# Patient Record
Sex: Male | Born: 1975 | Race: Black or African American | Hispanic: No | Smoking: Current every day smoker
Health system: Southern US, Community
[De-identification: ages and names within clinical notes are randomized; demographics above are authoritative.]

## PROBLEM LIST (undated history)

## (undated) ENCOUNTER — Telehealth

## (undated) ENCOUNTER — Telehealth: Attending: Family | Primary: Family

## (undated) ENCOUNTER — Encounter

## (undated) ENCOUNTER — Ambulatory Visit: Payer: MEDICARE

## (undated) ENCOUNTER — Ambulatory Visit

## (undated) ENCOUNTER — Encounter: Payer: MEDICAID | Attending: Family | Primary: Family

## (undated) ENCOUNTER — Encounter: Attending: Family | Primary: Family

## (undated) ENCOUNTER — Encounter: Attending: Internal Medicine | Primary: Internal Medicine

## (undated) ENCOUNTER — Inpatient Hospital Stay

## (undated) ENCOUNTER — Telehealth
Attending: Student in an Organized Health Care Education/Training Program | Primary: Student in an Organized Health Care Education/Training Program

## (undated) ENCOUNTER — Ambulatory Visit
Payer: MEDICAID | Attending: Student in an Organized Health Care Education/Training Program | Primary: Student in an Organized Health Care Education/Training Program

## (undated) ENCOUNTER — Ambulatory Visit: Payer: MEDICAID

## (undated) ENCOUNTER — Ambulatory Visit: Payer: MEDICARE | Attending: Family | Primary: Family

## (undated) ENCOUNTER — Telehealth: Attending: Registered" | Primary: Registered"

## (undated) ENCOUNTER — Ambulatory Visit: Payer: MEDICARE | Attending: Cardiovascular Disease | Primary: Cardiovascular Disease

## (undated) ENCOUNTER — Other Ambulatory Visit

## (undated) ENCOUNTER — Telehealth
Attending: Pharmacist Clinician (PhC)/ Clinical Pharmacy Specialist | Primary: Pharmacist Clinician (PhC)/ Clinical Pharmacy Specialist

## (undated) ENCOUNTER — Ambulatory Visit: Payer: MEDICAID | Attending: Pharmacist | Primary: Pharmacist

## (undated) ENCOUNTER — Telehealth: Attending: Internal Medicine | Primary: Internal Medicine

## (undated) ENCOUNTER — Ambulatory Visit: Payer: MEDICARE | Attending: Nurse Practitioner | Primary: Nurse Practitioner

## (undated) ENCOUNTER — Telehealth: Attending: Clinical | Primary: Clinical

## (undated) ENCOUNTER — Encounter: Payer: PRIVATE HEALTH INSURANCE | Attending: Family | Primary: Family

## (undated) ENCOUNTER — Ambulatory Visit: Payer: MEDICAID | Attending: Pharmacotherapy | Primary: Pharmacotherapy

## (undated) ENCOUNTER — Ambulatory Visit: Payer: MEDICAID | Attending: Family | Primary: Family

## (undated) ENCOUNTER — Encounter
Attending: Pharmacist Clinician (PhC)/ Clinical Pharmacy Specialist | Primary: Pharmacist Clinician (PhC)/ Clinical Pharmacy Specialist

## (undated) ENCOUNTER — Encounter
Payer: MEDICAID | Attending: Student in an Organized Health Care Education/Training Program | Primary: Student in an Organized Health Care Education/Training Program

## (undated) ENCOUNTER — Ambulatory Visit: Payer: MEDICARE | Attending: Internal Medicine | Primary: Internal Medicine

## (undated) ENCOUNTER — Ambulatory Visit
Payer: MEDICARE | Attending: Rehabilitative and Restorative Service Providers" | Primary: Rehabilitative and Restorative Service Providers"

## (undated) ENCOUNTER — Encounter: Attending: Cardiovascular Disease | Primary: Cardiovascular Disease

## (undated) DIAGNOSIS — B20 Human immunodeficiency virus [HIV] disease: Secondary | ICD-10-CM

## (undated) DIAGNOSIS — Z21 Asymptomatic human immunodeficiency virus [HIV] infection status: Secondary | ICD-10-CM

## (undated) DIAGNOSIS — I639 Cerebral infarction, unspecified: Secondary | ICD-10-CM

## (undated) DIAGNOSIS — I358 Other nonrheumatic aortic valve disorders: Secondary | ICD-10-CM

## (undated) DIAGNOSIS — Z952 Presence of prosthetic heart valve: Secondary | ICD-10-CM

## (undated) HISTORY — PX: PACEMAKER IMPLANT: EP1218

## (undated) HISTORY — PX: CARDIAC VALVE SURGERY: SHX40

## (undated) MED ORDER — ASPIRIN 81 MG TABLET,DELAYED RELEASE: Freq: Every day | ORAL | 0.00000 days

## (undated) MED ORDER — TIVICAY 50 MG TABLET: 50 mg | tablet | Freq: Two times a day (BID) | 2 refills | 30 days | Status: CN

## (undated) MED ORDER — TIVICAY 50 MG TABLET: 50 mg | tablet | Freq: Every day | 2 refills | 60 days | Status: CN

---

## 2018-01-22 DIAGNOSIS — I442 Atrioventricular block, complete: Secondary | ICD-10-CM

## 2018-01-22 HISTORY — DX: Atrioventricular block, complete: I44.2

## 2018-09-01 ENCOUNTER — Ambulatory Visit: Admit: 2018-09-01 | Discharge: 2018-09-01 | Disposition: A | Discharge status: Home / Self Care | Payer: MEDICAID

## 2018-09-01 MED ORDER — METHOCARBAMOL 500 MG TABLET
ORAL_TABLET | Freq: Two times a day (BID) | ORAL | 0 refills | 10 days | Status: CP | PRN
Start: 2018-09-01 — End: ?

## 2018-09-01 MED ORDER — NAPROXEN 500 MG TABLET
ORAL_TABLET | Freq: Two times a day (BID) | ORAL | 0 refills | 10 days | Status: CP
Start: 2018-09-01 — End: 2018-09-11

## 2019-05-09 ENCOUNTER — Inpatient Hospital Stay
Admission: EM | Admit: 2019-05-09 | Discharge: 2019-05-25 | DRG: 314 | Disposition: A | Discharge status: Other Acute Inpatient Hospital | Payer: Medicaid Other | Attending: Family Medicine | Admitting: Family Medicine

## 2019-05-09 ENCOUNTER — Emergency Department: Payer: Medicaid Other

## 2019-05-09 ENCOUNTER — Other Ambulatory Visit: Payer: Self-pay

## 2019-05-09 ENCOUNTER — Ambulatory Visit
Admit: 2019-05-09 | Discharge: 2019-05-09 | Disposition: A | Discharge status: Home / Self Care | Payer: MEDICAID | Attending: Family Medicine

## 2019-05-09 DIAGNOSIS — Z8619 Personal history of other infectious and parasitic diseases: Secondary | ICD-10-CM | POA: Diagnosis not present

## 2019-05-09 DIAGNOSIS — N179 Acute kidney failure, unspecified: Secondary | ICD-10-CM | POA: Diagnosis present

## 2019-05-09 DIAGNOSIS — D72825 Bandemia: Secondary | ICD-10-CM

## 2019-05-09 DIAGNOSIS — R7401 Elevation of levels of liver transaminase levels: Secondary | ICD-10-CM | POA: Diagnosis present

## 2019-05-09 DIAGNOSIS — Z21 Asymptomatic human immunodeficiency virus [HIV] infection status: Secondary | ICD-10-CM | POA: Diagnosis present

## 2019-05-09 DIAGNOSIS — Z818 Family history of other mental and behavioral disorders: Secondary | ICD-10-CM

## 2019-05-09 DIAGNOSIS — F17210 Nicotine dependence, cigarettes, uncomplicated: Secondary | ICD-10-CM | POA: Diagnosis present

## 2019-05-09 DIAGNOSIS — B9561 Methicillin susceptible Staphylococcus aureus infection as the cause of diseases classified elsewhere: Secondary | ICD-10-CM | POA: Diagnosis not present

## 2019-05-09 DIAGNOSIS — A630 Anogenital (venereal) warts: Secondary | ICD-10-CM | POA: Diagnosis present

## 2019-05-09 DIAGNOSIS — Z9114 Patient's other noncompliance with medication regimen: Secondary | ICD-10-CM | POA: Diagnosis not present

## 2019-05-09 DIAGNOSIS — F419 Anxiety disorder, unspecified: Secondary | ICD-10-CM | POA: Diagnosis present

## 2019-05-09 DIAGNOSIS — Z8661 Personal history of infections of the central nervous system: Secondary | ICD-10-CM

## 2019-05-09 DIAGNOSIS — R778 Other specified abnormalities of plasma proteins: Secondary | ICD-10-CM | POA: Diagnosis present

## 2019-05-09 DIAGNOSIS — A419 Sepsis, unspecified organism: Secondary | ICD-10-CM | POA: Diagnosis present

## 2019-05-09 DIAGNOSIS — Y831 Surgical operation with implant of artificial internal device as the cause of abnormal reaction of the patient, or of later complication, without mention of misadventure at the time of the procedure: Secondary | ICD-10-CM | POA: Diagnosis present

## 2019-05-09 DIAGNOSIS — I959 Hypotension, unspecified: Secondary | ICD-10-CM | POA: Diagnosis not present

## 2019-05-09 DIAGNOSIS — R011 Cardiac murmur, unspecified: Secondary | ICD-10-CM | POA: Diagnosis not present

## 2019-05-09 DIAGNOSIS — R7881 Bacteremia: Secondary | ICD-10-CM | POA: Diagnosis present

## 2019-05-09 DIAGNOSIS — Z950 Presence of cardiac pacemaker: Secondary | ICD-10-CM | POA: Diagnosis not present

## 2019-05-09 DIAGNOSIS — Z20822 Contact with and (suspected) exposure to covid-19: Secondary | ICD-10-CM | POA: Diagnosis present

## 2019-05-09 DIAGNOSIS — E871 Hypo-osmolality and hyponatremia: Secondary | ICD-10-CM | POA: Diagnosis present

## 2019-05-09 DIAGNOSIS — A4101 Sepsis due to Methicillin susceptible Staphylococcus aureus: Secondary | ICD-10-CM | POA: Diagnosis present

## 2019-05-09 DIAGNOSIS — F4321 Adjustment disorder with depressed mood: Secondary | ICD-10-CM | POA: Diagnosis present

## 2019-05-09 DIAGNOSIS — T826XXA Infection and inflammatory reaction due to cardiac valve prosthesis, initial encounter: Principal | ICD-10-CM | POA: Diagnosis present

## 2019-05-09 DIAGNOSIS — F1490 Cocaine use, unspecified, uncomplicated: Secondary | ICD-10-CM | POA: Diagnosis present

## 2019-05-09 DIAGNOSIS — Z9889 Other specified postprocedural states: Secondary | ICD-10-CM | POA: Diagnosis not present

## 2019-05-09 DIAGNOSIS — I77810 Thoracic aortic ectasia: Secondary | ICD-10-CM | POA: Diagnosis present

## 2019-05-09 DIAGNOSIS — I5022 Chronic systolic (congestive) heart failure: Secondary | ICD-10-CM | POA: Diagnosis present

## 2019-05-09 DIAGNOSIS — F329 Major depressive disorder, single episode, unspecified: Secondary | ICD-10-CM | POA: Diagnosis not present

## 2019-05-09 DIAGNOSIS — B20 Human immunodeficiency virus [HIV] disease: Secondary | ICD-10-CM | POA: Diagnosis not present

## 2019-05-09 DIAGNOSIS — I38 Endocarditis, valve unspecified: Secondary | ICD-10-CM | POA: Diagnosis present

## 2019-05-09 DIAGNOSIS — R748 Abnormal levels of other serum enzymes: Secondary | ICD-10-CM | POA: Diagnosis present

## 2019-05-09 DIAGNOSIS — R6520 Severe sepsis without septic shock: Secondary | ICD-10-CM

## 2019-05-09 DIAGNOSIS — D72829 Elevated white blood cell count, unspecified: Secondary | ICD-10-CM | POA: Diagnosis present

## 2019-05-09 DIAGNOSIS — Z8679 Personal history of other diseases of the circulatory system: Secondary | ICD-10-CM | POA: Diagnosis not present

## 2019-05-09 DIAGNOSIS — I330 Acute and subacute infective endocarditis: Secondary | ICD-10-CM | POA: Diagnosis present

## 2019-05-09 DIAGNOSIS — R6521 Severe sepsis with septic shock: Secondary | ICD-10-CM | POA: Diagnosis present

## 2019-05-09 DIAGNOSIS — Z8249 Family history of ischemic heart disease and other diseases of the circulatory system: Secondary | ICD-10-CM

## 2019-05-09 DIAGNOSIS — Z8673 Personal history of transient ischemic attack (TIA), and cerebral infarction without residual deficits: Secondary | ICD-10-CM

## 2019-05-09 DIAGNOSIS — Z952 Presence of prosthetic heart valve: Secondary | ICD-10-CM | POA: Diagnosis not present

## 2019-05-09 DIAGNOSIS — F17200 Nicotine dependence, unspecified, uncomplicated: Secondary | ICD-10-CM | POA: Diagnosis not present

## 2019-05-09 HISTORY — DX: Cerebral infarction, unspecified: I63.9

## 2019-05-09 LAB — URINALYSIS, COMPLETE (UACMP) WITH MICROSCOPIC
Bilirubin Urine: NEGATIVE
Glucose, UA: NEGATIVE mg/dL
Hgb urine dipstick: NEGATIVE
Ketones, ur: NEGATIVE mg/dL
Leukocytes,Ua: NEGATIVE
Nitrite: NEGATIVE
Protein, ur: NEGATIVE mg/dL
Specific Gravity, Urine: 1.009 (ref 1.005–1.030)
pH: 7 (ref 5.0–8.0)

## 2019-05-09 LAB — BASIC METABOLIC PANEL
Anion gap: 10 (ref 5–15)
BUN: 18 mg/dL (ref 6–20)
CO2: 18 mmol/L — ABNORMAL LOW (ref 22–32)
Calcium: 9 mg/dL (ref 8.9–10.3)
Chloride: 103 mmol/L (ref 98–111)
Creatinine, Ser: 1.86 mg/dL — ABNORMAL HIGH (ref 0.61–1.24)
GFR calc Af Amer: 51 mL/min — ABNORMAL LOW (ref >60)
GFR calc non Af Amer: 44 mL/min — ABNORMAL LOW (ref >60)
Glucose, Bld: 121 mg/dL — ABNORMAL HIGH (ref 70–99)
Potassium: 4.3 mmol/L (ref 3.5–5.1)
Sodium: 131 mmol/L — ABNORMAL LOW (ref 135–145)

## 2019-05-09 LAB — CBC
HCT: 42.1 % (ref 39.0–52.0)
Hemoglobin: 13.9 g/dL (ref 13.0–17.0)
MCH: 29.2 pg (ref 26.0–34.0)
MCHC: 33 g/dL (ref 30.0–36.0)
MCV: 88.4 fL (ref 80.0–100.0)
Platelets: 200 10*3/uL (ref 150–400)
RBC: 4.76 MIL/uL (ref 4.22–5.81)
RDW: 11.9 % (ref 11.5–15.5)
WBC: 15.8 10*3/uL — ABNORMAL HIGH (ref 4.0–10.5)
nRBC: 0 % (ref 0.0–0.2)

## 2019-05-09 LAB — COMPREHENSIVE METABOLIC PANEL
ALT: 47 U/L — ABNORMAL HIGH (ref 0–44)
AST: 43 U/L — ABNORMAL HIGH (ref 15–41)
Albumin: 3 g/dL — ABNORMAL LOW (ref 3.5–5.0)
Alkaline Phosphatase: 60 U/L (ref 38–126)
Anion gap: 5 (ref 5–15)
BUN: 16 mg/dL (ref 6–20)
CO2: 20 mmol/L — ABNORMAL LOW (ref 22–32)
Calcium: 7.4 mg/dL — ABNORMAL LOW (ref 8.9–10.3)
Chloride: 110 mmol/L (ref 98–111)
Creatinine, Ser: 1.69 mg/dL — ABNORMAL HIGH (ref 0.61–1.24)
GFR calc Af Amer: 57 mL/min — ABNORMAL LOW (ref >60)
GFR calc non Af Amer: 49 mL/min — ABNORMAL LOW (ref >60)
Glucose, Bld: 114 mg/dL — ABNORMAL HIGH (ref 70–99)
Potassium: 4.1 mmol/L (ref 3.5–5.1)
Sodium: 135 mmol/L (ref 135–145)
Total Bilirubin: 1 mg/dL (ref 0.3–1.2)
Total Protein: 6.4 g/dL — ABNORMAL LOW (ref 6.5–8.1)

## 2019-05-09 LAB — TROPONIN I (HIGH SENSITIVITY)
Troponin I (High Sensitivity): 19 ng/L — ABNORMAL HIGH (ref <18)
Troponin I (High Sensitivity): 14 ng/L (ref <18)

## 2019-05-09 LAB — RESPIRATORY PANEL BY RT PCR (FLU A&B, COVID)
Influenza A by PCR: NEGATIVE
Influenza B by PCR: NEGATIVE
SARS Coronavirus 2 by RT PCR: NEGATIVE

## 2019-05-09 LAB — LACTIC ACID, PLASMA: Lactic Acid, Venous: 1.3 mmol/L (ref 0.5–1.9)

## 2019-05-09 LAB — PROCALCITONIN: Procalcitonin: 17.78 ng/mL

## 2019-05-09 MED ORDER — VANCOMYCIN HCL 2000 MG/400ML IV SOLN
2000.0000 mg | Freq: Once | INTRAVENOUS | Status: AC
  Administered 2019-05-09: 2000 mg via INTRAVENOUS
  Filled 2019-05-09: qty 400

## 2019-05-09 MED ORDER — HEPARIN SODIUM (PORCINE) 5000 UNIT/ML IJ SOLN: 5000.0000 [IU] | Freq: Three times a day (TID) | INTRAMUSCULAR | Status: DC

## 2019-05-09 MED ORDER — ACETAMINOPHEN 325 MG PO TABS
650.0000 mg | ORAL_TABLET | Freq: Four times a day (QID) | ORAL | Status: DC | PRN
  Administered 2019-05-09 – 2019-05-23 (×16): 650 mg via ORAL
  Filled 2019-05-09 (×17): qty 2

## 2019-05-09 MED ORDER — SODIUM CHLORIDE 0.9 % IV SOLN
2.0000 g | Freq: Once | INTRAVENOUS | Status: AC
  Administered 2019-05-09: 2 g via INTRAVENOUS
  Filled 2019-05-09: qty 2

## 2019-05-09 MED ORDER — ONDANSETRON HCL 4 MG/2ML IJ SOLN: 4.0000 mg | Freq: Four times a day (QID) | INTRAMUSCULAR | Status: DC | PRN

## 2019-05-09 MED ORDER — LACTATED RINGERS IV SOLN: INTRAVENOUS | Status: DC

## 2019-05-09 MED ORDER — ONDANSETRON HCL 4 MG PO TABS: 4.0000 mg | ORAL_TABLET | Freq: Four times a day (QID) | ORAL | Status: DC | PRN

## 2019-05-09 MED ORDER — SODIUM CHLORIDE 0.9 % IV SOLN
1000.0000 mL | INTRAVENOUS | Status: DC
  Administered 2019-05-09: 15:00:00 1000 mL via INTRAVENOUS

## 2019-05-09 MED ORDER — LOPERAMIDE HCL 1 MG/5ML PO LIQD: 2.0000 mg | ORAL | 5 refills | Status: DC | PRN

## 2019-05-09 MED ORDER — SODIUM CHLORIDE 0.9 % IV SOLN: 2.0000 g | Freq: Three times a day (TID) | INTRAVENOUS | Status: DC

## 2019-05-09 MED ORDER — VANCOMYCIN HCL 1750 MG/350ML IV SOLN: 1750.0000 mg | INTRAVENOUS | Status: DC

## 2019-05-09 MED ORDER — HEPARIN SODIUM (PORCINE) 5000 UNIT/ML IJ SOLN
5000.0000 [IU] | Freq: Three times a day (TID) | INTRAMUSCULAR | Status: DC
  Administered 2019-05-09 – 2019-05-12 (×7): 5000 [IU] via SUBCUTANEOUS
  Filled 2019-05-09 (×10): qty 1

## 2019-05-09 MED ORDER — ACETAMINOPHEN 650 MG RE SUPP: 650.0000 mg | Freq: Four times a day (QID) | RECTAL | Status: DC | PRN

## 2019-05-09 MED ORDER — SODIUM CHLORIDE 0.9 % IV BOLUS (SEPSIS)
1000.0000 mL | Freq: Once | INTRAVENOUS | Status: AC
  Administered 2019-05-09: 1000 mL via INTRAVENOUS

## 2019-05-09 NOTE — Plan of Care (Signed)
Patient admitted to floor, alert and oriented x4. Oriented to room, POC, and call bell and placed within reach.  Patient verbalized understanding, family at bedside.

## 2019-05-09 NOTE — Progress Notes (Signed)
Pharmacy Antibiotic Note  James Ferrell is a 44 y.o. male admitted on 05/09/2019 with sepsis.  Pharmacy has been consulted for Vancomycin, Cefepime dosing.  Plan: Cefepime 2 gm IV X 1 ordered for 4/17 @ 1422. Cefepime 2 gm IV Q8H ordered to start on 4/17 @ 2200.   Vancomycin 2 gm IV X 1 ordered to be given on 4/17 @ ~ 2000. Vancomycin 1750 mg IV Q24H @ 4/18 @ 2000.   Vd = 60.4 L Ke = 0.055 hr-1 T1/2 = 12.5 hrs AUC = 524.3 Vanc trough = 11.7   Height: 5\' 11"  (180.3 cm) Weight: 83.9 kg (185 lb) IBW/kg (Calculated) : 75.3  Temp (24hrs), Avg:98.7 F (37.1 C), Min:98.7 F (37.1 C), Max:98.7 F (37.1 C)  Recent Labs  Lab 05/09/19 1309 05/09/19 1332 05/09/19 1600  WBC 15.8*  --   --   CREATININE 1.86*  --  1.69*  LATICACIDVEN  --  1.3  --     Estimated Creatinine Clearance: 61.3 mL/min (A) (by C-G formula based on SCr of 1.69 mg/dL (H)).    No Known Allergies  Antimicrobials this admission:   >>    >>   Dose adjustments this admission:   Microbiology results:  BCx:   UCx:    Sputum:    MRSA PCR:   Thank you for allowing pharmacy to be a part of this patient's care.  Bahar Shelden D 05/09/2019 6:48 PM

## 2019-05-09 NOTE — ED Provider Notes (Signed)
Procedures     ----------------------------------------- 4:49 PM on 05/09/2019 -----------------------------------------  Sepsis reassessment has been completed after 52ml/kg fluid bolus infused.    BP 115/55, MAP 70. HR 110. Hemodynamics improved, no signs of shock currently. Vasopressors not indicated at present time.     Sharman Cheek, MD 05/09/19 1650

## 2019-05-09 NOTE — ED Notes (Signed)
Report given to Marion, RN.

## 2019-05-09 NOTE — H&P (Addendum)
History and Physical:    James Ferrell   LKG:[id number] DOB: Jan 05, 1976 DOA: 05/09/2019  Referring MD/provider:  PCP: Patient, No Pcp Per   Patient coming from: Home  Chief Complaint: Fever and chills  History of Present Illness:   James Ferrell is an 44 y.o. male  with medical history significant for stroke (occured during during heart valve surgery) and heart valve surgery (does not know which valve) who presented to the hospital with fever and chills.  His symptoms started few days ago but has progressively worsened.  He developed generalized weakness and dizziness and he felt as though he was going to fall down.  He said he was also exposed to some cleaning chemicals yesterday when he tried to do some cleaning in the bathroom.  He went to [facility] today where his temperature was 103 in the emergency department.  However, because of long waiting times in the emergency department at [facility], he came to [facility] for further evaluation.  No vomiting, diarrhea, abdominal pain, urinary symptoms, cough, shortness of breath, chest pain, wheezing, sore throat.  No animal, insect or tick bites.  He said he was living in [place], [place] recently located to [facility]  ED Course:  The patient was tachypneic, tachycardic and hypotensive in the emergency room.  BP was 87/46 and heart rate was in the 120s.  Temperature was normal in the ED.  He was given liters of normal saline and IV cefepime in the emergency room.  BP has improved a little bit after IV fluids.  Chest x-ray did not show any acute abnormality and urinalysis was unremarkable.  WBC is 15.8 but lactic acid was normal     ROS:   ROS all other systems reviewed were negative  Past Medical History:   Past Medical History:  Diagnosis Date  . Stroke [facility])     Past Surgical History:   Past Surgical History:  Procedure Laterality Date  . CARDIAC VALVE SURGERY      Social History:    Social History   Socioeconomic History  . Marital status: Significant Other    Spouse name: Not on file  . Number of children: Not on file  . Years of education: Not on file  . Highest education level: Not on file  Occupational History  . Not on file  Tobacco Use  . Smoking status: Current Every Day Smoker  . Smokeless tobacco: Never Used  Substance and Sexual Activity  . Alcohol use: Yes  . Drug use: Not on file  . Sexual activity: Not on file  Other Topics Concern  . Not on file  Social History Narrative  . Not on file   Social Determinants of Health   Financial Resource Strain:   . Difficulty of Paying Living Expenses:   Food Insecurity:   . Worried About Charity fundraiser in the Last Year:   . Arboriculturist in the Last Year:   Transportation Needs:   . Film/video editor (Medical):   Marland Kitchen Lack of Transportation (Non-Medical):   Physical Activity:   . Days of Exercise per Week:   . Minutes of Exercise per Session:   Stress:   . Feeling of Stress :   Social Connections:   . Frequency of Communication with Friends and Family:   . Frequency of Social Gatherings with Friends and Family:   . Attends Religious Services:   . Active Member of Clubs or Organizations:   .  Attends Banker Meetings:   Marland Kitchen Marital Status:   Intimate Partner Violence:   . Fear of Current or Ex-Partner:   . Emotionally Abused:   Marland Kitchen Physically Abused:   . Sexually Abused:     Allergies   Patient has no known allergies.  Family history:   Family History  Problem Relation Age of Onset  . Heart disease Neg Hx     Current Medications:   Prior to Admission medications   Not on File    Physical Exam:   Vitals:   05/09/19 1600 05/09/19 1630 05/09/19 1710 05/09/19 1730  BP:  (!) 115/53  (!) 117/45  Pulse:      Resp: (!) 23 (!) 27 (!) 32   Temp:      TempSrc:      SpO2:      Weight:      Height:         Physical Exam: Blood pressure (!) 117/45, pulse  (!) 120, temperature 98.7 F (37.1 C), temperature source Oral, resp. rate (!) 32, height 5\' 11"  (1.803 m), weight 83.9 kg, SpO2 95 %. Gen: No acute distress but he is shivering. Head: Normocephalic, atraumatic. Eyes: Pupils equal, round and reactive to light. Extraocular movements intact.  Sclerae nonicteric.  Mouth: Moist mucous membranes. Dentition is good.  Torus on roof of the mouth Neck: Supple, no thyromegaly, no lymphadenopathy, no jugular venous distention. Chest: Lungs are clear to auscultation with good air movement. No rales, rhonchi or wheezes.  CV: Heart sounds are regular with an S1, S2. No murmurs, rubs or gallops.  Abdomen: Soft, nontender, nondistended with normal active bowel sounds. No palpable masses. Extremities: Extremities are without clubbing, or cyanosis. No edema. Pedal pulses 2+.  Skin: Warm and dry. No rashes, lesions or wounds.  He has a surgical scar on the midline chest. Neuro: Alert and oriented times 3; grossly nonfocal.  Psych: Insight is good and judgment is appropriate. Mood and affect normal.   Data Review:    Labs: Basic Metabolic Panel: Recent Labs  Lab 05/09/19 1309 05/09/19 1600  NA 131* 135  K 4.3 4.1  CL 103 110  CO2 18* 20*  GLUCOSE 121* 114*  BUN 18 16  CREATININE 1.86* 1.69*  CALCIUM 9.0 7.4*   Liver Function Tests: Recent Labs  Lab 05/09/19 1600  AST 43*  ALT 47*  ALKPHOS 60  BILITOT 1.0  PROT 6.4*  ALBUMIN 3.0*   No results for input(s): LIPASE, AMYLASE in the last 168 hours. No results for input(s): AMMONIA in the last 168 hours. CBC: Recent Labs  Lab 05/09/19 1309  WBC 15.8*  HGB 13.9  HCT 42.1  MCV 88.4  PLT 200   Cardiac Enzymes: No results for input(s): CKTOTAL, CKMB, CKMBINDEX, TROPONINI in the last 168 hours.  BNP (last 3 results) No results for input(s): PROBNP in the last 8760 hours. CBG: No results for input(s): GLUCAP in the last 168 hours.  Urinalysis    Component Value Date/Time    COLORURINE YELLOW (A) 05/09/2019 1516   APPEARANCEUR CLEAR (A) 05/09/2019 1516   LABSPEC 1.009 05/09/2019 1516   PHURINE 7.0 05/09/2019 1516   GLUCOSEU NEGATIVE 05/09/2019 1516   HGBUR NEGATIVE 05/09/2019 1516   BILIRUBINUR NEGATIVE 05/09/2019 1516   KETONESUR NEGATIVE 05/09/2019 1516   PROTEINUR NEGATIVE 05/09/2019 1516   NITRITE NEGATIVE 05/09/2019 1516   LEUKOCYTESUR NEGATIVE 05/09/2019 1516      Radiographic Studies: DG Chest 1 View  Result Date: 05/09/2019  CLINICAL DATA:  Fever, weakness. EXAM: CHEST  1 VIEW COMPARISON:  None. FINDINGS: The heart size and mediastinal contours are within normal limits. No pneumothorax or pleural effusion is noted. Left-sided pacemaker is noted. Sternotomy wires are noted. Both lungs are clear. The visualized skeletal structures are unremarkable. IMPRESSION: No active disease. Electronically Signed   By: Lupita Raider M.D.   On: 05/09/2019 14:44      Assessment/Plan:   Principal Problem:   Sepsis (HCC) Active Problems:   Leukocytosis   Hypotension   AKI (acute kidney injury) (HCC)  Sepsis/leukocytosis: Admit to MedSurg with cardiac monitoring.  Etiology of sepsis is unclear at this time.  Chest x-ray and urinalysis were unremarkable.  Obtain CT scan of the chest, abdomen and pelvis to look for occult infection when creatinine improves.  Check procalcitonin level.  Tylenol as needed for fever.  Continue IV fluids for hydration.  Treat with empiric IV antibiotics (cefepime and vancomycin).  Consulted pharmacist to assist with dosing of antibiotics.  Follow-up blood cultures.  Acute kidney injury: Continue IV fluids.  Baseline creatinine unknown.  Monitor kidney function.  HypOtension: Continue IV fluids.  Hyponatremia: Improving.  Continue IV fluids.  Mildly elevated liver enzymes: Monitor levels closely.  History of stroke: Patient is not on any medications for this  History of heart valve surgery  Body mass index is 25.8  kg/m.  Other information:   DVT prophylaxis: Heparin Code Status: Full code. Family Communication: Plan discussed with significant daughter, Tammy Disposition Plan: Home when medically stable Consults called: None Admission status: Inpatient  The medical decision making on this patient was of high complexity and the patient is at high risk for clinical deterioration, therefore this is a level 3 visit.   Time spent 72 minutes  Ammi Hutt [facility]   How to contact the [facility] Attending or Consulting provider 7A - 7P or covering provider during after hours 7P -7A, for this patient?   1. Check the care team in [facility] and look for a) attending/consulting TRH provider listed and b) the [facility] team listed 2. Log into [email or web address] and use [place]'s universal password to access. If you do not have the password, please contact the hospital operator. 3. Locate the [facility] provider you are looking for under [facility] and page to a number that you can be directly reached. 4. If you still have difficulty reaching the provider, please page the [facility] (Director on Call) for the Hospitalists listed on amion for assistance.  05/09/2019, 5:55 PM

## 2019-05-09 NOTE — ED Triage Notes (Signed)
Pt c/o fatigue and weakness since last night. hypotensive and tachycardic in triage. First RN stated pt was having trouble walking in lobby, placed in wheelchair.A&O, speaking in complete sentences. Stroke February 2020. States was waiting [facility] since [place] today.

## 2019-05-09 NOTE — ED Provider Notes (Signed)
[facility] Emergency Department Provider Note  ____________________________________________  Time seen: Approximately 1:21 PM  I have reviewed the triage vital signs and the nursing notes.   HISTORY  Chief Complaint Weakness    HPI James Ferrell is a 44 y.o. male who presents the emergency department complaining of weakness, dizziness, possible fever.  Patient states that over the past several days he has noticed that he possibly was running a low-grade subjective tactile fever.  Patient denied any other symptoms to accompanying this sensation however.  He denies any headache, visual changes, nasal congestion, sore throat, cough, shortness of breath, abdominal pain, nausea vomiting, diarrhea or constipation.  No dysuria, polyuria or hematuria.  Patient states that his main concern is he was cleaning his tub yesterday he started to feel lightheaded and dizzy.  Patient initially thought he had inhaled some of the chemicals and that thinking of it.  This morning he awoke with increased dizziness and weakness.  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  Patient denies any residual weakness from same.  No new onset of focal weakness unilaterally.  Again no headaches, visual changes.  No head trauma.  No medications prior to arrival.   Patient states that he went to a [facility] affiliated ED earlier today.  Patient had 103.55F temperature.  Patient states that given the wait time he left that emergency department proceeded to our emergency department without evaluation from formal ED.        Past Medical History:  Diagnosis Date  . Stroke [facility])     Patient Active Problem List   Diagnosis Date Noted  . Sepsis (HCC) 05/09/2019    History reviewed. No pertinent surgical history.  Prior to Admission medications   Not on File    Allergies Patient has no known allergies.  History reviewed. No pertinent family  history.  Social History Social History   Tobacco Use  . Smoking status: Current Every Day Smoker  Substance Use Topics  . Alcohol use: Yes  . Drug use: Not on file     Review of Systems  Constitutional: Subjective, tactile fever/chills.  Positive for weakness and dizziness today. Eyes: No visual changes. No discharge ENT: No upper respiratory complaints. Cardiovascular: no chest pain. Respiratory: no cough. No SOB. Gastrointestinal: No abdominal pain.  No nausea, no vomiting.  No diarrhea.  No constipation. Genitourinary: Negative for dysuria. No hematuria Musculoskeletal: Negative for musculoskeletal pain. Skin: Negative for rash, abrasions, lacerations, ecchymosis. Neurological: Negative for headaches, focal weakness or numbness. 10-point ROS otherwise negative.  ____________________________________________   PHYSICAL EXAM:  VITAL SIGNS: ED Triage Vitals  Enc Vitals Group     BP 05/09/19 1304 (!) 87/46     Pulse Rate 05/09/19 1304 (!) 120     Resp 05/09/19 1304 18     Temp 05/09/19 1304 98.7 F (37.1 C)     Temp Source 05/09/19 1304 Oral     SpO2 05/09/19 1304 95 %     Weight 05/09/19 1305 185 lb (83.9 kg)     Height 05/09/19 1305 5\' 11"  (1.803 m)     Head Circumference --      Peak Flow --      Pain Score 05/09/19 1304 0     Pain Loc --      Pain Edu? --      Excl. in GC? --      Constitutional: Alert and oriented. Well appearing  and in no acute distress. Eyes: Conjunctivae are normal. PERRL. EOMI. Head: Atraumatic. ENT:      Ears: EACs and TMs unremarkable bilaterally.      Nose: Minimal clear congestion/rhinnorhea.      Mouth/Throat: Mucous membranes are moist.  No gross oropharyngeal erythema or edema Neck: No stridor.  Neck is supple full range of motion. Hematological/Lymphatic/Immunilogical: No cervical lymphadenopathy. Cardiovascular: Normal rate, regular rhythm. Normal S1 and S2.  Good peripheral circulation. Respiratory: Normal respiratory  effort without tachypnea or retractions. Lungs CTAB. Good air entry to the bases with no decreased or absent breath sounds. Gastrointestinal: Bowel sounds 4 quadrants. Soft and nontender to palpation. No guarding or rigidity. No palpable masses. No distention. No CVA tenderness. Musculoskeletal: Full range of motion to all extremities. No gross deformities appreciated. Neurologic:  Normal speech and language. No gross focal neurologic deficits are appreciated.  Skin:  Skin is warm, dry and intact. No rash noted. Psychiatric: Mood and affect are normal. Speech and behavior are normal. Patient exhibits appropriate insight and judgement.   ____________________________________________   LABS (all labs ordered are listed, but only abnormal results are displayed)  Labs Reviewed  BASIC METABOLIC PANEL - Abnormal; Notable for the following components:      Result Value   Sodium 131 (*)    CO2 18 (*)    Glucose, Bld 121 (*)    Creatinine, Ser 1.86 (*)    GFR calc non Af Amer 44 (*)    GFR calc Af Amer 51 (*)    All other components within normal limits  CBC - Abnormal; Notable for the following components:   WBC 15.8 (*)    All other components within normal limits  URINALYSIS, COMPLETE (UACMP) WITH MICROSCOPIC - Abnormal; Notable for the following components:   Color, Urine YELLOW (*)    APPearance CLEAR (*)    Bacteria, UA RARE (*)    All other components within normal limits  COMPREHENSIVE METABOLIC PANEL - Abnormal; Notable for the following components:   CO2 20 (*)    Glucose, Bld 114 (*)    Creatinine, Ser 1.69 (*)    Calcium 7.4 (*)    Total Protein 6.4 (*)    Albumin 3.0 (*)    AST 43 (*)    ALT 47 (*)    GFR calc non Af Amer 49 (*)    GFR calc Af Amer 57 (*)    All other components within normal limits  TROPONIN I (HIGH SENSITIVITY) - Abnormal; Notable for the following components:   Troponin I (High Sensitivity) 19 (*)    All other components within normal limits   RESPIRATORY PANEL BY RT PCR (FLU A&B, COVID)  CULTURE, BLOOD (ROUTINE X 2)  CULTURE, BLOOD (ROUTINE X 2)  LACTIC ACID, PLASMA  PROCALCITONIN  PROCALCITONIN  CBG MONITORING, ED  TROPONIN I (HIGH SENSITIVITY)   ____________________________________________  EKG   ____________________________________________  RADIOLOGY I personally viewed and evaluated these images as part of my medical decision making, as well as reviewing the written report by the radiologist.  DG Chest 1 View  Result Date: 05/09/2019 CLINICAL DATA:  Fever, weakness. EXAM: CHEST  1 VIEW COMPARISON:  None. FINDINGS: The heart size and mediastinal contours are within normal limits. No pneumothorax or pleural effusion is noted. Left-sided pacemaker is noted. Sternotomy wires are noted. Both lungs are clear. The visualized skeletal structures are unremarkable. IMPRESSION: No active disease. Electronically Signed   By: Lupita Raider M.D.   On:  05/09/2019 14:44    ____________________________________________    PROCEDURES  Procedure(s) performed:    Procedures    Medications  sodium chloride 0.9 % bolus 1,000 mL (0 mLs Intravenous Stopped 05/09/19 1601)    Followed by  sodium chloride 0.9 % bolus 1,000 mL (0 mLs Intravenous Stopped 05/09/19 1510)    Followed by  sodium chloride 0.9 % bolus 1,000 mL (0 mLs Intravenous Stopped 05/09/19 1515)    Followed by  0.9 %  sodium chloride infusion (1,000 mLs Intravenous New Bag/Given 05/09/19 1516)  ceFEPIme (MAXIPIME) 2 g in sodium chloride 0.9 % 100 mL IVPB (0 g Intravenous Stopped 05/09/19 1510)     ____________________________________________   INITIAL IMPRESSION / ASSESSMENT AND PLAN / ED COURSE  Pertinent labs & imaging results that were available during my care of the patient were reviewed by me and considered in my medical decision making (see chart for details).  Review of the [place] CSRS was performed in accordance of the [place] prior to dispensing any  controlled drugs.    ED Sepsis - Repeat Assessment   Performed at: 05/09/2019 1455  Last Vitals:    Blood pressure 102/65, pulse (!) 120, temperature 98.7 F (37.1 C), temperature source Oral, resp. rate (!) 22, height 5\' 11"  (1.803 m), weight 83.9 kg, SpO2 95 %.  Heart:      No murmurs, rubs, gallops  Lungs:     CTAB  Capillary Refill:   <2sec  Peripheral Pulse (include location): 110 radial   Skin (include color):   WNL, no erythema        Patient's diagnosis is consistent with sepsis of unknown origin, elevated troponin.  Patient presented to the emergency department complaining of fever, weakness, dizziness.  Patient states that he had had a subjective tactile fever at home times several days.  Patient had not taken his temperature and did not take any medications for same either.  Yesterday he was cleaning his bathtub, thought that he had been exposed to cleaning chemicals for too long as he started to feel increasing he weak and dizzy.  Symptoms were worse this morning and patient initially presented to [facility] for evaluation.  Temperature there was 103 F.  Given the wait time patient left and presented to our emergency department.  Patient's temperature was within normal range on arrival but patient was hypotensive, tachycardic, tachypneic.  Initial white blood cell count was 15.8.  Lactic was reassuring..  Patient had no other specific symptoms to include URI symptoms, cough, shortness of breath, abdominal pain or dysuria.  Work-up is otherwise relatively reassuring.  Patient does have a history of heart valve surgery and during the surgery he did have a stroke.  Patient surgery was in [place] and I am unable to obtain the medical records from [place].  At this time patient did have an elevated troponin which did improve on redraw.  I will admit the patient for sepsis of unknown origin.  Hospital service accepts the patient at this time.   .     ____________________________________________  FINAL CLINICAL IMPRESSION(S) / ED DIAGNOSES  Final diagnoses:  Sepsis with acute organ dysfunction and septic shock, due to unspecified organism, unspecified type ([place])  Elevated troponin           This chart was dictated using voice recognition software/Dragon. Despite best efforts to proofread, errors can occur which can change the meaning. Any change was purely unintentional.    Darletta Moll, PA-C 05/09/19 1713  Jene Every, MD 05/10/19 1630

## 2019-05-10 DIAGNOSIS — R7881 Bacteremia: Secondary | ICD-10-CM | POA: Diagnosis present

## 2019-05-10 DIAGNOSIS — B9561 Methicillin susceptible Staphylococcus aureus infection as the cause of diseases classified elsewhere: Secondary | ICD-10-CM

## 2019-05-10 DIAGNOSIS — A4101 Sepsis due to Methicillin susceptible Staphylococcus aureus: Secondary | ICD-10-CM

## 2019-05-10 LAB — BLOOD CULTURE ID PANEL (REFLEXED)

## 2019-05-10 LAB — COMPREHENSIVE METABOLIC PANEL
ALT: 60 U/L — ABNORMAL HIGH (ref 0–44)
AST: 58 U/L — ABNORMAL HIGH (ref 15–41)
Albumin: 3.2 g/dL — ABNORMAL LOW (ref 3.5–5.0)
Alkaline Phosphatase: 65 U/L (ref 38–126)
Anion gap: 5 (ref 5–15)
BUN: 13 mg/dL (ref 6–20)
CO2: 21 mmol/L — ABNORMAL LOW (ref 22–32)
Calcium: 8.3 mg/dL — ABNORMAL LOW (ref 8.9–10.3)
Chloride: 109 mmol/L (ref 98–111)
Creatinine, Ser: 1.38 mg/dL — ABNORMAL HIGH (ref 0.61–1.24)
GFR calc Af Amer: >60 mL/min (ref >60)
GFR calc non Af Amer: >60 mL/min (ref >60)
Glucose, Bld: 116 mg/dL — ABNORMAL HIGH (ref 70–99)
Potassium: 3.6 mmol/L (ref 3.5–5.1)
Sodium: 135 mmol/L (ref 135–145)
Total Bilirubin: 1.6 mg/dL — ABNORMAL HIGH (ref 0.3–1.2)
Total Protein: 7.1 g/dL (ref 6.5–8.1)

## 2019-05-10 LAB — CBC
HCT: 37.9 % — ABNORMAL LOW (ref 39.0–52.0)
Hemoglobin: 12.4 g/dL — ABNORMAL LOW (ref 13.0–17.0)
MCH: 29.5 pg (ref 26.0–34.0)
MCHC: 32.7 g/dL (ref 30.0–36.0)
MCV: 90.2 fL (ref 80.0–100.0)
Platelets: 156 10*3/uL (ref 150–400)
RBC: 4.2 MIL/uL — ABNORMAL LOW (ref 4.22–5.81)
RDW: 12.3 % (ref 11.5–15.5)
WBC: 9.9 10*3/uL (ref 4.0–10.5)
nRBC: 0 % (ref 0.0–0.2)

## 2019-05-10 LAB — PROCALCITONIN: Procalcitonin: 21.77 ng/mL

## 2019-05-10 LAB — HIV ANTIBODY (ROUTINE TESTING W REFLEX): HIV Screen 4th Generation wRfx: REACTIVE — AB

## 2019-05-10 MED ORDER — CEFAZOLIN SODIUM-DEXTROSE 2-4 GM/100ML-% IV SOLN
2.0000 g | Freq: Three times a day (TID) | INTRAVENOUS | Status: DC
  Administered 2019-05-10 – 2019-05-17 (×22): 2 g via INTRAVENOUS
  Filled 2019-05-10 (×27): qty 100

## 2019-05-10 NOTE — Progress Notes (Signed)
Pharmacy Antibiotic Note  James Ferrell is a 44 y.o. male admitted on 05/09/2019 with sepsis and bacteremia.  Pharmacy has been consulted for Ancef  Plan: Ancef 2gm IV q8hrs  Height: 5\' 11"  (180.3 cm) Weight: 83.9 kg (185 lb) IBW/kg (Calculated) : 75.3  Temp (24hrs), Avg:100.4 F (38 C), Min:98.7 F (37.1 C), Max:103 F (39.4 C)  Recent Labs  Lab 05/09/19 1309 05/09/19 1332 05/09/19 1600  WBC 15.8*  --   --   CREATININE 1.86*  --  1.69*  LATICACIDVEN  --  1.3  --     Estimated Creatinine Clearance: 61.3 mL/min (A) (by C-G formula based on SCr of 1.69 mg/dL (H)).    No Known Allergies  Antimicrobials this admission:   >>    >>   Dose adjustments this admission:   Microbiology results:  BCx:   UCx:    Sputum:    MRSA PCR:   Thank you for allowing pharmacy to be a part of this patient's care.  05/11/19 A 05/10/2019 1:23 AM

## 2019-05-10 NOTE — Progress Notes (Addendum)
Progress Note    [name] Sunday  HYQ:[id number] DOB: 04/06/1975  DOA: 05/09/2019 PCP: Patient, No Pcp Per      Brief Narrative:    Medical records reviewed and are as summarized below:  James Ferrell is an 44 y.o. male with medical history significant for stroke (occured during during heart valve surgery) and heart valve surgery (does not know which valve) who presented to the hospital with fever and chills.  His symptoms started few days ago prior to admission but had progressively worsened.  He developed generalized weakness and dizziness and he felt as though he was going to fall down.  He said he was also exposed to some cleaning chemicals on the day prior to admission when he tried to do some cleaning in the bathroom.  He went to [facility] on the day of admission where his temperature was 103 F in the emergency department.  However, because of long waiting times in the emergency department at [facility], he came to [facility] for further evaluation. He was tachycardic and hypotensive in the emergency room and was given boluses of IV fluids.  He was initially diagnosed with sepsis of unknown etiology.  Blood culture grew MSSA.      Assessment/Plan:   Principal Problem:   Sepsis ([place]) Active Problems:   Bacteremia due to Staphylococcus aureus   Hypotension   AKI (acute kidney injury) ([place])   Leukocytosis   MSSA sepsis and bacteremia/leukocytosis: Antibiotics have been deescalated to IV Ancef.  2D echocardiogram has been ordered for further evaluation.  Tylenol as needed for fever.  Official ID consult tomorrow.  Acute kidney injury: Improving.  Continue IV fluids.  Hypotension: Resolved  Hyponatremia: Improved.  Mildly elevated liver enzymes: Liver enzymes are slightly worse today.  Continue to monitor.  History of stroke: Patient is not on any long-term medications for this  History of heart valve surgery: Specifics unknown.   2D echo has been ordered.    Body mass index is 25.8 kg/m.   Family Communication/Anticipated D/C date and plan/Code Status   DVT prophylaxis: Lovenox Code Status: Full code Family Communication: Plan discussed with patient Disposition Plan:    Status is: Inpatient  Remains inpatient appropriate because:IV treatments appropriate due to intensity of illness or inability to take PO and Inpatient level of care appropriate due to severity of illness   Dispo: The patient is from: Home              Anticipated d/c is to: Home              Anticipated d/c date is: > 3 days              Patient currently is not medically stable to d/c.            Subjective:   He still has a fever but chills have improved.  Overall, he feels a little better today.  Objective:    Vitals:   05/10/19 0406 05/10/19 0423 05/10/19 0528 05/10/19 0934  BP: 95/74  115/85 103/70  Pulse: (!) 118 (!) 109 (!) 111 (!) 104  Resp: 20 (!) 22 20 20   Temp: (!) 101 F (38.3 C)  (!) 101.4 F (38.6 C) 98.9 F (37.2 C)  TempSrc: Oral  Oral Oral  SpO2: 99%  97% 97%  Weight:      Height:        Intake/Output Summary (Last 24 hours) at 05/10/2019 1441 Last data  filed at 05/10/2019 3710 Gross per 24 hour  Intake 2257.05 ml  Output --  Net 2257.05 ml   Filed Weights   05/09/19 1305  Weight: 83.9 kg    Exam:  GEN: NAD SKIN: No rash EYES: EOMI ENT: MMM CV: RRR PULM: CTA B ABD: soft, ND, NT, +BS CNS: AAO x 3, non focal EXT: No edema or tenderness   Data Reviewed:   I have personally reviewed following labs and imaging studies:  Labs: Labs show the following:   Basic Metabolic Panel: Recent Labs  Lab 05/09/19 1309 05/09/19 1309 05/09/19 1600 05/10/19 0533  NA 131*  --  135 135  K 4.3   < > 4.1 3.6  CL 103  --  110 109  CO2 18*  --  20* 21*  GLUCOSE 121*  --  114* 116*  BUN 18  --  16 13  CREATININE 1.86*  --  1.69* 1.38*  CALCIUM 9.0  --  7.4* 8.3*   < > = values in  this interval not displayed.   GFR Estimated Creatinine Clearance: 75 mL/min (A) (by C-G formula based on SCr of 1.38 mg/dL (H)). Liver Function Tests: Recent Labs  Lab 05/09/19 1600 05/10/19 0533  AST 43* 58*  ALT 47* 60*  ALKPHOS 60 65  BILITOT 1.0 1.6*  PROT 6.4* 7.1  ALBUMIN 3.0* 3.2*   No results for input(s): LIPASE, AMYLASE in the last 168 hours. No results for input(s): AMMONIA in the last 168 hours. Coagulation profile No results for input(s): INR, PROTIME in the last 168 hours.  CBC: Recent Labs  Lab 05/09/19 1309 05/10/19 0533  WBC 15.8* 9.9  HGB 13.9 12.4*  HCT 42.1 37.9*  MCV 88.4 90.2  PLT 200 156   Cardiac Enzymes: No results for input(s): CKTOTAL, CKMB, CKMBINDEX, TROPONINI in the last 168 hours. BNP (last 3 results) No results for input(s): PROBNP in the last 8760 hours. CBG: No results for input(s): GLUCAP in the last 168 hours. D-Dimer: No results for input(s): DDIMER in the last 72 hours. Hgb A1c: No results for input(s): HGBA1C in the last 72 hours. Lipid Profile: No results for input(s): CHOL, HDL, LDLCALC, TRIG, CHOLHDL, LDLDIRECT in the last 72 hours. Thyroid function studies: No results for input(s): TSH, T4TOTAL, T3FREE, THYROIDAB in the last 72 hours.  Invalid input(s): FREET3 Anemia work up: No results for input(s): VITAMINB12, FOLATE, FERRITIN, TIBC, IRON, RETICCTPCT in the last 72 hours. Sepsis Labs: Recent Labs  Lab 05/09/19 1309 05/09/19 1332 05/09/19 1600 05/10/19 0533  PROCALCITON  --   --  17.78 21.77  WBC 15.8*  --   --  9.9  LATICACIDVEN  --  1.3  --   --     Microbiology Recent Results (from the past 240 hour(s))  Culture, blood (routine x 2)     Status: None (Preliminary result)   Collection Time: 05/09/19  1:10 PM   Specimen: BLOOD  Result Value Ref Range Status   Specimen Description BLOOD RFA  Final   Special Requests   Final    BOTTLES DRAWN AEROBIC AND ANAEROBIC Blood Culture results may not be optimal  due to an excessive volume of blood received in culture bottles   Culture  Setup Time   Final    Organism ID to follow IN BOTH AEROBIC AND ANAEROBIC BOTTLES GRAM POSITIVE COCCI CRITICAL RESULT CALLED TO, READ BACK BY AND VERIFIED WITH: SCOTT HALL ON 05/10/19 AT 0052 [facility] Performed at [facility], [address] [place], [place] [address]    Culture GRAM POSITIVE COCCI  Final   Report Status PENDING  Incomplete  Blood Culture ID Panel (Reflexed)     Status: Abnormal   Collection Time: 05/09/19  1:10 PM  Result Value Ref Range Status   Enterococcus species NOT DETECTED NOT DETECTED Final   Listeria monocytogenes NOT DETECTED NOT DETECTED Final   Staphylococcus species DETECTED (A) NOT DETECTED Final    Comment: CRITICAL RESULT CALLED TO, READ BACK BY AND VERIFIED WITH: SCOTT HALL ON 05/10/19 AT 0052 [facility]    Staphylococcus aureus (BCID) DETECTED (A) NOT DETECTED Final    Comment: Methicillin (oxacillin) susceptible Staphylococcus aureus (MSSA). Preferred therapy is anti staphylococcal beta lactam antibiotic (Cefazolin or [name]), unless clinically contraindicated. CRITICAL RESULT CALLED TO, READ BACK BY AND VERIFIED WITH: SCOTT HALL ON 05/10/19 AT 0052 [facility]    Methicillin resistance NOT DETECTED NOT DETECTED Final   Streptococcus species NOT DETECTED NOT DETECTED Final   Streptococcus agalactiae NOT DETECTED NOT DETECTED Final   Streptococcus pneumoniae NOT DETECTED NOT DETECTED Final   Streptococcus pyogenes NOT DETECTED NOT DETECTED Final   Acinetobacter baumannii NOT DETECTED NOT DETECTED Final   Enterobacteriaceae species NOT DETECTED NOT DETECTED Final   Enterobacter cloacae complex NOT DETECTED NOT DETECTED Final   Escherichia coli NOT DETECTED NOT DETECTED Final   Klebsiella oxytoca NOT DETECTED NOT DETECTED Final   Klebsiella pneumoniae NOT DETECTED NOT DETECTED Final   Proteus species NOT DETECTED NOT DETECTED Final   Serratia marcescens NOT DETECTED NOT DETECTED  Final   Haemophilus influenzae NOT DETECTED NOT DETECTED Final   Neisseria meningitidis NOT DETECTED NOT DETECTED Final   Pseudomonas aeruginosa NOT DETECTED NOT DETECTED Final   Candida albicans NOT DETECTED NOT DETECTED Final   Candida glabrata NOT DETECTED NOT DETECTED Final   Candida krusei NOT DETECTED NOT DETECTED Final   Candida parapsilosis NOT DETECTED NOT DETECTED Final   Candida tropicalis NOT DETECTED NOT DETECTED Final    Comment: Performed at [facility], [address] [place], [place] [address]  Culture, blood (routine x 2)     Status: None (Preliminary result)   Collection Time: 05/09/19  1:52 PM   Specimen: BLOOD  Result Value Ref Range Status   Specimen Description BLOOD BLOOD LEFT FOREARM  Final   Special Requests   Final    BOTTLES DRAWN AEROBIC AND ANAEROBIC Blood Culture adequate volume   Culture  Setup Time   Final    IN BOTH AEROBIC AND ANAEROBIC BOTTLES GRAM POSITIVE COCCI CRITICAL VALUE NOTED.  VALUE IS CONSISTENT WITH PREVIOUSLY REPORTED AND CALLED VALUE. Performed at [facility], [address] [place], [place] [address]    Culture [facility] POSITIVE COCCI  Final   Report Status PENDING  Incomplete  Respiratory Panel by RT PCR (Flu A&B, Covid) - Nasopharyngeal Swab     Status: None   Collection Time: 05/09/19  3:11 PM   Specimen: Nasopharyngeal Swab  Result Value Ref Range Status   SARS Coronavirus 2 by RT PCR NEGATIVE NEGATIVE Final    Comment: (NOTE) SARS-CoV-2 target nucleic acids are NOT DETECTED. The SARS-CoV-2 RNA is generally detectable in upper respiratoy specimens during the acute phase of infection. The lowest concentration of SARS-CoV-2 viral copies this assay can detect is 131 copies/mL. A negative result does not preclude SARS-Cov-2 infection and should not be used as the sole basis for treatment or other patient management decisions. A negative result may occur with  improper specimen collection/handling, submission  of specimen other than nasopharyngeal swab, presence of viral mutation(s) within the areas targeted by this assay, and inadequate number of viral copies (<131 copies/mL). A negative result must be combined with clinical observations, patient history, and epidemiological information. The expected result is Negative. Fact Sheet for Patients:  https://www.moore.com/ Fact Sheet for Healthcare Providers:  https://www.young.biz/ This test is not yet ap proved or cleared by the [place] FDA and  has been authorized for detection and/or diagnosis of SARS-CoV-2 by FDA under an Emergency Use Authorization (EUA). This EUA will remain  in effect (meaning this test can be used) for the duration of the COVID-19 declaration under Section 564(b)(1) of the Act, 21 U.S.C. section 360bbb-3(b)(1), unless the authorization is terminated or revoked sooner.    Influenza A by PCR NEGATIVE NEGATIVE Final   Influenza B by PCR NEGATIVE NEGATIVE Final    Comment: (NOTE) The Xpert Xpress SARS-CoV-2/FLU/RSV assay is intended as an aid in  the diagnosis of influenza from Nasopharyngeal swab specimens and  should not be used as a sole basis for treatment. Nasal washings and  aspirates are unacceptable for Xpert Xpress SARS-CoV-2/FLU/RSV  testing. Fact Sheet for Patients: https://www.moore.com/ Fact Sheet for Healthcare Providers: https://www.young.biz/ This test is not yet approved or cleared by the [place] FDA and  has been authorized for detection and/or diagnosis of SARS-CoV-2 by  FDA under an Emergency Use Authorization (EUA). This EUA will remain  in effect (meaning this test can be used) for the duration of the  Covid-19 declaration under Section 564(b)(1) of the Act, 21  U.S.C. section 360bbb-3(b)(1), unless the authorization is  terminated or revoked. Performed at [facility], [address]  [place], [place] [address]     Procedures and diagnostic studies:  DG Chest 1 View  Result Date: 05/09/2019 CLINICAL DATA:  Fever, weakness. EXAM: CHEST  1 VIEW COMPARISON:  None. FINDINGS: The heart size and mediastinal contours are within normal limits. No pneumothorax or pleural effusion is noted. Left-sided pacemaker is noted. Sternotomy wires are noted. Both lungs are clear. The visualized skeletal structures are unremarkable. IMPRESSION: No active disease. Electronically Signed   By: Lupita Raider M.D.   On: 05/09/2019 14:44    Medications:   . heparin  5,000 Units Subcutaneous Q8H   Continuous Infusions: .  ceFAZolin (ANCEF) IV 2 g (05/10/19 1136)  . lactated ringers 75 mL/hr at 05/10/19 0400     LOS: 1 day   Willy Vorce  [facility]     05/10/2019, 2:41 PM

## 2019-05-10 NOTE — Consult Note (Signed)
Patient with positive MSSA bacteremia with an unknown cardiac surgery history.   Now on Ancef appropriately and TTE ordered. Formal consult tomorrow.  Gardiner Barefoot, MD

## 2019-05-10 NOTE — Progress Notes (Signed)
PHARMACY - PHYSICIAN COMMUNICATION CRITICAL VALUE ALERT - BLOOD CULTURE IDENTIFICATION (BCID)  James Ferrell is an 44 y.o. male who presented to [facility] on 05/09/2019 with a chief complaint of sepsis  Assessment:  Lab reports 4 of 4 bottles w/ GPC, staph aureus, MecA not detected  Name of physician (or Provider) Contacted: Reyes Ivan, NP  Current antibiotics: Cefepime and Vancomycin  Changes to prescribed antibiotics recommended: change to Ancef 2gm IV q8hrs Recommendations accepted by provider  Results for orders placed or performed during the hospital encounter of 05/09/19  Blood Culture ID Panel (Reflexed) (Collected: 05/09/2019  1:10 PM)  Result Value Ref Range   Enterococcus species NOT DETECTED NOT DETECTED   Listeria monocytogenes NOT DETECTED NOT DETECTED   Staphylococcus species DETECTED (A) NOT DETECTED   Staphylococcus aureus (BCID) DETECTED (A) NOT DETECTED   Methicillin resistance NOT DETECTED NOT DETECTED   Streptococcus species NOT DETECTED NOT DETECTED   Streptococcus agalactiae NOT DETECTED NOT DETECTED   Streptococcus pneumoniae NOT DETECTED NOT DETECTED   Streptococcus pyogenes NOT DETECTED NOT DETECTED   Acinetobacter baumannii NOT DETECTED NOT DETECTED   Enterobacteriaceae species NOT DETECTED NOT DETECTED   Enterobacter cloacae complex NOT DETECTED NOT DETECTED   Escherichia coli NOT DETECTED NOT DETECTED   Klebsiella oxytoca NOT DETECTED NOT DETECTED   Klebsiella pneumoniae NOT DETECTED NOT DETECTED   Proteus species NOT DETECTED NOT DETECTED   Serratia marcescens NOT DETECTED NOT DETECTED   Haemophilus influenzae NOT DETECTED NOT DETECTED   Neisseria meningitidis NOT DETECTED NOT DETECTED   Pseudomonas aeruginosa NOT DETECTED NOT DETECTED   Candida albicans NOT DETECTED NOT DETECTED   Candida glabrata NOT DETECTED NOT DETECTED   Candida krusei NOT DETECTED NOT DETECTED   Candida parapsilosis NOT DETECTED NOT DETECTED   Candida tropicalis NOT DETECTED  NOT DETECTED    Valrie Hart A 05/10/2019  1:08 AM

## 2019-05-11 ENCOUNTER — Inpatient Hospital Stay (HOSPITAL_COMMUNITY)
Admit: 2019-05-11 | Discharge: 2019-05-11 | Disposition: A | Discharge status: Home / Self Care | Payer: Medicaid Other | Attending: Internal Medicine | Admitting: Internal Medicine

## 2019-05-11 DIAGNOSIS — R7401 Elevation of levels of liver transaminase levels: Secondary | ICD-10-CM

## 2019-05-11 DIAGNOSIS — Z8673 Personal history of transient ischemic attack (TIA), and cerebral infarction without residual deficits: Secondary | ICD-10-CM

## 2019-05-11 DIAGNOSIS — F17200 Nicotine dependence, unspecified, uncomplicated: Secondary | ICD-10-CM

## 2019-05-11 DIAGNOSIS — Z952 Presence of prosthetic heart valve: Secondary | ICD-10-CM

## 2019-05-11 DIAGNOSIS — R7881 Bacteremia: Secondary | ICD-10-CM

## 2019-05-11 DIAGNOSIS — Z950 Presence of cardiac pacemaker: Secondary | ICD-10-CM

## 2019-05-11 DIAGNOSIS — N179 Acute kidney failure, unspecified: Secondary | ICD-10-CM

## 2019-05-11 DIAGNOSIS — R011 Cardiac murmur, unspecified: Secondary | ICD-10-CM

## 2019-05-11 DIAGNOSIS — Z21 Asymptomatic human immunodeficiency virus [HIV] infection status: Secondary | ICD-10-CM

## 2019-05-11 DIAGNOSIS — Z8619 Personal history of other infectious and parasitic diseases: Secondary | ICD-10-CM

## 2019-05-11 LAB — HIV-1/2 AB - DIFFERENTIATION
HIV 1 Ab: POSITIVE — AB
HIV 2 Ab: NEGATIVE

## 2019-05-11 LAB — COMPREHENSIVE METABOLIC PANEL
ALT: 61 U/L — ABNORMAL HIGH (ref 0–44)
AST: 61 U/L — ABNORMAL HIGH (ref 15–41)
Albumin: 3 g/dL — ABNORMAL LOW (ref 3.5–5.0)
Alkaline Phosphatase: 67 U/L (ref 38–126)
Anion gap: 9 (ref 5–15)
BUN: 11 mg/dL (ref 6–20)
CO2: 21 mmol/L — ABNORMAL LOW (ref 22–32)
Calcium: 8.7 mg/dL — ABNORMAL LOW (ref 8.9–10.3)
Chloride: 105 mmol/L (ref 98–111)
Creatinine, Ser: 1.22 mg/dL (ref 0.61–1.24)
GFR calc Af Amer: >60 mL/min (ref >60)
GFR calc non Af Amer: >60 mL/min (ref >60)
Glucose, Bld: 103 mg/dL — ABNORMAL HIGH (ref 70–99)
Potassium: 3.8 mmol/L (ref 3.5–5.1)
Sodium: 135 mmol/L (ref 135–145)
Total Bilirubin: 1.6 mg/dL — ABNORMAL HIGH (ref 0.3–1.2)
Total Protein: 7.2 g/dL (ref 6.5–8.1)

## 2019-05-11 LAB — PROCALCITONIN: Procalcitonin: 13.97 ng/mL

## 2019-05-11 LAB — ECHOCARDIOGRAM COMPLETE
Height: 71 in
Weight: 2960 oz

## 2019-05-11 NOTE — Progress Notes (Signed)
*  PRELIMINARY RESULTS* Echocardiogram 2D Echocardiogram has been performed.  Cristela Blue 05/11/2019, 2:28 PM

## 2019-05-11 NOTE — Consult Note (Signed)
NAME: Sacha Radloff  DOB: [birth year]  MRN: [id number]  Date/Time: 05/11/2019 9:38 AM  REQUESTING PROVIDER: Dr. Myriam Forehand Subjective:  REASON FOR CONSULT: Staph aureus bacteremia.  ?pt is a poor historian Atzel Mccambridge is a 44 y.o. male with a history of CVA, HIV, Pacemaker,  presented to the ED with fatigue and weakness for a few days. Pt moved to [place] from [facility] a year ago and has not established care here in [place] Patient presented on 4/17/2021with weakness, fatigue and in the ED his heart rate was 120s, blood pressure was 87/46. WBC was 15.8 and lactate was normal.  Chest x-ray did not show any acute abnormality.  Blood cultures were sent.  Started on IV fluids and given IV cefepime and vancomycin. Blood culture came back as staph aureus.  His antibiotics was changed to cefazolin and I am seeing the patient for the same.. Patient has a history of cardiac valve surgery and had developed a stroke while having the surgery. He used to live in [place] and just moved to [place] recently. On further questioning patient says he has HIV and is followed by Dr. Lorin Mercy in [place].  He says he has been on a Atripla.  He does not know his last viral load or CD4 count. He last saw Dr. Starr Lake PA in May 2020. He is not aware why he had a heart surgery.  He remembers living with his sister and had a syncopal episode and was taken to the hospital and had to undergo heart surgery and assess heart rate was low he also had a pacemaker placed. Patient does snort cocaine but denies IV drug use. Past Medical History:  Diagnosis Date  . Stroke [facility] [address])     Past Surgical History:  Procedure Laterality Date  . CARDIAC VALVE SURGERY    Pacemaker  Social history Current daily smoker Drinks beer 3 cans/day No IV drug use Occasionally snorts cocaine  Family history Diabetes mellitus mom's sister Schizophrenia mother Coronary artery disease-dad's brother    ? Current Facility-Administered  Medications  Medication Dose Route Frequency Provider Last Rate Last Admin  . acetaminophen (TYLENOL) tablet 650 mg  650 mg Oral Q6H PRN Lurene Shadow, MD   650 mg at 05/10/19 2230   Or  . acetaminophen (TYLENOL) suppository 650 mg  650 mg Rectal Q6H PRN Lurene Shadow, MD      . ceFAZolin (ANCEF) IVPB 2g/100 mL premix  2 g Intravenous Q8H Hall, Scott A, RPH 200 mL/hr at 05/11/19 0417 2 g at 05/11/19 0417  . heparin injection 5,000 Units  5,000 Units Subcutaneous Q8H Lurene Shadow, MD   5,000 Units at 05/11/19 0506  . ondansetron (ZOFRAN) tablet 4 mg  4 mg Oral Q6H PRN Lurene Shadow, MD       Or  . ondansetron [facility]) injection 4 mg  4 mg Intravenous Q6H PRN Lurene Shadow, MD         Abtx:  Anti-infectives (From admission, onward)   Start     Dose/Rate Route Frequency Ordered Stop   05/10/19 2200  ceFEPIme (MAXIPIME) 2 g in sodium chloride 0.9 % 100 mL IVPB  Status:  Discontinued     2 g 200 mL/hr over 30 Minutes Intravenous Every 8 hours 05/09/19 1840 05/10/19 0121   05/10/19 2000  vancomycin (VANCOREADY) IVPB 1750 mg/350 mL  Status:  Discontinued     1,750 mg 175 mL/hr over 120 Minutes Intravenous Every 24 hours 05/09/19 1847 05/10/19 0121   05/10/19 0400  ceFAZolin (ANCEF) IVPB 2g/100 mL premix     2 g 200 mL/hr over 30 Minutes Intravenous Every 8 hours 05/10/19 0123     05/09/19 1845  vancomycin (VANCOREADY) IVPB 2000 mg/400 mL     2,000 mg 200 mL/hr over 120 Minutes Intravenous  Once 05/09/19 1840 05/10/19 0700   05/09/19 1345  ceFEPIme (MAXIPIME) 2 g in sodium chloride 0.9 % 100 mL IVPB     2 g 200 mL/hr over 30 Minutes Intravenous  Once 05/09/19 1340 05/09/19 1510      REVIEW OF SYSTEMS:  Const: Patient did not check his temperature.  He complains of fatigue and generalized weakness  eyes: negative diplopia or visual changes, negative eye pain ENT: negative coryza, negative sore throat Resp: negative cough, hemoptysis, dyspnea Cards: negative for chest pain,  palpitations, lower extremity edema GU: negative for frequency, dysuria and hematuria GI: Negative for abdominal pain, diarrhea, bleeding, constipation Skin: negative for rash and pruritus Heme: negative for easy bruising and gum/nose bleeding MS: Generalized weakness  Neurolo:negative for headaches, dizziness, vertigo, memory problems  Psych: negative for feelings of anxiety, depression  Endocrine: negative for thyroid, diabetes Allergy/Immunology- negative for any medication or food allergies  Objective:  VITALS:  BP 109/66 (BP Location: Left Arm)   Pulse 98   Temp 99 F (37.2 C)   Resp 16   Ht 5\' 11"  (1.803 m)   Wt 83.9 kg   SpO2 97%   BMI 25.80 kg/m  PHYSICAL EXAM:  General: Alert, cooperative, no distress, appears stated age.  Head: Normocephalic, without obvious abnormality, atraumatic. Eyes: Conjunctivae clear, anicteric sclerae. Pupils are equal ENT Nares normal. No drainage or sinus tenderness. Lips, mucosa, and tongue normal. No Thrush Neck: Supple, symmetrical, no adenopathy, thyroid: non tender no carotid bruit and no JVD. Back: No CVA tenderness. Lungs: Clear to auscultation bilaterally. No Wheezing or Rhonchi. No rales. Heart: S1-S2. Systolic murmur Abdomen: Soft, non-tender, distended. Bowel sounds normal. No masses Extremities: atraumatic, no cyanosis. No edema. No clubbing Skin: No rashes or lesions. Or bruising Lymph: Cervical, supraclavicular normal. Neurologic: Grossly non-focal Pertinent Labs Lab Results CBC    Component Value Date/Time   WBC 9.9 05/10/2019 0533   RBC 4.20 (L) 05/10/2019 0533   HGB 12.4 (L) 05/10/2019 0533   HCT 37.9 (L) 05/10/2019 0533   PLT 156 05/10/2019 0533   MCV 90.2 05/10/2019 0533   MCH 29.5 05/10/2019 0533   MCHC 32.7 05/10/2019 0533   RDW 12.3 05/10/2019 0533    CMP Latest Ref Rng & Units 05/11/2019 05/10/2019 05/09/2019  Glucose 70 - 99 mg/dL 161(W103(H) 960(A116(H) 540(J114(H)  BUN 6 - 20 mg/dL 11 13 16   Creatinine 0.61 - 1.24  mg/dL 8.111.22 9.14(N1.38(H) 8.29(F1.69(H)  Sodium 135 - 145 mmol/L 135 135 135  Potassium 3.5 - 5.1 mmol/L 3.8 3.6 4.1  Chloride 98 - 111 mmol/L 105 109 110  CO2 22 - 32 mmol/L 21(L) 21(L) 20(L)  Calcium 8.9 - 10.3 mg/dL 6.2(Z8.7(L) 8.3(L) 7.4(L)  Total Protein 6.5 - 8.1 g/dL 7.2 7.1 3.0(Q6.4(L)  Total Bilirubin 0.3 - 1.2 mg/dL 6.5(H1.6(H) 8.4(O1.6(H) 1.0  Alkaline Phos 38 - 126 U/L 67 65 60  AST 15 - 41 U/L 61(H) 58(H) 43(H)  ALT 0 - 44 U/L 61(H) 60(H) 47(H)      Microbiology: Recent Results (from the past 240 hour(s))  Culture, blood (routine x 2)     Status: Abnormal (Preliminary result)   Collection Time: 05/09/19  1:10 PM   Specimen: BLOOD  Result Value Ref Range Status   Specimen Description   Final    BLOOD RFA Performed at [facility], [address] [place], [place] [address]    Special Requests   Final    BOTTLES DRAWN AEROBIC AND ANAEROBIC Blood Culture results may not be optimal due to an excessive volume of blood received in culture bottles Performed at [facility], [address] [place], [place] [address]    Culture  Setup Time   Final    IN BOTH AEROBIC AND ANAEROBIC BOTTLES GRAM POSITIVE COCCI CRITICAL RESULT CALLED TO, READ BACK BY AND VERIFIED WITH: SCOTT HALL ON 05/10/19 AT 0052 [facility] Performed at [facility], [address], [place], [place] [address]    Culture STAPHYLOCOCCUS AUREUS (A)  Final   Report Status PENDING  Incomplete  Blood Culture ID Panel (Reflexed)     Status: Abnormal   Collection Time: 05/09/19  1:10 PM  Result Value Ref Range Status   Enterococcus species NOT DETECTED NOT DETECTED Final   Listeria monocytogenes NOT DETECTED NOT DETECTED Final   Staphylococcus species DETECTED (A) NOT DETECTED Final    Comment: CRITICAL RESULT CALLED TO, READ BACK BY AND VERIFIED WITH: SCOTT HALL ON 05/10/19 AT 0052 [facility]    Staphylococcus aureus (BCID) DETECTED (A) NOT DETECTED Final    Comment: Methicillin (oxacillin) susceptible Staphylococcus aureus  (MSSA). Preferred therapy is anti staphylococcal beta lactam antibiotic (Cefazolin or [name]), unless clinically contraindicated. CRITICAL RESULT CALLED TO, READ BACK BY AND VERIFIED WITH: SCOTT HALL ON 05/10/19 AT 0052 [facility]    Methicillin resistance NOT DETECTED NOT DETECTED Final   Streptococcus species NOT DETECTED NOT DETECTED Final   Streptococcus agalactiae NOT DETECTED NOT DETECTED Final   Streptococcus pneumoniae NOT DETECTED NOT DETECTED Final   Streptococcus pyogenes NOT DETECTED NOT DETECTED Final   Acinetobacter baumannii NOT DETECTED NOT DETECTED Final   Enterobacteriaceae species NOT DETECTED NOT DETECTED Final   Enterobacter cloacae complex NOT DETECTED NOT DETECTED Final   Escherichia coli NOT DETECTED NOT DETECTED Final   Klebsiella oxytoca NOT DETECTED NOT DETECTED Final   Klebsiella pneumoniae NOT DETECTED NOT DETECTED Final   Proteus species NOT DETECTED NOT DETECTED Final   Serratia marcescens NOT DETECTED NOT DETECTED Final   Haemophilus influenzae NOT DETECTED NOT DETECTED Final   Neisseria meningitidis NOT DETECTED NOT DETECTED Final   Pseudomonas aeruginosa NOT DETECTED NOT DETECTED Final   Candida albicans NOT DETECTED NOT DETECTED Final   Candida glabrata NOT DETECTED NOT DETECTED Final   Candida krusei NOT DETECTED NOT DETECTED Final   Candida parapsilosis NOT DETECTED NOT DETECTED Final   Candida tropicalis NOT DETECTED NOT DETECTED Final    Comment: Performed at [facility], [address] [place], [place] [address]  Culture, blood (routine x 2)     Status: Abnormal (Preliminary result)   Collection Time: 05/09/19  1:52 PM   Specimen: BLOOD  Result Value Ref Range Status   Specimen Description   Final    BLOOD BLOOD LEFT FOREARM Performed at [facility], [address], [place], [place] [address]    Special Requests   Final    BOTTLES DRAWN AEROBIC AND ANAEROBIC Blood Culture adequate volume Performed at [facility],  [address] [place], [place] [address]    Culture  Setup Time   Final    IN BOTH AEROBIC AND ANAEROBIC BOTTLES GRAM POSITIVE COCCI CRITICAL VALUE NOTED.  VALUE IS CONSISTENT WITH PREVIOUSLY REPORTED  AND CALLED VALUE. Performed at [facility], [address] [place], [place] [address]    Culture STAPHYLOCOCCUS AUREUS (A)  Final   Report Status PENDING  Incomplete  Respiratory Panel by RT PCR (Flu A&B, Covid) - Nasopharyngeal Swab     Status: None   Collection Time: 05/09/19  3:11 PM   Specimen: Nasopharyngeal Swab  Result Value Ref Range Status   SARS Coronavirus 2 by RT PCR NEGATIVE NEGATIVE Final    Comment: (NOTE) SARS-CoV-2 target nucleic acids are NOT DETECTED. The SARS-CoV-2 RNA is generally detectable in upper respiratoy specimens during the acute phase of infection. The lowest concentration of SARS-CoV-2 viral copies this assay can detect is 131 copies/mL. A negative result does not preclude SARS-Cov-2 infection and should not be used as the sole basis for treatment or other patient management decisions. A negative result may occur with  improper specimen collection/handling, submission of specimen other than nasopharyngeal swab, presence of viral mutation(s) within the areas targeted by this assay, and inadequate number of viral copies (<131 copies/mL). A negative result must be combined with clinical observations, patient history, and epidemiological information. The expected result is Negative. Fact Sheet for Patients:  PinkCheek.be Fact Sheet for Healthcare Providers:  GravelBags.it This test is not yet ap proved or cleared by the [place] FDA and  has been authorized for detection and/or diagnosis of SARS-CoV-2 by FDA under an Emergency Use Authorization (EUA). This EUA will remain  in effect (meaning this test can be used) for the duration of the COVID-19 declaration under Section  564(b)(1) of the Act, 21 U.S.C. section 360bbb-3(b)(1), unless the authorization is terminated or revoked sooner.    Influenza A by PCR NEGATIVE NEGATIVE Final   Influenza B by PCR NEGATIVE NEGATIVE Final    Comment: (NOTE) The Xpert Xpress SARS-CoV-2/FLU/RSV assay is intended as an aid in  the diagnosis of influenza from Nasopharyngeal swab specimens and  should not be used as a sole basis for treatment. Nasal washings and  aspirates are unacceptable for Xpert Xpress SARS-CoV-2/FLU/RSV  testing. Fact Sheet for Patients: PinkCheek.be Fact Sheet for Healthcare Providers: GravelBags.it This test is not yet approved or cleared by the [place] FDA and  has been authorized for detection and/or diagnosis of SARS-CoV-2 by  FDA under an Emergency Use Authorization (EUA). This EUA will remain  in effect (meaning this test can be used) for the duration of the  Covid-19 declaration under Section 564(b)(1) of the Act, 21  U.S.C. section 360bbb-3(b)(1), unless the authorization is  terminated or revoked. Performed at [facility], [address] [place], [place] [address]   EKG   IMAGING RESULTS:  I have personally reviewed the films ?The heart size and mediastinal contours are within normal limits. No pneumothorax or pleural effusion is noted. Left-sided pacemaker is noted. Sternotomy wires are noted. Both lungs are clear. The visualized skeletal structures are unremarkable Impression/Recommendation ? ?Staph aureus bacteremia Need to rule out endocarditis as he has had mitral valve replacement and also has a pacemaker. 2D echo TEE Repeat blood cultures Continue IV cefazolin.  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  We need to get the entire records from [facility] ?  AKI on presentation: Unclear previous  kidney status.  Could be related to infection.  Improving.  Transaminitis  Known HIV infection Patient says he is on a Atripla but his PA said that he was on Symtuza.  Last CD4 304 and viral load less than 20 which is from May 2020. 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 ___________________________________________________ Discussed with patient Need to get records from [facility] Note:  This document was prepared using Conservation officer, historic buildings and may include unintentional dictation errors.

## 2019-05-11 NOTE — Progress Notes (Signed)
Progress Note    James Ferrell  CVE:[id number] DOB: Mar 17, 1975  DOA: 05/09/2019 PCP: Patient, No Pcp Per      Brief Narrative:    Medical records reviewed and are as summarized below:  James Ferrell is an 44 y.o. male with medical history significant for stroke (occured during during heart valve surgery) and heart valve surgery (does not know which valve) who presented to the hospital with fever and chills.  His symptoms started few days ago prior to admission but had progressively worsened.  He developed generalized weakness and dizziness and he felt as though he was going to fall down.  He said he was also exposed to some cleaning chemicals on the day prior to admission when he tried to do some cleaning in the bathroom.  He went to [facility] on the day of admission where his temperature was 103 F in the emergency department.  However, because of long waiting times in the emergency department at [facility], he came to [facility] for further evaluation. He was tachycardic and hypotensive in the emergency room and was given boluses of IV fluids.  He was initially diagnosed with sepsis of unknown etiology.  Blood culture grew MSSA.      Assessment/Plan:   Principal Problem:   Sepsis (HCC) Active Problems:   Bacteremia due to Staphylococcus aureus   Hypotension   AKI (acute kidney injury) (HCC)   Leukocytosis   MSSA sepsis and bacteremia/leukocytosis: Continue IV Ancef.  2D did not show any evidence of endocarditis.  Consult cardiologist for TEE. Tylenol as needed for fever.  Consulted ID  Acute kidney injury: Resolved.  Discontinue IV fluids.  Hypotension: Resolved  Hyponatremia: Resolved.  Mildly elevated liver enzymes: Hepatitis panel is pending.  History of stroke: Patient was previously on aspirin and Lipitor but stopped taking them months ago.  HIV infection: Quantitative HIV RNA is pending.  Permanent pacemaker status,  history of heart valve surgery. Will try to obtain records from [facility] in [place].   Previous home medications: His significant other brought some medications that were filled in June 2020.  She said patient used to take them but he stopped taking them months ago.  These were low-dose aspirin, metoprolol, Lipitor, naproxen and cyclobenzaprine.    Body mass index is 25.8 kg/m.   Family Communication/Anticipated D/C date and plan/Code Status   DVT prophylaxis: Lovenox Code Status: Full code Family Communication: Plan discussed with patient Disposition Plan:    Status is: Inpatient  Remains inpatient appropriate because:IV treatments appropriate due to intensity of illness or inability to take PO and Inpatient level of care appropriate due to severity of illness   Dispo: The patient is from: Home              Anticipated d/c is to: Home              Anticipated d/c date is: > 3 days              Patient currently is not medically stable to d/c.            Subjective:   No complaints. No chest pain, fever, chills  Objective:    Vitals:   05/11/19 0430 05/11/19 1226 05/11/19 1629 05/11/19 2004  BP: 109/66 108/70 (!) 98/59 118/77  Pulse: 98 (!) 105 (!) 102 97  Resp: 16 20 18 18   Temp: 99 F (37.2 C) 97.9 F (36.6 C) 99.1 F (37.3 C)  98.9 F (37.2 C)  TempSrc:  Oral Oral Oral  SpO2: 97% 99% 97% 98%  Weight:      Height:        Intake/Output Summary (Last 24 hours) at 05/11/2019 2139 Last data filed at 05/11/2019 1638 Gross per 24 hour  Intake 1045.24 ml  Output 3150 ml  Net -2104.76 ml   Filed Weights   05/09/19 1305  Weight: 83.9 kg    Exam:  GEN: NAD SKIN: No rash EYES: EOMI ENT: MMM CV: RRR PULM: No wheezing or rales heard ABD: soft, ND, NT, +BS CNS: AAO x 3, non focal EXT: No edema or tenderness   Data Reviewed:   I have personally reviewed following labs and imaging studies:  Labs: Labs show the following:   Basic  Metabolic Panel: Recent Labs  Lab 05/09/19 1309 05/09/19 1309 05/09/19 1600 05/09/19 1600 05/10/19 0533 05/11/19 0409  NA 131*  --  135  --  135 135  K 4.3   < > 4.1   < > 3.6 3.8  CL 103  --  110  --  109 105  CO2 18*  --  20*  --  21* 21*  GLUCOSE 121*  --  114*  --  116* 103*  BUN 18  --  16  --  13 11  CREATININE 1.86*  --  1.69*  --  1.38* 1.22  CALCIUM 9.0  --  7.4*  --  8.3* 8.7*   < > = values in this interval not displayed.   GFR Estimated Creatinine Clearance: 84.9 mL/min (by C-G formula based on SCr of 1.22 mg/dL). Liver Function Tests: Recent Labs  Lab 05/09/19 1600 05/10/19 0533 05/11/19 0409  AST 43* 58* 61*  ALT 47* 60* 61*  ALKPHOS 60 65 67  BILITOT 1.0 1.6* 1.6*  PROT 6.4* 7.1 7.2  ALBUMIN 3.0* 3.2* 3.0*   No results for input(s): LIPASE, AMYLASE in the last 168 hours. No results for input(s): AMMONIA in the last 168 hours. Coagulation profile No results for input(s): INR, PROTIME in the last 168 hours.  CBC: Recent Labs  Lab 05/09/19 1309 05/10/19 0533  WBC 15.8* 9.9  HGB 13.9 12.4*  HCT 42.1 37.9*  MCV 88.4 90.2  PLT 200 156   Cardiac Enzymes: No results for input(s): CKTOTAL, CKMB, CKMBINDEX, TROPONINI in the last 168 hours. BNP (last 3 results) No results for input(s): PROBNP in the last 8760 hours. CBG: No results for input(s): GLUCAP in the last 168 hours. D-Dimer: No results for input(s): DDIMER in the last 72 hours. Hgb A1c: No results for input(s): HGBA1C in the last 72 hours. Lipid Profile: No results for input(s): CHOL, HDL, LDLCALC, TRIG, CHOLHDL, LDLDIRECT in the last 72 hours. Thyroid function studies: No results for input(s): TSH, T4TOTAL, T3FREE, THYROIDAB in the last 72 hours.  Invalid input(s): FREET3 Anemia work up: No results for input(s): VITAMINB12, FOLATE, FERRITIN, TIBC, IRON, RETICCTPCT in the last 72 hours. Sepsis Labs: Recent Labs  Lab 05/09/19 1309 05/09/19 1332 05/09/19 1600 05/10/19 0533  05/11/19 0409  PROCALCITON  --   --  17.78 21.77 13.97  WBC 15.8*  --   --  9.9  --   LATICACIDVEN  --  1.3  --   --   --     Microbiology Recent Results (from the past 240 hour(s))  Culture, blood (routine x 2)     Status: Abnormal (Preliminary result)   Collection Time: 05/09/19  1:10 PM  Specimen: BLOOD  Result Value Ref Range Status   Specimen Description   Final    BLOOD RFA Performed at [facility], [address] [place], [place] [address]    Special Requests   Final    BOTTLES DRAWN AEROBIC AND ANAEROBIC Blood Culture results may not be optimal due to an excessive volume of blood received in culture bottles Performed at [facility], [address] [place], [place] [address]    Culture  Setup Time   Final    IN BOTH AEROBIC AND ANAEROBIC BOTTLES GRAM POSITIVE COCCI CRITICAL RESULT CALLED TO, READ BACK BY AND VERIFIED WITH: SCOTT HALL ON 05/10/19 AT 0052 [facility] Performed at [facility], [address] [place], [place] [address]    Culture STAPHYLOCOCCUS AUREUS (A)  Final   Report Status PENDING  Incomplete  Blood Culture ID Panel (Reflexed)     Status: Abnormal   Collection Time: 05/09/19  1:10 PM  Result Value Ref Range Status   Enterococcus species NOT DETECTED NOT DETECTED Final   Listeria monocytogenes NOT DETECTED NOT DETECTED Final   Staphylococcus species DETECTED (A) NOT DETECTED Final    Comment: CRITICAL RESULT CALLED TO, READ BACK BY AND VERIFIED WITH: SCOTT HALL ON 05/10/19 AT 0052 [facility]    Staphylococcus aureus (BCID) DETECTED (A) NOT DETECTED Final    Comment: Methicillin (oxacillin) susceptible Staphylococcus aureus (MSSA). Preferred therapy is anti staphylococcal beta lactam antibiotic (Cefazolin or [name]), unless clinically contraindicated. CRITICAL RESULT CALLED TO, READ BACK BY AND VERIFIED WITH: SCOTT HALL ON 05/10/19 AT 0052 [facility]    Methicillin resistance NOT DETECTED NOT DETECTED Final   Streptococcus species NOT  DETECTED NOT DETECTED Final   Streptococcus agalactiae NOT DETECTED NOT DETECTED Final   Streptococcus pneumoniae NOT DETECTED NOT DETECTED Final   Streptococcus pyogenes NOT DETECTED NOT DETECTED Final   Acinetobacter baumannii NOT DETECTED NOT DETECTED Final   Enterobacteriaceae species NOT DETECTED NOT DETECTED Final   Enterobacter cloacae complex NOT DETECTED NOT DETECTED Final   Escherichia coli NOT DETECTED NOT DETECTED Final   Klebsiella oxytoca NOT DETECTED NOT DETECTED Final   Klebsiella pneumoniae NOT DETECTED NOT DETECTED Final   Proteus species NOT DETECTED NOT DETECTED Final   Serratia marcescens NOT DETECTED NOT DETECTED Final   Haemophilus influenzae NOT DETECTED NOT DETECTED Final   Neisseria meningitidis NOT DETECTED NOT DETECTED Final   Pseudomonas aeruginosa NOT DETECTED NOT DETECTED Final   Candida albicans NOT DETECTED NOT DETECTED Final   Candida glabrata NOT DETECTED NOT DETECTED Final   Candida krusei NOT DETECTED NOT DETECTED Final   Candida parapsilosis NOT DETECTED NOT DETECTED Final   Candida tropicalis NOT DETECTED NOT DETECTED Final    Comment: Performed at [facility], [address] [place], [place] [address]  Culture, blood (routine x 2)     Status: Abnormal (Preliminary result)   Collection Time: 05/09/19  1:52 PM   Specimen: BLOOD  Result Value Ref Range Status   Specimen Description   Final    BLOOD BLOOD LEFT FOREARM Performed at [facility], [address] [place], [place] [address]    Special Requests   Final    BOTTLES DRAWN AEROBIC AND ANAEROBIC Blood Culture adequate volume Performed at [facility], [address] [place], [place] [address]    Culture  Setup Time   Final    IN BOTH AEROBIC AND ANAEROBIC BOTTLES GRAM POSITIVE COCCI CRITICAL VALUE NOTED.  VALUE IS CONSISTENT WITH  PREVIOUSLY REPORTED AND CALLED VALUE. Performed at [facility], [address], [place], [place] [address]     Culture STAPHYLOCOCCUS AUREUS (A)  Final   Report Status PENDING  Incomplete  Respiratory Panel by RT PCR (Flu A&B, Covid) - Nasopharyngeal Swab     Status: None   Collection Time: 05/09/19  3:11 PM   Specimen: Nasopharyngeal Swab  Result Value Ref Range Status   SARS Coronavirus 2 by RT PCR NEGATIVE NEGATIVE Final    Comment: (NOTE) SARS-CoV-2 target nucleic acids are NOT DETECTED. The SARS-CoV-2 RNA is generally detectable in upper respiratoy specimens during the acute phase of infection. The lowest concentration of SARS-CoV-2 viral copies this assay can detect is 131 copies/mL. A negative result does not preclude SARS-Cov-2 infection and should not be used as the sole basis for treatment or other patient management decisions. A negative result may occur with  improper specimen collection/handling, submission of specimen other than nasopharyngeal swab, presence of viral mutation(s) within the areas targeted by this assay, and inadequate number of viral copies (<131 copies/mL). A negative result must be combined with clinical observations, patient history, and epidemiological information. The expected result is Negative. Fact Sheet for Patients:  https://www.moore.com/ Fact Sheet for Healthcare Providers:  https://www.young.biz/ This test is not yet ap proved or cleared by the [place] FDA and  has been authorized for detection and/or diagnosis of SARS-CoV-2 by FDA under an Emergency Use Authorization (EUA). This EUA will remain  in effect (meaning this test can be used) for the duration of the COVID-19 declaration under Section 564(b)(1) of the Act, 21 U.S.C. section 360bbb-3(b)(1), unless the authorization is terminated or revoked sooner.    Influenza A by PCR NEGATIVE NEGATIVE Final   Influenza B by PCR NEGATIVE NEGATIVE Final    Comment: (NOTE) The Xpert Xpress SARS-CoV-2/FLU/RSV assay is intended as an aid in  the diagnosis of  influenza from Nasopharyngeal swab specimens and  should not be used as a sole basis for treatment. Nasal washings and  aspirates are unacceptable for Xpert Xpress SARS-CoV-2/FLU/RSV  testing. Fact Sheet for Patients: https://www.moore.com/ Fact Sheet for Healthcare Providers: https://www.young.biz/ This test is not yet approved or cleared by the [place] FDA and  has been authorized for detection and/or diagnosis of SARS-CoV-2 by  FDA under an Emergency Use Authorization (EUA). This EUA will remain  in effect (meaning this test can be used) for the duration of the  Covid-19 declaration under Section 564(b)(1) of the Act, 21  U.S.C. section 360bbb-3(b)(1), unless the authorization is  terminated or revoked. Performed at [facility], [address] [place], [place] [address]     Procedures and diagnostic studies:  ECHOCARDIOGRAM COMPLETE  Result Date: 05/11/2019    ECHOCARDIOGRAM REPORT   Patient Name:   TREASURE INGRUM Date of Exam: 05/11/2019 Medical Rec #:  [id number]        Height:       71.0 in Accession #:    [id number]       Weight:       185.0 lb Date of Birth:  March 02, 1975        BSA:          2.040 m Patient Age:    43 years         BP:           108/70 mmHg Patient Gender: M                HR:  105 bpm. Exam Location:  [facility] Procedure: 2D Echo, Color Doppler and Cardiac Doppler Indications:     Bacteremia 790.7  History:         Patient has no prior history of Echocardiogram examinations.                  Stroke.  Sonographer:     Sherrie Sport RDCS (AE) Referring Phys:  [place] Diagnosing Phys: Kathlyn Sacramento MD IMPRESSIONS  1. Left ventricular ejection fraction, by estimation, is 50 to 55%. The left ventricle has low normal function. The left ventricle has no regional wall motion abnormalities. The left ventricular internal cavity size was mildly dilated. Left ventricular diastolic parameters are indeterminate.   2. Right ventricular systolic function is normal. The right ventricular size is normal. There is normal pulmonary artery systolic pressure.  3. Right atrial size was mildly dilated.  4. The mitral valve is normal in structure. Trivial mitral valve regurgitation. No evidence of mitral stenosis.  5. The aortic valve is abnormal. Aortic valve regurgitation is trivial. No aortic stenosis is present. Aortic valve is not well visualized but suspect there is a bioprosthesis.  6. 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  7. Rcommend a TEE if there is a concern for endocarditis. FINDINGS  Left Ventricle: Left ventricular ejection fraction, by estimation, is 50 to 55%. The left ventricle has low normal function. The left ventricle has no regional wall motion abnormalities. The left ventricular internal cavity size was mildly dilated. There is no left ventricular hypertrophy. Left ventricular diastolic parameters are indeterminate. Right Ventricle: The right ventricular size is normal. No increase in right ventricular wall thickness. Right ventricular systolic function is normal. There is normal pulmonary artery systolic pressure. The tricuspid regurgitant velocity is 1.81 m/s, and  with an assumed right atrial pressure of 10 mmHg, the estimated right ventricular systolic pressure is 31.4 mmHg. Left Atrium: Left atrial size was normal in size. Right Atrium: Right atrial size was mildly dilated. Pericardium: There is no evidence of pericardial effusion. Mitral Valve: The mitral valve is normal in structure. Normal mobility of the mitral valve leaflets. Trivial mitral valve regurgitation. No evidence of mitral valve stenosis. Tricuspid Valve: The tricuspid valve is normal in structure. Tricuspid valve regurgitation is trivial. No evidence of tricuspid stenosis. Aortic Valve: The aortic valve is abnormal. . There is moderate thickening of the aortic valve. Aortic valve regurgitation  is trivial. No aortic stenosis is present. There is moderate thickening of the aortic valve. Aortic valve mean gradient measures 5.7 mmHg. Aortic valve peak gradient measures 9.5 mmHg. Aortic valve area, by VTI measures 1.93 cm. Pulmonic Valve: The pulmonic valve was normal in structure. Pulmonic valve regurgitation is not visualized. No evidence of pulmonic stenosis. Aorta: The aortic root is normal in size and structure. 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 Venous: The inferior vena cava was not well visualized. IAS/Shunts: No atrial level shunt detected by color flow Doppler.  LEFT VENTRICLE PLAX 2D LVIDd:         5.73 cm  Diastology LVIDs:         3.71 cm  LV e' lateral:   16.30 cm/s LV PW:         1.40 cm  LV E/e' lateral: 6.1 LV IVS:        0.85 cm  LV e' medial:    9.68 cm/s LVOT  diam:     2.00 cm  LV E/e' medial:  10.3 LV SV:         52 LV SV Index:   25 LVOT Area:     3.14 cm  RIGHT VENTRICLE RV Basal diam:  4.24 cm RV S prime:     7.18 cm/s TAPSE (M-mode): 3.6 cm LEFT ATRIUM             Index       RIGHT ATRIUM           Index LA diam:        3.90 cm 1.91 cm/m  RA Area:     22.40 cm LA Vol (A2C):   78.7 ml 38.58 ml/m RA Volume:   74.60 ml  36.57 ml/m LA Vol (A4C):   37.8 ml 18.53 ml/m LA Biplane Vol: 57.0 ml 27.94 ml/m  AORTIC VALVE                    PULMONIC VALVE AV Area (Vmax):    1.66 cm     PV Vmax:        0.79 m/s AV Area (Vmean):   1.65 cm     PV Peak grad:   2.5 mmHg AV Area (VTI):     1.93 cm     RVOT Peak grad: 3 mmHg AV Vmax:           154.00 cm/s AV Vmean:          106.667 cm/s AV VTI:            0.267 m AV Peak Grad:      9.5 mmHg AV Mean Grad:      5.7 mmHg LVOT Vmax:         81.30 cm/s LVOT Vmean:        56.000 cm/s LVOT VTI:          0.164 m LVOT/AV VTI ratio: 0.61  AORTA Ao Root diam: 4.75 cm MITRAL VALVE               TRICUSPID VALVE MV Area (PHT): 4.10 cm    TR Peak grad:   13.1 mmHg MV Decel Time: 185 msec    TR Vmax:         181.00 cm/s MV E velocity: 99.90 cm/s MV A velocity: 65.00 cm/s  SHUNTS MV E/A ratio:  1.54        Systemic VTI:  0.16 m                            Systemic Diam: 2.00 cm Lorine Bears MD Electronically signed by Lorine Bears MD Signature Date/Time: 05/11/2019/2:49:53 PM    Final     Medications:   . heparin  5,000 Units Subcutaneous Q8H   Continuous Infusions: .  ceFAZolin (ANCEF) IV 2 g (05/11/19 1256)     LOS: 2 days   Americo Vallery  [facility]     05/11/2019, 9:39 PM

## 2019-05-12 ENCOUNTER — Ambulatory Visit: Payer: Self-pay | Admitting: Cardiovascular Disease

## 2019-05-12 LAB — CULTURE, BLOOD (ROUTINE X 2): Special Requests: ADEQUATE

## 2019-05-12 LAB — HEPATITIS PANEL, ACUTE
HCV Ab: NONREACTIVE
Hep A IgM: NONREACTIVE
Hep B C IgM: NONREACTIVE
Hepatitis B Surface Ag: NONREACTIVE

## 2019-05-12 LAB — LIPID PANEL
Cholesterol: 166 mg/dL (ref 0–200)
HDL: 13 mg/dL — ABNORMAL LOW (ref >40)
LDL Cholesterol: UNDETERMINED mg/dL (ref 0–99)
Total CHOL/HDL Ratio: 12.8 RATIO
Triglycerides: 767 mg/dL — ABNORMAL HIGH (ref <150)
VLDL: UNDETERMINED mg/dL (ref 0–40)

## 2019-05-12 LAB — HIV-1 RNA QUANT-NO REFLEX-BLD
HIV 1 RNA Quant: 10500 copies/mL
LOG10 HIV-1 RNA: 4.021 log10copy/mL

## 2019-05-12 LAB — LDL CHOLESTEROL, DIRECT: Direct LDL: 35.7 mg/dL (ref 0–99)

## 2019-05-12 MED ORDER — ASPIRIN EC 81 MG PO TBEC
81.0000 mg | DELAYED_RELEASE_TABLET | Freq: Every day | ORAL | Status: DC
  Administered 2019-05-13 – 2019-05-24 (×12): 81 mg via ORAL
  Filled 2019-05-12 (×12): qty 1

## 2019-05-12 MED ORDER — SODIUM CHLORIDE 0.9 % IV SOLN: INTRAVENOUS | Status: DC

## 2019-05-12 MED ORDER — ATORVASTATIN CALCIUM 20 MG PO TABS
40.0000 mg | ORAL_TABLET | Freq: Every day | ORAL | Status: DC
  Administered 2019-05-12 – 2019-05-24 (×13): 40 mg via ORAL
  Filled 2019-05-12 (×14): qty 2

## 2019-05-12 NOTE — Progress Notes (Addendum)
Progress Note    James Ferrell  ZOX:[id number] DOB: 09/05/1975  DOA: 05/09/2019 PCP: Patient, No Pcp Per      Brief Narrative:    Medical records reviewed and are as summarized below:  James Ferrell is an 44 y.o. male with medical history significant for HIV infection, chronic low back pain, stroke (occured during heart valve surgery) and heart valve surgery (does not know which valve), status post pacemaker, who presented to the hospital with fever and chills.  His symptoms started few days ago prior to admission but had progressively worsened.  He developed generalized weakness and dizziness and he felt as though he was going to fall down.  He said he was also exposed to some cleaning chemicals on the day prior to admission when he tried to do some cleaning in the bathroom.  He went to [facility] on the day of admission where his temperature was 103 F in the emergency department.  However, because of long waiting times in the emergency department at [facility], he came to [facility] for further evaluation.  He was tachycardic and hypotensive in the emergency room and was given boluses of IV fluids.  He was initially diagnosed with sepsis of unknown etiology.  He was treated with IV cefepime and vancomycin.  Blood culture grew MSSA and antibiotics were deescalated to IV cefazolin.  ID was consulted to assist with management.  2D echo did not show any evidence of endocarditis so cardiology was consulted for TEE.      Assessment/Plan:   Principal Problem:   Sepsis (HCC) Active Problems:   Bacteremia due to Staphylococcus aureus   Hypotension   AKI (acute kidney injury) (HCC)   Leukocytosis   MSSA sepsis and bacteremia/leukocytosis: Continue IV Ancef.  2D did not show any evidence of endocarditis.  Consulted cardiologist for TEE. Follow-up with ID and cardiologist  Acute kidney injury, hypotension and hyponatremia: Resolved.   Mildly elevated  liver enzymes: Hepatitis panel is negative  History of stroke: Patient was previously on aspirin and Lipitor but stopped taking them months ago (October or November 2020).  Resume aspirin and Lipitor.  Check lipid panel.  Patient is okay with this plan.  HIV infection: HIV RNA is 10,500.  Patient said he takes Atripla at home.  Permanent pacemaker status, history of heart valve surgery.  Order has been placed to obtain records from [facility] in [place].   Previous home medications: His significant other brought some medications that were filled in June 2020.  She said patient used to take them but he stopped taking them months ago.  These were low-dose aspirin, metoprolol, Lipitor, naproxen and cyclobenzaprine (chronic low back pain).    Body mass index is 25.8 kg/m.   Family Communication/Anticipated D/C date and plan/Code Status   DVT prophylaxis: Lovenox Code Status: Full code Family Communication: Plan discussed with patient Disposition Plan:    Status is: Inpatient  Remains inpatient appropriate because:IV treatments appropriate due to intensity of illness or inability to take PO and Inpatient level of care appropriate due to severity of illness   Dispo: The patient is from: Home              Anticipated d/c is to: Home              Anticipated d/c date is: > 3 days              Patient currently is not medically stable  to d/c.            Subjective:   He feels okay today.  No fever or chills.  He is asking when he can be discharged home.  Objective:    Vitals:   05/11/19 2004 05/12/19 0400 05/12/19 0544 05/12/19 1343  BP: 118/77  112/74 109/77  Pulse: 97  87 91  Resp: Temp: 98.9 F (37.2 C)  99 F (37.2 C) 98.3 F (36.8 C)  TempSrc: Oral  Oral Oral  SpO2: 98% 98% 97% 100%  Weight:      Height:        Intake/Output Summary (Last 24 hours) at 05/12/2019 1716 Last data filed at 05/12/2019 1005 Gross per 24 hour  Intake  286.39 ml  Output 1300 ml  Net -1013.61 ml   Filed Weights   05/09/19 1305  Weight: 83.9 kg    Exam:  GEN: No acute distress SKIN: No rash EYES: EOMI ENT: MMM CV: RRR PULM: Clear to auscultation ABD: soft, ND, NT, +BS CNS: AAO x 3, non focal EXT: No edema or tenderness   Data Reviewed:   I have personally reviewed following labs and imaging studies:  Labs: Labs show the following:   Basic Metabolic Panel: Recent Labs  Lab 05/09/19 1309 05/09/19 1309 05/09/19 1600 05/09/19 1600 05/10/19 0533 05/11/19 0409  NA 131*  --  135  --  135 135  K 4.3   < > 4.1   < > 3.6 3.8  CL 103  --  110  --  109 105  CO2 18*  --  20*  --  21* 21*  GLUCOSE 121*  --  114*  --  116* 103*  BUN 18  --  16  --  13 11  CREATININE 1.86*  --  1.69*  --  1.38* 1.22  CALCIUM 9.0  --  7.4*  --  8.3* 8.7*   < > = values in this interval not displayed.   GFR Estimated Creatinine Clearance: 84.9 mL/min (by C-G formula based on SCr of 1.22 mg/dL). Liver Function Tests: Recent Labs  Lab 05/09/19 1600 05/10/19 0533 05/11/19 0409  AST 43* 58* 61*  ALT 47* 60* 61*  ALKPHOS 60 65 67  BILITOT 1.0 1.6* 1.6*  PROT 6.4* 7.1 7.2  ALBUMIN 3.0* 3.2* 3.0*   No results for input(s): LIPASE, AMYLASE in the last 168 hours. No results for input(s): AMMONIA in the last 168 hours. Coagulation profile No results for input(s): INR, PROTIME in the last 168 hours.  CBC: Recent Labs  Lab 05/09/19 1309 05/10/19 0533  WBC 15.8* 9.9  HGB 13.9 12.4*  HCT 42.1 37.9*  MCV 88.4 90.2  PLT 200 156   Cardiac Enzymes: No results for input(s): CKTOTAL, CKMB, CKMBINDEX, TROPONINI in the last 168 hours. BNP (last 3 results) No results for input(s): PROBNP in the last 8760 hours. CBG: No results for input(s): GLUCAP in the last 168 hours. D-Dimer: No results for input(s): DDIMER in the last 72 hours. Hgb A1c: No results for input(s): HGBA1C in the last 72 hours. Lipid Profile: No results for input(s):  CHOL, HDL, LDLCALC, TRIG, CHOLHDL, LDLDIRECT in the last 72 hours. Thyroid function studies: No results for input(s): TSH, T4TOTAL, T3FREE, THYROIDAB in the last 72 hours.  Invalid input(s): FREET3 Anemia work up: No results for input(s): VITAMINB12, FOLATE, FERRITIN, TIBC, IRON, RETICCTPCT in the last 72 hours. Sepsis Labs: Recent Labs  Lab 05/09/19 1309 05/09/19 1332  05/09/19 1600 05/10/19 0533 05/11/19 0409  PROCALCITON  --   --  17.78 21.77 13.97  WBC 15.8*  --   --  9.9  --   LATICACIDVEN  --  1.3  --   --   --     Microbiology Recent Results (from the past 240 hour(s))  Culture, blood (routine x 2)     Status: Abnormal   Collection Time: 05/09/19  1:10 PM   Specimen: BLOOD  Result Value Ref Range Status   Specimen Description   Final    BLOOD RFA Performed at [facility], [address] [place], [place] [address]    Special Requests   Final    BOTTLES DRAWN AEROBIC AND ANAEROBIC Blood Culture results may not be optimal due to an excessive volume of blood received in culture bottles Performed at [facility], [address] [place], [place] [address]    Culture  Setup Time   Final    IN BOTH AEROBIC AND ANAEROBIC BOTTLES GRAM POSITIVE COCCI CRITICAL RESULT CALLED TO, READ BACK BY AND VERIFIED WITH: SCOTT HALL ON 05/10/19 AT 0052 [facility] Performed at [facility], [address] [place], [place] [address]    Culture STAPHYLOCOCCUS AUREUS (A)  Final   Report Status 05/12/2019 FINAL  Final   Organism ID, Bacteria STAPHYLOCOCCUS AUREUS  Final      Susceptibility   Staphylococcus aureus - MIC*    CIPROFLOXACIN >=8 RESISTANT Resistant     ERYTHROMYCIN >=8 RESISTANT Resistant     GENTAMICIN <=0.5 SENSITIVE Sensitive     OXACILLIN <=0.25 SENSITIVE Sensitive     TETRACYCLINE <=1 SENSITIVE Sensitive     VANCOMYCIN 1 SENSITIVE Sensitive     TRIMETH/SULFA <=10 SENSITIVE Sensitive     CLINDAMYCIN <=0.25 SENSITIVE Sensitive     RIFAMPIN <=0.5  SENSITIVE Sensitive     Inducible Clindamycin NEGATIVE Sensitive     * STAPHYLOCOCCUS AUREUS  Blood Culture ID Panel (Reflexed)     Status: Abnormal   Collection Time: 05/09/19  1:10 PM  Result Value Ref Range Status   Enterococcus species NOT DETECTED NOT DETECTED Final   Listeria monocytogenes NOT DETECTED NOT DETECTED Final   Staphylococcus species DETECTED (A) NOT DETECTED Final    Comment: CRITICAL RESULT CALLED TO, READ BACK BY AND VERIFIED WITH: SCOTT HALL ON 05/10/19 AT 0052 [facility]    Staphylococcus aureus (BCID) DETECTED (A) NOT DETECTED Final    Comment: Methicillin (oxacillin) susceptible Staphylococcus aureus (MSSA). Preferred therapy is anti staphylococcal beta lactam antibiotic (Cefazolin or [name]), unless clinically contraindicated. CRITICAL RESULT CALLED TO, READ BACK BY AND VERIFIED WITH: SCOTT HALL ON 05/10/19 AT 0052 [facility]    Methicillin resistance NOT DETECTED NOT DETECTED Final   Streptococcus species NOT DETECTED NOT DETECTED Final   Streptococcus agalactiae NOT DETECTED NOT DETECTED Final   Streptococcus pneumoniae NOT DETECTED NOT DETECTED Final   Streptococcus pyogenes NOT DETECTED NOT DETECTED Final   Acinetobacter baumannii NOT DETECTED NOT DETECTED Final   Enterobacteriaceae species NOT DETECTED NOT DETECTED Final   Enterobacter cloacae complex NOT DETECTED NOT DETECTED Final   Escherichia coli NOT DETECTED NOT DETECTED Final   Klebsiella oxytoca NOT DETECTED NOT DETECTED Final   Klebsiella pneumoniae NOT DETECTED NOT DETECTED Final   Proteus species NOT DETECTED NOT DETECTED Final   Serratia marcescens NOT DETECTED NOT DETECTED Final   Haemophilus influenzae NOT DETECTED NOT DETECTED Final   Neisseria meningitidis NOT DETECTED NOT DETECTED Final   Pseudomonas aeruginosa  NOT DETECTED NOT DETECTED Final   Candida albicans NOT DETECTED NOT DETECTED Final   Candida glabrata NOT DETECTED NOT DETECTED Final   Candida krusei NOT DETECTED NOT DETECTED Final    Candida parapsilosis NOT DETECTED NOT DETECTED Final   Candida tropicalis NOT DETECTED NOT DETECTED Final    Comment: Performed at [facility], [address] [place], [place] [address]  Culture, blood (routine x 2)     Status: Abnormal   Collection Time: 05/09/19  1:52 PM   Specimen: BLOOD  Result Value Ref Range Status   Specimen Description   Final    BLOOD BLOOD LEFT FOREARM Performed at [facility], [address], [place], [place] [address]    Special Requests   Final    BOTTLES DRAWN AEROBIC AND ANAEROBIC Blood Culture adequate volume Performed at [facility], [address] [place], [place] [address]    Culture  Setup Time   Final    IN BOTH AEROBIC AND ANAEROBIC BOTTLES GRAM POSITIVE COCCI CRITICAL VALUE NOTED.  VALUE IS CONSISTENT WITH PREVIOUSLY REPORTED AND CALLED VALUE. Performed at [facility], [address] [place], [place] [address]    Culture (A)  Final    STAPHYLOCOCCUS AUREUS SUSCEPTIBILITIES PERFORMED ON PREVIOUS CULTURE WITHIN THE LAST 5 DAYS. Performed at [facility], [address] [place], [place] [address]    Report Status 05/12/2019 FINAL  Final  Respiratory Panel by RT PCR (Flu A&B, Covid) - Nasopharyngeal Swab     Status: None   Collection Time: 05/09/19  3:11 PM   Specimen: Nasopharyngeal Swab  Result Value Ref Range Status   SARS Coronavirus 2 by RT PCR NEGATIVE NEGATIVE Final    Comment: (NOTE) SARS-CoV-2 target nucleic acids are NOT DETECTED. The SARS-CoV-2 RNA is generally detectable in upper respiratoy specimens during the acute phase of infection. The lowest concentration of SARS-CoV-2 viral copies this assay can detect is 131 copies/mL. A negative result does not preclude SARS-Cov-2 infection and should not be used as the sole basis for treatment or other patient management decisions. A negative result may occur with  improper specimen collection/handling, submission of specimen  other than nasopharyngeal swab, presence of viral mutation(s) within the areas targeted by this assay, and inadequate number of viral copies (<131 copies/mL). A negative result must be combined with clinical observations, patient history, and epidemiological information. The expected result is Negative. Fact Sheet for Patients:  https://www.moore.com/ Fact Sheet for Healthcare Providers:  https://www.young.biz/ This test is not yet ap proved or cleared by the [place] FDA and  has been authorized for detection and/or diagnosis of SARS-CoV-2 by FDA under an Emergency Use Authorization (EUA). This EUA will remain  in effect (meaning this test can be used) for the duration of the COVID-19 declaration under Section 564(b)(1) of the Act, 21 U.S.C. section 360bbb-3(b)(1), unless the authorization is terminated or revoked sooner.    Influenza A by PCR NEGATIVE NEGATIVE Final   Influenza B by PCR NEGATIVE NEGATIVE Final    Comment: (NOTE) The Xpert Xpress SARS-CoV-2/FLU/RSV assay is intended as an aid in  the diagnosis of influenza from Nasopharyngeal swab specimens and  should not be used as a sole basis for treatment. Nasal washings and  aspirates are unacceptable for Xpert Xpress SARS-CoV-2/FLU/RSV  testing. Fact Sheet for Patients: https://www.moore.com/ Fact Sheet for Healthcare Providers: https://www.young.biz/ This test is not yet approved or cleared by the [place] FDA and  has been authorized for detection  and/or diagnosis of SARS-CoV-2 by  FDA under an Emergency Use Authorization (EUA). This EUA will remain  in effect (meaning this test can be used) for the duration of the  Covid-19 declaration under Section 564(b)(1) of the Act, 21  U.S.C. section 360bbb-3(b)(1), unless the authorization is  terminated or revoked. Performed at [facility], [address] [place], [place]  [address]   CULTURE, BLOOD (ROUTINE X 2) w Reflex to ID Panel     Status: None (Preliminary result)   Collection Time: 05/12/19 12:38 AM   Specimen: BLOOD  Result Value Ref Range Status   Specimen Description BLOOD RAC  Final   Special Requests BOTTLES DRAWN AEROBIC AND ANAEROBIC BCAV  Final   Culture   Final    NO GROWTH < 12 HOURS Performed at [facility], [address] [place], [place] [address]    Report Status PENDING  Incomplete  CULTURE, BLOOD (ROUTINE X 2) w Reflex to ID Panel     Status: None (Preliminary result)   Collection Time: 05/12/19 12:46 AM   Specimen: BLOOD  Result Value Ref Range Status   Specimen Description BLOOD LAC  Final   Special Requests BOTTLES DRAWN AEROBIC AND ANAEROBIC BCAV  Final   Culture   Final    NO GROWTH < 12 HOURS Performed at [facility], [address] [place], [place] [address]    Report Status PENDING  Incomplete    Procedures and diagnostic studies:  ECHOCARDIOGRAM COMPLETE  Result Date: 05/11/2019    ECHOCARDIOGRAM REPORT   Patient Name:   James MinorsCORNELIUS Channing Date of Exam: 05/11/2019 Medical Rec #:  [id number]        Height:       71.0 in Accession #:    [id number]       Weight:       185.0 lb Date of Birth:  08/12/1975        BSA:          2.040 m Patient Age:    43 years         BP:           108/70 mmHg Patient Gender: M                HR:           105 bpm. Exam Location:  [facility] Procedure: 2D Echo, Color Doppler and Cardiac Doppler Indications:     Bacteremia 790.7  History:         Patient has no prior history of Echocardiogram examinations.                  Stroke.  Sonographer:     Cristela BlueJerry Hege RDCS (AE) Referring Phys:  [id number] Lurene ShadowBERNARD Akina Maish Diagnosing Phys: Lorine BearsMuhammad Arida MD IMPRESSIONS  1. Left ventricular ejection fraction, by estimation, is 50 to 55%. The left ventricle has low normal function. The left ventricle has no regional wall motion abnormalities. The left ventricular internal cavity size was mildly dilated.  Left ventricular diastolic parameters are indeterminate.  2. Right ventricular systolic function is normal. The right ventricular size is normal. There is normal pulmonary artery systolic pressure.  3. Right atrial size was mildly dilated.  4. The mitral valve is normal in structure. Trivial mitral valve regurgitation. No evidence of mitral stenosis.  5. The aortic valve is abnormal. Aortic valve regurgitation is trivial. No aortic stenosis is present. Aortic valve is not well visualized but suspect there is a bioprosthesis.  6. 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  7. Rcommend a TEE if there is a concern for endocarditis. FINDINGS  Left Ventricle: Left ventricular ejection fraction, by estimation, is 50 to 55%. The left ventricle has low normal function. The left ventricle has no regional wall motion abnormalities. The left ventricular internal cavity size was mildly dilated. There is no left ventricular hypertrophy. Left ventricular diastolic parameters are indeterminate. Right Ventricle: The right ventricular size is normal. No increase in right ventricular wall thickness. Right ventricular systolic function is normal. There is normal pulmonary artery systolic pressure. The tricuspid regurgitant velocity is 1.81 m/s, and  with an assumed right atrial pressure of 10 mmHg, the estimated right ventricular systolic pressure is 81.1 mmHg. Left Atrium: Left atrial size was normal in size. Right Atrium: Right atrial size was mildly dilated. Pericardium: There is no evidence of pericardial effusion. Mitral Valve: The mitral valve is normal in structure. Normal mobility of the mitral valve leaflets. Trivial mitral valve regurgitation. No evidence of mitral valve stenosis. Tricuspid Valve: The tricuspid valve is normal in structure. Tricuspid valve regurgitation is trivial. No evidence of tricuspid stenosis. Aortic Valve: The aortic valve is abnormal. . There is moderate  thickening of the aortic valve. Aortic valve regurgitation is trivial. No aortic stenosis is present. There is moderate thickening of the aortic valve. Aortic valve mean gradient measures 5.7 mmHg. Aortic valve peak gradient measures 9.5 mmHg. Aortic valve area, by VTI measures 1.93 cm. Pulmonic Valve: The pulmonic valve was normal in structure. Pulmonic valve regurgitation is not visualized. No evidence of pulmonic stenosis. Aorta: The aortic root is normal in size and structure. 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 Venous: The inferior vena cava was not well visualized. IAS/Shunts: No atrial level shunt detected by color flow Doppler.  LEFT VENTRICLE PLAX 2D LVIDd:         5.73 cm  Diastology LVIDs:         3.71 cm  LV e' lateral:   16.30 cm/s LV PW:         1.40 cm  LV E/e' lateral: 6.1 LV IVS:        0.85 cm  LV e' medial:    9.68 cm/s LVOT diam:     2.00 cm  LV E/e' medial:  10.3 LV SV:         52 LV SV Index:   25 LVOT Area:     3.14 cm  RIGHT VENTRICLE RV Basal diam:  4.24 cm RV S prime:     7.18 cm/s TAPSE (M-mode): 3.6 cm LEFT ATRIUM             Index       RIGHT ATRIUM           Index LA diam:        3.90 cm 1.91 cm/m  RA Area:     22.40 cm LA Vol (A2C):   78.7 ml 38.58 ml/m RA Volume:   74.60 ml  36.57 ml/m LA Vol (A4C):   37.8 ml 18.53 ml/m LA Biplane Vol: 57.0 ml 27.94 ml/m  AORTIC VALVE                    PULMONIC VALVE AV Area (Vmax):    1.66 cm     PV Vmax:        0.79 m/s AV Area (Vmean):  1.65 cm     PV Peak grad:   2.5 mmHg AV Area (VTI):     1.93 cm     RVOT Peak grad: 3 mmHg AV Vmax:           154.00 cm/s AV Vmean:          106.667 cm/s AV VTI:            0.267 m AV Peak Grad:      9.5 mmHg AV Mean Grad:      5.7 mmHg LVOT Vmax:         81.30 cm/s LVOT Vmean:        56.000 cm/s LVOT VTI:          0.164 m LVOT/AV VTI ratio: 0.61  AORTA Ao Root diam: 4.75 cm MITRAL VALVE               TRICUSPID VALVE MV Area (PHT): 4.10 cm    TR Peak  grad:   13.1 mmHg MV Decel Time: 185 msec    TR Vmax:        181.00 cm/s MV E velocity: 99.90 cm/s MV A velocity: 65.00 cm/s  SHUNTS MV E/A ratio:  1.54        Systemic VTI:  0.16 m                            Systemic Diam: 2.00 cm Lorine Bears MD Electronically signed by Lorine Bears MD Signature Date/Time: 05/11/2019/2:49:53 PM    Final     Medications:   . [START ON 05/13/2019] aspirin EC  81 mg Oral Daily  . atorvastatin  40 mg Oral QHS  . heparin  5,000 Units Subcutaneous Q8H   Continuous Infusions: . sodium chloride    .  ceFAZolin (ANCEF) IV 2 g (05/12/19 1349)     LOS: 3 days   Dennice Tindol  [facility]     05/12/2019, 5:16 PM

## 2019-05-12 NOTE — TOC Initial Note (Signed)
Transition of Care [facility]) - Initial/Assessment Note    Patient Details  Name: Ferrell Ferrell MRN: [id number] Date of Birth: 1975/12/19  [facility] CM/SW Contact:    Ferrell Ammons, RN Phone Number: 05/12/2019, 9:35 AM  Clinical Narrative:    RNCM met with patient at bedside. Patient came to hospital due to weakness and lethargy, found to be Septic and is currently going through further testing including TEE today. RNCM explained role and what would be discussed and patient was agreeable to talk. Discussed that consult had been received for assistance with setting up a PCP. Patient reports that his address is [name] however he believes he lives in [place]. He was originally set up with [facility] but reports not seeing them and would prefer to get set up in with someone in [place]. Discussed need to contact Medicaid Case worker but patient is unsure of their name.   RNCM placed call to [facility] and was informed that patient is actually followed by Ferrell Ferrell out of [place] call and left VM for return call [phone].   RNCM will continue to follow.       Expected Discharge Plan: Home/Self Care Barriers to Discharge: Continued Medical Work up   Patient Goals and CMS Choice        Expected Discharge Plan and Services Expected Discharge Plan: Home/Self Care                                              Prior Living Arrangements/Services   Lives with:: Self Patient language and need for interpreter reviewed:: Yes Do you feel safe going back to the place where you live?: Yes      Need for Family Participation in Patient Care: Yes (Comment) Care giver support system in place?: Yes (comment)   Criminal Activity/Legal Involvement Pertinent to Current Situation/Hospitalization: No - Comment as needed  Activities of Daily Living Home Assistive Devices/Equipment: None ADL Screening (condition at time of admission) Patient's  cognitive ability adequate to safely complete daily activities?: Yes Is the patient deaf or have difficulty hearing?: No Does the patient have difficulty seeing, even when wearing glasses/contacts?: No Does the patient have difficulty concentrating, remembering, or making decisions?: No Patient able to express need for assistance with ADLs?: Yes Does the patient have difficulty dressing or bathing?: No Independently performs ADLs?: Yes (appropriate for developmental age) Does the patient have difficulty walking or climbing stairs?: Yes Weakness of Legs: None Weakness of Arms/Hands: None  Permission Sought/Granted                  Emotional Assessment Appearance:: Appears stated age Attitude/Demeanor/Rapport: Engaged, Gracious Affect (typically observed): Appropriate Orientation: : Oriented to Self, Oriented to Place, Oriented to  Time, Oriented to Situation Alcohol / Substance Use: Not Applicable Psych Involvement: No (comment)  Admission diagnosis:  Elevated troponin [R77.8] Sepsis ([place]) [A41.9] Sepsis with acute organ dysfunction and septic shock, due to unspecified organism, unspecified type ([place]) [A41.9, R65.21] Patient Active Problem List   Diagnosis Date Noted  . Bacteremia due to Staphylococcus aureus 05/10/2019  . Sepsis ([place]) 05/09/2019  . Leukocytosis 05/09/2019  . Hypotension 05/09/2019  . AKI (acute kidney injury) ([place]) 05/09/2019   PCP:  Patient, No Pcp Per Pharmacy:   [id number] [place]  [place] [address] Phone: [phone] Fax: [phone]     Social Determinants of Health (SDOH) Interventions    Readmission Risk Interventions No flowsheet data found.

## 2019-05-12 NOTE — TOC Progression Note (Signed)
Transition of Care [facility]) - Progression Note    Patient Details  Name: Izaia Say MRN: [id number] Date of Birth: 1975/02/25  Transition of Care [facility]) CM/SW Contact  Trenton Founds, RN Phone Number: 05/12/2019, 11:21 AM  Clinical Narrative:   RNCM contacted [place] DSS, was informed that patient's case worker is actually in [place]. Placed call to [place] at [phone] and left VM message.  Provided patient with copy of [employer] book as well as contact information for [employer] per his request. Educated patient that this CM had left message for his case worker but he may want to contact her himself as typically requests for change in PCP needs to come from him.  RNCM will follow for any needs.     Expected Discharge Plan: Home/Self Care Barriers to Discharge: Continued Medical Work up  Expected Discharge Plan and Services Expected Discharge Plan: Home/Self Care                                               Social Determinants of Health (SDOH) Interventions    Readmission Risk Interventions No flowsheet data found.

## 2019-05-12 NOTE — Consult Note (Signed)
James Ferrell is a 44 y.o. male  [id number]  Primary Cardiologist: Neoma Laming Reason for Consultation: TEE  HPI: Patient is a 44 year old African-American male with past medical history of stroke, mitral valve replacement,  HIV, and drug use.  Patient admitted to the hospital with generalized weakness and dizziness and was found to be septic with MSSA.  We have been consulted to perform a TEE to rule out endocarditis as he is immunocompromised with sepsis and a history of mitral valve replacement and permanent pacemaker.   Review of Systems: Patient denies chest pain, shortness of breath, orthopnea, or PND.   Past Medical History:  Diagnosis Date  . Stroke [facility])     Medications Prior to Admission  Medication Sig Dispense Refill  . ibuprofen (ADVIL) 200 MG tablet Take 600-800 mg by mouth every 6 (six) hours as needed for fever or moderate pain.       . heparin  5,000 Units Subcutaneous Q8H    Infusions: .  ceFAZolin (ANCEF) IV Stopped (05/12/19 0525)    No Known Allergies  Social History   Socioeconomic History  . Marital status: Significant Other    Spouse name: Not on file  . Number of children: Not on file  . Years of education: Not on file  . Highest education level: Not on file  Occupational History  . Not on file  Tobacco Use  . Smoking status: Current Every Day Smoker  . Smokeless tobacco: Never Used  Substance and Sexual Activity  . Alcohol use: Yes  . Drug use: Not on file  . Sexual activity: Not on file  Other Topics Concern  . Not on file  Social History Narrative  . Not on file   Social Determinants of Health   Financial Resource Strain:   . Difficulty of Paying Living Expenses:   Food Insecurity:   . Worried About Charity fundraiser in the Last Year:   . Arboriculturist in the Last Year:   Transportation Needs:   . Film/video editor (Medical):   Marland Kitchen Lack of Transportation (Non-Medical):   Physical Activity:   . Days of Exercise  per Week:   . Minutes of Exercise per Session:   Stress:   . Feeling of Stress :   Social Connections:   . Frequency of Communication with Friends and Family:   . Frequency of Social Gatherings with Friends and Family:   . Attends Religious Services:   . Active Member of Clubs or Organizations:   . Attends Archivist Meetings:   Marland Kitchen Marital Status:   Intimate Partner Violence:   . Fear of Current or Ex-Partner:   . Emotionally Abused:   Marland Kitchen Physically Abused:   . Sexually Abused:     Family History  Problem Relation Age of Onset  . Heart disease Neg Hx     PHYSICAL EXAM: Vitals:   05/12/19 0400 05/12/19 0544  BP:  112/74  Pulse:  87  Resp:  18  Temp:  99 F (37.2 C)  SpO2: 98% 97%     Intake/Output Summary (Last 24 hours) at 05/12/2019 1002 Last data filed at 05/12/2019 0544 Gross per 24 hour  Intake 406.39 ml  Output 2600 ml  Net -2193.61 ml    General:  Well appearing. No respiratory difficulty HEENT: normal Neck: supple. no JVD. Carotids 2+ bilat; no bruits. No lymphadenopathy or thryomegaly appreciated. Cor: PMI nondisplaced. Regular rate & rhythm. No rubs, gallops or murmurs. Lungs:  clear Abdomen: soft, nontender, nondistended. No hepatosplenomegaly. No bruits or masses. Good bowel sounds. Extremities: no cyanosis, clubbing, rash, edema Neuro: alert & oriented x 3, cranial nerves grossly intact. moves all 4 extremities w/o difficulty. Affect pleasant.  ECG: Most recent EKG shows a biventricular paced rhythm of 107.  Results for orders placed or performed during the hospital encounter of 05/09/19 (from the past 24 hour(s))  CULTURE, BLOOD (ROUTINE X 2) w Reflex to ID Panel     Status: None (Preliminary result)   Collection Time: 05/12/19 12:38 AM   Specimen: BLOOD  Result Value Ref Range   Specimen Description BLOOD RAC    Special Requests BOTTLES DRAWN AEROBIC AND ANAEROBIC BCAV    Culture      NO GROWTH < 12 HOURS Performed at [facility], [address] [place], [place] [address]    Report Status PENDING   CULTURE, BLOOD (ROUTINE X 2) w Reflex to ID Panel     Status: None (Preliminary result)   Collection Time: 05/12/19 12:46 AM   Specimen: BLOOD  Result Value Ref Range   Specimen Description BLOOD LAC    Special Requests BOTTLES DRAWN AEROBIC AND ANAEROBIC BCAV    Culture      NO GROWTH < 12 HOURS Performed at [facility], [address] [place], [place] [address]    Report Status PENDING    ECHOCARDIOGRAM COMPLETE  Result Date: 05/11/2019    ECHOCARDIOGRAM REPORT   Patient Name:   James Ferrell Date of Exam: 05/11/2019 Medical Rec #:  [id number]        Height:       71.0 in Accession #:    [id number]       Weight:       185.0 lb Date of Birth:  04/24/1975        BSA:          2.040 m Patient Age:    43 years         BP:           108/70 mmHg Patient Gender: M                HR:           105 bpm. Exam Location:  [facility] Procedure: 2D Echo, Color Doppler and Cardiac Doppler Indications:     Bacteremia 790.7  History:         Patient has no prior history of Echocardiogram examinations.                  Stroke.  Sonographer:     Cristela Blue RDCS (AE) Referring Phys:  [id number] Lurene Shadow Diagnosing Phys: Lorine Bears MD IMPRESSIONS  1. Left ventricular ejection fraction, by estimation, is 50 to 55%. The left ventricle has low normal function. The left ventricle has no regional wall motion abnormalities. The left ventricular internal cavity size was mildly dilated. Left ventricular diastolic parameters are indeterminate.  2. Right ventricular systolic function is normal. The right ventricular size is normal. There is normal pulmonary artery systolic pressure.  3. Right atrial size was mildly dilated.  4. The mitral valve is normal in structure. Trivial mitral valve regurgitation. No evidence of mitral stenosis.  5. The aortic valve is abnormal. Aortic valve regurgitation is trivial. No aortic stenosis is present.  Aortic valve is not well visualized but suspect there is a bioprosthesis.  6. There is moderate to severe dilatation of the aortic root and of  the ascending aorta.  7. Rcommend a TEE if there is a concern for endocarditis. FINDINGS  Left Ventricle: Left ventricular ejection fraction, by estimation, is 50 to 55%. The left ventricle has low normal function. The left ventricle has no regional wall motion abnormalities. The left ventricular internal cavity size was mildly dilated. There is no left ventricular hypertrophy. Left ventricular diastolic parameters are indeterminate. Right Ventricle: The right ventricular size is normal. No increase in right ventricular wall thickness. Right ventricular systolic function is normal. There is normal pulmonary artery systolic pressure. The tricuspid regurgitant velocity is 1.81 m/s, and  with an assumed right atrial pressure of 10 mmHg, the estimated right ventricular systolic pressure is 23.1 mmHg. Left Atrium: Left atrial size was normal in size. Right Atrium: Right atrial size was mildly dilated. Pericardium: There is no evidence of pericardial effusion. Mitral Valve: The mitral valve is normal in structure. Normal mobility of the mitral valve leaflets. Trivial mitral valve regurgitation. No evidence of mitral valve stenosis. Tricuspid Valve: The tricuspid valve is normal in structure. Tricuspid valve regurgitation is trivial. No evidence of tricuspid stenosis. Aortic Valve: The aortic valve is abnormal. . There is moderate thickening of the aortic valve. Aortic valve regurgitation is trivial. No aortic stenosis is present. There is moderate thickening of the aortic valve. Aortic valve mean gradient measures 5.7 mmHg. Aortic valve peak gradient measures 9.5 mmHg. Aortic valve area, by VTI measures 1.93 cm. Pulmonic Valve: The pulmonic valve was normal in structure. Pulmonic valve regurgitation is not visualized. No evidence of pulmonic stenosis. Aorta: The aortic root is  normal in size and structure. 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 Venous: The inferior vena cava was not well visualized. IAS/Shunts: No atrial level shunt detected by color flow Doppler.  LEFT VENTRICLE PLAX 2D LVIDd:         5.73 cm  Diastology LVIDs:         3.71 cm  LV e' lateral:   16.30 cm/s LV PW:         1.40 cm  LV E/e' lateral: 6.1 LV IVS:        0.85 cm  LV e' medial:    9.68 cm/s LVOT diam:     2.00 cm  LV E/e' medial:  10.3 LV SV:         52 LV SV Index:   25 LVOT Area:     3.14 cm  RIGHT VENTRICLE RV Basal diam:  4.24 cm RV S prime:     7.18 cm/s TAPSE (M-mode): 3.6 cm LEFT ATRIUM             Index       RIGHT ATRIUM           Index LA diam:        3.90 cm 1.91 cm/m  RA Area:     22.40 cm LA Vol (A2C):   78.7 ml 38.58 ml/m RA Volume:   74.60 ml  36.57 ml/m LA Vol (A4C):   37.8 ml 18.53 ml/m LA Biplane Vol: 57.0 ml 27.94 ml/m  AORTIC VALVE                    PULMONIC VALVE AV Area (Vmax):    1.66 cm     PV Vmax:        0.79 m/s AV Area (Vmean):   1.65 cm     PV Peak grad:  2.5 mmHg AV Area (VTI):     1.93 cm     RVOT Peak grad: 3 mmHg AV Vmax:           154.00 cm/s AV Vmean:          106.667 cm/s AV VTI:            0.267 m AV Peak Grad:      9.5 mmHg AV Mean Grad:      5.7 mmHg LVOT Vmax:         81.30 cm/s LVOT Vmean:        56.000 cm/s LVOT VTI:          0.164 m LVOT/AV VTI ratio: 0.61  AORTA Ao Root diam: 4.75 cm MITRAL VALVE               TRICUSPID VALVE MV Area (PHT): 4.10 cm    TR Peak grad:   13.1 mmHg MV Decel Time: 185 msec    TR Vmax:        181.00 cm/s MV E velocity: 99.90 cm/s MV A velocity: 65.00 cm/s  SHUNTS MV E/A ratio:  1.54        Systemic VTI:  0.16 m                            Systemic Diam: 2.00 cm Lorine Bears MD Electronically signed by Lorine Bears MD Signature Date/Time: 05/11/2019/2:49:53 PM    Final      ASSESSMENT AND PLAN: Patient admitted for MSSA sepsis with concern for possible endocarditis.  Most recent  echocardiogram showed an EF 50 to 55% with moderate to severe aortic root dilation and moderate thickening of the aortic valve.  With these results there is still a possibility of endocarditis therefore we will perform a transesophageal echocardiogram.  Procedure is scheduled for tomorrow morning. please have the patient be n.p.o. at midnight.  Maryelizabeth Kaufmann NP-C

## 2019-05-12 NOTE — Progress Notes (Signed)
ID MSSA bacteremia HIV Pacemaker previous endocarditis Valve surgery Need records from [facility] TEE is pending Pacemaker will have to be removed in toto Will start HAART once we receive records HE was on symtuza but has not taken it in a long time Says he was taking atripla left over until 1 month ago Depression- recommend psychiatrist consult/counseling Continue cefazolin Repeat blood culture sent today Discussed with patient

## 2019-05-13 ENCOUNTER — Encounter
Admission: EM | Disposition: A | Discharge status: Other Acute Inpatient Hospital | Payer: Self-pay | Source: Home / Self Care | Attending: Internal Medicine

## 2019-05-13 ENCOUNTER — Inpatient Hospital Stay
Admit: 2019-05-13 | Discharge: 2019-05-13 | Disposition: A | Discharge status: Home / Self Care | Payer: Medicaid Other | Attending: Cardiovascular Disease | Admitting: Cardiovascular Disease

## 2019-05-13 DIAGNOSIS — I330 Acute and subacute infective endocarditis: Secondary | ICD-10-CM

## 2019-05-13 DIAGNOSIS — B20 Human immunodeficiency virus [HIV] disease: Secondary | ICD-10-CM

## 2019-05-13 DIAGNOSIS — Z9889 Other specified postprocedural states: Secondary | ICD-10-CM

## 2019-05-13 DIAGNOSIS — F329 Major depressive disorder, single episode, unspecified: Secondary | ICD-10-CM

## 2019-05-13 DIAGNOSIS — F419 Anxiety disorder, unspecified: Secondary | ICD-10-CM

## 2019-05-13 DIAGNOSIS — Z8679 Personal history of other diseases of the circulatory system: Secondary | ICD-10-CM

## 2019-05-13 HISTORY — PX: TEE WITHOUT CARDIOVERSION: SHX5443

## 2019-05-13 SURGERY — ECHOCARDIOGRAM, TRANSESOPHAGEAL
Anesthesia: Moderate Sedation

## 2019-05-13 MED ORDER — MIDAZOLAM HCL 5 MG/5ML IJ SOLN
INTRAMUSCULAR | Status: AC | PRN
  Administered 2019-05-13: 1 mg via INTRAVENOUS
  Administered 2019-05-13: 2 mg via INTRAVENOUS

## 2019-05-13 MED ORDER — FENTANYL CITRATE (PF) 100 MCG/2ML IJ SOLN
INTRAMUSCULAR | Status: AC | PRN
  Administered 2019-05-13 (×2): 50 ug via INTRAVENOUS

## 2019-05-13 MED ORDER — LIDOCAINE VISCOUS HCL 2 % MT SOLN
OROMUCOSAL | Status: AC | PRN
  Administered 2019-05-13: 15 mL via OROMUCOSAL

## 2019-05-13 MED ORDER — FENTANYL CITRATE (PF) 100 MCG/2ML IJ SOLN
INTRAMUSCULAR | Status: AC
  Filled 2019-05-13: qty 2

## 2019-05-13 MED ORDER — BUTAMBEN-TETRACAINE-BENZOCAINE 2-2-14 % EX AERO
INHALATION_SPRAY | CUTANEOUS | Status: AC
  Filled 2019-05-13: qty 5

## 2019-05-13 MED ORDER — LIDOCAINE VISCOUS HCL 2 % MT SOLN
OROMUCOSAL | Status: AC
  Filled 2019-05-13: qty 15

## 2019-05-13 MED ORDER — MIDAZOLAM HCL 5 MG/5ML IJ SOLN
INTRAMUSCULAR | Status: AC
  Filled 2019-05-13: qty 5

## 2019-05-13 MED ORDER — BUTAMBEN-TETRACAINE-BENZOCAINE 2-2-14 % EX AERO
INHALATION_SPRAY | CUTANEOUS | Status: AC | PRN
  Administered 2019-05-13: 5 via TOPICAL

## 2019-05-13 MED ORDER — SODIUM CHLORIDE FLUSH 0.9 % IV SOLN
INTRAVENOUS | Status: AC
  Filled 2019-05-13: qty 10

## 2019-05-13 MED ORDER — ENOXAPARIN SODIUM 40 MG/0.4ML ~~LOC~~ SOLN
40.0000 mg | SUBCUTANEOUS | Status: DC
  Administered 2019-05-14 – 2019-05-18 (×4): 40 mg via SUBCUTANEOUS
  Filled 2019-05-13 (×6): qty 0.4

## 2019-05-13 NOTE — Progress Notes (Signed)
*  PRELIMINARY RESULTS* Echocardiogram Echocardiogram Transesophageal has been performed.  Cristela Blue 05/13/2019, 8:33 AM

## 2019-05-13 NOTE — Sedation Documentation (Signed)
Total conscious sedation: Versed 3 mg IV, Fentanyl 100 mcg IV. Pt. Tolerated procedure well.  

## 2019-05-13 NOTE — Progress Notes (Signed)
SUBJECTIVE: Patient is stable during the procedure   Vitals:   05/13/19 0827 05/13/19 0830 05/13/19 0845 05/13/19 0900  BP: 97/69 107/73 101/67 104/73  Pulse: 95 97 88 88  Resp: 17 14 (!) 24 18  Temp:      TempSrc:      SpO2: 94% 94% 96% 96%  Weight:      Height:        Intake/Output Summary (Last 24 hours) at 05/13/2019 0916 Last data filed at 05/13/2019 0322 Gross per 24 hour  Intake 556.51 ml  Output 0 ml  Net 556.51 ml    LABS: Basic Metabolic Panel: Recent Labs    05/11/19 0409  NA 135  K 3.8  CL 105  CO2 21*  GLUCOSE 103*  BUN 11  CREATININE 1.22  CALCIUM 8.7*   Liver Function Tests: Recent Labs    05/11/19 0409  AST 61*  ALT 61*  ALKPHOS 67  BILITOT 1.6*  PROT 7.2  ALBUMIN 3.0*   No results for input(s): LIPASE, AMYLASE in the last 72 hours. CBC: No results for input(s): WBC, NEUTROABS, HGB, HCT, MCV, PLT in the last 72 hours. Cardiac Enzymes: No results for input(s): CKTOTAL, CKMB, CKMBINDEX, TROPONINI in the last 72 hours. BNP: Invalid input(s): POCBNP D-Dimer: No results for input(s): DDIMER in the last 72 hours. Hemoglobin A1C: No results for input(s): HGBA1C in the last 72 hours. Fasting Lipid Panel: Recent Labs    05/12/19 0038  CHOL 166  HDL 13*  LDLCALC UNABLE TO CALCULATE IF TRIGLYCERIDE OVER 400 mg/dL  TRIG 102*  CHOLHDL 72.5  LDLDIRECT 35.7   Thyroid Function Tests: No results for input(s): TSH, T4TOTAL, T3FREE, THYROIDAB in the last 72 hours.  Invalid input(s): FREET3 Anemia Panel: No results for input(s): VITAMINB12, FOLATE, FERRITIN, TIBC, IRON, RETICCTPCT in the last 72 hours.   PHYSICAL EXAM General: Well developed, well nourished, in no acute distress HEENT:  Normocephalic and atramatic Neck:  No JVD.  Lungs: Clear bilaterally to auscultation and percussion. Heart: HRRR . Normal S1 and S2 without gallops or murmurs.  Abdomen: Bowel sounds are positive, abdomen soft and non-tender  Msk:  Back normal, normal  gait. Normal strength and tone for age. Extremities: No clubbing, cyanosis or edema.   Neuro: Alert and oriented X 3. Psych:  Good affect, responds appropriately  TELEMETRY: Sinus rhythm  ASSESSMENT AND PLAN: Transesophageal echocardiogram was done without complication and there was a vegetation noted moderate size on aortic leaflet.  Please see the full report of TEE.  Principal Problem:   Sepsis (HCC) Active Problems:   Leukocytosis   Hypotension   AKI (acute kidney injury) (HCC)   Bacteremia due to Staphylococcus aureus    Adrian Blackwater A, MD, [facility] 05/13/2019 9:16 AM

## 2019-05-13 NOTE — Progress Notes (Addendum)
Pharmacy Antibiotic Note  James Ferrell is a 44 y.o. male admitted on 05/09/2019 with MSSA bacteremia.  Pharmacy was consulted for cefazolin dosing. This is day  #4 of IV cefazolin, there is no residual leukocytosis or recent febrile episodes and his renal function has improved to what appears to be his baseline level. Newly drawn blood cultures are negative to date. TEE this morning reveals a  2x2cm on non coronary cusp of AV  Plan: continue cefazolin 2 grams IV every 8 hours  Height: 5\' 11"  (180.3 cm) Weight: 83.9 kg (184 lb 15.5 oz) IBW/kg (Calculated) : 75.3  Temp (24hrs), Avg:98.5 F (36.9 C), Min:98.3 F (36.8 C), Max:98.7 F (37.1 C)  Recent Labs  Lab 05/09/19 1309 05/09/19 1332 05/09/19 1600 05/10/19 0533 05/11/19 0409  WBC 15.8*  --   --  9.9  --   CREATININE 1.86*  --  1.69* 1.38* 1.22  LATICACIDVEN  --  1.3  --   --   --     Estimated Creatinine Clearance: 84.9 mL/min (by C-G formula based on SCr of 1.22 mg/dL).    No Known Allergies  Antimicrobials this admission: 4/17 cefepime x 1 4/17 vancomycin x1 Cefazolin 4/18 >>   Microbiology results: 4/20 BCx: NGTD 4/17 BCx: 4/4 MSSA  4/17 SARS CoV-2: negative  4/17 influenza A/B: negative  Thank you for allowing pharmacy to be a part of this patient's care.  5/17 05/13/2019 7:52 AM

## 2019-05-13 NOTE — Consult Note (Signed)
Patient ID: James Ferrell, male   DOB: 1975-11-24, 44 y.o.   MRN: [id number]  Chief Complaint  Patient presents with  . Weakness    Referred By Dr. 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  Darrold Bezek is a 44 y.o. male.  History of valve surgery and pacemaker implant at [facility] in Feb 2020.  3 month hospital stay.  Perioperative stroke.  Moved here in June 2020.  Has not seen any physician since his move to [facility].  Was not placed on any anticoagulants at discharge.  Denies IV drug use.  Denies HIV status.  Presents with fever and feeling of being unwell.  Details of hospitalization at [facility] are unknown.  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   Past Medical History:  Diagnosis Date  . Stroke [facility])     Past Surgical History:  Procedure Laterality Date  . CARDIAC VALVE SURGERY      Family History  Problem Relation Age of Onset  . Heart disease Neg Hx     Social History Social History   Tobacco Use  . Smoking status: Current Every Day Smoker  . Smokeless tobacco: Never Used  Substance Use Topics  . Alcohol use: Yes  . Drug use: Not on file    No Known Allergies  Current Facility-Administered Medications  Medication Dose Route Frequency Provider Last Rate Last Admin  . acetaminophen (TYLENOL) tablet 650 mg  650 mg Oral Q6H PRN Lurene Shadow, MD   650 mg at 05/13/19 1649   Or  . acetaminophen (TYLENOL) suppository 650 mg  650 mg Rectal Q6H PRN Lurene Shadow, MD      . aspirin EC tablet 81 mg  81 mg Oral Daily Lurene Shadow, MD   81 mg at 05/13/19 0934  . atorvastatin (LIPITOR) tablet 40 mg  40 mg Oral QHS Lurene Shadow, MD   40 mg at 05/12/19 2112  . butamben-tetracaine-benzocaine (CETACAINE) 02-24-12 % spray           . ceFAZolin (ANCEF) IVPB 2g/100 mL premix  2 g Intravenous Q8H Hall, Scott A, RPH    Stopped at 05/13/19 1128  . [START ON 05/14/2019] enoxaparin (LOVENOX) injection 40 mg  40 mg Subcutaneous Q24H Esaw Grandchild A, DO      . fentaNYL (SUBLIMAZE) 100 MCG/2ML injection           . lidocaine (XYLOCAINE) 2 % viscous mouth solution           . midazolam (VERSED) 5 MG/5ML injection           . ondansetron (ZOFRAN) tablet 4 mg  4 mg Oral Q6H PRN Lurene Shadow, MD       Or  . ondansetron [facility]) injection 4 mg  4 mg Intravenous Q6H PRN Lurene Shadow, MD      . sodium chloride flush 0.9 % injection               Review of Systems A complete review of systems was asked and was negative except for the following positive findings fever  Blood pressure 102/63, pulse 97, temperature 98.6 F (37 C), temperature source Oral, resp. rate 16, height 5\' 11"  (1.803 m), weight 83.9 kg, SpO2 98 %.  Physical Exam CONSTITUTIONAL:  Pleasant, well-developed, well-nourished, and in no acute distress. EYES: Pupils equal and reactive to light, Sclera non-icteric EARS, NOSE, MOUTH AND THROAT:  The  oropharynx was clear.  Dentition is good repair.  Oral mucosa pink and moist. LYMPH NODES:  Lymph nodes in the neck and axillae were normal RESPIRATORY:  Lungs were clear.  Normal respiratory effort without pathologic use of accessory muscles of respiration CARDIOVASCULAR: Heart was regular without murmurs.  There were no carotid bruits. GI: The abdomen was soft, nontender, and nondistended. There were no palpable masses. There was no hepatosplenomegaly. There were normal bowel sounds in all quadrants. GU:  Rectal deferred.   MUSCULOSKELETAL:  Normal muscle strength and tone.  No clubbing or cyanosis.   SKIN:  There were no pathologic skin lesions.  There were no nodules on palpation. NEUROLOGIC:  Sensation is normal.  Cranial nerves are grossly intact. PSYCH:  Oriented to person, place and time.  Mood and affect are normal. Sternotomy incision without complicating features.  Sternum stable.  No  click   Data Reviewed chart  I have personally reviewed the patient's imaging, laboratory findings and medical records.    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  I would advise that we ask the surgeons at [facility] to review his records from [facility] once they are available to determine whether antibiotic therapy alone will suffice or if he will need redo valve replacement with possible root replacement.         Nestor Lewandowsky, MD 05/13/2019, 7:09 PM

## 2019-05-13 NOTE — Progress Notes (Signed)
ID Had TEE today No distress but worried  BP 102/63 (BP Location: Left Arm)   Pulse 97   Temp 98.6 F (37 C) (Oral)   Resp 16   Ht 5\' 11"  (1.803 m)   Wt 83.9 kg   SpO2 98%   BMI 25.80 kg/m    On examination awake and alert Heart sounds S1-S2 systolic murmur present Chest bilateral air entry   Labs  CBC Latest Ref Rng & Units 05/10/2019 05/09/2019  WBC 4.0 - 10.5 K/uL 9.9 15.8(H)  Hemoglobin 13.0 - 17.0 g/dL 12.4(L) 13.9  Hematocrit 39.0 - 52.0 % 37.9(L) 42.1  Platelets 150 - 400 K/uL 156 200     CMP Latest Ref Rng & Units 05/11/2019 05/10/2019 05/09/2019  Glucose 70 - 99 mg/dL 05/11/2019) 706(C) 376(E)  BUN 6 - 20 mg/dL 11 13 16   Creatinine 0.61 - 1.24 mg/dL 831(D ) 1.76)  Sodium 135 - 145 mmol/L 135 135 135  Potassium 3.5 - 5.1 mmol/L 3.8 3.6 4.1  Chloride 98 - 111 mmol/L 105 109 110  CO2 22 - 32 mmol/L 21(L) 21(L) 20(L)  Calcium 8.9 - 10.3 mg/dL 1.60(V) 8.3(L) 7.4(L)  Total Protein 6.5 - 8.1 g/dL 7.2 7.1 3.71(G)  Total Bilirubin 0.3 - 1.2 mg/dL 6.2(I) 9.4(W) 1.0  Alkaline Phos 38 - 126 U/L 67 65 60  AST 15 - 41 U/L 61(H) 58(H) 43(H)  ALT 0 - 44 U/L 61(H) 60(H) 47(H)  Micro 05/09/2019 blood cultures staph aureus 4 out of 4 05/12/2019 blood cultures so far negative   Impression/recommendation  MSSA bacteremia 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  He has a prior history of stroke from a previous endocarditis last year. Has a pacemaker that will have to be removed sometime during the course.  Otherwise there is a high risk of the lead wires getting infected if not already infected.  History of mitral valve repair last in 2020 History of strep pneumo endocarditis Need medical records from [facility].  Told the secretary to obtain records.  History of symptomatic bradycardia needing pacemaker. Possibly due to endocarditis.  History of CVA while  having surgery nor in 2020.  Patient has depression and anxiety.  Will benefit from a psychiatry consultation which he was interested in getting.  HIV: He says he was on a Atripla but has not been taking it for a few weeks.  His last viral load is 10K and CD4 is pending.  Will need the records from [facility].  Did speak to his HIV providers PA who said that he was on Symtuza.  But patient said he only got samples for 30 days. Records will help to clarify his genotype and then plan for HAART Discussed the management with hospitalist and the cardiologist

## 2019-05-13 NOTE — Progress Notes (Signed)
PROGRESS NOTE    James Ferrell   VAP:[id number]  DOB: December 23, 1975  PCP: Patient, No Pcp Per    DOA: 05/09/2019 LOS: 4   Brief Narrative   Cody Albus is an 44 y.o. malewith medical history significant for HIV infection, chronic low back pain, stroke (occuredduring heart valve surgery) and heart valve surgery (does not know which valve), status post pacemaker, who presented to the hospital with fever and chills, progressive generalized weakness and dizziness, with a near fall. He went to [facility] on the day of admission where his temperature was 103 F in the emergency department. Due to long waiting times in the emergency department at [facility], he came to [facility] for further evaluation.  In the ED here, tachycardic and hypotensive, given boluses of IV fluids.  He was initially diagnosed with sepsis of unknown etiology, treated empirically with IV cefepime and vancomycin.  Blood culture grew MSSA and antibiotics were deescalated to IV cefazolin.  ID was consulted.  2D echo did not show any evidence of endocarditis so cardiology was consulted for TEE.   Assessment & Plan   Principal Problem:   Sepsis (HCC) Active Problems:   Leukocytosis   Hypotension   AKI (acute kidney injury) (HCC)   Bacteremia due to Staphylococcus aureus   MSSA sepsis and bacteremia/leukocytosis:  2D echo negative for vegetations.  TEE done 4/21, report pending --ID following, pacemaker likely source will need removed --Continue IV Ancef --cardiology consult for TEE --follow repeat cultures  Acute kidney injury, hypotension and hyponatremia: Resolved.   Mildly elevated liver enzymes: Hepatitis panel is negative  History of stroke: previously on aspirin and Lipitor but stopped taking them months ago (October or November 2020).   --resumed on ASA, Lipitor   HIV infection: HIV RNA is 10,500.  Patient said he takes Atripla at home. --to follow up with ID  outpatient to resume HAART  Permanent pacemaker status, history of heart valve surgery.  Order has been placed to obtain records from [facility] in [place].   Previous home medications: His significant other brought some medications that were filled in June 2020.  She said patient used to take them but he stopped taking them months ago.  These were low-dose aspirin, metoprolol, Lipitor, naproxen and cyclobenzaprine (chronic low back pain).    Patient BMI: Body mass index is 25.8 kg/m.   DVT prophylaxis: heparin Diet:  Diet Orders (From admission, onward)    Start     Ordered   05/13/19 0945  Diet regular Room service appropriate? Yes; Fluid consistency: Thin  Diet effective now    Question Answer Comment  Room service appropriate? Yes   Fluid consistency: Thin      05/13/19 0944            Code Status: Full Code    Subjective 05/13/19    Patient seen this AM after TEE.  Reports procedure went fine.  States he feels really well, much better.  No fevers, chills or other complaints today.  Disposition Plan & Communication   Dispo & Barriers: expect d/c home pending clearance by consultants and appropriate antibiotic regimen is determined.  Patient continues to require inpatient care for IV antibiotics while bacteremia evaluation ongoing Coming from: home Exp d/c date: 2-3 day est. Medically stable for d/c? no  Family Communication: none at bedside, will attempt to call    Consults, Procedures, Significant Events   Consultants:   Infectious Disease  Cardiology  Procedures:  2D Echo  TEE  Antimicrobials:   Ancef 4/18 >>    Objective   Vitals:   05/13/19 0830 05/13/19 0845 05/13/19 0900 05/13/19 0934  BP: 107/73 101/67 104/73 111/75  Pulse: 97 88 88 85  Resp: 14 (!) 24 18 16   Temp:    98.3 F (36.8 C)  TempSrc:      SpO2: 94% 96% 96% 99%  Weight:      Height:        Intake/Output Summary (Last 24 hours) at 05/13/2019 1210 Last data  filed at 05/13/2019 0322 Gross per 24 hour  Intake 556.51 ml  Output 0 ml  Net 556.51 ml   Filed Weights   05/09/19 1305 05/13/19 0740  Weight: 83.9 kg 83.9 kg    Physical Exam:  General exam: awake, alert, no acute distress HEENT: atraumatic, clear conjunctiva, anicteric sclera, moist mucus membranes, hearing grossly normal  Respiratory system: CTAB, no wheezes, rales or rhonchi, normal respiratory effort. Cardiovascular system: normal Z6/X0, RRR, 2/6 systolic murmur, no pedal edema.   Gastrointestinal system: soft, NT, ND, no HSM felt, +bowel sounds. Central nervous system: A&O x4. no gross focal neurologic deficits, normal speech Extremities: moves all, no edema, normal tone Skin: dry, intact, normal temperature, normal color Psychiatry: normal mood, congruent affect, judgement and insight appear normal  Labs   Data Reviewed: I have personally reviewed following labs and imaging studies  CBC: Recent Labs  Lab 05/09/19 1309 05/10/19 0533  WBC 15.8* 9.9  HGB 13.9 12.4*  HCT 42.1 37.9*  MCV 88.4 90.2  PLT 200 960   Basic Metabolic Panel: Recent Labs  Lab 05/09/19 1309 05/09/19 1600 05/10/19 0533 05/11/19 0409  NA 131* 135 135 135  K 4.3 4.1 3.6 3.8  CL 103 110 109 105  CO2 18* 20* 21* 21*  GLUCOSE 121* 114* 116* 103*  BUN 18 16 13 11   CREATININE 1.86* 1.69* 1.38* 1.22  CALCIUM 9.0 7.4* 8.3* 8.7*   GFR: Estimated Creatinine Clearance: 84.9 mL/min (by C-G formula based on SCr of 1.22 mg/dL). Liver Function Tests: Recent Labs  Lab 05/09/19 1600 05/10/19 0533 05/11/19 0409  AST 43* 58* 61*  ALT 47* 60* 61*  ALKPHOS 60 65 67  BILITOT 1.0 1.6* 1.6*  PROT 6.4* 7.1 7.2  ALBUMIN 3.0* 3.2* 3.0*   No results for input(s): LIPASE, AMYLASE in the last 168 hours. No results for input(s): AMMONIA in the last 168 hours. Coagulation Profile: No results for input(s): INR, PROTIME in the last 168 hours. Cardiac Enzymes: No results for input(s): CKTOTAL, CKMB,  CKMBINDEX, TROPONINI in the last 168 hours. BNP (last 3 results) No results for input(s): PROBNP in the last 8760 hours. HbA1C: No results for input(s): HGBA1C in the last 72 hours. CBG: No results for input(s): GLUCAP in the last 168 hours. Lipid Profile: Recent Labs    05/12/19 0038  CHOL 166  HDL 13*  LDLCALC UNABLE TO CALCULATE IF TRIGLYCERIDE OVER 400 mg/dL  TRIG 767*  CHOLHDL 12.8  LDLDIRECT 35.7   Thyroid Function Tests: No results for input(s): TSH, T4TOTAL, FREET4, T3FREE, THYROIDAB in the last 72 hours. Anemia Panel: No results for input(s): VITAMINB12, FOLATE, FERRITIN, TIBC, IRON, RETICCTPCT in the last 72 hours. Sepsis Labs: Recent Labs  Lab 05/09/19 1332 05/09/19 1600 05/10/19 0533 05/11/19 0409  PROCALCITON  --  17.78 21.77 13.97  LATICACIDVEN 1.3  --   --   --     Recent Results (from the past 240 hour(s))  Culture,  blood (routine x 2)     Status: Abnormal   Collection Time: 05/09/19  1:10 PM   Specimen: BLOOD  Result Value Ref Range Status   Specimen Description   Final    BLOOD RFA Performed at [facility], [address] [place], [place] [address]    Special Requests   Final    BOTTLES DRAWN AEROBIC AND ANAEROBIC Blood Culture results may not be optimal due to an excessive volume of blood received in culture bottles Performed at [facility], [address] [place], [place] [address]    Culture  Setup Time   Final    IN BOTH AEROBIC AND ANAEROBIC BOTTLES GRAM POSITIVE COCCI CRITICAL RESULT CALLED TO, READ BACK BY AND VERIFIED WITH: SCOTT HALL ON 05/10/19 AT 0052 [facility] Performed at [facility], [address] [place], [place] [address]    Culture STAPHYLOCOCCUS AUREUS (A)  Final   Report Status 05/12/2019 FINAL  Final   Organism ID, Bacteria STAPHYLOCOCCUS AUREUS  Final      Susceptibility   Staphylococcus aureus - MIC*    CIPROFLOXACIN >=8 RESISTANT Resistant     ERYTHROMYCIN >=8 RESISTANT Resistant      GENTAMICIN <=0.5 SENSITIVE Sensitive     OXACILLIN <=0.25 SENSITIVE Sensitive     TETRACYCLINE <=1 SENSITIVE Sensitive     VANCOMYCIN 1 SENSITIVE Sensitive     TRIMETH/SULFA <=10 SENSITIVE Sensitive     CLINDAMYCIN <=0.25 SENSITIVE Sensitive     RIFAMPIN <=0.5 SENSITIVE Sensitive     Inducible Clindamycin NEGATIVE Sensitive     * STAPHYLOCOCCUS AUREUS  Blood Culture ID Panel (Reflexed)     Status: Abnormal   Collection Time: 05/09/19  1:10 PM  Result Value Ref Range Status   Enterococcus species NOT DETECTED NOT DETECTED Final   Listeria monocytogenes NOT DETECTED NOT DETECTED Final   Staphylococcus species DETECTED (A) NOT DETECTED Final    Comment: CRITICAL RESULT CALLED TO, READ BACK BY AND VERIFIED WITH: SCOTT HALL ON 05/10/19 AT 0052 [facility]    Staphylococcus aureus (BCID) DETECTED (A) NOT DETECTED Final    Comment: Methicillin (oxacillin) susceptible Staphylococcus aureus (MSSA). Preferred therapy is anti staphylococcal beta lactam antibiotic (Cefazolin or [name]), unless clinically contraindicated. CRITICAL RESULT CALLED TO, READ BACK BY AND VERIFIED WITH: SCOTT HALL ON 05/10/19 AT 0052 [facility]    Methicillin resistance NOT DETECTED NOT DETECTED Final   Streptococcus species NOT DETECTED NOT DETECTED Final   Streptococcus agalactiae NOT DETECTED NOT DETECTED Final   Streptococcus pneumoniae NOT DETECTED NOT DETECTED Final   Streptococcus pyogenes NOT DETECTED NOT DETECTED Final   Acinetobacter baumannii NOT DETECTED NOT DETECTED Final   Enterobacteriaceae species NOT DETECTED NOT DETECTED Final   Enterobacter cloacae complex NOT DETECTED NOT DETECTED Final   Escherichia coli NOT DETECTED NOT DETECTED Final   Klebsiella oxytoca NOT DETECTED NOT DETECTED Final   Klebsiella pneumoniae NOT DETECTED NOT DETECTED Final   Proteus species NOT DETECTED NOT DETECTED Final   Serratia marcescens NOT DETECTED NOT DETECTED Final   Haemophilus influenzae NOT DETECTED NOT DETECTED Final    Neisseria meningitidis NOT DETECTED NOT DETECTED Final   Pseudomonas aeruginosa NOT DETECTED NOT DETECTED Final   Candida albicans NOT DETECTED NOT DETECTED Final   Candida glabrata NOT DETECTED NOT DETECTED Final   Candida krusei NOT DETECTED NOT DETECTED Final   Candida parapsilosis NOT DETECTED NOT DETECTED Final   Candida tropicalis NOT DETECTED NOT DETECTED Final    Comment: Performed at [facility], [address] [place], [place] [address]  Culture, blood (routine x 2)     Status: Abnormal   Collection Time: 05/09/19  1:52 PM   Specimen: BLOOD  Result Value Ref Range Status   Specimen Description   Final    BLOOD BLOOD LEFT FOREARM Performed at [facility], [address], [place], [place] [address]    Special Requests   Final    BOTTLES DRAWN AEROBIC AND ANAEROBIC Blood Culture adequate volume Performed at [facility], [address] [place], [place] [address]    Culture  Setup Time   Final    IN BOTH AEROBIC AND ANAEROBIC BOTTLES GRAM POSITIVE COCCI CRITICAL VALUE NOTED.  VALUE IS CONSISTENT WITH PREVIOUSLY REPORTED AND CALLED VALUE. Performed at [facility], [address] [place], [place] [address]    Culture (A)  Final    STAPHYLOCOCCUS AUREUS SUSCEPTIBILITIES PERFORMED ON PREVIOUS CULTURE WITHIN THE LAST 5 DAYS. Performed at [facility], [address], [place], [place] [address]    Report Status 05/12/2019 FINAL  Final  Respiratory Panel by RT PCR (Flu A&B, Covid) - Nasopharyngeal Swab     Status: None   Collection Time: 05/09/19  3:11 PM   Specimen: Nasopharyngeal Swab  Result Value Ref Range Status   SARS Coronavirus 2 by RT PCR NEGATIVE NEGATIVE Final    Comment: (NOTE) SARS-CoV-2 target nucleic acids are NOT DETECTED. The SARS-CoV-2 RNA is generally detectable in upper respiratoy specimens during the acute phase of infection. The lowest concentration of SARS-CoV-2 viral copies this assay can detect  is 131 copies/mL. A negative result does not preclude SARS-Cov-2 infection and should not be used as the sole basis for treatment or other patient management decisions. A negative result may occur with  improper specimen collection/handling, submission of specimen other than nasopharyngeal swab, presence of viral mutation(s) within the areas targeted by this assay, and inadequate number of viral copies (<131 copies/mL). A negative result must be combined with clinical observations, patient history, and epidemiological information. The expected result is Negative. Fact Sheet for Patients:  https://www.moore.com/ Fact Sheet for Healthcare Providers:  https://www.young.biz/ This test is not yet ap proved or cleared by the [place] FDA and  has been authorized for detection and/or diagnosis of SARS-CoV-2 by FDA under an Emergency Use Authorization (EUA). This EUA will remain  in effect (meaning this test can be used) for the duration of the COVID-19 declaration under Section 564(b)(1) of the Act, 21 U.S.C. section 360bbb-3(b)(1), unless the authorization is terminated or revoked sooner.    Influenza A by PCR NEGATIVE NEGATIVE Final   Influenza B by PCR NEGATIVE NEGATIVE Final    Comment: (NOTE) The Xpert Xpress SARS-CoV-2/FLU/RSV assay is intended as an aid in  the diagnosis of influenza from Nasopharyngeal swab specimens and  should not be used as a sole basis for treatment. Nasal washings and  aspirates are unacceptable for Xpert Xpress SARS-CoV-2/FLU/RSV  testing. Fact Sheet for Patients: https://www.moore.com/ Fact Sheet for Healthcare Providers: https://www.young.biz/ This test is not yet approved or cleared by the [place] FDA and  has been authorized for detection and/or diagnosis of SARS-CoV-2 by  FDA under an Emergency Use Authorization (EUA). This EUA will remain  in effect (meaning this  test can be used) for the duration of the  Covid-19 declaration under Section 564(b)(1) of the Act, 21  U.S.C. section 360bbb-3(b)(1), unless the authorization is  terminated or revoked. Performed at [facility], [address] [place], [place] [address]   CULTURE, BLOOD (ROUTINE X 2) w Reflex to ID Panel     Status: None (Preliminary result)   Collection Time: 05/12/19 12:38 AM   Specimen: BLOOD  Result Value Ref Range Status   Specimen Description BLOOD RAC  Final   Special Requests BOTTLES DRAWN AEROBIC AND ANAEROBIC BCAV  Final   Culture   Final    NO GROWTH 1 DAY Performed at [facility], [address], [place], [place] [address]    Report Status PENDING  Incomplete  CULTURE, BLOOD (ROUTINE X 2) w Reflex to ID Panel     Status: None (Preliminary result)   Collection Time: 05/12/19 12:46 AM   Specimen: BLOOD  Result Value Ref Range Status   Specimen Description BLOOD LAC  Final   Special Requests BOTTLES DRAWN AEROBIC AND ANAEROBIC BCAV  Final   Culture   Final    NO GROWTH 1 DAY Performed at [facility], [address] [place], [place] [address]    Report Status PENDING  Incomplete      Imaging Studies   ECHOCARDIOGRAM COMPLETE  Result Date: 05/11/2019    ECHOCARDIOGRAM REPORT   Patient Name:   LOYS SHUGARS Date of Exam: 05/11/2019 Medical Rec #:  [id number]        Height:       71.0 in Accession #:    [id number]       Weight:       185.0 lb Date of Birth:  09-Feb-1975        BSA:          2.040 m Patient Age:    43 years         BP:           108/70 mmHg Patient Gender: M                HR:           105 bpm. Exam Location:  [facility] Procedure: 2D Echo, Color Doppler and Cardiac Doppler Indications:     Bacteremia 790.7  History:         Patient has no prior history of Echocardiogram examinations.                  Stroke.  Sonographer:     Cristela Blue RDCS (AE) Referring Phys:  [id number] Lurene Shadow Diagnosing Phys: Lorine Bears MD  IMPRESSIONS  1. Left ventricular ejection fraction, by estimation, is 50 to 55%. The left ventricle has low normal function. The left ventricle has no regional wall motion abnormalities. The left ventricular internal cavity size was mildly dilated. Left ventricular diastolic parameters are indeterminate.  2. Right ventricular systolic function is normal. The right ventricular size is normal. There is normal pulmonary artery systolic pressure.  3. Right atrial size was mildly dilated.  4. The mitral valve is normal in structure. Trivial mitral valve regurgitation. No evidence of mitral stenosis.  5. The aortic valve is abnormal. Aortic valve regurgitation is trivial. No aortic stenosis is present. Aortic valve is not well visualized but suspect there is a bioprosthesis.  6. 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  7. Rcommend a TEE if there is a concern for endocarditis. FINDINGS  Left Ventricle: Left ventricular ejection fraction, by estimation, is 50 to 55%. The left ventricle has low normal function. The left ventricle has no regional wall motion abnormalities.  The left ventricular internal cavity size was mildly dilated. There is no left ventricular hypertrophy. Left ventricular diastolic parameters are indeterminate. Right Ventricle: The right ventricular size is normal. No increase in right ventricular wall thickness. Right ventricular systolic function is normal. There is normal pulmonary artery systolic pressure. The tricuspid regurgitant velocity is 1.81 m/s, and  with an assumed right atrial pressure of 10 mmHg, the estimated right ventricular systolic pressure is 23.1 mmHg. Left Atrium: Left atrial size was normal in size. Right Atrium: Right atrial size was mildly dilated. Pericardium: There is no evidence of pericardial effusion. Mitral Valve: The mitral valve is normal in structure. Normal mobility of the mitral valve leaflets. Trivial mitral valve  regurgitation. No evidence of mitral valve stenosis. Tricuspid Valve: The tricuspid valve is normal in structure. Tricuspid valve regurgitation is trivial. No evidence of tricuspid stenosis. Aortic Valve: The aortic valve is abnormal. . There is moderate thickening of the aortic valve. Aortic valve regurgitation is trivial. No aortic stenosis is present. There is moderate thickening of the aortic valve. Aortic valve mean gradient measures 5.7 mmHg. Aortic valve peak gradient measures 9.5 mmHg. Aortic valve area, by VTI measures 1.93 cm. Pulmonic Valve: The pulmonic valve was normal in structure. Pulmonic valve regurgitation is not visualized. No evidence of pulmonic stenosis. Aorta: The aortic root is normal in size and structure. 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 Venous: The inferior vena cava was not well visualized. IAS/Shunts: No atrial level shunt detected by color flow Doppler.  LEFT VENTRICLE PLAX 2D LVIDd:         5.73 cm  Diastology LVIDs:         3.71 cm  LV e' lateral:   16.30 cm/s LV PW:         1.40 cm  LV E/e' lateral: 6.1 LV IVS:        0.85 cm  LV e' medial:    9.68 cm/s LVOT diam:     2.00 cm  LV E/e' medial:  10.3 LV SV:         52 LV SV Index:   25 LVOT Area:     3.14 cm  RIGHT VENTRICLE RV Basal diam:  4.24 cm RV S prime:     7.18 cm/s TAPSE (M-mode): 3.6 cm LEFT ATRIUM             Index       RIGHT ATRIUM           Index LA diam:        3.90 cm 1.91 cm/m  RA Area:     22.40 cm LA Vol (A2C):   78.7 ml 38.58 ml/m RA Volume:   74.60 ml  36.57 ml/m LA Vol (A4C):   37.8 ml 18.53 ml/m LA Biplane Vol: 57.0 ml 27.94 ml/m  AORTIC VALVE                    PULMONIC VALVE AV Area (Vmax):    1.66 cm     PV Vmax:        0.79 m/s AV Area (Vmean):   1.65 cm     PV Peak grad:   2.5 mmHg AV Area (VTI):     1.93 cm     RVOT Peak grad: 3 mmHg AV Vmax:           154.00 cm/s AV Vmean:  106.667 cm/s AV VTI:            0.267 m AV Peak Grad:      9.5  mmHg AV Mean Grad:      5.7 mmHg LVOT Vmax:         81.30 cm/s LVOT Vmean:        56.000 cm/s LVOT VTI:          0.164 m LVOT/AV VTI ratio: 0.61  AORTA Ao Root diam: 4.75 cm MITRAL VALVE               TRICUSPID VALVE MV Area (PHT): 4.10 cm    TR Peak grad:   13.1 mmHg MV Decel Time: 185 msec    TR Vmax:        181.00 cm/s MV E velocity: 99.90 cm/s MV A velocity: 65.00 cm/s  SHUNTS MV E/A ratio:  1.54        Systemic VTI:  0.16 m                            Systemic Diam: 2.00 cm Lorine Bears MD Electronically signed by Lorine Bears MD Signature Date/Time: 05/11/2019/2:49:53 PM    Final    ECHO TEE  Result Date: 05/13/2019    TRANSESOPHOGEAL ECHO REPORT   Patient Name:   ZUHAYR DEENEY Date of Exam: 05/13/2019 Medical Rec #:  [id number]        Height:       71.0 in Accession #:    [id number]       Weight:       185.0 lb Date of Birth:  Mar 11, 1975        BSA:          2.040 m Patient Age:    43 years         BP:           97/69 mmHg Patient Gender: M                HR:           95 bpm. Exam Location:  [facility] Procedure: Transesophageal Echo, Cardiac Doppler and Color Doppler Indications:     Not listed  History:         Patient has prior history of Echocardiogram examinations, most                  recent 05/11/2019. Stroke.  Sonographer:     Cristela Blue RDCS (AE) Referring Phys:  Lajuana Carry A KHAN Diagnosing Phys: Adrian Blackwater MD PROCEDURE: The transesophogeal probe was passed without difficulty through the esophogus of the patient. Sedation performed by performing physician. The patient's vital signs; including heart rate, blood pressure, and oxygen saturation; remained stable throughout the procedure. The patient developed no complications during the procedure. IMPRESSIONS  1. Left ventricular ejection fraction, by estimation, is 60 to 65%. The left ventricle has normal function. The left ventricle has no regional wall motion abnormalities.  2. Right ventricular systolic function is normal. The right  ventricular size is normal.  3. No left atrial/left atrial appendage thrombus was detected.  4. The mitral valve is normal in structure. No evidence of mitral valve regurgitation. No evidence of mitral stenosis.  5. 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 The aortic valve is tricuspid. Aortic valve regurgitation is mild to moderate. No aortic stenosis is present.  6. The inferior vena cava is normal in size  with greater than 50% respiratory variability, suggesting right atrial pressure of 3 mmHg. Conclusion(s)/Recommendation(s): Findings are concerning for vegetation/infective endocarditis as detailed above. 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 FINDINGS  Left Ventricle: Left ventricular ejection fraction, by estimation, is 60 to 65%. The left ventricle has normal function. The left ventricle has no regional wall motion abnormalities. The left ventricular internal cavity size was normal in size. There is  no left ventricular hypertrophy. Right Ventricle: The right ventricular size is normal. No increase in right ventricular wall thickness. Right ventricular systolic function is normal. Left Atrium: Left atrial size was normal in size. No left atrial/left atrial appendage thrombus was detected. Right Atrium: Right atrial size was normal in size. Pericardium: There is no evidence of pericardial effusion. Mitral Valve: The mitral valve is normal in structure. Normal mobility of the mitral valve leaflets. No evidence of mitral valve regurgitation. No evidence of mitral valve stenosis. Tricuspid Valve: The tricuspid valve is normal in structure. Tricuspid valve regurgitation is not demonstrated. No evidence of tricuspid stenosis. 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 The aortic valve is tricuspid. Aortic valve regurgitation is mild to moderate. No aortic stenosis is present. Pulmonic Valve: The pulmonic valve was normal in structure. Pulmonic valve regurgitation is not  visualized. No evidence of pulmonic stenosis. Aorta: The aortic root is normal in size and structure. Venous: The inferior vena cava is normal in size with greater than 50% respiratory variability, suggesting right atrial pressure of 3 mmHg. IAS/Shunts: No atrial level shunt detected by color flow Doppler. Adrian Blackwater MD Electronically signed by Adrian Blackwater MD Signature Date/Time: 05/13/2019/9:02:59 AM    Final      Medications   Scheduled Meds: . aspirin EC  81 mg Oral Daily  . atorvastatin  40 mg Oral QHS  . butamben-tetracaine-benzocaine      . fentaNYL      . heparin  5,000 Units Subcutaneous Q8H  . lidocaine      . midazolam      . sodium chloride flush       Continuous Infusions: .  ceFAZolin (ANCEF) IV 2 g (05/13/19 1058)       LOS: 4 days    Time spent: 35 minutes    Pennie Banter, DO [facility]   If 7PM-7AM, please contact night-coverage [email or web address] 05/13/2019, 12:10 PM

## 2019-05-14 LAB — T-HELPER CELLS CD4/CD8 %
% CD 4 Pos. Lymph.: 25.1 % — ABNORMAL LOW (ref 30.8–58.5)
Absolute CD 4 Helper: 326 /uL — ABNORMAL LOW (ref 359–1519)
Basophils Absolute: 0 10*3/uL (ref 0.0–0.2)
Basos: 1 %
CD3+CD4+ Cells/CD3+CD8+ Cells Bld: 0.54 — ABNORMAL LOW (ref 0.92–3.72)
CD3+CD8+ Cells # Bld: 608 /uL (ref 109–897)
CD3+CD8+ Cells NFr Bld: 46.8 % — ABNORMAL HIGH (ref 12.0–35.5)
EOS (ABSOLUTE): 0.1 10*3/uL (ref 0.0–0.4)
Eos: 1 %
Hematocrit: 39.3 % (ref 37.5–51.0)
Hemoglobin: 12.6 g/dL — ABNORMAL LOW (ref 13.0–17.7)
Immature Grans (Abs): 0.1 10*3/uL (ref 0.0–0.1)
Immature Granulocytes: 2 %
Lymphocytes Absolute: 1.3 10*3/uL (ref 0.7–3.1)
Lymphs: 21 %
MCH: 28.6 pg (ref 26.6–33.0)
MCHC: 32.1 g/dL (ref 31.5–35.7)
MCV: 89 fL (ref 79–97)
Monocytes Absolute: 0.7 10*3/uL (ref 0.1–0.9)
Monocytes: 12 %
Neutrophils Absolute: 3.7 10*3/uL (ref 1.4–7.0)
Neutrophils: 63 %
Platelets: 235 10*3/uL (ref 150–450)
RBC: 4.41 x10E6/uL (ref 4.14–5.80)
RDW: 11.8 % (ref 11.6–15.4)
WBC: 5.9 10*3/uL (ref 3.4–10.8)

## 2019-05-14 LAB — COMPREHENSIVE METABOLIC PANEL
ALT: 94 U/L — ABNORMAL HIGH (ref 0–44)
AST: 73 U/L — ABNORMAL HIGH (ref 15–41)
Albumin: 3.4 g/dL — ABNORMAL LOW (ref 3.5–5.0)
Alkaline Phosphatase: 128 U/L — ABNORMAL HIGH (ref 38–126)
Anion gap: 8 (ref 5–15)
BUN: 12 mg/dL (ref 6–20)
CO2: 22 mmol/L (ref 22–32)
Calcium: 8.9 mg/dL (ref 8.9–10.3)
Chloride: 105 mmol/L (ref 98–111)
Creatinine, Ser: 1.13 mg/dL (ref 0.61–1.24)
GFR calc Af Amer: >60 mL/min (ref >60)
GFR calc non Af Amer: >60 mL/min (ref >60)
Glucose, Bld: 107 mg/dL — ABNORMAL HIGH (ref 70–99)
Potassium: 4 mmol/L (ref 3.5–5.1)
Sodium: 135 mmol/L (ref 135–145)
Total Bilirubin: 0.6 mg/dL (ref 0.3–1.2)
Total Protein: 8.2 g/dL — ABNORMAL HIGH (ref 6.5–8.1)

## 2019-05-14 NOTE — Progress Notes (Signed)
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

## 2019-05-14 NOTE — Progress Notes (Signed)
PROGRESS NOTE    James Ferrell   IRW:[id number]  DOB: November 29, 1975  PCP: Patient, No Pcp Per    DOA: 05/09/2019 LOS: 5   Brief Narrative   James Ferrell is an 44 y.o. malewith medical history significant for HIV infection, chronic low back pain, stroke (occuredduring heart valve surgery) and heart valve surgery (does not know which valve), status post pacemaker, who presented to the hospital with fever and chills, progressive generalized weakness and dizziness, with a near fall. He went to [facility] on the day of admission where his temperature was 103 F in the emergency department. Due to long waiting times in the emergency department at [facility], he came to [facility] for further evaluation.  In the ED here, tachycardic and hypotensive, given boluses of IV fluids.  He was initially diagnosed with sepsis of unknown etiology, treated empirically with IV cefepime and vancomycin.  Blood culture grew MSSA and antibiotics were deescalated to IV cefazolin.  ID was consulted.  2D echo did not show any evidence of endocarditis so cardiology was consulted for TEE.   Assessment & Plan   Principal Problem:   Sepsis (HCC) Active Problems:   Leukocytosis   Hypotension   AKI (acute kidney injury) (HCC)   Bacteremia due to Staphylococcus aureus   MSSA sepsis and bacteremia/leukocytosis:  2D echo negative for vegetations.  TEE done 4/21 shows 2x2cm aortic valve vegetation. --ID following, pacemaker likely source will need removed --CT surgery consulted, patient's prior records from [facility] in [place] have been requested for surgical planning, not yet received --Continue IV Ancef --follow repeat cultures  Acute kidney injury, hypotension and hyponatremia: Resolved.   Mildly elevated liver enzymes: Hepatitis panel is negative  History of stroke: previously on aspirin and Lipitor but stopped taking them months ago (October or November 2020).     --resumed on ASA, Lipitor   HIV infection: HIV RNA is 10,500.  Patient said he takes Atripla at home. 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  Permanent pacemaker status, history of heart valve surgery.  Order has been placed to obtain records from [facility] in [place].   Previous home medications: His significant other brought some medications that were filled in June 2020.  She said patient used to take them but he stopped taking them months ago.  These were low-dose aspirin, metoprolol, Lipitor, naproxen and cyclobenzaprine (chronic low back pain).     Patient BMI: Body mass index is 25.8 kg/m.   DVT prophylaxis: heparin Diet:  Diet Orders (From admission, onward)    Start     Ordered   05/13/19 0945  Diet regular Room service appropriate? Yes; Fluid consistency: Thin  Diet effective now    Question Answer Comment  Room service appropriate? Yes   Fluid consistency: Thin      05/13/19 0944            Code Status: Full Code    Subjective 05/14/19    Patient seen this AM at bedside.  No acute events reported.  He reports feeling okay.  He mentions feel possible anal warts that are sometimes uncomfortable.  He describes the events and stressors in his life over past year, tearful while discussing.  No fevers, chills or other complaints.  Disposition Plan & Communication   Dispo & Barriers: awaiting records from [facility] for CT surgery to review.  Patient will need CT surgery, prior records  needed for planning.  Patient continues to require inpatient care for IV antibiotics for bacteremia with TEE-confirmed endocarditis. Coming from: home Exp d/c date: TBD Medically stable for d/c? no  Family Communication: none at bedside, will attempt to call    Consults, Procedures, Significant Events   Consultants:   Infectious Disease  Cardiology  Procedures:   2D Echo  TEE  Antimicrobials:    Ancef 4/18 >>    Objective   Vitals:   05/13/19 0934 05/13/19 1515 05/13/19 2020 05/14/19 0501  BP: 111/75 102/63 121/75 (!) 98/53  Pulse: 85 97 85 99  Resp: Temp: 98.3 F (36.8 C) 98.6 F (37 C) 97.8 F (36.6 C) 98.8 F (37.1 C)  TempSrc:  Oral Oral Oral  SpO2: 99% 98% 99% 98%  Weight:      Height:        Intake/Output Summary (Last 24 hours) at 05/14/2019 0719 Last data filed at 05/14/2019 1610 Gross per 24 hour  Intake 200 ml  Output 900 ml  Net -700 ml   Filed Weights   05/09/19 1305 05/13/19 0740  Weight: 83.9 kg 83.9 kg    Physical Exam:  General exam: awake, alert, no acute distress Respiratory system: CTAB, no wheezes, rales or rhonchi, normal respiratory effort. Cardiovascular system: normal S1/S2, RRR, 2/6 systolic murmur, no pedal edema.   Gastrointestinal system: soft, NT, ND, few warts seen perianal and single at the anal verge. Central nervous system: A&O x4. no gross focal neurologic deficits, normal speech Extremities: moves all, no edema, normal tone Psychiatry: depressed mood, congruent affect, judgement and insight appear normal  Labs   Data Reviewed: I have personally reviewed following labs and imaging studies  CBC: Recent Labs  Lab 05/09/19 1309 05/10/19 0533  WBC 15.8* 9.9  HGB 13.9 12.4*  HCT 42.1 37.9*  MCV 88.4 90.2  PLT 200 156   Basic Metabolic Panel: Recent Labs  Lab 05/09/19 1309 05/09/19 1600 05/10/19 0533 05/11/19 0409 05/14/19 0525  NA 131* 135 135 135 135  K 4.3 4.1 3.6 3.8 4.0  CL 103 110 109 105 105  CO2 18* 20* 21* 21* 22  GLUCOSE 121* 114* 116* 103* 107*  BUN CREATININE 1.86* 1.69* 1.38* 1.22 1.13  CALCIUM 9.0 7.4* 8.3* 8.7* 8.9   GFR: Estimated Creatinine Clearance: 91.6 mL/min (by C-G formula based on SCr of 1.13 mg/dL). Liver Function Tests: Recent Labs  Lab 05/09/19 1600 05/10/19 0533 05/11/19 0409 05/14/19 0525  AST 43* 58* 61* 73*  ALT 47* 60* 61* 94*   ALKPHOS 60 65 67 128*  BILITOT 1.0 1.6* 1.6* 0.6  PROT 6.4* 7.1 7.2 8.2*  ALBUMIN 3.0* 3.2* 3.0* 3.4*   No results for input(s): LIPASE, AMYLASE in the last 168 hours. No results for input(s): AMMONIA in the last 168 hours. Coagulation Profile: No results for input(s): INR, PROTIME in the last 168 hours. Cardiac Enzymes: No results for input(s): CKTOTAL, CKMB, CKMBINDEX, TROPONINI in the last 168 hours. BNP (last 3 results) No results for input(s): PROBNP in the last 8760 hours. HbA1C: No results for input(s): HGBA1C in the last 72 hours. CBG: No results for input(s): GLUCAP in the last 168 hours. Lipid Profile: Recent Labs    05/12/19 0038  CHOL 166  HDL 13*  LDLCALC UNABLE TO CALCULATE IF TRIGLYCERIDE OVER 400 mg/dL  TRIG 960*  CHOLHDL 45.4  LDLDIRECT 35.7   Thyroid Function Tests: No results for input(s): TSH,  T4TOTAL, FREET4, T3FREE, THYROIDAB in the last 72 hours. Anemia Panel: No results for input(s): VITAMINB12, FOLATE, FERRITIN, TIBC, IRON, RETICCTPCT in the last 72 hours. Sepsis Labs: Recent Labs  Lab 05/09/19 1332 05/09/19 1600 05/10/19 0533 05/11/19 0409  PROCALCITON  --  17.78 21.77 13.97  LATICACIDVEN 1.3  --   --   --     Recent Results (from the past 240 hour(s))  Culture, blood (routine x 2)     Status: Abnormal   Collection Time: 05/09/19  1:10 PM   Specimen: BLOOD  Result Value Ref Range Status   Specimen Description   Final    BLOOD RFA Performed at [facility], [address], [place], [place] [address]    Special Requests   Final    BOTTLES DRAWN AEROBIC AND ANAEROBIC Blood Culture results may not be optimal due to an excessive volume of blood received in culture bottles Performed at [facility], [address] [place], [place] [address]    Culture  Setup Time   Final    IN BOTH AEROBIC AND ANAEROBIC BOTTLES GRAM POSITIVE COCCI CRITICAL RESULT CALLED TO, READ BACK BY AND VERIFIED WITH: SCOTT HALL ON 05/10/19 AT  0052 [facility] Performed at [facility], [place] [address] [place], [place] [address]    Culture STAPHYLOCOCCUS AUREUS (A)  Final   Report Status 05/12/2019 FINAL  Final   Organism ID, Bacteria STAPHYLOCOCCUS AUREUS  Final      Susceptibility   Staphylococcus aureus - MIC*    CIPROFLOXACIN >=8 RESISTANT Resistant     ERYTHROMYCIN >=8 RESISTANT Resistant     GENTAMICIN <=0.5 SENSITIVE Sensitive     OXACILLIN <=0.25 SENSITIVE Sensitive     TETRACYCLINE <=1 SENSITIVE Sensitive     VANCOMYCIN 1 SENSITIVE Sensitive     TRIMETH/SULFA <=10 SENSITIVE Sensitive     CLINDAMYCIN <=0.25 SENSITIVE Sensitive     RIFAMPIN <=0.5 SENSITIVE Sensitive     Inducible Clindamycin NEGATIVE Sensitive     * STAPHYLOCOCCUS AUREUS  Blood Culture ID Panel (Reflexed)     Status: Abnormal   Collection Time: 05/09/19  1:10 PM  Result Value Ref Range Status   Enterococcus species NOT DETECTED NOT DETECTED Final   Listeria monocytogenes NOT DETECTED NOT DETECTED Final   Staphylococcus species DETECTED (A) NOT DETECTED Final    Comment: CRITICAL RESULT CALLED TO, READ BACK BY AND VERIFIED WITH: SCOTT HALL ON 05/10/19 AT 0052 [facility]    Staphylococcus aureus (BCID) DETECTED (A) NOT DETECTED Final    Comment: Methicillin (oxacillin) susceptible Staphylococcus aureus (MSSA). Preferred therapy is anti staphylococcal beta lactam antibiotic (Cefazolin or [name]), unless clinically contraindicated. CRITICAL RESULT CALLED TO, READ BACK BY AND VERIFIED WITH: SCOTT HALL ON 05/10/19 AT 0052 [facility]    Methicillin resistance NOT DETECTED NOT DETECTED Final   Streptococcus species NOT DETECTED NOT DETECTED Final   Streptococcus agalactiae NOT DETECTED NOT DETECTED Final   Streptococcus pneumoniae NOT DETECTED NOT DETECTED Final   Streptococcus pyogenes NOT DETECTED NOT DETECTED Final   Acinetobacter baumannii NOT DETECTED NOT DETECTED Final   Enterobacteriaceae species NOT DETECTED NOT DETECTED Final   Enterobacter cloacae complex  NOT DETECTED NOT DETECTED Final   Escherichia coli NOT DETECTED NOT DETECTED Final   Klebsiella oxytoca NOT DETECTED NOT DETECTED Final   Klebsiella pneumoniae NOT DETECTED NOT DETECTED Final   Proteus species NOT DETECTED NOT DETECTED Final   Serratia marcescens NOT DETECTED NOT DETECTED Final   Haemophilus influenzae NOT DETECTED NOT  DETECTED Final   Neisseria meningitidis NOT DETECTED NOT DETECTED Final   Pseudomonas aeruginosa NOT DETECTED NOT DETECTED Final   Candida albicans NOT DETECTED NOT DETECTED Final   Candida glabrata NOT DETECTED NOT DETECTED Final   Candida krusei NOT DETECTED NOT DETECTED Final   Candida parapsilosis NOT DETECTED NOT DETECTED Final   Candida tropicalis NOT DETECTED NOT DETECTED Final    Comment: Performed at [facility], [address] [place], [place] [address]  Culture, blood (routine x 2)     Status: Abnormal   Collection Time: 05/09/19  1:52 PM   Specimen: BLOOD  Result Value Ref Range Status   Specimen Description   Final    BLOOD BLOOD LEFT FOREARM Performed at [facility], [address] [place], [place] [address]    [facility]   Final    BOTTLES DRAWN AEROBIC AND ANAEROBIC Blood Culture adequate volume Performed at [facility], [address] [place], [place] [address]    Culture  Setup Time   Final    IN BOTH AEROBIC AND ANAEROBIC BOTTLES GRAM POSITIVE COCCI CRITICAL VALUE NOTED.  VALUE IS CONSISTENT WITH PREVIOUSLY REPORTED AND CALLED VALUE. Performed at [facility], [address] [place], [place] [address]    Culture (A)  Final    STAPHYLOCOCCUS AUREUS SUSCEPTIBILITIES PERFORMED ON PREVIOUS CULTURE WITHIN THE LAST 5 DAYS. Performed at [facility], [address] [place], [place] [address]    Report Status 05/12/2019 FINAL  Final  Respiratory Panel by RT PCR (Flu A&B, Covid) - Nasopharyngeal Swab     Status: None   Collection Time: 05/09/19  3:11 PM   Specimen:  Nasopharyngeal Swab  Result Value Ref Range Status   SARS Coronavirus 2 by RT PCR NEGATIVE NEGATIVE Final    Comment: (NOTE) SARS-CoV-2 target nucleic acids are NOT DETECTED. The SARS-CoV-2 RNA is generally detectable in upper respiratoy specimens during the acute phase of infection. The lowest concentration of SARS-CoV-2 viral copies this assay can detect is 131 copies/mL. A negative result does not preclude SARS-Cov-2 infection and should not be used as the sole basis for treatment or other patient management decisions. A negative result may occur with  improper specimen collection/handling, submission of specimen other than nasopharyngeal swab, presence of viral mutation(s) within the areas targeted by this assay, and inadequate number of viral copies (<131 copies/mL). A negative result must be combined with clinical observations, patient history, and epidemiological information. The expected result is Negative. Fact Sheet for Patients:  https://www.moore.com/https://www.fda.gov/media/142436/download Fact Sheet for Healthcare Providers:  https://www.young.biz/https://www.fda.gov/media/142435/download This test is not yet ap proved or cleared by the [place] FDA and  has been authorized for detection and/or diagnosis of SARS-CoV-2 by FDA under an Emergency Use Authorization (EUA). This EUA will remain  in effect (meaning this test can be used) for the duration of the COVID-19 declaration under Section 564(b)(1) of the Act, 21 U.S.C. section 360bbb-3(b)(1), unless the authorization is terminated or revoked sooner.    Influenza A by PCR NEGATIVE NEGATIVE Final   Influenza B by PCR NEGATIVE NEGATIVE Final    Comment: (NOTE) The Xpert Xpress SARS-CoV-2/FLU/RSV assay is intended as an aid in  the diagnosis of influenza from Nasopharyngeal swab specimens and  should not be used as a sole basis for treatment. Nasal washings and  aspirates are unacceptable for Xpert Xpress SARS-CoV-2/FLU/RSV  testing. Fact Sheet for  Patients: https://www.moore.com/https://www.fda.gov/media/142436/download Fact Sheet for Healthcare Providers: https://www.young.biz/https://www.fda.gov/media/142435/download This test is not yet  approved or cleared by the [place] and  has been authorized for detection and/or diagnosis of SARS-CoV-2 by  FDA under an Emergency Use Authorization (EUA). This EUA will remain  in effect (meaning this test can be used) for the duration of the  Covid-19 declaration under Section 564(b)(1) of the Act, 21  U.S.C. section 360bbb-3(b)(1), unless the authorization is  terminated or revoked. Performed at [facility], [address] [place], [place] [address]   CULTURE, BLOOD (ROUTINE X 2) w Reflex to ID Panel     Status: None (Preliminary result)   Collection Time: 05/12/19 12:38 AM   Specimen: BLOOD  Result Value Ref Range Status   Specimen Description BLOOD RAC  Final   Special Requests BOTTLES DRAWN AEROBIC AND ANAEROBIC BCAV  Final   Culture   Final    NO GROWTH 1 DAY Performed at [facility], [address] [place], [place] [address]    Report Status PENDING  Incomplete  CULTURE, BLOOD (ROUTINE X 2) w Reflex to ID Panel     Status: None (Preliminary result)   Collection Time: 05/12/19 12:46 AM   Specimen: BLOOD  Result Value Ref Range Status   Specimen Description BLOOD LAC  Final   Special Requests BOTTLES DRAWN AEROBIC AND ANAEROBIC BCAV  Final   Culture   Final    NO GROWTH 1 DAY Performed at [facility], [address], [place], [place] [address]    Report Status PENDING  Incomplete      Imaging Studies   ECHO TEE  Result Date: 03-Jun-2019    TRANSESOPHOGEAL ECHO REPORT   Patient Name:   FINTAN GRATER Date of Exam: Jun 03, 2019 Medical Rec #:  [id number]        Height:       71.0 in Accession #:    [id number]       Weight:       185.0 lb Date of Birth:  [birth year]        BSA:          2.040 m Patient Age:    43 years         BP:           97/69 mmHg Patient Gender: M                 HR:           95 bpm. Exam Location:  [facility] Procedure: Transesophageal Echo, Cardiac Doppler and Color Doppler Indications:     Not listed  History:         Patient has prior history of Echocardiogram examinations, most                  recent 05/11/2019. Stroke.  Sonographer:     Cristela Blue RDCS (AE) Referring Phys:  Lajuana Carry A KHAN Diagnosing Phys: Adrian Blackwater MD PROCEDURE: The transesophogeal probe was passed without difficulty through the esophogus of the patient. Sedation performed by performing physician. The patient's vital signs; including heart rate, blood pressure, and oxygen saturation; remained stable throughout the procedure. The patient developed no complications during the procedure. IMPRESSIONS  1. Left ventricular ejection fraction, by estimation, is 60 to 65%. The left ventricle has normal function. The left ventricle has no regional wall motion abnormalities.  2. Right ventricular systolic function is normal. The right ventricular size is normal.  3. No left atrial/left atrial appendage thrombus was detected.  4. The mitral valve is normal  in structure. No evidence of mitral valve regurgitation. No evidence of mitral stenosis.  5. 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 The aortic valve is tricuspid. Aortic valve regurgitation is mild to moderate. No aortic stenosis is present.  6. The inferior vena cava is normal in size with greater than 50% respiratory variability, suggesting right atrial pressure of 3 mmHg. Conclusion(s)/Recommendation(s): Findings are concerning for vegetation/infective endocarditis as detailed above. 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 FINDINGS  Left Ventricle: Left ventricular ejection fraction, by estimation, is 60 to 65%. The left ventricle has normal function. The left ventricle has no regional wall motion abnormalities. The left ventricular internal cavity size was normal in size. There is  no left ventricular hypertrophy. Right Ventricle:  The right ventricular size is normal. No increase in right ventricular wall thickness. Right ventricular systolic function is normal. Left Atrium: Left atrial size was normal in size. No left atrial/left atrial appendage thrombus was detected. Right Atrium: Right atrial size was normal in size. Pericardium: There is no evidence of pericardial effusion. Mitral Valve: The mitral valve is normal in structure. Normal mobility of the mitral valve leaflets. No evidence of mitral valve regurgitation. No evidence of mitral valve stenosis. Tricuspid Valve: The tricuspid valve is normal in structure. Tricuspid valve regurgitation is not demonstrated. No evidence of tricuspid stenosis. 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 The aortic valve is tricuspid. Aortic valve regurgitation is mild to moderate. No aortic stenosis is present. Pulmonic Valve: The pulmonic valve was normal in structure. Pulmonic valve regurgitation is not visualized. No evidence of pulmonic stenosis. Aorta: The aortic root is normal in size and structure. Venous: The inferior vena cava is normal in size with greater than 50% respiratory variability, suggesting right atrial pressure of 3 mmHg. IAS/Shunts: No atrial level shunt detected by color flow Doppler. Adrian Blackwater MD Electronically signed by Adrian Blackwater MD Signature Date/Time: 05/13/2019/9:02:59 AM    Final      Medications   Scheduled Meds: . aspirin EC  81 mg Oral Daily  . atorvastatin  40 mg Oral QHS  . enoxaparin (LOVENOX) injection  40 mg Subcutaneous Q24H   Continuous Infusions: .  ceFAZolin (ANCEF) IV 2 g (05/14/19 0344)       LOS: 5 days   45 minutes spent in care of patient including coordination of care, face to face time counseling patient.    Pennie Banter, DO [facility]   If 7PM-7AM, please contact night-coverage [email or web address] 05/14/2019, 7:19 AM

## 2019-05-14 NOTE — Progress Notes (Signed)
SUBJECTIVE: Patient doing well.  Denies shortness of breath or chest pain.   Vitals:   05/13/19 0934 05/13/19 1515 05/13/19 2020 05/14/19 0501  BP: 111/75 102/63 121/75 (!) 98/53  Pulse: 85 97 85 99  Resp: 16 16 16 16   Temp: 98.3 F (36.8 C) 98.6 F (37 C) 97.8 F (36.6 C) 98.8 F (37.1 C)  TempSrc:  Oral Oral Oral  SpO2: 99% 98% 99% 98%  Weight:      Height:        Intake/Output Summary (Last 24 hours) at 05/14/2019 1006 Last data filed at 05/14/2019 0820 Gross per 24 hour  Intake 200 ml  Output 1200 ml  Net -1000 ml    LABS: Basic Metabolic Panel: Recent Labs    05/14/19 0525  NA 135  K 4.0  CL 105  CO2 22  GLUCOSE 107*  BUN 12  CREATININE 1.13  CALCIUM 8.9   Liver Function Tests: Recent Labs    05/14/19 0525  AST 73*  ALT 94*  ALKPHOS 128*  BILITOT 0.6  PROT 8.2*  ALBUMIN 3.4*   No results for input(s): LIPASE, AMYLASE in the last 72 hours. CBC: No results for input(s): WBC, NEUTROABS, HGB, HCT, MCV, PLT in the last 72 hours. Cardiac Enzymes: No results for input(s): CKTOTAL, CKMB, CKMBINDEX, TROPONINI in the last 72 hours. BNP: Invalid input(s): POCBNP D-Dimer: No results for input(s): DDIMER in the last 72 hours. Hemoglobin A1C: No results for input(s): HGBA1C in the last 72 hours. Fasting Lipid Panel: Recent Labs    05/12/19 0038  CHOL 166  HDL 13*  LDLCALC UNABLE TO CALCULATE IF TRIGLYCERIDE OVER 400 mg/dL  TRIG 05/14/19*  CHOLHDL 562  LDLDIRECT 35.7   Thyroid Function Tests: No results for input(s): TSH, T4TOTAL, T3FREE, THYROIDAB in the last 72 hours.  Invalid input(s): FREET3 Anemia Panel: No results for input(s): VITAMINB12, FOLATE, FERRITIN, TIBC, IRON, RETICCTPCT in the last 72 hours.   PHYSICAL EXAM General: Well developed, well nourished, in no acute distress HEENT:  Normocephalic and atramatic Neck:  No JVD.  Lungs: Clear bilaterally to auscultation and percussion. Heart: HRRR . Normal S1 and S2. II/VI systolic  murmur Abdomen: Bowel sounds are positive, abdomen soft and non-tender  Msk:  Back normal, normal gait. Normal strength and tone for age. Extremities: No clubbing, cyanosis or edema.   Neuro: Alert and oriented X 3. Psych:  Good affect, responds appropriately  TELEMETRY: NSR. 80s/bpm.  ASSESSMENT AND PLAN: Patient admitted to the hospital and found to have MSSA bacteremia.  A TEE was completed yesterday and a 2 cm vegetation was found on his aortic valve.  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  With patient's medical history and the size of the vegetation patient may require valve replacement.  Will defer to CT surgery for the decision of this.  The situation was discussed with the patient who was tearful but all questions were answered.  We will continue to follow the patient.  Principal Problem:   Sepsis (HCC) Active Problems:   Leukocytosis   Hypotension   AKI (acute kidney injury) (HCC)   Bacteremia due to Staphylococcus aureus    13.0, NP-C 05/14/2019 10:06 AM

## 2019-05-15 LAB — CBC WITH DIFFERENTIAL/PLATELET
Abs Immature Granulocytes: 0.46 10*3/uL — ABNORMAL HIGH (ref 0.00–0.07)
Basophils Absolute: 0.1 10*3/uL (ref 0.0–0.1)
Basophils Relative: 1 %
Eosinophils Absolute: 0.1 10*3/uL (ref 0.0–0.5)
Eosinophils Relative: 1 %
HCT: 39 % (ref 39.0–52.0)
Hemoglobin: 12.3 g/dL — ABNORMAL LOW (ref 13.0–17.0)
Immature Granulocytes: 5 %
Lymphocytes Relative: 16 %
Lymphs Abs: 1.5 10*3/uL (ref 0.7–4.0)
MCH: 28.7 pg (ref 26.0–34.0)
MCHC: 31.5 g/dL (ref 30.0–36.0)
MCV: 90.9 fL (ref 80.0–100.0)
Monocytes Absolute: 1 10*3/uL (ref 0.1–1.0)
Monocytes Relative: 10 %
Neutro Abs: 6.6 10*3/uL (ref 1.7–7.7)
Neutrophils Relative %: 67 %
Platelets: 311 10*3/uL (ref 150–400)
RBC: 4.29 MIL/uL (ref 4.22–5.81)
RDW: 12.2 % (ref 11.5–15.5)
WBC: 9.7 10*3/uL (ref 4.0–10.5)
nRBC: 0 % (ref 0.0–0.2)

## 2019-05-15 LAB — COMPREHENSIVE METABOLIC PANEL
ALT: 76 U/L — ABNORMAL HIGH (ref 0–44)
AST: 54 U/L — ABNORMAL HIGH (ref 15–41)
Albumin: 3.3 g/dL — ABNORMAL LOW (ref 3.5–5.0)
Alkaline Phosphatase: 120 U/L (ref 38–126)
Anion gap: 10 (ref 5–15)
BUN: 10 mg/dL (ref 6–20)
CO2: 21 mmol/L — ABNORMAL LOW (ref 22–32)
Calcium: 8.8 mg/dL — ABNORMAL LOW (ref 8.9–10.3)
Chloride: 105 mmol/L (ref 98–111)
Creatinine, Ser: 1.08 mg/dL (ref 0.61–1.24)
GFR calc Af Amer: >60 mL/min (ref >60)
GFR calc non Af Amer: >60 mL/min (ref >60)
Glucose, Bld: 107 mg/dL — ABNORMAL HIGH (ref 70–99)
Potassium: 3.9 mmol/L (ref 3.5–5.1)
Sodium: 136 mmol/L (ref 135–145)
Total Bilirubin: 0.9 mg/dL (ref 0.3–1.2)
Total Protein: 7.9 g/dL (ref 6.5–8.1)

## 2019-05-15 LAB — RPR: RPR Ser Ql: NONREACTIVE

## 2019-05-15 MED ORDER — PODOPHYLLUM RESIN 25 % EX SOLN: Freq: Two times a day (BID) | CUTANEOUS | Status: DC

## 2019-05-15 MED ORDER — SODIUM CHLORIDE 0.9 % IV SOLN
INTRAVENOUS | Status: DC | PRN
  Administered 2019-05-15 – 2019-05-25 (×9): 250 mL via INTRAVENOUS

## 2019-05-15 NOTE — Progress Notes (Signed)
PROGRESS NOTE    Jorene MinorsCornelius Rood   ZOX:[id number]  DOB: 12/13/1975  PCP: Patient, No Pcp Per    DOA: 05/09/2019 LOS: 6   Brief Narrative   Jorene MinorsCornelius Christley is an 44 y.o. malewith medical history significant for HIV infection, chronic low back pain, stroke (occuredduring heart valve surgery) and heart valve surgery (does not know which valve), status post pacemaker, who presented to the hospital with fever and chills, progressive generalized weakness and dizziness, with a near fall. He went to [facility] on the day of admission where his temperature was 103 F in the emergency department. Due to long waiting times in the emergency department at [facility], he came to [facility] for further evaluation.  In the ED here, tachycardic and hypotensive, given boluses of IV fluids.  He was initially diagnosed with sepsis of unknown etiology, treated empirically with IV cefepime and vancomycin.  Blood culture grew MSSA and antibiotics were deescalated to IV cefazolin.  ID was consulted.  2D echo did not show any evidence of endocarditis so cardiology was consulted for TEE.   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   Mildly elevated liver enzymes: Hepatitis panel is negative  History of stroke: previously on aspirin and Lipitor but stopped taking them months ago (October or November 2020).   --resumed  on ASA, Lipitor   HIV infection: HIV RNA is 10,500.  Patient said he takes Atripla at home. 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  Permanent pacemaker status, history of heart valve surgery.  Order has been placed to obtain records from [facility] in [place].   Previous home medications: His significant other brought some medications that were filled in June 2020.  She said patient used to take them but he stopped taking them months ago.  These were low-dose aspirin, metoprolol, Lipitor, naproxen and cyclobenzaprine (chronic low back pain).     Patient BMI: Body mass index is 25.8 kg/m.   DVT prophylaxis: heparin Diet:  Diet Orders (From admission, onward)    Start     Ordered   05/13/19 0945  Diet regular Room service appropriate? Yes; Fluid consistency: Thin  Diet effective now    Question Answer Comment  Room service appropriate? Yes   Fluid consistency: Thin      05/13/19 0944            Code Status: Full Code    Subjective 05/15/19    Patient seen this AM at bedside, girlfriend present.  No acute events reported.  Patient reports feeling well today.  No fever or chills or other new complaints.  He understands we are waiting for [facility] for surgical planning purposes.  Disposition Plan & Communication   Dispo & Barriers: awaiting [facility] for [facility] to review.  Patient will need CT surgery, prior records needed for planning.  Patient continues to require inpatient  care for IV antibiotics for bacteremia with TEE-confirmed endocarditis. Coming from: home Exp d/c date: TBD Medically stable for d/c? no  Family Communication: none at bedside, will attempt to call    Consults, Procedures, Significant Events   Consultants:   Infectious Disease  Cardiology  Procedures:   2D Echo  TEE  Antimicrobials:   Ancef 4/18 >>    Objective   Vitals:   05/14/19  0501 05/14/19 1437 05/14/19 2041 05/15/19 0458  BP: (!) 98/53 113/66 123/79 107/64  Pulse: 99 98 86 90  Resp: 16 20 20 16   Temp: 98.8 F (37.1 C) 99 F (37.2 C) 98.3 F (36.8 C) 98.1 F (36.7 C)  TempSrc: Oral Oral Oral Oral  SpO2: 98% 99% 100% 98%  Weight:      Height:        Intake/Output Summary (Last 24 hours) at 05/15/2019 0717 Last data filed at 05/15/2019 0458 Gross per 24 hour  Intake 120 ml  Output 1950 ml  Net -1830 ml   Filed Weights   05/09/19 1305 05/13/19 0740  Weight: 83.9 kg 83.9 kg    Physical Exam:  General exam: awake, alert, no acute distress Respiratory system: CTAB, no wheezes, rales or rhonchi, normal respiratory effort. Cardiovascular system: normal S1/S2, RRR, 2/6 systolic murmur, no pedal edema.   Gastrointestinal system: soft, NT, ND, few warts seen perianal and single at the anal verge. Central nervous system: A&O x4. no gross focal neurologic deficits, normal speech Extremities: moves all, no edema, normal tone Psychiatry: depressed mood, congruent affect, judgement and insight appear normal  Labs   Data Reviewed: I have personally reviewed following labs and imaging studies  CBC: Recent Labs  Lab 05/09/19 1309 05/10/19 0533 05/13/19 0554 05/15/19 0412  WBC 15.8* 9.9 5.9 9.7  NEUTROABS  --   --  3.7 6.6  HGB 13.9 12.4* 12.6* 12.3*  HCT 42.1 37.9* 39.3 39.0  MCV 88.4 90.2 89 90.9  PLT 200 156 235 311   Basic Metabolic Panel: Recent Labs  Lab 05/09/19 1600 05/10/19 0533 05/11/19 0409 05/14/19 0525 05/15/19 0412  NA 135 135 135 135 136  K 4.1 3.6 3.8 4.0 3.9  CL 110 109 105 105 105  CO2 20* 21* 21* 22 21*  GLUCOSE 114* 116* 103* 107* 107*  BUN 16 13 11 12 10   CREATININE 1.69* 1.38* 1.22 1.13 1.08  CALCIUM 7.4* 8.3* 8.7* 8.9 8.8*   GFR: Estimated Creatinine Clearance: 95.9 mL/min (by C-G formula based on SCr of 1.08 mg/dL). Liver Function Tests: Recent Labs  Lab 05/09/19 1600 05/10/19 0533 05/11/19 0409  05/14/19 0525 05/15/19 0412  AST 43* 58* 61* 73* 54*  ALT 47* 60* 61* 94* 76*  ALKPHOS 60 65 67 128* 120  BILITOT 1.0 1.6* 1.6* 0.6 0.9  PROT 6.4* 7.1 7.2 8.2* 7.9  ALBUMIN 3.0* 3.2* 3.0* 3.4* 3.3*   No results for input(s): LIPASE, AMYLASE in the last 168 hours. No results for input(s): AMMONIA in the last 168 hours. Coagulation Profile: No results for input(s): INR, PROTIME in the last 168 hours. Cardiac Enzymes: No results for input(s): CKTOTAL, CKMB, CKMBINDEX, TROPONINI in the last 168 hours. BNP (last 3 results) No results for input(s): PROBNP in the last 8760 hours. HbA1C: No results for input(s): HGBA1C in the last 72 hours. CBG: No results for input(s): GLUCAP in the last 168 hours. Lipid Profile: No results for input(s): CHOL, HDL, LDLCALC, TRIG, CHOLHDL, LDLDIRECT in the last 72 hours. Thyroid Function Tests: No  results for input(s): TSH, T4TOTAL, FREET4, T3FREE, THYROIDAB in the last 72 hours. Anemia Panel: No results for input(s): VITAMINB12, FOLATE, FERRITIN, TIBC, IRON, RETICCTPCT in the last 72 hours. Sepsis Labs: Recent Labs  Lab 05/09/19 1332 05/09/19 1600 05/10/19 0533 05/11/19 0409  PROCALCITON  --  17.78 21.77 13.97  LATICACIDVEN 1.3  --   --   --     Recent Results (from the past 240 hour(s))  Culture, blood (routine x 2)     Status: Abnormal   Collection Time: 05/09/19  1:10 PM   Specimen: BLOOD  Result Value Ref Range Status   Specimen Description   Final    BLOOD RFA Performed at [facility], [address] [place], [place] [address]    Special Requests   Final    BOTTLES DRAWN AEROBIC AND ANAEROBIC Blood Culture results may not be optimal due to an excessive volume of blood received in culture bottles Performed at [facility], [address] [place], [place] [address]    Culture  Setup Time   Final    IN BOTH AEROBIC AND ANAEROBIC BOTTLES GRAM POSITIVE COCCI CRITICAL RESULT CALLED TO, READ BACK BY AND VERIFIED  WITH: SCOTT HALL ON 05/10/19 AT 0052 [facility] Performed at [facility], [address] [place], [place] [address]    Culture STAPHYLOCOCCUS AUREUS (A)  Final   Report Status 05/12/2019 FINAL  Final   Organism ID, Bacteria STAPHYLOCOCCUS AUREUS  Final      Susceptibility   Staphylococcus aureus - MIC*    CIPROFLOXACIN >=8 RESISTANT Resistant     ERYTHROMYCIN >=8 RESISTANT Resistant     GENTAMICIN <=0.5 SENSITIVE Sensitive     OXACILLIN <=0.25 SENSITIVE Sensitive     TETRACYCLINE <=1 SENSITIVE Sensitive     VANCOMYCIN 1 SENSITIVE Sensitive     TRIMETH/SULFA <=10 SENSITIVE Sensitive     CLINDAMYCIN <=0.25 SENSITIVE Sensitive     RIFAMPIN <=0.5 SENSITIVE Sensitive     Inducible Clindamycin NEGATIVE Sensitive     * STAPHYLOCOCCUS AUREUS  Blood Culture ID Panel (Reflexed)     Status: Abnormal   Collection Time: 05/09/19  1:10 PM  Result Value Ref Range Status   Enterococcus species NOT DETECTED NOT DETECTED Final   Listeria monocytogenes NOT DETECTED NOT DETECTED Final   Staphylococcus species DETECTED (A) NOT DETECTED Final    Comment: CRITICAL RESULT CALLED TO, READ BACK BY AND VERIFIED WITH: SCOTT HALL ON 05/10/19 AT 0052 [facility]    Staphylococcus aureus (BCID) DETECTED (A) NOT DETECTED Final    Comment: Methicillin (oxacillin) susceptible Staphylococcus aureus (MSSA). Preferred therapy is anti staphylococcal beta lactam antibiotic (Cefazolin or [name]), unless clinically contraindicated. CRITICAL RESULT CALLED TO, READ BACK BY AND VERIFIED WITH: SCOTT HALL ON 05/10/19 AT 0052 [facility]    Methicillin resistance NOT DETECTED NOT DETECTED Final   Streptococcus species NOT DETECTED NOT DETECTED Final   Streptococcus agalactiae NOT DETECTED NOT DETECTED Final   Streptococcus pneumoniae NOT DETECTED NOT DETECTED Final   Streptococcus pyogenes NOT DETECTED NOT DETECTED Final   Acinetobacter baumannii NOT DETECTED NOT DETECTED Final   Enterobacteriaceae species NOT DETECTED NOT DETECTED Final    Enterobacter cloacae complex NOT DETECTED NOT DETECTED Final   Escherichia coli NOT DETECTED NOT DETECTED Final   Klebsiella oxytoca NOT DETECTED NOT DETECTED Final   Klebsiella pneumoniae NOT DETECTED NOT DETECTED Final   Proteus species NOT DETECTED NOT DETECTED Final   Serratia marcescens NOT DETECTED NOT DETECTED Final   Haemophilus  influenzae NOT DETECTED NOT DETECTED Final   Neisseria meningitidis NOT DETECTED NOT DETECTED Final   Pseudomonas aeruginosa NOT DETECTED NOT DETECTED Final   Candida albicans NOT DETECTED NOT DETECTED Final   Candida glabrata NOT DETECTED NOT DETECTED Final   Candida krusei NOT DETECTED NOT DETECTED Final   Candida parapsilosis NOT DETECTED NOT DETECTED Final   Candida tropicalis NOT DETECTED NOT DETECTED Final    Comment: Performed at [facility], [address] [place], [place] [address]  Culture, blood (routine x 2)     Status: Abnormal   Collection Time: 05/09/19  1:52 PM   Specimen: BLOOD  Result Value Ref Range Status   Specimen Description   Final    BLOOD BLOOD LEFT FOREARM Performed at [facility], [address] [place], [place] [address]    Special Requests   Final    BOTTLES DRAWN AEROBIC AND ANAEROBIC Blood Culture adequate volume Performed at [facility], [address] [place], [place] [address]    Culture  Setup Time   Final    IN BOTH AEROBIC AND ANAEROBIC BOTTLES GRAM POSITIVE COCCI CRITICAL VALUE NOTED.  VALUE IS CONSISTENT WITH PREVIOUSLY REPORTED AND CALLED VALUE. Performed at [facility], [address] [place], [place] [address]    Culture (A)  Final    STAPHYLOCOCCUS AUREUS SUSCEPTIBILITIES PERFORMED ON PREVIOUS CULTURE WITHIN THE LAST 5 DAYS. Performed at [facility], [address] [place], [place] [address]    Report Status 05/12/2019 FINAL  Final  Respiratory Panel by RT PCR (Flu A&B, Covid) - Nasopharyngeal Swab     Status: None   Collection Time: 05/09/19   3:11 PM   Specimen: Nasopharyngeal Swab  Result Value Ref Range Status   SARS Coronavirus 2 by RT PCR NEGATIVE NEGATIVE Final    Comment: (NOTE) SARS-CoV-2 target nucleic acids are NOT DETECTED. The SARS-CoV-2 RNA is generally detectable in upper respiratoy specimens during the acute phase of infection. The lowest concentration of SARS-CoV-2 viral copies this assay can detect is 131 copies/mL. A negative result does not preclude SARS-Cov-2 infection and should not be used as the sole basis for treatment or other patient management decisions. A negative result may occur with  improper specimen collection/handling, submission of specimen other than nasopharyngeal swab, presence of viral mutation(s) within the areas targeted by this assay, and inadequate number of viral copies (<131 copies/mL). A negative result must be combined with clinical observations, patient history, and epidemiological information. The expected result is Negative. Fact Sheet for Patients:  PinkCheek.be Fact Sheet for Healthcare Providers:  GravelBags.it This test is not yet ap proved or cleared by the [place] FDA and  has been authorized for detection and/or diagnosis of SARS-CoV-2 by FDA under an Emergency Use Authorization (EUA). This EUA will remain  in effect (meaning this test can be used) for the duration of the COVID-19 declaration under Section 564(b)(1) of the Act, 21 U.S.C. section 360bbb-3(b)(1), unless the authorization is terminated or revoked sooner.    Influenza A by PCR NEGATIVE NEGATIVE Final   Influenza B by PCR NEGATIVE NEGATIVE Final    Comment: (NOTE) The Xpert Xpress SARS-CoV-2/FLU/RSV assay is intended as an aid in  the diagnosis of influenza from Nasopharyngeal swab specimens and  should not be used as a sole basis for treatment. Nasal washings and  aspirates are unacceptable for Xpert Xpress SARS-CoV-2/FLU/RSV   testing. Fact Sheet for Patients: PinkCheek.be Fact Sheet for Healthcare Providers: GravelBags.it This  test is not yet approved or cleared by the [place] and  has been authorized for detection and/or diagnosis of SARS-CoV-2 by  FDA under an Emergency Use Authorization (EUA). This EUA will remain  in effect (meaning this test can be used) for the duration of the  Covid-19 declaration under Section 564(b)(1) of the Act, 21  U.S.C. section 360bbb-3(b)(1), unless the authorization is  terminated or revoked. Performed at [facility], [address] [place], [place] [address]   CULTURE, BLOOD (ROUTINE X 2) w Reflex to ID Panel     Status: None (Preliminary result)   Collection Time: 05/12/19 12:38 AM   Specimen: BLOOD  Result Value Ref Range Status   Specimen Description BLOOD RAC  Final   Special Requests BOTTLES DRAWN AEROBIC AND ANAEROBIC BCAV  Final   Culture   Final    NO GROWTH 2 DAYS Performed at [facility], [address], [place], [place] [address]    Report Status PENDING  Incomplete  CULTURE, BLOOD (ROUTINE X 2) w Reflex to ID Panel     Status: None (Preliminary result)   Collection Time: 05/12/19 12:46 AM   Specimen: BLOOD  Result Value Ref Range Status   Specimen Description BLOOD LAC  Final   Special Requests BOTTLES DRAWN AEROBIC AND ANAEROBIC BCAV  Final   Culture   Final    NO GROWTH 2 DAYS Performed at [facility], [address], [place], [place] [address]    Report Status PENDING  Incomplete      Imaging Studies   ECHO TEE  Result Date: 05/14/19    TRANSESOPHOGEAL ECHO REPORT   Patient Name:   PRUITT TABOADA Date of Exam: 05/14/2019 Medical Rec #:  [id number]        Height:       71.0 in Accession #:    [id number]       Weight:       185.0 lb Date of Birth:  [birth year]        BSA:          2.040 m Patient Age:    43 years         BP:           97/69 mmHg  Patient Gender: M                HR:           95 bpm. Exam Location:  [facility] Procedure: Transesophageal Echo, Cardiac Doppler and Color Doppler Indications:     Not listed  History:         Patient has prior history of Echocardiogram examinations, most                  recent 05/11/2019. Stroke.  Sonographer:     Cristela Blue RDCS (AE) Referring Phys:  Lajuana Carry A KHAN Diagnosing Phys: Adrian Blackwater MD PROCEDURE: The transesophogeal probe was passed without difficulty through the esophogus of the patient. Sedation performed by performing physician. The patient's vital signs; including heart rate, blood pressure, and oxygen saturation; remained stable throughout the procedure. The patient developed no complications during the procedure. IMPRESSIONS  1. Left ventricular ejection fraction, by estimation, is 60 to 65%. The left ventricle has normal function. The left ventricle has no regional wall motion abnormalities.  2. Right ventricular systolic function is normal. The right ventricular size is normal.  3. No left atrial/left atrial appendage thrombus was detected.  4. The  mitral valve is normal in structure. No evidence of mitral valve regurgitation. No evidence of mitral stenosis.  5. 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 The aortic valve is tricuspid. Aortic valve regurgitation is mild to moderate. No aortic stenosis is present.  6. The inferior vena cava is normal in size with greater than 50% respiratory variability, suggesting right atrial pressure of 3 mmHg. Conclusion(s)/Recommendation(s): Findings are concerning for vegetation/infective endocarditis as detailed above. 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 FINDINGS  Left Ventricle: Left ventricular ejection fraction, by estimation, is 60 to 65%. The left ventricle has normal function. The left ventricle has no regional wall motion abnormalities. The left ventricular internal cavity size was normal in size. There is  no left ventricular  hypertrophy. Right Ventricle: The right ventricular size is normal. No increase in right ventricular wall thickness. Right ventricular systolic function is normal. Left Atrium: Left atrial size was normal in size. No left atrial/left atrial appendage thrombus was detected. Right Atrium: Right atrial size was normal in size. Pericardium: There is no evidence of pericardial effusion. Mitral Valve: The mitral valve is normal in structure. Normal mobility of the mitral valve leaflets. No evidence of mitral valve regurgitation. No evidence of mitral valve stenosis. Tricuspid Valve: The tricuspid valve is normal in structure. Tricuspid valve regurgitation is not demonstrated. No evidence of tricuspid stenosis. 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 The aortic valve is tricuspid. Aortic valve regurgitation is mild to moderate. No aortic stenosis is present. Pulmonic Valve: The pulmonic valve was normal in structure. Pulmonic valve regurgitation is not visualized. No evidence of pulmonic stenosis. Aorta: The aortic root is normal in size and structure. Venous: The inferior vena cava is normal in size with greater than 50% respiratory variability, suggesting right atrial pressure of 3 mmHg. IAS/Shunts: No atrial level shunt detected by color flow Doppler. Adrian Blackwater MD Electronically signed by Adrian Blackwater MD Signature Date/Time: 05/13/2019/9:02:59 AM    Final      Medications   Scheduled Meds: . aspirin EC  81 mg Oral Daily  . atorvastatin  40 mg Oral QHS  . enoxaparin (LOVENOX) injection  40 mg Subcutaneous Q24H  . podophyllum resin   Topical BID   Continuous Infusions: .  ceFAZolin (ANCEF) IV 2 g (05/15/19 0359)       LOS: 6 days   Time spent: 25 minutes    Pennie Banter, DO [facility]   If 7PM-7AM, please contact night-coverage [email or web address] 05/15/2019, 7:17 AM

## 2019-05-15 NOTE — Progress Notes (Signed)
Pharmacy Antibiotic Note  James Ferrell is a 44 y.o. male admitted on 05/09/2019 with MSSA bacteremia.  Pharmacy was consulted for cefazolin dosing. This is day  #6 of IV cefazolin, there is no residual leukocytosis or recent febrile episodes and his renal function has improved to what appears to be his baseline level. Newly drawn blood cultures are negative to date (4/20). TEE this morning reveals a  2x2cm on non coronary cusp of AV. ID following.   Plan: continue cefazolin 2 grams IV every 8 hours  Height: 5\' 11"  (180.3 cm) Weight: 83.9 kg (184 lb 15.5 oz) IBW/kg (Calculated) : 75.3  Temp (24hrs), Avg:98.5 F (36.9 C), Min:98.1 F (36.7 C), Max:99 F (37.2 C)  Recent Labs  Lab 05/09/19 1309 05/09/19 1309 05/09/19 1332 05/09/19 1600 05/10/19 0533 05/11/19 0409 05/13/19 0554 05/14/19 0525 05/15/19 0412  WBC 15.8*  --   --   --  9.9  --  5.9  --  9.7  CREATININE 1.86*   < >  --  1.69* 1.38* 1.22  --  1.13 1.08  LATICACIDVEN  --   --  1.3  --   --   --   --   --   --    < > = values in this interval not displayed.    Estimated Creatinine Clearance: 95.9 mL/min (by C-G formula based on SCr of 1.08 mg/dL).    No Known Allergies  Antimicrobials this admission: 4/17 cefepime x 1 4/17 vancomycin x1 Cefazolin 4/18 >>   Microbiology results: 4/20 BCx: NGTD 4/17 BCx: 4/4 MSSA  4/17 SARS CoV-2: negative  4/17 influenza A/B: negative  Thank you for allowing pharmacy to be a part of this patient's care.  5/17, PharmD, BCPS 05/15/2019 8:19 AM

## 2019-05-15 NOTE — Progress Notes (Signed)
SUBJECTIVE: Patient doing well.  Patient denies chest pain or shortness of breath.  Patient stated that CT surgery did come and speak with him yesterday and he is in more understanding of the situation.   Vitals:   05/14/19 0501 05/14/19 1437 05/14/19 2041 05/15/19 0458  BP: (!) 98/53 113/66 123/79 107/64  Pulse: 99 98 86 90  Resp: 16 20 20 16   Temp: 98.8 F (37.1 C) 99 F (37.2 C) 98.3 F (36.8 C) 98.1 F (36.7 C)  TempSrc: Oral Oral Oral Oral  SpO2: 98% 99% 100% 98%  Weight:      Height:        Intake/Output Summary (Last 24 hours) at 05/15/2019 0925 Last data filed at 05/15/2019 0458 Gross per 24 hour  Intake 120 ml  Output 1650 ml  Net -1530 ml    LABS: Basic Metabolic Panel: Recent Labs    05/14/19 0525 05/15/19 0412  NA 135 136  K 4.0 3.9  CL 105 105  CO2 22 21*  GLUCOSE 107* 107*  BUN 12 10  CREATININE 1.13 1.08  CALCIUM 8.9 8.8*   Liver Function Tests: Recent Labs    05/14/19 0525 05/15/19 0412  AST 73* 54*  ALT 94* 76*  ALKPHOS 128* 120  BILITOT 0.6 0.9  PROT 8.2* 7.9  ALBUMIN 3.4* 3.3*   No results for input(s): LIPASE, AMYLASE in the last 72 hours. CBC: Recent Labs    05/13/19 0554 05/15/19 0412  WBC 5.9 9.7  NEUTROABS 3.7 6.6  HGB 12.6* 12.3*  HCT 39.3 39.0  MCV 89 90.9  PLT 235 311   Cardiac Enzymes: No results for input(s): CKTOTAL, CKMB, CKMBINDEX, TROPONINI in the last 72 hours. BNP: Invalid input(s): POCBNP D-Dimer: No results for input(s): DDIMER in the last 72 hours. Hemoglobin A1C: No results for input(s): HGBA1C in the last 72 hours. Fasting Lipid Panel: No results for input(s): CHOL, HDL, LDLCALC, TRIG, CHOLHDL, LDLDIRECT in the last 72 hours. Thyroid Function Tests: No results for input(s): TSH, T4TOTAL, T3FREE, THYROIDAB in the last 72 hours.  Invalid input(s): FREET3 Anemia Panel: No results for input(s): VITAMINB12, FOLATE, FERRITIN, TIBC, IRON, RETICCTPCT in the last 72 hours.   PHYSICAL EXAM General: Well  developed, well nourished, in no acute distress HEENT:  Normocephalic and atramatic Neck:  No JVD.  Lungs: Clear bilaterally to auscultation and percussion. Heart: HRRR . Normal S1 and S2 without gallops or murmurs.  Abdomen: Bowel sounds are positive, abdomen soft and non-tender  Msk:  Back normal, normal gait. Normal strength and tone for age. Extremities: No clubbing, cyanosis or edema.   Neuro: Alert and oriented X 3. Psych:  Good affect, responds appropriately  TELEMETRY: Biventricular Paced. 84/bpm  ASSESSMENT AND PLAN: Patient admitted to the hospital and found to have MSSA bacteremia.  A TEE was completed yesterday and a 2 cm vegetation was found on his aortic valve.  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  With patient's medical history and the size of the vegetation patient may require valve replacement.  CT surgery awaiting records from [facility] regarding previous replacement valve to determine how to proceed. Patient more understanding of the situation this morning.Will sign off at this time and defer further management to CT surgery and Infectious disease. Please do not hesitate to re-consult if need be. Thank you for involving [facility] in the patient's care.  Principal Problem:   Sepsis (HCC) Active Problems:   Leukocytosis  Hypotension   AKI (acute kidney injury) ([place])   Bacteremia due to Staphylococcus aureus    Adaline Sill, NP-C 05/15/2019 9:25 AM

## 2019-05-16 NOTE — Progress Notes (Signed)
Order received from Dr Denton Lank to keep telemetry but discontinue continuous pulse ox

## 2019-05-16 NOTE — Progress Notes (Signed)
CCMD called to say that the patient is having wide QRS. VSS. Dr Denton Lank notified. EKG ordered

## 2019-05-16 NOTE — Progress Notes (Signed)
PROGRESS NOTE    James James   BOF:[id number]  DOB: May 10, 1975  PCP: Patient, No Pcp Per    DOA: 05/09/2019 LOS: 7   Brief Narrative   James Ferrell is an 44 y.o. malewith medical history significant for HIV infection, chronic low back pain, stroke (occuredduring heart valve surgery) and heart valve surgery (does not know which valve), status post pacemaker, who presented to the hospital with fever and chills, progressive generalized weakness and dizziness, with a near fall. He went to [facility] on the day of admission where his temperature was 103 F in the emergency department. Due to long waiting times in the emergency department at [facility], he came to [facility] for further evaluation.  In the ED here, tachycardic and hypotensive, given boluses of IV fluids.  He was initially diagnosed with sepsis of unknown etiology, treated empirically with IV cefepime and vancomycin.  Blood culture grew MSSA and antibiotics were deescalated to IV cefazolin.  ID was consulted.  2D echo did not show any evidence of endocarditis so cardiology was consulted for TEE which showed a 2x2 cm aortic valve vegetation.     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 Thelma Barge.   --Outside records from prior surgery at [facility] in [place] finally arrived, today will be faxed to on call CT surgeon at [facility] (Dr. Laneta Simmers) for review and consideration for surgery, if patient is deemed a candidate. --Continue IV Ancef --follow repeat cultures, no growth to date  Acute kidney injury, hypotension and hyponatremia: Resolved.    Mildly elevated liver enzymes: Hepatitis panel is negative  History of stroke: previously on aspirin and Lipitor but stopped taking them months ago (October or November 2020).   --resumed on ASA, Lipitor   HIV infection: HIV RNA is 10,500.  Patient said he takes Atripla at home. 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  Permanent pacemaker status, history of heart valve surgery.  Order has been placed to obtain records from [facility] in [place].   Previous home medications: His significant other brought some medications that were filled in June 2020.  She said patient used to take them but he stopped taking them months ago.  These were low-dose aspirin, metoprolol, Lipitor, naproxen and cyclobenzaprine (chronic low back pain).     Patient BMI: Body mass index is 25.8 kg/m.   DVT prophylaxis: heparin  Diet:  Diet Orders (From admission, onward)    Start     Ordered   05/13/19 0945  Diet regular Room service appropriate? Yes; Fluid consistency: Thin  Diet effective now    Question Answer Comment  Room service appropriate? Yes   Fluid consistency: Thin      05/13/19 0944            Code Status: Full Code    Subjective 05/16/19    Patient seen this AM at bedside.  No acute events reported.  He is asking to go home to clean up and to go to church today, wants to leave hospital for just couple hours.  Advised him it  would be d/c against medical advice and would jeopardize his life/health and delay consideration for surgery which is needed.  He is agreeable, but says would like to go smoke.  Otherwise reports he feels fine, no fever chills or other complaints.  Disposition Plan & Communication   Dispo & Barriers: records received from [facility] for CT surgery to review, faxed to Dr. Laneta Simmers, on call CTS at [facility] today.  Patient will need CT surgery if he's a candidate.  Patient continues to require  inpatient care for IV antibiotics for bacteremia with TEE-confirmed endocarditis. Coming from: home Exp d/c date: TBD Medically stable for d/c? no  Family Communication: spoke with patient's girlfriend by phone today.  She is not understanding the need for patient to remain in the hospital, wants to take him home.  Once I explained the infection is complex and involves the heart, requires  IV antibiotics, she was more agreeable with current plan.  This had to be reiterated several times before she seemed to understand.   Consults, Procedures, Significant Events   Consultants:   Infectious Disease  Cardiology  Cariothoracic surgery  Procedures:   2D Echo  TEE  Antimicrobials:   Ancef 4/18 >>    Objective   Vitals:   05/15/19 0458 05/15/19 1601 05/15/19 2025 05/16/19 0421  BP: 107/64 115/71 106/66 104/65  Pulse: 90 90 89 85  Resp: 16 16 16 16   Temp: 98.1 F (36.7 C) 97.9 F (36.6 C) 98.5 F (36.9 C) 98.5 F (36.9 C)  TempSrc: Oral Oral Oral Oral  SpO2: 98% 99% 99% 99%  Weight:      Height:        Intake/Output Summary (Last 24 hours) at 05/16/2019 0744 Last data filed at 05/16/2019 0443 Gross per 24 hour  Intake 703.13 ml  Output 700 ml  Net 3.13 ml   Filed Weights   05/09/19 1305 05/13/19 0740  Weight: 83.9 kg 83.9 kg    Physical Exam:  General exam: awake, alert, no acute distress Respiratory system: CTAB, normal respiratory effort. Cardiovascular system: normal S1/S2, RRR, 2/6 systolic murmur, no pedal edema.   Central nervous system: A&O x4. no gross focal neurologic deficits, normal speech Psychiatry: normal mood, congruent affect, judgement and insight appear normal  Labs   Data Reviewed: I have personally reviewed following labs and imaging studies  CBC: Recent Labs  Lab 05/09/19 1309 05/10/19 0533 05/13/19 0554 05/15/19 0412  WBC 15.8* 9.9 5.9 9.7  NEUTROABS  --   --  3.7 6.6  HGB 13.9 12.4* 12.6* 12.3*  HCT 42.1 37.9* 39.3 39.0    MCV 88.4 90.2 89 90.9  PLT 200 156 235 311   Basic Metabolic Panel: Recent Labs  Lab 05/09/19 1600 05/10/19 0533 05/11/19 0409 05/14/19 0525 05/15/19 0412  NA 135 135 135 135 136  K 4.1 3.6 3.8 4.0 3.9  CL 110 109 105 105 105  CO2 20* 21* 21* 22 21*  GLUCOSE 114* 116* 103* 107* 107*  BUN 16 13 11 12 10   CREATININE 1.69* 1.38* 1.22 1.13 1.08  CALCIUM 7.4* 8.3* 8.7* 8.9 8.8*   GFR: Estimated Creatinine Clearance: 95.9 mL/min (by C-G formula based on SCr of 1.08 mg/dL). Liver Function Tests: Recent Labs  Lab 05/09/19 1600 05/10/19 0533 05/11/19 0409 05/14/19 0525 05/15/19 0412  AST 43* 58* 61* 73* 54*  ALT 47* 60* 61* 94* 76*  ALKPHOS 60 65 67 128* 120  BILITOT 1.0 1.6* 1.6* 0.6 0.9  PROT 6.4*  7.1 7.2 8.2* 7.9  ALBUMIN 3.0* 3.2* 3.0* 3.4* 3.3*   No results for input(s): LIPASE, AMYLASE in the last 168 hours. No results for input(s): AMMONIA in the last 168 hours. Coagulation Profile: No results for input(s): INR, PROTIME in the last 168 hours. Cardiac Enzymes: No results for input(s): CKTOTAL, CKMB, CKMBINDEX, TROPONINI in the last 168 hours. BNP (last 3 results) No results for input(s): PROBNP in the last 8760 hours. HbA1C: No results for input(s): HGBA1C in the last 72 hours. CBG: No results for input(s): GLUCAP in the last 168 hours. Lipid Profile: No results for input(s): CHOL, HDL, LDLCALC, TRIG, CHOLHDL, LDLDIRECT in the last 72 hours. Thyroid Function Tests: No results for input(s): TSH, T4TOTAL, FREET4, T3FREE, THYROIDAB in the last 72 hours. Anemia Panel: No results for input(s): VITAMINB12, FOLATE, FERRITIN, TIBC, IRON, RETICCTPCT in the last 72 hours. Sepsis Labs: Recent Labs  Lab 05/09/19 1332 05/09/19 1600 05/10/19 0533 05/11/19 0409  PROCALCITON  --  17.78 21.77 13.97  LATICACIDVEN 1.3  --   --   --     Recent Results (from the past 240 hour(s))  Culture, blood (routine x 2)     Status: Abnormal   Collection Time: 05/09/19  1:10 PM    Specimen: BLOOD  Result Value Ref Range Status   Specimen Description   Final    BLOOD RFA Performed at [facility], [address], [place], [place] [address]    Special Requests   Final    BOTTLES DRAWN AEROBIC AND ANAEROBIC Blood Culture results may not be optimal due to an excessive volume of blood received in culture bottles Performed at [facility], [address] [place], [place] [address]    Culture  Setup Time   Final    IN BOTH AEROBIC AND ANAEROBIC BOTTLES GRAM POSITIVE COCCI CRITICAL RESULT CALLED TO, READ BACK BY AND VERIFIED WITH: SCOTT HALL ON 05/10/19 AT 0052 [facility] Performed at [facility], [address], [place], [place] [address]    Culture STAPHYLOCOCCUS AUREUS (A)  Final   Report Status 05/12/2019 FINAL  Final   Organism ID, Bacteria STAPHYLOCOCCUS AUREUS  Final      Susceptibility   Staphylococcus aureus - MIC*    CIPROFLOXACIN >=8 RESISTANT Resistant     ERYTHROMYCIN >=8 RESISTANT Resistant     GENTAMICIN <=0.5 SENSITIVE Sensitive     OXACILLIN <=0.25 SENSITIVE Sensitive     TETRACYCLINE <=1 SENSITIVE Sensitive     VANCOMYCIN 1 SENSITIVE Sensitive     TRIMETH/SULFA <=10 SENSITIVE Sensitive     CLINDAMYCIN <=0.25 SENSITIVE Sensitive     RIFAMPIN <=0.5 SENSITIVE Sensitive     Inducible Clindamycin NEGATIVE Sensitive     * STAPHYLOCOCCUS AUREUS  Blood Culture ID Panel (Reflexed)     Status: Abnormal   Collection Time: 05/09/19  1:10 PM  Result Value Ref Range Status   Enterococcus species NOT DETECTED NOT DETECTED Final   Listeria monocytogenes NOT DETECTED NOT DETECTED Final   Staphylococcus species DETECTED (A) NOT DETECTED Final    Comment: CRITICAL RESULT CALLED TO, READ BACK BY AND VERIFIED WITH: SCOTT HALL ON 05/10/19 AT 0052 [facility]    Staphylococcus aureus (BCID) DETECTED (A) NOT DETECTED Final    Comment: Methicillin (oxacillin) susceptible Staphylococcus aureus (MSSA). Preferred therapy is anti staphylococcal beta lactam  antibiotic (Cefazolin or [name]), unless clinically contraindicated. CRITICAL RESULT CALLED TO, READ BACK BY AND VERIFIED WITH: SCOTT HALL ON 05/10/19 AT 0052 [facility]  Methicillin resistance NOT DETECTED NOT DETECTED Final   Streptococcus species NOT DETECTED NOT DETECTED Final   Streptococcus agalactiae NOT DETECTED NOT DETECTED Final   Streptococcus pneumoniae NOT DETECTED NOT DETECTED Final   Streptococcus pyogenes NOT DETECTED NOT DETECTED Final   Acinetobacter baumannii NOT DETECTED NOT DETECTED Final   Enterobacteriaceae species NOT DETECTED NOT DETECTED Final   Enterobacter cloacae complex NOT DETECTED NOT DETECTED Final   Escherichia coli NOT DETECTED NOT DETECTED Final   Klebsiella oxytoca NOT DETECTED NOT DETECTED Final   Klebsiella pneumoniae NOT DETECTED NOT DETECTED Final   Proteus species NOT DETECTED NOT DETECTED Final   Serratia marcescens NOT DETECTED NOT DETECTED Final   Haemophilus influenzae NOT DETECTED NOT DETECTED Final   Neisseria meningitidis NOT DETECTED NOT DETECTED Final   Pseudomonas aeruginosa NOT DETECTED NOT DETECTED Final   Candida albicans NOT DETECTED NOT DETECTED Final   Candida glabrata NOT DETECTED NOT DETECTED Final   Candida krusei NOT DETECTED NOT DETECTED Final   Candida parapsilosis NOT DETECTED NOT DETECTED Final   Candida tropicalis NOT DETECTED NOT DETECTED Final    Comment: Performed at [facility], [address] [place], [place] [address]  Culture, blood (routine x 2)     Status: Abnormal   Collection Time: 05/09/19  1:52 PM   Specimen: BLOOD  Result Value Ref Range Status   Specimen Description   Final    BLOOD BLOOD LEFT FOREARM Performed at [facility], [address] [place], [place] [address]    Special Requests   Final    BOTTLES DRAWN AEROBIC AND ANAEROBIC Blood Culture adequate volume Performed at [facility], [address] [place], [place] [address]    Culture  Setup Time   Final     IN BOTH AEROBIC AND ANAEROBIC BOTTLES GRAM POSITIVE COCCI CRITICAL VALUE NOTED.  VALUE IS CONSISTENT WITH PREVIOUSLY REPORTED AND CALLED VALUE. Performed at [facility], [address] [place], [place] [address]    Culture (A)  Final    STAPHYLOCOCCUS AUREUS SUSCEPTIBILITIES PERFORMED ON PREVIOUS CULTURE WITHIN THE LAST 5 DAYS. Performed at [facility], [address] [place], [place] [address]    Report Status 05/12/2019 FINAL  Final  Respiratory Panel by RT PCR (Flu A&B, Covid) - Nasopharyngeal Swab     Status: None   Collection Time: 05/09/19  3:11 PM   Specimen: Nasopharyngeal Swab  Result Value Ref Range Status   SARS Coronavirus 2 by RT PCR NEGATIVE NEGATIVE Final    Comment: (NOTE) SARS-CoV-2 target nucleic acids are NOT DETECTED. The SARS-CoV-2 RNA is generally detectable in upper respiratoy specimens during the acute phase of infection. The lowest concentration of SARS-CoV-2 viral copies this assay can detect is 131 copies/mL. A negative result does not preclude SARS-Cov-2 infection and should not be used as the sole basis for treatment or other patient management decisions. A negative result may occur with  improper specimen collection/handling, submission of specimen other than nasopharyngeal swab, presence of viral mutation(s) within the areas targeted by this assay, and inadequate number of viral copies (<131 copies/mL). A negative result must be combined with clinical observations, patient history, and epidemiological information. The expected result is Negative. Fact Sheet for Patients:  https://www.moore.com/https://www.fda.gov/media/142436/download Fact Sheet for Healthcare Providers:  https://www.young.biz/https://www.fda.gov/media/142435/download This test is not yet ap proved or cleared by the [place] FDA and  has been authorized for detection and/or diagnosis of SARS-CoV-2 by FDA under an Emergency Use Authorization (EUA). This  EUA will remain  in effect (meaning this test can  be used) for the duration of the COVID-19 declaration under Section 564(b)(1) of the Act, 21 U.S.C. section 360bbb-3(b)(1), unless the authorization is terminated or revoked sooner.    Influenza A by PCR NEGATIVE NEGATIVE Final   Influenza B by PCR NEGATIVE NEGATIVE Final    Comment: (NOTE) The Xpert Xpress SARS-CoV-2/FLU/RSV assay is intended as an aid in  the diagnosis of influenza from Nasopharyngeal swab specimens and  should not be used as a sole basis for treatment. Nasal washings and  aspirates are unacceptable for Xpert Xpress SARS-CoV-2/FLU/RSV  testing. Fact Sheet for Patients: PinkCheek.be Fact Sheet for Healthcare Providers: GravelBags.it This test is not yet approved or cleared by the [place] FDA and  has been authorized for detection and/or diagnosis of SARS-CoV-2 by  FDA under an Emergency Use Authorization (EUA). This EUA will remain  in effect (meaning this test can be used) for the duration of the  Covid-19 declaration under Section 564(b)(1) of the Act, 21  U.S.C. section 360bbb-3(b)(1), unless the authorization is  terminated or revoked. Performed at [facility], [address] [place], [place] [address]   CULTURE, BLOOD (ROUTINE X 2) w Reflex to ID Panel     Status: None (Preliminary result)   Collection Time: 05/12/19 12:38 AM   Specimen: BLOOD  Result Value Ref Range Status   Specimen Description BLOOD RAC  Final   Special Requests BOTTLES DRAWN AEROBIC AND ANAEROBIC BCAV  Final   Culture   Final    NO GROWTH 4 DAYS Performed at [facility], [address] [place], [place] [address]    Report Status PENDING  Incomplete  CULTURE, BLOOD (ROUTINE X 2) w Reflex to ID Panel     Status: None (Preliminary result)   Collection Time: 05/12/19 12:46 AM   Specimen: BLOOD  Result Value Ref Range Status   Specimen Description BLOOD LAC  Final   Special Requests BOTTLES DRAWN  AEROBIC AND ANAEROBIC BCAV  Final   Culture   Final    NO GROWTH 4 DAYS Performed at [facility], [address], [place], [place] [address]    Report Status PENDING  Incomplete      Imaging Studies   No results found.   Medications   Scheduled Meds: . aspirin EC  81 mg Oral Daily  . atorvastatin  40 mg Oral QHS  . enoxaparin (LOVENOX) injection  40 mg Subcutaneous Q24H  . podophyllum resin   Topical BID   Continuous Infusions: . sodium chloride Stopped (05/16/19 0442)  .  ceFAZolin (ANCEF) IV 200 mL/hr at 05/16/19 0443       LOS: 7 days   Time spent: 45 minutes were spent in coordinating care, consulting with specialists and counseling patient and his significant other.    Ezekiel Slocumb, DO [facility]   If 7PM-7AM, please contact night-coverage [email or web address] 05/16/2019, 7:44 AM

## 2019-05-17 LAB — CULTURE, BLOOD (ROUTINE X 2)
Culture: NO GROWTH
Culture: NO GROWTH

## 2019-05-17 MED ORDER — CEFAZOLIN SODIUM-DEXTROSE 2-4 GM/100ML-% IV SOLN
2.0000 g | Freq: Three times a day (TID) | INTRAVENOUS | Status: DC
  Administered 2019-05-17 – 2019-05-25 (×23): 2 g via INTRAVENOUS
  Filled 2019-05-17 (×25): qty 100

## 2019-05-17 NOTE — Progress Notes (Signed)
SUBJECTIVE: Patient doing well, resting comfortably in bed, denies any chest pain or shortness of breath.   Vitals:   05/16/19 1518 05/16/19 1746 05/16/19 1927 05/17/19 0448  BP: (!) 106/58 116/74 104/71 115/73  Pulse: 89 97 96 92  Resp: 16 18 16 20   Temp: 98.3 F (36.8 C) (!) 97.5 F (36.4 C) 98.6 F (37 C) 97.6 F (36.4 C)  TempSrc:  Oral Oral Oral  SpO2:  98% 98% 99%  Weight:      Height:        Intake/Output Summary (Last 24 hours) at 05/17/2019 1035 Last data filed at 05/17/2019 0945 Gross per 24 hour  Intake 583.24 ml  Output 900 ml  Net -316.76 ml    LABS: Basic Metabolic Panel: Recent Labs    05/15/19 0412  NA 136  K 3.9  CL 105  CO2 21*  GLUCOSE 107*  BUN 10  CREATININE 1.08  CALCIUM 8.8*   Liver Function Tests: Recent Labs    05/15/19 0412  AST 54*  ALT 76*  ALKPHOS 120  BILITOT 0.9  PROT 7.9  ALBUMIN 3.3*   No results for input(s): LIPASE, AMYLASE in the last 72 hours. CBC: Recent Labs    05/15/19 0412  WBC 9.7  NEUTROABS 6.6  HGB 12.3*  HCT 39.0  MCV 90.9  PLT 311   Cardiac Enzymes: No results for input(s): CKTOTAL, CKMB, CKMBINDEX, TROPONINI in the last 72 hours. BNP: Invalid input(s): POCBNP D-Dimer: No results for input(s): DDIMER in the last 72 hours. Hemoglobin A1C: No results for input(s): HGBA1C in the last 72 hours. Fasting Lipid Panel: No results for input(s): CHOL, HDL, LDLCALC, TRIG, CHOLHDL, LDLDIRECT in the last 72 hours. Thyroid Function Tests: No results for input(s): TSH, T4TOTAL, T3FREE, THYROIDAB in the last 72 hours.  Invalid input(s): FREET3 Anemia Panel: No results for input(s): VITAMINB12, FOLATE, FERRITIN, TIBC, IRON, RETICCTPCT in the last 72 hours.   PHYSICAL EXAM General: Well developed, well nourished, in no acute distress HEENT:  Normocephalic and atramatic Neck:  No JVD.  Lungs: Clear bilaterally to auscultation and percussion. Heart: HRRR . Normal S1 and S2 without gallops or murmurs.   Abdomen: Bowel sounds are positive, abdomen soft and non-tender  Msk:  Back normal, normal gait. Normal strength and tone for age. Extremities: No clubbing, cyanosis or edema.   Neuro: Alert and oriented X 3. Psych:  Good affect, responds appropriately  TELEMETRY: Biventricular paced.  89/bpm  ASSESSMENT AND PLAN: Patient admitted to the hospital and found to have MSSA bacteremia. A TEE was completed and a 2 cm vegetation was found on his aortic valve.  Repeat blood cultures have been negative. 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 With patient's medical history and the size of the vegetation patient may require valve replacement.  CT surgery recommending CTA chest and CTA of coronaries to determine if patient will require surgery of his aortic root/valve.  Discussed the situation with the patient and we will continue to follow if it is decided he is not to have surgery.  Patient is in agreement for further outpatient management if this is the case.     Principal Problem:   Sepsis (HCC) Active Problems:   Leukocytosis   Hypotension   AKI (acute kidney injury) (HCC)   Bacteremia due to Staphylococcus aureus    05/17/19, NP-C 05/17/2019 10:35 AM

## 2019-05-17 NOTE — Progress Notes (Signed)
PROGRESS NOTE    James Ferrell   NAT:[id number]  DOB: 09-Sep-1975  PCP: Patient, No Pcp Per    DOA: 05/09/2019 LOS: 8   Brief Narrative   James Ferrell is an 44 y.o. malewith medical history significant for HIV infection, chronic low back pain, stroke (occuredduring heart valve surgery) and heart valve surgery (does not know which valve), status post pacemaker, who presented to the hospital with fever and chills, progressive generalized weakness and dizziness, with a near fall. He went to [facility] on the day of admission where his temperature was 103 F in the emergency department. Due to long waiting times in the emergency department at [facility], he came to [facility] for further evaluation.  In the ED here, tachycardic and hypotensive, given boluses of IV fluids.  He was initially diagnosed with sepsis of unknown etiology, treated empirically with IV cefepime and vancomycin.  Blood culture grew MSSA and antibiotics were deescalated to IV cefazolin.  ID was consulted.  2D echo did not show any evidence of endocarditis so cardiology was consulted for TEE which showed a 2x2 cm aortic valve vegetation.     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 Thelma Barge.   --Outside records from prior surgery at [facility] in [place] finally arrived, today will be faxed to on call CT surgeon at [facility] (Dr. Laneta Simmers) for review and consideration for surgery, if patient is deemed a candidate. --Continue IV Ancef --follow repeat cultures, no growth to date  Acute kidney injury, hypotension  and hyponatremia: Resolved.   Mildly elevated liver enzymes: Hepatitis panel is negative  History of embolic stroke, likely septic emboli in setting of prior bacteremia/endocarditis. Previously on aspirin and Lipitor but stopped taking them months ago (October or November 2020).   --resumed on ASA, Lipitor   HIV infection: HIV RNA is 10,500.  Patient said he takes Atripla at home. 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  Patient said he would have visitor bring his topical medication from home.  No acute issues.  Permanent pacemaker status, history of heart valve surgery.  Order has been placed to obtain records from [facility] in [place].   Previous home medications: His significant other brought some medications that were filled in June 2020.  She said patient used to take them but he stopped taking them months ago.  These were low-dose aspirin, metoprolol, Lipitor, naproxen and cyclobenzaprine (chronic low back pain).  Patient BMI: Body mass index is 25.8 kg/m.   DVT prophylaxis: heparin  Diet:  Diet Orders (From admission, onward)    Start     Ordered   05/13/19 0945  Diet regular Room service appropriate? Yes; Fluid consistency: Thin  Diet effective now    Question Answer Comment  Room service appropriate? Yes   Fluid consistency: Thin      05/13/19 0944            Code Status: Full Code    Subjective 05/17/19    Patient seen this AM at bedside.  No acute events reported.  Patient reports feeling well.  Is tired of being stuck in the hospital.  We discussed with cardiology had told him earlier this morning regarding long course of IV antibiotics versus high risk surgery.    Disposition Plan & Communication   Dispo & Barriers: Awaiting CT surgery to decide if patient is a candidate for surgery (for aortic valve endocarditis and pacemaker removal).  If no imminent surgery, expect discharge to prior home  environment with long course of IV antibiotics.  Patient continues to require inpatient care for IV antibiotics for bacteremia with TEE-confirmed endocarditis. Coming from: home Exp d/c date: TBD Medically stable for d/c? no  Family Communication: None at bedside during encounter, patient to update   Consults, Procedures, Significant Events   Consultants:   Infectious Disease  Cardiology  Cariothoracic surgery  Procedures:   2D Echo  TEE  Antimicrobials:   Ancef 4/18 >>    Objective   Vitals:   05/16/19 1518 05/16/19 1746 05/16/19 1927 05/17/19 0448  BP: (!) 106/58 116/74 104/71 115/73  Pulse: 89 97 96 92  Resp: 16 18 16 20   Temp: 98.3 F (36.8 C) (!) 97.5 F (36.4 C) 98.6 F (37 C) 97.6 F (36.4 C)  TempSrc:  Oral Oral Oral  SpO2:  98% 98% 99%  Weight:      Height:        Intake/Output Summary (Last 24 hours) at 05/17/2019 0746 Last data filed at 05/17/2019 0538 Gross per 24 hour  Intake 343.24 ml  Output 1100 ml  Net -756.76 ml   Filed Weights   05/09/19 1305 05/13/19 0740  Weight: 83.9 kg 83.9 kg    Physical Exam:  General exam: awake, alert, no acute distress Respiratory system: CTAB, normal respiratory effort. Cardiovascular system: normal S1/S2, RRR, 2/6 systolic murmur sounds stable, no pedal edema.   Central nervous system: A&O x4. no gross focal neurologic deficits, normal speech Psychiatry: normal mood, congruent affect, judgement and insight appear normal  Labs   Data Reviewed: I have personally reviewed following labs and imaging studies  CBC: Recent Labs  Lab 05/13/19 0554 05/15/19 0412  WBC 5.9 9.7  NEUTROABS 3.7 6.6  HGB 12.6* 12.3*  HCT 39.3 39.0  MCV 89 90.9  PLT 235 311   Basic Metabolic Panel: Recent Labs  Lab 05/11/19 0409 05/14/19 0525 05/15/19 0412  NA 135 135 136  K 3.8 4.0 3.9  CL 105 105 105  CO2 21* 22 21*  GLUCOSE 103* 107* 107*  BUN 11 12 10   CREATININE 1.22 1.13 1.08  CALCIUM 8.7* 8.9 8.8*    GFR: Estimated Creatinine Clearance: 95.9 mL/min (by C-G formula based on SCr of 1.08 mg/dL). Liver Function Tests: Recent Labs  Lab 05/11/19 0409 05/14/19 0525 05/15/19 0412  AST 61* 73* 54*  ALT 61* 94* 76*  ALKPHOS 67 128* 120  BILITOT 1.6* 0.6 0.9  PROT 7.2 8.2* 7.9  ALBUMIN 3.0* 3.4* 3.3*   No results for input(s): LIPASE, AMYLASE in the last 168 hours. No results for input(s): AMMONIA in the last 168 hours. Coagulation Profile: No results for input(s): INR, PROTIME in the last 168 hours. Cardiac Enzymes: No results for input(s): CKTOTAL, CKMB, CKMBINDEX, TROPONINI in the last 168 hours. BNP (last 3 results) No results for input(s): PROBNP in the last 8760 hours. HbA1C: No results for input(s): HGBA1C in the last 72 hours. CBG: No results for input(s): GLUCAP in the last 168 hours. Lipid Profile: No results for input(s): CHOL, HDL, LDLCALC, TRIG,  CHOLHDL, LDLDIRECT in the last 72 hours. Thyroid Function Tests: No results for input(s): TSH, T4TOTAL, FREET4, T3FREE, THYROIDAB in the last 72 hours. Anemia Panel: No results for input(s): VITAMINB12, FOLATE, FERRITIN, TIBC, IRON, RETICCTPCT in the last 72 hours. Sepsis Labs: Recent Labs  Lab 05/11/19 0409  PROCALCITON 13.97    Recent Results (from the past 240 hour(s))  Culture, blood (routine x 2)     Status: Abnormal   Collection Time: 05/09/19  1:10 PM   Specimen: BLOOD  Result Value Ref Range Status   Specimen Description   Final    BLOOD RFA Performed at [facility], [address], [place], [place] [address]    Special Requests   Final    BOTTLES DRAWN AEROBIC AND ANAEROBIC Blood Culture results may not be optimal due to an excessive volume of blood received in culture bottles Performed at [facility], [address] [place], [place] [address]    Culture  Setup Time   Final    IN BOTH AEROBIC AND ANAEROBIC BOTTLES GRAM POSITIVE COCCI CRITICAL RESULT CALLED TO, READ BACK BY AND  VERIFIED WITH: SCOTT HALL ON 05/10/19 AT 0052 [facility] Performed at [facility], [address] [place], [place] [address]    Culture STAPHYLOCOCCUS AUREUS (A)  Final   Report Status 05/12/2019 FINAL  Final   Organism ID, Bacteria STAPHYLOCOCCUS AUREUS  Final      Susceptibility   Staphylococcus aureus - MIC*    CIPROFLOXACIN >=8 RESISTANT Resistant     ERYTHROMYCIN >=8 RESISTANT Resistant     GENTAMICIN <=0.5 SENSITIVE Sensitive     OXACILLIN <=0.25 SENSITIVE Sensitive     TETRACYCLINE <=1 SENSITIVE Sensitive     VANCOMYCIN 1 SENSITIVE Sensitive     TRIMETH/SULFA <=10 SENSITIVE Sensitive     CLINDAMYCIN <=0.25 SENSITIVE Sensitive     RIFAMPIN <=0.5 SENSITIVE Sensitive     Inducible Clindamycin NEGATIVE Sensitive     * STAPHYLOCOCCUS AUREUS  Blood Culture ID Panel (Reflexed)     Status: Abnormal   Collection Time: 05/09/19  1:10 PM  Result Value Ref Range Status   Enterococcus species NOT DETECTED NOT DETECTED Final   Listeria monocytogenes NOT DETECTED NOT DETECTED Final   Staphylococcus species DETECTED (A) NOT DETECTED Final    Comment: CRITICAL RESULT CALLED TO, READ BACK BY AND VERIFIED WITH: SCOTT HALL ON 05/10/19 AT 0052 [facility]    Staphylococcus aureus (BCID) DETECTED (A) NOT DETECTED Final    Comment: Methicillin (oxacillin) susceptible Staphylococcus aureus (MSSA). Preferred therapy is anti staphylococcal beta lactam antibiotic (Cefazolin or [name]), unless clinically contraindicated. CRITICAL RESULT CALLED TO, READ BACK BY AND VERIFIED WITH: SCOTT HALL ON 05/10/19 AT 0052 [facility]    Methicillin resistance NOT DETECTED NOT DETECTED Final   Streptococcus species NOT DETECTED NOT DETECTED Final   Streptococcus agalactiae NOT DETECTED NOT DETECTED Final   Streptococcus pneumoniae NOT DETECTED NOT DETECTED Final   Streptococcus pyogenes NOT DETECTED NOT DETECTED Final   Acinetobacter baumannii NOT DETECTED NOT DETECTED Final   Enterobacteriaceae species NOT DETECTED NOT  DETECTED Final   Enterobacter cloacae complex NOT DETECTED NOT DETECTED Final   Escherichia coli NOT DETECTED NOT DETECTED Final   Klebsiella oxytoca NOT DETECTED NOT DETECTED Final   Klebsiella pneumoniae NOT DETECTED NOT DETECTED Final   Proteus species NOT DETECTED NOT DETECTED Final   Serratia marcescens NOT DETECTED NOT DETECTED Final   Haemophilus influenzae NOT DETECTED NOT DETECTED Final   Neisseria meningitidis NOT DETECTED  NOT DETECTED Final   Pseudomonas aeruginosa NOT DETECTED NOT DETECTED Final   Candida albicans NOT DETECTED NOT DETECTED Final   Candida glabrata NOT DETECTED NOT DETECTED Final   Candida krusei NOT DETECTED NOT DETECTED Final   Candida parapsilosis NOT DETECTED NOT DETECTED Final   Candida tropicalis NOT DETECTED NOT DETECTED Final    Comment: Performed at [facility], [address] [place], [place] [address]  Culture, blood (routine x 2)     Status: Abnormal   Collection Time: 05/09/19  1:52 PM   Specimen: BLOOD  Result Value Ref Range Status   Specimen Description   Final    BLOOD BLOOD LEFT FOREARM Performed at [facility], [address], [place], [place] [address]    Special Requests   Final    BOTTLES DRAWN AEROBIC AND ANAEROBIC Blood Culture adequate volume Performed at [facility], [address] [place], [place] [address]    Culture  Setup Time   Final    IN BOTH AEROBIC AND ANAEROBIC BOTTLES GRAM POSITIVE COCCI CRITICAL VALUE NOTED.  VALUE IS CONSISTENT WITH PREVIOUSLY REPORTED AND CALLED VALUE. Performed at [facility], [address] [place], [place] [address]    Culture (A)  Final    STAPHYLOCOCCUS AUREUS SUSCEPTIBILITIES PERFORMED ON PREVIOUS CULTURE WITHIN THE LAST 5 DAYS. Performed at [facility], [address], [place], [place] [address]    Report Status 05/12/2019 FINAL  Final  Respiratory Panel by RT PCR (Flu A&B, Covid) - Nasopharyngeal Swab     Status: None   Collection  Time: 05/09/19  3:11 PM   Specimen: Nasopharyngeal Swab  Result Value Ref Range Status   SARS Coronavirus 2 by RT PCR NEGATIVE NEGATIVE Final    Comment: (NOTE) SARS-CoV-2 target nucleic acids are NOT DETECTED. The SARS-CoV-2 RNA is generally detectable in upper respiratoy specimens during the acute phase of infection. The lowest concentration of SARS-CoV-2 viral copies this assay can detect is 131 copies/mL. A negative result does not preclude SARS-Cov-2 infection and should not be used as the sole basis for treatment or other patient management decisions. A negative result may occur with  improper specimen collection/handling, submission of specimen other than nasopharyngeal swab, presence of viral mutation(s) within the areas targeted by this assay, and inadequate number of viral copies (<131 copies/mL). A negative result must be combined with clinical observations, patient history, and epidemiological information. The expected result is Negative. Fact Sheet for Patients:  https://www.moore.com/ Fact Sheet for Healthcare Providers:  https://www.young.biz/ This test is not yet ap proved or cleared by the [place] FDA and  has been authorized for detection and/or diagnosis of SARS-CoV-2 by FDA under an Emergency Use Authorization (EUA). This EUA will remain  in effect (meaning this test can be used) for the duration of the COVID-19 declaration under Section 564(b)(1) of the Act, 21 U.S.C. section 360bbb-3(b)(1), unless the authorization is terminated or revoked sooner.    Influenza A by PCR NEGATIVE NEGATIVE Final   Influenza B by PCR NEGATIVE NEGATIVE Final    Comment: (NOTE) The Xpert Xpress SARS-CoV-2/FLU/RSV assay is intended as an aid in  the diagnosis of influenza from Nasopharyngeal swab specimens and  should not be used as a sole basis for treatment. Nasal washings and  aspirates are unacceptable for Xpert Xpress  SARS-CoV-2/FLU/RSV  testing. Fact Sheet for Patients: https://www.moore.com/ Fact Sheet for Healthcare Providers: https://www.young.biz/ This test is not yet approved or cleared by the [place] FDA  and  has been authorized for detection and/or diagnosis of SARS-CoV-2 by  FDA under an Emergency Use Authorization (EUA). This EUA will remain  in effect (meaning this test can be used) for the duration of the  Covid-19 declaration under Section 564(b)(1) of the Act, 21  U.S.C. section 360bbb-3(b)(1), unless the authorization is  terminated or revoked. Performed at [facility], [address] [place], [place] [address]   CULTURE, BLOOD (ROUTINE X 2) w Reflex to ID Panel     Status: None   Collection Time: 05/12/19 12:38 AM   Specimen: BLOOD  Result Value Ref Range Status   Specimen Description BLOOD RAC  Final   Special Requests BOTTLES DRAWN AEROBIC AND ANAEROBIC BCAV  Final   Culture   Final    NO GROWTH 5 DAYS Performed at [facility], [address] [place], [place] [address]    Report Status 05/17/2019 FINAL  Final  CULTURE, BLOOD (ROUTINE X 2) w Reflex to ID Panel     Status: None   Collection Time: 05/12/19 12:46 AM   Specimen: BLOOD  Result Value Ref Range Status   Specimen Description BLOOD LAC  Final   Special Requests BOTTLES DRAWN AEROBIC AND ANAEROBIC BCAV  Final   Culture   Final    NO GROWTH 5 DAYS Performed at [facility], [address] [place], [place] [address]    Report Status 05/17/2019 FINAL  Final      Imaging Studies   No results found.   Medications   Scheduled Meds: . aspirin EC  81 mg Oral Daily  . atorvastatin  40 mg Oral QHS  . enoxaparin (LOVENOX) injection  40 mg Subcutaneous Q24H  . podophyllum resin   Topical BID   Continuous Infusions: . sodium chloride Stopped (05/16/19 1434)  .  ceFAZolin (ANCEF) IV 2 g (05/17/19 0353)       LOS: 8 days   Time  spent:  20 minutes   Ezekiel Slocumb, DO [facility]   If 7PM-7AM, please contact night-coverage [email or web address] 05/17/2019, 7:46 AM

## 2019-05-17 NOTE — Progress Notes (Signed)
Patient ID: James Ferrell, male   DOB: [birth year], 44 y.o.   MRN: [id number] TCTS:  Asked by Dr. Denton Lank to review his chart and old op note/discharge summary from his hospitalization in 03/2018 for endocarditis.  He is a 44 year old gentleman with HIV who was treated at [facility] in [place] from 03/17/2018 to 04/04/2018 when he presented with strep pneumoniae aortic valve endocarditis and sepsis with severe AI, aortic root abscess, and moderate to severe mitral regurgitation.  On 03/20/2018 he underwent aortic root replacement with a homograft with reconstruction of the aorto mitral continuity and repair of a right atrial fistula, mitral valve repair, and epicardial pacemaker placement.  He reportedly had an uncomplicated procedure but was slow to wake up and had right upper extremity weakness.  CT scan of the head showed bilateral paramedian frontal lobe infarcts.  He recovered and was sent to rehab on postoperative day 15.  He moved to [place] and now presented to [facility] on 05/09/2019 with a several day history of generalized weakness and dizziness.  He went to [facility] with a temperature of 103 but left the emergency room because of the long wait and went to [facility] where he was found to be septic with tachypnea, tachycardia, and hypotension.  White blood cell count was 15.8 with normal lactic acid.  He had positive blood cultures for MSSA and was started on intravenous antibiotics.  A 2D echocardiogram showed moderate thickening of the aortic valve with trivial regurgitation.  The aortic root diameter was measured at 4.75 cm.  He had a TEE on 05/13/2019 which I have reviewed.  It is suboptimal quality but there appears to be some abnormality of the noncoronary leaflet possibly with some vegetation but there is trivial regurgitation.  There is some lucency around the aortic root but that is difficult to interpret after his prior surgery.  His white blood cell  count has returned to normal and he has been afebrile.  Follow-up blood cultures on 05/12/2019 were negative.  He has been hemodynamically stable with normal pulse pressure.  He is pacemaker dependent since his  surgery.  I don't think there is any indication for surgical treatment at this time. His sepsis is controlled, follow up [facility] are negative and the valve is working ok. He has some dilatation of the proximal ascending aorta but that is hard to interpret after his surgery. There is no op note, just mention of the procedure performed in the DC summary. It is not clear how much of his aorta was replaced. He had a complete aortic root replacement with a homograft with complex reconstruction of the aortomitral continuity, MV repair and closure of a right atrial fistula so he had a lot of damage with his original endocarditis. Homografts are very resistant to reinfection. I would not consider replacing his aortic root again unless his endocarditis progressed and destroyed his aortic valve and root. His operative mortality for redo homograft root replacement would be high due to the redo nature of this surgery, the difficulty in getting everything apart and reconstructing it. I think it would be worth doing a gated cardiac CTA and CTA of the chest to completely evaluate the aortic root, coronary arteries, and ascending aorta.  Obviously this evaluation is limited by the fact that he has at [facility] and I have not seen the patient in person although he has been seen by Dr. Thelma Barge.

## 2019-05-18 ENCOUNTER — Inpatient Hospital Stay: Payer: Medicaid Other

## 2019-05-18 ENCOUNTER — Encounter: Payer: Self-pay | Admitting: Internal Medicine

## 2019-05-18 MED ORDER — DARUN-COBIC-EMTRICIT-TENOFAF 800-150-200-10 MG PO TABS: 1.0000 | ORAL_TABLET | Freq: Every day | ORAL | Status: DC

## 2019-05-18 MED ORDER — ATORVASTATIN CALCIUM 40 MG PO TABS: 40.0000 mg | ORAL_TABLET | Freq: Every day | ORAL | Status: DC

## 2019-05-18 MED ORDER — ASPIRIN 81 MG PO TBEC: 81.0000 mg | DELAYED_RELEASE_TABLET | Freq: Every day | ORAL | Status: DC

## 2019-05-18 MED ORDER — ONDANSETRON HCL 4 MG PO TABS: 4.0000 mg | ORAL_TABLET | Freq: Four times a day (QID) | ORAL | 0 refills | Status: DC | PRN

## 2019-05-18 MED ORDER — IOHEXOL 350 MG/ML SOLN
75.0000 mL | Freq: Once | INTRAVENOUS | Status: AC | PRN
  Administered 2019-05-18: 75 mL via INTRAVENOUS

## 2019-05-18 MED ORDER — DARUN-COBIC-EMTRICIT-TENOFAF 800-150-200-10 MG PO TABS
1.0000 | ORAL_TABLET | Freq: Every day | ORAL | Status: DC
  Filled 2019-05-18 (×2): qty 1

## 2019-05-18 MED ORDER — CEFAZOLIN SODIUM-DEXTROSE 2-4 GM/100ML-% IV SOLN: 2.0000 g | Freq: Three times a day (TID) | INTRAVENOUS | Status: DC

## 2019-05-18 MED ORDER — ACETAMINOPHEN 325 MG PO TABS: 650.0000 mg | ORAL_TABLET | Freq: Four times a day (QID) | ORAL | Status: DC | PRN

## 2019-05-18 MED ORDER — ENOXAPARIN SODIUM 40 MG/0.4ML ~~LOC~~ SOLN: 40.0000 mg | SUBCUTANEOUS | Status: DC

## 2019-05-18 NOTE — Progress Notes (Signed)
Pharmacy Antibiotic Note  James Ferrell is a 44 y.o. male admitted on 05/09/2019 with MSSA bacteremia.  Pharmacy was consulted for cefazolin dosing. This is day  #6 of IV cefazolin, there is no residual leukocytosis or recent febrile episodes and his renal function has improved to what appears to be his baseline level. Newly drawn blood cultures are negative to date (4/20). TEE this morning reveals a  2x2cm on non coronary cusp of AV. ID following.  -Endocarditis  Plan: continue cefazolin 2 grams IV every 8 hours  Height: 5\' 11"  (180.3 cm) Weight: 83.9 kg (184 lb 15.5 oz) IBW/kg (Calculated) : 75.3  Temp (24hrs), Avg:98.5 F (36.9 C), Min:98.3 F (36.8 C), Max:98.6 F (37 C)  Recent Labs  Lab 05/13/19 0554 05/14/19 0525 05/15/19 0412  WBC 5.9  --  9.7  CREATININE  --  1.13 1.08    Estimated Creatinine Clearance: 95.9 mL/min (by C-G formula based on SCr of 1.08 mg/dL).    No Known Allergies  Antimicrobials this admission: 4/17 cefepime x 1 4/17 vancomycin x1 Cefazolin 4/18 >>   Microbiology results: 4/20 BCx: NGTD 4/17 BCx: 4/4 MSSA  4/17 SARS CoV-2: negative  4/17 influenza A/B: negative  Thank you for allowing pharmacy to be a part of this patient's care.  Alysse Rathe A, PharmD, BCPS 05/18/2019 11:12 AM

## 2019-05-18 NOTE — Discharge Summary (Addendum)
Physician Discharge Summary  Josip Merolla AVW:[id number] DOB: 1975/05/15 DOA: 05/09/2019  PCP: Patient, No Pcp Per  Admit date: 05/09/2019 Discharge date: 05/19/2019  Admitted From: home Disposition:  Transferring to [facility]  Recommendations for Outpatient Follow-up:  1. Per [facility] upon discharge  Home Health: n/a Equipment/Devices: n/a   Discharge Condition: stable  CODE STATUS: full  Diet recommendation: Regular   Discharge Diagnoses: Principal Problem:   Sepsis (HCC) Active Problems:   Leukocytosis   Hypotension   AKI (acute kidney injury) (HCC)   Bacteremia due to Staphylococcus aureus    Summary of HPI and Hospital Course:   Trong Gosling an 44 y.o.malewith medical history significant forHIV infection, chronic low back pain,stroke (occuredduring heart valve surgery) and heart valve surgery (does not know which valve), status post pacemaker,who presented to the hospital with fever and chills, progressive generalized weakness and dizziness, with a near fall. He went to [facility] on the day of admission where his temperature was 103 F in the emergency department. Due to long waiting times in the emergency department at [facility], he came to [facility] for further evaluation.  In the ED here, tachycardic and hypotensive, given boluses of IV fluids. He was initially diagnosed with sepsis of unknown etiology, treated empirically with IV cefepime and vancomycin. Blood culture grew MSSAand antibiotics were deescalated to IV cefazolin. ID was consulted.  2D echo did not show any evidence of endocarditis so cardiology was consulted for TEE which showed a 2x2 cm aortic valve vegetation.    MSSA sepsis and bacteremia - sepsis present on admission.  Repeat cultures negative.  Suspect source is pacemaker. 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 With history of prior AV replacement, MV repair and additional  complex CT surgery for prior endocarditis (Feb 2020). 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 Teressa Senter, Dr. Jules Schick [facility] - given prior complex cardiac surgery, patient would be best served at tertiary academic medical center.  --Outside records from prior surgery at [facility] in [place] finally arrived, reviewed by CT surgeon at [facility] (Dr. Laneta Simmers) for review and consideration for surgery, recommend transfer to tertiary center for surgical consideration. --Case discussed with cardiology at [facility], patient accepted in transfer and awaiting a bed ACCEPTING PHYSICIAN is Dr. Mariane Baumgarten, cardiology service. --Continue IV Ancef --CTA Chest aorta today for surgical planning, rads push to [facility] when done  Chronic systolic CHF - euvolemic, compensated.  EF 50-55% on TTE on 05/11/19. Metoprolol on hold due to borderline BP's.  Situational Depression - pt denies depression history and reports was doing emotionally and psychologically well prior to his current illness.  Monitor closely.  Consider psych consult, medication if progressing.    Acute kidney injury, hypotension and hyponatremia: Resolved.   Mildly elevated liver enzymes: Hepatitis panel isnegative  History of embolic stroke, likely septic emboli in setting of prior bacteremia/endocarditis. Previously on aspirin and Lipitor but stopped taking them months ago(October or November 2020). --resumed on ASA, Lipitor   HIV infection: HIV RNA is10,500. Resumed on Symtuza.  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  Patient said he would have visitor bring his topical medication from home.  No acute issues.  Permanent pacemaker status, history of heart valve surgery. [facility] has been [facility] [place].   Previous home medications: His significant other brought some medications that were filled  in June 2020. She said patient used to take them but he stopped taking them months ago. These were low-dose aspirin, metoprolol, Lipitor, naproxen and cyclobenzaprine(chronic low back pain).   Discharge Instructions   Discharge Instructions    Diet - low sodium heart healthy   Complete by: As directed    Increase activity slowly   Complete by: As directed      Allergies as of 05/18/2019   No Known Allergies     Medication List    STOP taking these medications   ibuprofen 200 MG tablet Commonly known as: ADVIL     TAKE these medications   acetaminophen 325 MG tablet Commonly known as: TYLENOL Take 2 tablets (650 mg total) by mouth every 6 (six) hours as needed for mild pain (or Fever >/= 101).   aspirin 81 MG EC tablet Take 1 tablet (81 mg total) by mouth daily. Start taking on: May 19, 2019   atorvastatin 40 MG tablet Commonly known as: LIPITOR Take 1 tablet (40 mg total) by mouth at bedtime.   ceFAZolin 2-4 GM/100ML-% IVPB Commonly known as: ANCEF Inject 100 mLs (2 g total) into the vein every 8 (eight) hours. Start taking on: May 19, 2019   Darunavir-Cobicisctat-Emtricitabine-Tenofovir Alafenamide 800-150-200-10 MG Tabs Commonly known as: SYMTUZA Take 1 tablet by mouth daily with breakfast. Start taking on: May 19, 2019   enoxaparin 40 MG/0.4ML injection Commonly known as: LOVENOX Inject 0.4 mLs (40 mg total) into the skin daily. Start taking on: May 19, 2019   ondansetron 4 MG tablet Commonly known as: ZOFRAN Take 1 tablet (4 mg total) by mouth every 6 (six) hours as needed for nausea.       No Known Allergies  Consultations:  Infectious Disease  Cardiothoracic surgery  Cardiology   Procedures/Studies: DG Chest 1 View  Result Date: 05/09/2019 CLINICAL DATA:  Fever, weakness. EXAM: CHEST  1 VIEW COMPARISON:  None. FINDINGS: The heart size and mediastinal contours are within normal limits. No pneumothorax or pleural effusion is noted.  Left-sided pacemaker is noted. Sternotomy wires are noted. Both lungs are clear. The visualized skeletal structures are unremarkable. IMPRESSION: No active disease. Electronically Signed   By: Lupita Raider M.D.   On: 05/09/2019 14:44   CT ANGIO CHEST AORTA W/CM &/OR WO/CM  Result Date: 05/18/2019 CLINICAL DATA:  Preoperative planning. History of aortic valve agitation. EXAM: CT ANGIOGRAPHY CHEST WITH CONTRAST TECHNIQUE: Multidetector CT imaging of the chest was performed using the standard protocol during bolus administration of intravenous contrast. Multiplanar CT image reconstructions and MIPs were obtained to evaluate the vascular anatomy. CONTRAST:  13mL OMNIPAQUE IOHEXOL 350 MG/ML SOLN COMPARISON:  None. FINDINGS: Cardiovascular: Heart is normal size. Aorta normal caliber measuring maximally 3.6 cm at the sinuses of Valsalva. There is abnormal collection of contrast noted which extends from the aortic outflow tract just proximal to the anterior leaflet of the aortic valve. This is concerning for ruptured aortic outflow tract or pseudoaneurysm. This appears contained within area of soft tissue measuring approximately 4.2 x 3.0 cm near the aortic valve. Stranding noted around the ascending thoracic aorta. Mediastinum/Nodes: No mediastinal, hilar, or axillary adenopathy. Trachea and esophagus are unremarkable. Thyroid unremarkable. Lungs/Pleura: Scarring in the left lower lobe posteriorly. Trace right pleural effusion. Upper Abdomen: Imaging into the upper abdomen shows no acute findings. Musculoskeletal: Left chest wall external pacer in place. Prior median sternotomy. No acute bony abnormality. Review of the MIP images confirms the above findings. 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 Surrounding soft tissue near the aortic valve and aortic root presumably thrombus. Stranding noted around the  ascending thoracic aorta. Scarring in the left lower lobe. Trace right pleural effusion. Critical Value/emergent results were called by telephone at the time of interpretation on 05/18/2019 at 4:24 pm to provider [facility] , who verbally acknowledged these results. Electronically Signed   By: Charlett Nose M.D.   On: 05/18/2019 16:34   ECHOCARDIOGRAM COMPLETE  Result Date: 05/11/2019    ECHOCARDIOGRAM REPORT   Patient Name:   CORBITT CLOKE Date of Exam: 05/11/2019 Medical Rec #:  [id number]        Height:       71.0 in Accession #:    [id number]       Weight:       185.0 lb Date of Birth:  [birth year]        BSA:          2.040 m Patient Age:    43 years         BP:           108/70 mmHg Patient Gender: M                HR:           105 bpm. Exam Location:  [facility] Procedure: 2D Echo, Color Doppler and Cardiac Doppler Indications:     Bacteremia 790.7  History:         Patient has no prior history of Echocardiogram examinations.                  Stroke.  Sonographer:     Cristela Blue RDCS (AE) Referring Phys:  [id number] Lurene Shadow Diagnosing Phys: Lorine Bears MD IMPRESSIONS  1. Left ventricular ejection fraction, by estimation, is 50 to 55%. The left ventricle has low normal function. The left ventricle has no regional wall motion abnormalities. The left ventricular internal cavity size was mildly dilated. Left ventricular diastolic parameters are indeterminate.  2. Right ventricular systolic function is normal. The right ventricular size is normal. There is normal pulmonary artery systolic pressure.  3. Right atrial size was mildly dilated.  4. The mitral valve is normal in structure. Trivial mitral valve regurgitation. No evidence of mitral stenosis.  5. The aortic valve is abnormal. Aortic valve regurgitation is trivial. No aortic stenosis is present. Aortic valve is not well visualized but suspect there is a bioprosthesis.  6. There is moderate to severe dilatation of the aortic root and of the ascending  aorta.  7. Rcommend a TEE if there is a concern for endocarditis. FINDINGS  Left Ventricle: Left ventricular ejection fraction, by estimation, is 50 to 55%. The left ventricle has low normal function. The left ventricle has no regional wall motion abnormalities. The left ventricular internal cavity size was mildly dilated. There is no left ventricular hypertrophy. Left ventricular diastolic parameters are indeterminate. Right Ventricle: The right ventricular size is normal. No increase in right ventricular wall thickness. Right ventricular systolic function is normal. There is normal pulmonary artery systolic pressure. The tricuspid regurgitant velocity is 1.81 m/s, and  with an assumed right atrial pressure of 10 mmHg, the estimated right ventricular systolic pressure is 23.1 mmHg. Left Atrium: Left atrial size was normal in size. Right Atrium: Right atrial size was mildly dilated. Pericardium: There is no evidence of pericardial effusion. Mitral Valve: The mitral valve  is normal in structure. Normal mobility of the mitral valve leaflets. Trivial mitral valve regurgitation. No evidence of mitral valve stenosis. Tricuspid Valve: The tricuspid valve is normal in structure. Tricuspid valve regurgitation is trivial. No evidence of tricuspid stenosis. Aortic Valve: The aortic valve is abnormal. . There is moderate thickening of the aortic valve. Aortic valve regurgitation is trivial. No aortic stenosis is present. There is moderate thickening of the aortic valve. Aortic valve mean gradient measures 5.7 mmHg. Aortic valve peak gradient measures 9.5 mmHg. Aortic valve area, by VTI measures 1.93 cm. Pulmonic Valve: The pulmonic valve was normal in structure. Pulmonic valve regurgitation is not visualized. No evidence of pulmonic stenosis. Aorta: The aortic root is normal in size and structure. 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 Venous: The inferior vena cava was not well  visualized. IAS/Shunts: No atrial level shunt detected by color flow Doppler.  LEFT VENTRICLE PLAX 2D LVIDd:         5.73 cm  Diastology LVIDs:         3.71 cm  LV e' lateral:   16.30 cm/s LV PW:         1.40 cm  LV E/e' lateral: 6.1 LV IVS:        0.85 cm  LV e' medial:    9.68 cm/s LVOT diam:     2.00 cm  LV E/e' medial:  10.3 LV SV:         52 LV SV Index:   25 LVOT Area:     3.14 cm  RIGHT VENTRICLE RV Basal diam:  4.24 cm RV S prime:     7.18 cm/s TAPSE (M-mode): 3.6 cm LEFT ATRIUM             Index       RIGHT ATRIUM           Index LA diam:        3.90 cm 1.91 cm/m  RA Area:     22.40 cm LA Vol (A2C):   78.7 ml 38.58 ml/m RA Volume:   74.60 ml  36.57 ml/m LA Vol (A4C):   37.8 ml 18.53 ml/m LA Biplane Vol: 57.0 ml 27.94 ml/m  AORTIC VALVE                    PULMONIC VALVE AV Area (Vmax):    1.66 cm     PV Vmax:        0.79 m/s AV Area (Vmean):   1.65 cm     PV Peak grad:   2.5 mmHg AV Area (VTI):     1.93 cm     RVOT Peak grad: 3 mmHg AV Vmax:           154.00 cm/s AV Vmean:          106.667 cm/s AV VTI:            0.267 m AV Peak Grad:      9.5 mmHg AV Mean Grad:      5.7 mmHg LVOT Vmax:         81.30 cm/s LVOT Vmean:        56.000 cm/s LVOT VTI:          0.164 m LVOT/AV VTI ratio: 0.61  AORTA Ao Root diam: 4.75 cm MITRAL VALVE               TRICUSPID VALVE MV Area (PHT): 4.10  cm    TR Peak grad:   13.1 mmHg MV Decel Time: 185 msec    TR Vmax:        181.00 cm/s MV E velocity: 99.90 cm/s MV A velocity: 65.00 cm/s  SHUNTS MV E/A ratio:  1.54        Systemic VTI:  0.16 m                            Systemic Diam: 2.00 cm Lorine Bears MD Electronically signed by Lorine Bears MD Signature Date/Time: 05/11/2019/2:49:53 PM    Final    ECHO TEE  Result Date: 05/13/2019    TRANSESOPHOGEAL ECHO REPORT   Patient Name:   KAVION MANCINAS Date of Exam: 05/13/2019 Medical Rec #:  [id number]        Height:       71.0 in Accession #:    [id number]       Weight:       185.0 lb Date of Birth:  1975/08/18         BSA:          2.040 m Patient Age:    43 years         BP:           97/69 mmHg Patient Gender: M                HR:           95 bpm. Exam Location:  [facility] Procedure: Transesophageal Echo, Cardiac Doppler and Color Doppler Indications:     Not listed  History:         Patient has prior history of Echocardiogram examinations, most                  recent 05/11/2019. Stroke.  Sonographer:     Cristela Blue RDCS (AE) Referring Phys:  Lajuana Carry A KHAN Diagnosing Phys: Adrian Blackwater MD PROCEDURE: The transesophogeal probe was passed without difficulty through the esophogus of the patient. Sedation performed by performing physician. The patient's vital signs; including heart rate, blood pressure, and oxygen saturation; remained stable throughout the procedure. The patient developed no complications during the procedure. IMPRESSIONS  1. Left ventricular ejection fraction, by estimation, is 60 to 65%. The left ventricle has normal function. The left ventricle has no regional wall motion abnormalities.  2. Right ventricular systolic function is normal. The right ventricular size is normal.  3. No left atrial/left atrial appendage thrombus was detected.  4. The mitral valve is normal in structure. No evidence of mitral valve regurgitation. No evidence of mitral stenosis.  5. 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 The aortic valve is tricuspid. Aortic valve regurgitation is mild to moderate. No aortic stenosis is present.  6. The inferior vena cava is normal in size with greater than 50% respiratory variability, suggesting right atrial pressure of 3 mmHg. Conclusion(s)/Recommendation(s): Findings are concerning for vegetation/infective endocarditis as detailed above. 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 FINDINGS  Left Ventricle: Left ventricular ejection fraction, by estimation, is 60 to 65%. The left ventricle has normal function. The left ventricle has no regional wall motion abnormalities. The left  ventricular internal cavity size was normal in size. There is  no left ventricular hypertrophy. Right Ventricle: The right ventricular size is normal. No increase in right ventricular wall thickness. Right ventricular systolic function is normal. Left Atrium: Left atrial size was normal in size. No  left atrial/left atrial appendage thrombus was detected. Right Atrium: Right atrial size was normal in size. Pericardium: There is no evidence of pericardial effusion. Mitral Valve: The mitral valve is normal in structure. Normal mobility of the mitral valve leaflets. No evidence of mitral valve regurgitation. No evidence of mitral valve stenosis. Tricuspid Valve: The tricuspid valve is normal in structure. Tricuspid valve regurgitation is not demonstrated. No evidence of tricuspid stenosis. 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 The aortic valve is tricuspid. Aortic valve regurgitation is mild to moderate. No aortic stenosis is present. Pulmonic Valve: The pulmonic valve was normal in structure. Pulmonic valve regurgitation is not visualized. No evidence of pulmonic stenosis. Aorta: The aortic root is normal in size and structure. Venous: The inferior vena cava is normal in size with greater than 50% respiratory variability, suggesting right atrial pressure of 3 mmHg. IAS/Shunts: No atrial level shunt detected by color flow Doppler. Adrian Blackwater MD Electronically signed by Adrian Blackwater MD Signature Date/Time: 05/13/2019/9:02:59 AM    Final        Subjective: Patient feeling well, no acute complaints.  No acute events.  See today's progess note for subjective for tdoay.   Discharge Exam: Vitals:   05/18/19 1205 05/18/19 1959  BP: 102/65 102/62  Pulse: 91 96  Resp:  20  Temp: 98.6 F (37 C) 98.3 F (36.8 C)  SpO2: 99% 98%   Vitals:   05/17/19 1931 05/18/19 0433 05/18/19 1205 05/18/19 1959  BP: 101/67 99/64 102/65 102/62  Pulse: 81 82 91 96  Resp: 16 17  20   Temp: 98.5 F  (36.9 C) 98.3 F (36.8 C) 98.6 F (37 C) 98.3 F (36.8 C)  TempSrc: Oral Oral Oral Oral  SpO2: 99% 99% 99% 98%  Weight:      Height:        General: Pt is alert, awake, not in acute distress Cardiovascular: RRR, S1/S2 +, systolic murmur Respiratory: CTA bilaterally, no wheezing, no rhonchi Abdominal: Soft, NT, ND, bowel sounds + Extremities: no edema, no cyanosis    The results of significant diagnostics from this hospitalization (including imaging, microbiology, ancillary and laboratory) are listed below for reference.     Microbiology: Recent Results (from the past 240 hour(s))  Culture, blood (routine x 2)     Status: Abnormal   Collection Time: 05/09/19  1:10 PM   Specimen: BLOOD  Result Value Ref Range Status   Specimen Description   Final    BLOOD RFA Performed at [facility], [address] [place], [place]    Special Requests   Final    BOTTLES DRAWN AEROBIC AND ANAEROBIC Blood Culture results may not be optimal due to an excessive volume of blood received in culture bottles Performed at [facility], [address] [place], [place]    Culture  Setup Time   Final    IN BOTH AEROBIC AND ANAEROBIC BOTTLES GRAM POSITIVE COCCI CRITICAL RESULT CALLED TO, READ BACK BY AND VERIFIED WITH: SCOTT HALL ON 05/10/19 AT 0052 [facility] Performed at [facility], [address], [place], [place]    Culture STAPHYLOCOCCUS AUREUS (A)  Final   Report Status 05/12/2019 FINAL  Final   Organism ID, Bacteria STAPHYLOCOCCUS AUREUS  Final      Susceptibility   Staphylococcus aureus - MIC*    CIPROFLOXACIN >=8 RESISTANT Resistant     ERYTHROMYCIN >=8 RESISTANT Resistant     GENTAMICIN <=0.5 SENSITIVE  Sensitive     OXACILLIN <=0.25 SENSITIVE Sensitive     TETRACYCLINE <=1 SENSITIVE Sensitive     VANCOMYCIN 1 SENSITIVE Sensitive     TRIMETH/SULFA <=10 SENSITIVE Sensitive     CLINDAMYCIN <=0.25 SENSITIVE Sensitive     RIFAMPIN  <=0.5 SENSITIVE Sensitive     Inducible Clindamycin NEGATIVE Sensitive     * STAPHYLOCOCCUS AUREUS  Blood Culture ID Panel (Reflexed)     Status: Abnormal   Collection Time: 05/09/19  1:10 PM  Result Value Ref Range Status   Enterococcus species NOT DETECTED NOT DETECTED Final   Listeria monocytogenes NOT DETECTED NOT DETECTED Final   Staphylococcus species DETECTED (A) NOT DETECTED Final    Comment: CRITICAL RESULT CALLED TO, READ BACK BY AND VERIFIED WITH: SCOTT HALL ON 05/10/19 AT 0052 [facility]    Staphylococcus aureus (BCID) DETECTED (A) NOT DETECTED Final    Comment: Methicillin (oxacillin) susceptible Staphylococcus aureus (MSSA). Preferred therapy is anti staphylococcal beta lactam antibiotic (Cefazolin or [name]), unless clinically contraindicated. CRITICAL RESULT CALLED TO, READ BACK BY AND VERIFIED WITH: SCOTT HALL ON 05/10/19 AT 0052 [facility]    Methicillin resistance NOT DETECTED NOT DETECTED Final   Streptococcus species NOT DETECTED NOT DETECTED Final   Streptococcus agalactiae NOT DETECTED NOT DETECTED Final   Streptococcus pneumoniae NOT DETECTED NOT DETECTED Final   Streptococcus pyogenes NOT DETECTED NOT DETECTED Final   Acinetobacter baumannii NOT DETECTED NOT DETECTED Final   Enterobacteriaceae species NOT DETECTED NOT DETECTED Final   Enterobacter cloacae complex NOT DETECTED NOT DETECTED Final   Escherichia coli NOT DETECTED NOT DETECTED Final   Klebsiella oxytoca NOT DETECTED NOT DETECTED Final   Klebsiella pneumoniae NOT DETECTED NOT DETECTED Final   Proteus species NOT DETECTED NOT DETECTED Final   Serratia marcescens NOT DETECTED NOT DETECTED Final   Haemophilus influenzae NOT DETECTED NOT DETECTED Final   Neisseria meningitidis NOT DETECTED NOT DETECTED Final   Pseudomonas aeruginosa NOT DETECTED NOT DETECTED Final   Candida albicans NOT DETECTED NOT DETECTED Final   Candida glabrata NOT DETECTED NOT DETECTED Final   Candida krusei NOT DETECTED NOT DETECTED Final    Candida parapsilosis NOT DETECTED NOT DETECTED Final   Candida tropicalis NOT DETECTED NOT DETECTED Final    Comment: Performed at [facility], [address] [place], [place] [address]  Culture, blood (routine x 2)     Status: Abnormal   Collection Time: 05/09/19  1:52 PM   Specimen: BLOOD  Result Value Ref Range Status   Specimen Description   Final    BLOOD BLOOD LEFT FOREARM Performed at [facility], [address], [place], [place] [address]    Special Requests   Final    BOTTLES DRAWN AEROBIC AND ANAEROBIC Blood Culture adequate volume Performed at [facility], [address] [place], [place] [address]    Culture  Setup Time   Final    IN BOTH AEROBIC AND ANAEROBIC BOTTLES GRAM POSITIVE COCCI CRITICAL VALUE NOTED.  VALUE IS CONSISTENT WITH PREVIOUSLY REPORTED AND CALLED VALUE. Performed at [facility], [address] [place], [place] [address]    Culture (A)  Final    STAPHYLOCOCCUS AUREUS SUSCEPTIBILITIES PERFORMED ON PREVIOUS CULTURE WITHIN THE LAST 5 DAYS. Performed at [facility], [place] [address], [place], [place] [address]    Report Status 05/12/2019 FINAL  Final  Respiratory Panel by RT PCR (Flu A&B, Covid) - Nasopharyngeal Swab     Status: None   Collection Time:  05/09/19  3:11 PM   Specimen: Nasopharyngeal Swab  Result Value Ref Range Status   SARS Coronavirus 2 by RT PCR NEGATIVE NEGATIVE Final    Comment: (NOTE) SARS-CoV-2 target nucleic acids are NOT DETECTED. The SARS-CoV-2 RNA is generally detectable in upper respiratoy specimens during the acute phase of infection. The lowest concentration of SARS-CoV-2 viral copies this assay can detect is 131 copies/mL. A negative result does not preclude SARS-Cov-2 infection and should not be used as the sole basis for treatment or other patient management decisions. A negative result may occur with  improper specimen collection/handling, submission of specimen  other than nasopharyngeal swab, presence of viral mutation(s) within the areas targeted by this assay, and inadequate number of viral copies (<131 copies/mL). A negative result must be combined with clinical observations, patient history, and epidemiological information. The expected result is Negative. Fact Sheet for Patients:  https://www.moore.com/ Fact Sheet for Healthcare Providers:  https://www.young.biz/ This test is not yet ap proved or cleared by the [place] FDA and  has been authorized for detection and/or diagnosis of SARS-CoV-2 by FDA under an Emergency Use Authorization (EUA). This EUA will remain  in effect (meaning this test can be used) for the duration of the COVID-19 declaration under Section 564(b)(1) of the Act, 21 U.S.C. section 360bbb-3(b)(1), unless the authorization is terminated or revoked sooner.    Influenza A by PCR NEGATIVE NEGATIVE Final   Influenza B by PCR NEGATIVE NEGATIVE Final    Comment: (NOTE) The Xpert Xpress SARS-CoV-2/FLU/RSV assay is intended as an aid in  the diagnosis of influenza from Nasopharyngeal swab specimens and  should not be used as a sole basis for treatment. Nasal washings and  aspirates are unacceptable for Xpert Xpress SARS-CoV-2/FLU/RSV  testing. Fact Sheet for Patients: https://www.moore.com/ Fact Sheet for Healthcare Providers: https://www.young.biz/ This test is not yet approved or cleared by the [place] FDA and  has been authorized for detection and/or diagnosis of SARS-CoV-2 by  FDA under an Emergency Use Authorization (EUA). This EUA will remain  in effect (meaning this test can be used) for the duration of the  Covid-19 declaration under Section 564(b)(1) of the Act, 21  U.S.C. section 360bbb-3(b)(1), unless the authorization is  terminated or revoked. Performed at [facility] [address] [place], [place]  [address]   CULTURE, BLOOD (ROUTINE X 2) w Reflex to ID Panel     Status: None   Collection Time: 05/12/19 12:38 AM   Specimen: BLOOD  Result Value Ref Range Status   Specimen Description BLOOD RAC  Final   Special Requests BOTTLES DRAWN AEROBIC AND ANAEROBIC BCAV  Final   Culture   Final    NO GROWTH 5 DAYS Performed at [facility], [address] [place], [place] [address]    Report Status 05/17/2019 FINAL  Final  CULTURE, BLOOD (ROUTINE X 2) w Reflex to ID Panel     Status: None   Collection Time: 05/12/19 12:46 AM   Specimen: BLOOD  Result Value Ref Range Status   Specimen Description BLOOD LAC  Final   Special Requests BOTTLES DRAWN AEROBIC AND ANAEROBIC BCAV  Final   Culture   Final    NO GROWTH 5 DAYS Performed at [facility], [address], [place], [place] [address]    Report Status 05/17/2019 FINAL  Final     Labs: BNP (last 3 results) No results for input(s): BNP in the last 8760 hours. Basic Metabolic Panel: Recent Labs  Lab 05/14/19 0525 05/15/19 0412  NA 135 136  K 4.0 3.9  CL 105 105  CO2 22 21*  GLUCOSE 107* 107*  BUN 12 10  CREATININE 1.13 1.08  CALCIUM 8.9 8.8*   Liver Function Tests: Recent Labs  Lab 05/14/19 0525 05/15/19 0412  AST 73* 54*  ALT 94* 76*  ALKPHOS 128* 120  BILITOT 0.6 0.9  PROT 8.2* 7.9  ALBUMIN 3.4* 3.3*   No results for input(s): LIPASE, AMYLASE in the last 168 hours. No results for input(s): AMMONIA in the last 168 hours. CBC: Recent Labs  Lab 05/13/19 0554 05/15/19 0412  WBC 5.9 9.7  NEUTROABS 3.7 6.6  HGB 12.6* 12.3*  HCT 39.3 39.0  MCV 89 90.9  PLT 235 311   Cardiac Enzymes: No results for input(s): CKTOTAL, CKMB, CKMBINDEX, TROPONINI in the last 168 hours. BNP: Invalid input(s): POCBNP CBG: No results for input(s): GLUCAP in the last 168 hours. D-Dimer No results for input(s): DDIMER in the last 72 hours. Hgb A1c No results for input(s): HGBA1C in the last 72 hours. Lipid  Profile No results for input(s): CHOL, HDL, LDLCALC, TRIG, CHOLHDL, LDLDIRECT in the last 72 hours. Thyroid function studies No results for input(s): TSH, T4TOTAL, T3FREE, THYROIDAB in the last 72 hours.  Invalid input(s): FREET3 Anemia work up No results for input(s): VITAMINB12, FOLATE, FERRITIN, TIBC, IRON, RETICCTPCT in the last 72 hours. Urinalysis    Component Value Date/Time   COLORURINE YELLOW (A) 05/09/2019 1516   APPEARANCEUR CLEAR (A) 05/09/2019 1516   LABSPEC 1.009 05/09/2019 1516   PHURINE 7.0 05/09/2019 1516   GLUCOSEU NEGATIVE 05/09/2019 1516   HGBUR NEGATIVE 05/09/2019 1516   BILIRUBINUR NEGATIVE 05/09/2019 1516   KETONESUR NEGATIVE 05/09/2019 1516   PROTEINUR NEGATIVE 05/09/2019 1516   NITRITE NEGATIVE 05/09/2019 1516   LEUKOCYTESUR NEGATIVE 05/09/2019 1516   Sepsis Labs Invalid input(s): PROCALCITONIN,  WBC,  LACTICIDVEN Microbiology Recent Results (from the past 240 hour(s))  Culture, blood (routine x 2)     Status: Abnormal   Collection Time: 05/09/19  1:10 PM   Specimen: BLOOD  Result Value Ref Range Status   Specimen Description   Final    BLOOD RFA Performed at [facility], [address], [place], [place] [address]    Special Requests   Final    BOTTLES DRAWN AEROBIC AND ANAEROBIC Blood Culture results may not be optimal due to an excessive volume of blood received in culture bottles Performed at [facility], [address] [place], [place] [address]    Culture  Setup Time   Final    IN BOTH AEROBIC AND ANAEROBIC BOTTLES GRAM POSITIVE COCCI CRITICAL RESULT CALLED TO, READ BACK BY AND VERIFIED WITH: SCOTT HALL ON 05/10/19 AT 0052 [facility] Performed at [facility], [address], [place], [place] [address]    Culture STAPHYLOCOCCUS AUREUS (A)  Final   Report Status 05/12/2019 FINAL  Final   Organism ID, Bacteria STAPHYLOCOCCUS AUREUS  Final      Susceptibility   Staphylococcus aureus - MIC*    CIPROFLOXACIN >=8 RESISTANT  Resistant     ERYTHROMYCIN >=8 RESISTANT Resistant     GENTAMICIN <=0.5 SENSITIVE Sensitive     OXACILLIN <=0.25 SENSITIVE Sensitive     TETRACYCLINE <=1 SENSITIVE Sensitive     VANCOMYCIN 1 SENSITIVE Sensitive     TRIMETH/SULFA <=10 SENSITIVE Sensitive     CLINDAMYCIN <=0.25 SENSITIVE Sensitive     RIFAMPIN <=0.5 SENSITIVE Sensitive  Inducible Clindamycin NEGATIVE Sensitive     * STAPHYLOCOCCUS AUREUS  Blood Culture ID Panel (Reflexed)     Status: Abnormal   Collection Time: 05/09/19  1:10 PM  Result Value Ref Range Status   Enterococcus species NOT DETECTED NOT DETECTED Final   Listeria monocytogenes NOT DETECTED NOT DETECTED Final   Staphylococcus species DETECTED (A) NOT DETECTED Final    Comment: CRITICAL RESULT CALLED TO, READ BACK BY AND VERIFIED WITH: SCOTT HALL ON 05/10/19 AT 0052 [facility]    Staphylococcus aureus (BCID) DETECTED (A) NOT DETECTED Final    Comment: Methicillin (oxacillin) susceptible Staphylococcus aureus (MSSA). Preferred therapy is anti staphylococcal beta lactam antibiotic (Cefazolin or [name]), unless clinically contraindicated. CRITICAL RESULT CALLED TO, READ BACK BY AND VERIFIED WITH: SCOTT HALL ON 05/10/19 AT 0052 [facility]    Methicillin resistance NOT DETECTED NOT DETECTED Final   Streptococcus species NOT DETECTED NOT DETECTED Final   Streptococcus agalactiae NOT DETECTED NOT DETECTED Final   Streptococcus pneumoniae NOT DETECTED NOT DETECTED Final   Streptococcus pyogenes NOT DETECTED NOT DETECTED Final   Acinetobacter baumannii NOT DETECTED NOT DETECTED Final   Enterobacteriaceae species NOT DETECTED NOT DETECTED Final   Enterobacter cloacae complex NOT DETECTED NOT DETECTED Final   Escherichia coli NOT DETECTED NOT DETECTED Final   Klebsiella oxytoca NOT DETECTED NOT DETECTED Final   Klebsiella pneumoniae NOT DETECTED NOT DETECTED Final   Proteus species NOT DETECTED NOT DETECTED Final   Serratia marcescens NOT DETECTED NOT DETECTED Final    Haemophilus influenzae NOT DETECTED NOT DETECTED Final   Neisseria meningitidis NOT DETECTED NOT DETECTED Final   Pseudomonas aeruginosa NOT DETECTED NOT DETECTED Final   Candida albicans NOT DETECTED NOT DETECTED Final   Candida glabrata NOT DETECTED NOT DETECTED Final   Candida krusei NOT DETECTED NOT DETECTED Final   Candida parapsilosis NOT DETECTED NOT DETECTED Final   Candida tropicalis NOT DETECTED NOT DETECTED Final    Comment: Performed at [facility], [address] [place], [place] [address]  Culture, blood (routine x 2)     Status: Abnormal   Collection Time: 05/09/19  1:52 PM   Specimen: BLOOD  Result Value Ref Range Status   Specimen Description   Final    BLOOD BLOOD LEFT FOREARM Performed at [facility], [address] [place], [place] [address]    Special Requests   Final    BOTTLES DRAWN AEROBIC AND ANAEROBIC Blood Culture adequate volume Performed at [facility], [address] [place], [place] [address]    Culture  Setup Time   Final    IN BOTH AEROBIC AND ANAEROBIC BOTTLES GRAM POSITIVE COCCI CRITICAL VALUE NOTED.  VALUE IS CONSISTENT WITH PREVIOUSLY REPORTED AND CALLED VALUE. Performed at [facility], [address] [place], [place] [address]    Culture (A)  Final    STAPHYLOCOCCUS AUREUS SUSCEPTIBILITIES PERFORMED ON PREVIOUS CULTURE WITHIN THE LAST 5 DAYS. Performed at [facility], [address] [place], [place] [address]    Report Status 05/12/2019 FINAL  Final  Respiratory Panel by RT PCR (Flu A&B, Covid) - Nasopharyngeal Swab     Status: None   Collection Time: 05/09/19  3:11 PM   Specimen: Nasopharyngeal Swab  Result Value Ref Range Status   SARS Coronavirus 2 by RT PCR NEGATIVE NEGATIVE Final    Comment: (NOTE) SARS-CoV-2 target nucleic acids are NOT DETECTED. The SARS-CoV-2 RNA is generally detectable in upper respiratoy specimens during the acute phase of infection.  The  lowest concentration of SARS-CoV-2 viral copies this assay can detect is 131 copies/mL. A negative result does not preclude SARS-Cov-2 infection and should not be used as the sole basis for treatment or other patient management decisions. A negative result may occur with  improper specimen collection/handling, submission of specimen other than nasopharyngeal swab, presence of viral mutation(s) within the areas targeted by this assay, and inadequate number of viral copies (<131 copies/mL). A negative result must be combined with clinical observations, patient history, and epidemiological information. The expected result is Negative. Fact Sheet for Patients:  https://www.moore.com/ Fact Sheet for Healthcare Providers:  https://www.young.biz/ This test is not yet ap proved or cleared by the [place] FDA and  has been authorized for detection and/or diagnosis of SARS-CoV-2 by FDA under an Emergency Use Authorization (EUA). This EUA will remain  in effect (meaning this test can be used) for the duration of the COVID-19 declaration under Section 564(b)(1) of the Act, 21 U.S.C. section 360bbb-3(b)(1), unless the authorization is terminated or revoked sooner.    Influenza A by PCR NEGATIVE NEGATIVE Final   Influenza B by PCR NEGATIVE NEGATIVE Final    Comment: (NOTE) The Xpert Xpress SARS-CoV-2/FLU/RSV assay is intended as an aid in  the diagnosis of influenza from Nasopharyngeal swab specimens and  should not be used as a sole basis for treatment. Nasal washings and  aspirates are unacceptable for Xpert Xpress SARS-CoV-2/FLU/RSV  testing. Fact Sheet for Patients: https://www.moore.com/ Fact Sheet for Healthcare Providers: https://www.young.biz/ This test is not yet approved or cleared by the [place] FDA and  has been authorized for detection and/or diagnosis of SARS-CoV-2 by  FDA under an Emergency  Use Authorization (EUA). This EUA will remain  in effect (meaning this test can be used) for the duration of the  Covid-19 declaration under Section 564(b)(1) of the Act, 21  U.S.C. section 360bbb-3(b)(1), unless the authorization is  terminated or revoked. Performed at [facility], [address] [place], [place] [address]   CULTURE, BLOOD (ROUTINE X 2) w Reflex to ID Panel     Status: None   Collection Time: 05/12/19 12:38 AM   Specimen: BLOOD  Result Value Ref Range Status   Specimen Description BLOOD RAC  Final   Special Requests BOTTLES DRAWN AEROBIC AND ANAEROBIC BCAV  Final   Culture   Final    NO GROWTH 5 DAYS Performed at [facility], [address] [place], [place] [address]    Report Status 05/17/2019 FINAL  Final  CULTURE, BLOOD (ROUTINE X 2) w Reflex to ID Panel     Status: None   Collection Time: 05/12/19 12:46 AM   Specimen: BLOOD  Result Value Ref Range Status   Specimen Description BLOOD LAC  Final   Special Requests BOTTLES DRAWN AEROBIC AND ANAEROBIC BCAV  Final   Culture   Final    NO GROWTH 5 DAYS Performed at [facility], [address], [place], [place] [address]    Report Status 05/17/2019 FINAL  Final     Time coordinating discharge: Over 30 minutes  SIGNED:   Pennie Banter, DO [facility] 05/18/2019, 8:11 PM   If 7PM-7AM, please contact night-coverage [email or web address]

## 2019-05-18 NOTE — Progress Notes (Signed)
Date of Admission:  05/09/2019     MSSA bacteremia Aortic valve Endocarditis HIV  Subjective: Pt waiting transfer to [facility] Records received from HF Admitted to [facility] on 03/17/18 with altered mental status. Found to be in septic shock, developed CHB, and a new LBBB. Cardiology took him to cath lab for TVP. Bedside echo showed EF 60% and bicuspid aortic valve with moderate AI. An itraprocedural TEE showed severe eccenteric AI and aortic valve vegetation. 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 Blood cultures positive for strep pneumo ( pneumococcus). 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 Pt was intubated for a short period of thime- was also on pressors. On 03/20/18 he was taken for surgery once repeat blood  culture was negative. Post surgery he had developed infarcts in bilateral perimedian frontal lobes and was treated for 2 weeks with IV vanco and ceftriaxone meningitic dose. Then ceftriaxone was reduced to QD endocarditis dose and he completed long term IV antibiotic at [facility] where he was discharged to on 04/04/18. HE completed IV ceftriaxone on 04/29/18    Pt moved to [place] after his discharge from rehab Admitted to [facility] on 4/17 with feeling weak  MSSA in blood culture 4/4  Medications:  . aspirin EC  81 mg Oral Daily  . atorvastatin  40 mg Oral QHS  . enoxaparin (LOVENOX) injection  40 mg Subcutaneous Q24H    Objective: Vital signs in last 24 hours: Temp:  [98.3 F (36.8 C)-98.6 F (37 C)] 98.6 F (37 C) (04/26 1205) Pulse Rate:   [81-91] 91 (04/26 1205) Resp:  [16-17] 17 (04/26 0433) BP: (99-102)/(64-67) 102/65 (04/26 1205) SpO2:  [99 %] 99 % (04/26 1205)  PHYSICAL EXAM:  General: Alert, cooperative, no distress, appears stated age.  Head: Normocephalic, without obvious abnormality, atraumatic. Eyes: Conjunctivae clear, anicteric sclerae. Pupils are equal ENT Nares normal. No drainage or sinus tenderness. Lips, mucosa, and tongue normal. No Thrush Neck: Supple, symmetrical, no adenopathy, thyroid: non tender no carotid bruit and no JVD. Back: No CVA tenderness. Lungs: Clear to auscultation bilaterally. No Wheezing or Rhonchi. No rales. Heart: s1s2-systolic murmur Pacemaker site okay Abdomen: Soft, non-tender,not distended. Bowel sounds normal. No masses Extremities: atraumatic, no cyanosis. No edema. No clubbing Skin: perianal warts Lymph: Cervical, supraclavicular normal. Neurologic: Grossly non-focal  Microbiology: 4/17 -- 4/4 MSSA 4/20 BC- NG  Studies/Results: CT ANGIO CHEST AORTA W/CM &/OR WO/CM  Result Date: 05/18/2019 CLINICAL DATA:  Preoperative planning. History of aortic valve agitation. EXAM: CT ANGIOGRAPHY CHEST WITH CONTRAST TECHNIQUE: Multidetector CT imaging of the chest was performed using the standard protocol during bolus administration of intravenous contrast. Multiplanar CT image reconstructions and MIPs were obtained to evaluate the vascular anatomy. CONTRAST:  2mL OMNIPAQUE IOHEXOL 350 MG/ML SOLN COMPARISON:  None. FINDINGS: Cardiovascular: Heart is normal size. Aorta normal caliber measuring maximally 3.6 cm at the sinuses of Valsalva. There is abnormal collection of contrast noted which extends from the aortic outflow tract just proximal to the anterior leaflet of the aortic valve. This is concerning for ruptured aortic outflow tract or pseudoaneurysm. This appears contained within area of  soft tissue measuring approximately 4.2 x 3.0 cm near the aortic valve. Stranding noted around the  ascending thoracic aorta. Mediastinum/Nodes: No mediastinal, hilar, or axillary adenopathy. Trachea and esophagus are unremarkable. Thyroid unremarkable. Lungs/Pleura: Scarring in the left lower lobe posteriorly. Trace right pleural effusion. Upper Abdomen: Imaging into the upper abdomen shows no acute findings. Musculoskeletal: Left chest wall external pacer in place. Prior median sternotomy. No acute bony abnormality. Review of the MIP images confirms the above findings. 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 Surrounding soft tissue near the aortic valve and aortic root presumably thrombus. Stranding noted around the ascending thoracic aorta. Scarring in the left lower lobe. Trace right pleural effusion. Critical Value/emergent results were called by telephone at the time of interpretation on 05/18/2019 at 4:24 pm to provider [facility] , who verbally acknowledged these results. Electronically Signed   By: Rolm Baptise M.D.   On: 05/18/2019 16:34     Assessment/Plan: Staph aureus bacteremia with aortic valve 2X2 cm vegetation CTA shows 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

## 2019-05-18 NOTE — Progress Notes (Signed)
PROGRESS NOTE    [name]   WIO:[id number]  DOB: October 15, 1975  PCP: Patient, No Pcp Per    DOA: 05/09/2019 LOS: 9   Brief Narrative   James Ferrell is an 44 y.o. malewith medical history significant for HIV infection, chronic low back pain, stroke (occuredduring heart valve surgery) and heart valve surgery (does not know which valve), status post pacemaker, who presented to the hospital with fever and chills, progressive generalized weakness and dizziness, with a near fall. He went to [facility] on the day of admission where his temperature was 103 F in the emergency department. Due to long waiting times in the emergency department at [facility], he came to [facility] for further evaluation.  In the ED here, tachycardic and hypotensive, given boluses of IV fluids.  He was initially diagnosed with sepsis of unknown etiology, treated empirically with IV cefepime and vancomycin.  Blood culture grew MSSA and antibiotics were deescalated to IV cefazolin.  ID was consulted.  2D echo did not show any evidence of endocarditis so cardiology was consulted for TEE which showed a 2x2 cm aortic valve vegetation.     Assessment & Plan   Principal Problem:   Sepsis (HCC) Active Problems:   Leukocytosis   Hypotension   AKI (acute kidney injury) (HCC)   Bacteremia due to Staphylococcus aureus   MSSA sepsis and bacteremia - sepsis present on admission.  Repeat cultures negative.  Suspect source is pacemaker. 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 With history of prior AV replacement, MV repair and additional complex CT surgery for prior endocarditis (Feb 2020). 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 Teressa Senter, Dr. Jules Schick [facility] - given prior complex cardiac surgery, patient would be best [facility] at tertiary [facility].   --Outside records from prior surgery at [facility] in [place] finally arrived, today will be faxed to on call CT surgeon at [facility] (Dr. Laneta Simmers) for review and consideration for surgery, if patient is deemed a candidate. --Case discussed with cardiology at [facility], patient accepted in transfer and awaiting a bed --Continue IV Ancef --CTA Chest aorta today for surgical planning, rads push to [facility] when done  Chronic systolic CHF - euvolemic, compensated.  EF 50-55% on TTE on 05/11/19. Metoprolol on hold due to borderline BP's.  Situational Depression - pt denies depression history and reports was doing emotionally and psychologically well prior to his current illness.  Monitor closely.  Consider psych consult, medication if progressing.    Acute kidney injury, hypotension and hyponatremia: Resolved.   Mildly elevated liver enzymes: Hepatitis panel is negative  History of embolic stroke, likely septic emboli in setting of prior bacteremia/endocarditis. Previously on aspirin and Lipitor but stopped taking them months ago (October or November 2020).   --resumed on ASA, Lipitor   HIV infection: HIV RNA is 10,500.  Patient said he takes Atripla at home. 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  Patient said he would have visitor bring his topical medication from home.  No acute issues.  Permanent pacemaker status, history of heart valve surgery.  Order has been placed to obtain records from [facility] in [place].   Previous home medications: His significant other brought some medications that were filled in June 2020.  She said patient used to take them but he  stopped taking them months ago.  These were low-dose aspirin, metoprolol, Lipitor, naproxen and cyclobenzaprine (chronic low back pain).  Patient BMI: Body mass index is 25.8 kg/m.   DVT prophylaxis: heparin  Diet:  Diet Orders (From admission, onward)    Start      Ordered   05/13/19 0945  Diet regular Room service appropriate? Yes; Fluid consistency: Thin  Diet effective now    Question Answer Comment  Room service appropriate? Yes   Fluid consistency: Thin      05/13/19 0944            Code Status: Full Code    Subjective 05/18/19    Patient seen with girlfriend at bedside today.  We discussed transfer to [facility] for surgery and he is agreeable.  He reports feeling well physically.  He is tearful and scared about his heart and what's is coming up.  Denies depression prior to this current illness and hospital admission.    Disposition Plan & Communication   Dispo & Barriers: Transfer to [facility] pending available bed.  He is accepted and on wait list. Patient continues to require inpatient care for IV antibiotics for bacteremia with TEE-confirmed endocarditis. Coming from: home Exp d/c date: TBD Medically stable for d/c? no  Family Communication: None at bedside during encounter, patient to update   Consults, Procedures, Significant Events   Consultants:   Infectious Disease  Cardiology  Cardiothoracic surgery  Procedures:   2D Echo 4/19 EF 50-55%  TEE  4/21 2x2cm AV vegetation  Antimicrobials:   Ancef 4/18 >> continue   Objective   Vitals:   05/17/19 0448 05/17/19 1255 05/17/19 1931 05/18/19 0433  BP: 115/73 108/74 101/67 99/64  Pulse: 92 91 81 82  Resp: 20 16 16 17   Temp: 97.6 F (36.4 C) 98.6 F (37 C) 98.5 F (36.9 C) 98.3 F (36.8 C)  TempSrc: Oral Oral Oral Oral  SpO2: 99% 99% 99% 99%  Weight:      Height:        Intake/Output Summary (Last 24 hours) at 05/18/2019 0725 Last data filed at 05/18/2019 0547 Gross per 24 hour  Intake 1686.79 ml  Output 800 ml  Net 886.79 ml   Filed Weights   05/09/19 1305 05/13/19 0740  Weight: 83.9 kg 83.9 kg    Physical Exam:  General exam: awake, alert, no acute distress Respiratory system: CTAB, normal respiratory effort, no wheezing or rhonchi. Cardiovascular  system: normal S1/S2, RRR, 2/6 systolic murmur sounds stable, no pedal edema.   Central nervous system: A&O x4. no gross focal neurologic deficits, normal speech Psychiatry: depressed mood, congruent affect, judgement and insight appear normal  Labs   Data Reviewed: I have personally reviewed following labs and imaging studies  CBC: Recent Labs  Lab 05/13/19 0554 05/15/19 0412  WBC 5.9 9.7  NEUTROABS 3.7 6.6  HGB 12.6* 12.3*  HCT 39.3 39.0  MCV 89 90.9  PLT 235 311   Basic Metabolic Panel: Recent Labs  Lab 05/14/19 0525 05/15/19 0412  NA 135 136  K 4.0 3.9  CL 105 105  CO2 22 21*  GLUCOSE 107* 107*  BUN 12 10  CREATININE 1.13 1.08  CALCIUM 8.9 8.8*   GFR: Estimated Creatinine Clearance: 95.9 mL/min (by C-G formula based on SCr of 1.08 mg/dL). Liver Function Tests: Recent Labs  Lab 05/14/19 0525 05/15/19 0412  AST 73* 54*  ALT 94* 76*  ALKPHOS 128* 120  BILITOT 0.6 0.9  PROT 8.2* 7.9  ALBUMIN  3.4* 3.3*   No results for input(s): LIPASE, AMYLASE in the last 168 hours. No results for input(s): AMMONIA in the last 168 hours. Coagulation Profile: No results for input(s): INR, PROTIME in the last 168 hours. Cardiac Enzymes: No results for input(s): CKTOTAL, CKMB, CKMBINDEX, TROPONINI in the last 168 hours. BNP (last 3 results) No results for input(s): PROBNP in the last 8760 hours. HbA1C: No results for input(s): HGBA1C in the last 72 hours. CBG: No results for input(s): GLUCAP in the last 168 hours. Lipid Profile: No results for input(s): CHOL, HDL, LDLCALC, TRIG, CHOLHDL, LDLDIRECT in the last 72 hours. Thyroid Function Tests: No results for input(s): TSH, T4TOTAL, FREET4, T3FREE, THYROIDAB in the last 72 hours. Anemia Panel: No results for input(s): VITAMINB12, FOLATE, FERRITIN, TIBC, IRON, RETICCTPCT in the last 72 hours. Sepsis Labs: No results for input(s): PROCALCITON, LATICACIDVEN in the last 168 hours.  Recent Results (from the past 240 hour(s))    Culture, blood (routine x 2)     Status: Abnormal   Collection Time: 05/09/19  1:10 PM   Specimen: BLOOD  Result Value Ref Range Status   Specimen Description   Final    BLOOD RFA Performed at [facility], [address] [place], [place] [address]    Special Requests   Final    BOTTLES DRAWN AEROBIC AND ANAEROBIC Blood Culture results may not be optimal due to an excessive volume of blood received in culture bottles Performed at [facility], [address] [place], [place] [address]    Culture  Setup Time   Final    IN BOTH AEROBIC AND ANAEROBIC BOTTLES GRAM POSITIVE COCCI CRITICAL RESULT CALLED TO, READ BACK BY AND VERIFIED WITH: SCOTT HALL ON 05/10/19 AT 0052 [facility] Performed at [facility], [address], [place], [place] [address]    Culture STAPHYLOCOCCUS AUREUS (A)  Final   Report Status 05/12/2019 FINAL  Final   Organism ID, Bacteria STAPHYLOCOCCUS AUREUS  Final      Susceptibility   Staphylococcus aureus - MIC*    CIPROFLOXACIN >=8 RESISTANT Resistant     ERYTHROMYCIN >=8 RESISTANT Resistant     GENTAMICIN <=0.5 SENSITIVE Sensitive     OXACILLIN <=0.25 SENSITIVE Sensitive     TETRACYCLINE <=1 SENSITIVE Sensitive     VANCOMYCIN 1 SENSITIVE Sensitive     TRIMETH/SULFA <=10 SENSITIVE Sensitive     CLINDAMYCIN <=0.25 SENSITIVE Sensitive     RIFAMPIN <=0.5 SENSITIVE Sensitive     Inducible Clindamycin NEGATIVE Sensitive     * STAPHYLOCOCCUS AUREUS  Blood Culture ID Panel (Reflexed)     Status: Abnormal   Collection Time: 05/09/19  1:10 PM  Result Value Ref Range Status   Enterococcus species NOT DETECTED NOT DETECTED Final   Listeria monocytogenes NOT DETECTED NOT DETECTED Final   Staphylococcus species DETECTED (A) NOT DETECTED Final    Comment: CRITICAL RESULT CALLED TO, READ BACK BY AND VERIFIED WITH: SCOTT HALL ON 05/10/19 AT 0052 [facility]    Staphylococcus aureus (BCID) DETECTED (A) NOT DETECTED Final    Comment: Methicillin (oxacillin)  susceptible Staphylococcus aureus (MSSA). Preferred therapy is anti staphylococcal beta lactam antibiotic (Cefazolin or [name]), unless clinically contraindicated. CRITICAL RESULT CALLED TO, READ BACK BY AND VERIFIED WITH: SCOTT HALL ON 05/10/19 AT 0052 [facility]    Methicillin resistance NOT DETECTED NOT DETECTED Final   Streptococcus species NOT DETECTED NOT DETECTED Final   Streptococcus agalactiae NOT DETECTED NOT DETECTED Final   Streptococcus pneumoniae NOT DETECTED  NOT DETECTED Final   Streptococcus pyogenes NOT DETECTED NOT DETECTED Final   Acinetobacter baumannii NOT DETECTED NOT DETECTED Final   Enterobacteriaceae species NOT DETECTED NOT DETECTED Final   Enterobacter cloacae complex NOT DETECTED NOT DETECTED Final   Escherichia coli NOT DETECTED NOT DETECTED Final   Klebsiella oxytoca NOT DETECTED NOT DETECTED Final   Klebsiella pneumoniae NOT DETECTED NOT DETECTED Final   Proteus species NOT DETECTED NOT DETECTED Final   Serratia marcescens NOT DETECTED NOT DETECTED Final   Haemophilus influenzae NOT DETECTED NOT DETECTED Final   Neisseria meningitidis NOT DETECTED NOT DETECTED Final   Pseudomonas aeruginosa NOT DETECTED NOT DETECTED Final   Candida albicans NOT DETECTED NOT DETECTED Final   Candida glabrata NOT DETECTED NOT DETECTED Final   Candida krusei NOT DETECTED NOT DETECTED Final   Candida parapsilosis NOT DETECTED NOT DETECTED Final   Candida tropicalis NOT DETECTED NOT DETECTED Final    Comment: Performed at [facility], [address] [place], [place] [address]  Culture, blood (routine x 2)     Status: Abnormal   Collection Time: 05/09/19  1:52 PM   Specimen: BLOOD  Result Value Ref Range Status   Specimen Description   Final    BLOOD BLOOD LEFT FOREARM Performed at [facility], [address] [place], [place] [address]    Special Requests   Final    BOTTLES DRAWN AEROBIC AND ANAEROBIC Blood Culture adequate volume Performed at [facility], [address] [place], [place] [address]    Culture  Setup Time   Final    IN BOTH AEROBIC AND ANAEROBIC BOTTLES GRAM POSITIVE COCCI CRITICAL VALUE NOTED.  VALUE IS CONSISTENT WITH PREVIOUSLY REPORTED AND CALLED VALUE. Performed at [facility], [address] [place], [place] [address]    Culture (A)  Final    STAPHYLOCOCCUS AUREUS SUSCEPTIBILITIES PERFORMED ON PREVIOUS CULTURE WITHIN THE LAST 5 DAYS. Performed at [facility], [place] [address] [place], [place] [address]    Report Status 05/12/2019 FINAL  Final  Respiratory Panel by RT PCR (Flu A&B, Covid) - Nasopharyngeal Swab     Status: None   Collection Time: 05/09/19  3:11 PM   Specimen: Nasopharyngeal Swab  Result Value Ref Range Status   SARS Coronavirus 2 by RT PCR NEGATIVE NEGATIVE Final    Comment: (NOTE) SARS-CoV-2 target nucleic acids are NOT DETECTED. The SARS-CoV-2 RNA is generally detectable in upper respiratoy specimens during the acute phase of infection. The lowest concentration of SARS-CoV-2 viral copies this assay can detect is 131 copies/mL. A negative result does not preclude SARS-Cov-2 infection and should not be used as the sole basis for treatment or other patient management decisions. A negative result may occur with  improper specimen collection/handling, submission of specimen other than nasopharyngeal swab, presence of viral mutation(s) within the areas targeted by this assay, and inadequate number of viral copies (<131 copies/mL). A negative result must be combined with clinical observations, patient history, and epidemiological information. The expected result is Negative. Fact Sheet for Patients:  PinkCheek.be Fact Sheet for Healthcare Providers:  GravelBags.it This test is not yet ap proved or cleared by the [place] FDA and  has been authorized for detection and/or diagnosis of SARS-CoV-2 by FDA  under an Emergency Use Authorization (EUA). This EUA will remain  in effect (meaning this test can be used) for the duration of the COVID-19 declaration under Section 564(b)(1) of the Act, 21 U.S.C. section 360bbb-3(b)(1), unless  the authorization is terminated or revoked sooner.    Influenza A by PCR NEGATIVE NEGATIVE Final   Influenza B by PCR NEGATIVE NEGATIVE Final    Comment: (NOTE) The Xpert Xpress SARS-CoV-2/FLU/RSV assay is intended as an aid in  the diagnosis of influenza from Nasopharyngeal swab specimens and  should not be used as a sole basis for treatment. Nasal washings and  aspirates are unacceptable for Xpert Xpress SARS-CoV-2/FLU/RSV  testing. Fact Sheet for Patients: https://www.moore.com/https://www.fda.gov/media/142436/download Fact Sheet for Healthcare Providers: https://www.young.biz/https://www.fda.gov/media/142435/download This test is not yet approved or cleared by the [place] FDA and  has been authorized for detection and/or diagnosis of SARS-CoV-2 by  FDA under an Emergency Use Authorization (EUA). This EUA will remain  in effect (meaning this test can be used) for the duration of the  Covid-19 declaration under Section 564(b)(1) of the Act, 21  U.S.C. section 360bbb-3(b)(1), unless the authorization is  terminated or revoked. Performed at [facility], [address] [place], [place] [address]   CULTURE, BLOOD (ROUTINE X 2) w Reflex to ID Panel     Status: None   Collection Time: 05/12/19 12:38 AM   Specimen: BLOOD  Result Value Ref Range Status   Specimen Description BLOOD RAC  Final   Special Requests BOTTLES DRAWN AEROBIC AND ANAEROBIC BCAV  Final   Culture   Final    NO GROWTH 5 DAYS Performed at [facility], [address] [place], [place] [address]    Report Status 05/17/2019 FINAL  Final  CULTURE, BLOOD (ROUTINE X 2) w Reflex to ID Panel     Status: None   Collection Time: 05/12/19 12:46 AM   Specimen: BLOOD  Result Value Ref Range Status   Specimen  Description BLOOD LAC  Final   Special Requests BOTTLES DRAWN AEROBIC AND ANAEROBIC BCAV  Final   Culture   Final    NO GROWTH 5 DAYS Performed at [facility], [address] [place], [place] [address]    Report Status 05/17/2019 FINAL  Final      Imaging Studies   No results found.   Medications   Scheduled Meds: . aspirin EC  81 mg Oral Daily  . atorvastatin  40 mg Oral QHS  . enoxaparin (LOVENOX) injection  40 mg Subcutaneous Q24H  . podophyllum resin   Topical BID   Continuous Infusions: . sodium chloride Stopped (05/18/19 0240)  .  ceFAZolin (ANCEF) IV Stopped (05/18/19 0232)       LOS: 9 days   Total time spent:  53 minutes, more than 50% in coordinating care and counseling patient.   Pennie BanterKelly A Yosgar Demirjian, DO [facility]   If 7PM-7AM, please contact night-coverage [email or web address] 05/18/2019, 7:25 AM

## 2019-05-19 LAB — CBC WITH DIFFERENTIAL/PLATELET
Abs Immature Granulocytes: 0.26 10*3/uL — ABNORMAL HIGH (ref 0.00–0.07)
Basophils Absolute: 0.1 10*3/uL (ref 0.0–0.1)
Basophils Relative: 1 %
Eosinophils Absolute: 0.1 10*3/uL (ref 0.0–0.5)
Eosinophils Relative: 2 %
HCT: 37.7 % — ABNORMAL LOW (ref 39.0–52.0)
Hemoglobin: 12.1 g/dL — ABNORMAL LOW (ref 13.0–17.0)
Immature Granulocytes: 4 %
Lymphocytes Relative: 20 %
Lymphs Abs: 1.3 10*3/uL (ref 0.7–4.0)
MCH: 29.4 pg (ref 26.0–34.0)
MCHC: 32.1 g/dL (ref 30.0–36.0)
MCV: 91.5 fL (ref 80.0–100.0)
Monocytes Absolute: 0.7 10*3/uL (ref 0.1–1.0)
Monocytes Relative: 11 %
Neutro Abs: 4.2 10*3/uL (ref 1.7–7.7)
Neutrophils Relative %: 62 %
Platelets: 453 10*3/uL — ABNORMAL HIGH (ref 150–400)
RBC: 4.12 MIL/uL — ABNORMAL LOW (ref 4.22–5.81)
RDW: 12 % (ref 11.5–15.5)
WBC: 6.6 10*3/uL (ref 4.0–10.5)
nRBC: 0 % (ref 0.0–0.2)

## 2019-05-19 LAB — COMPREHENSIVE METABOLIC PANEL
ALT: 35 U/L (ref 0–44)
AST: 33 U/L (ref 15–41)
Albumin: 3 g/dL — ABNORMAL LOW (ref 3.5–5.0)
Alkaline Phosphatase: 106 U/L (ref 38–126)
Anion gap: 7 (ref 5–15)
BUN: 11 mg/dL (ref 6–20)
CO2: 26 mmol/L (ref 22–32)
Calcium: 8.6 mg/dL — ABNORMAL LOW (ref 8.9–10.3)
Chloride: 104 mmol/L (ref 98–111)
Creatinine, Ser: 0.97 mg/dL (ref 0.61–1.24)
GFR calc Af Amer: >60 mL/min (ref >60)
GFR calc non Af Amer: >60 mL/min (ref >60)
Glucose, Bld: 103 mg/dL — ABNORMAL HIGH (ref 70–99)
Potassium: 3.6 mmol/L (ref 3.5–5.1)
Sodium: 137 mmol/L (ref 135–145)
Total Bilirubin: 0.5 mg/dL (ref 0.3–1.2)
Total Protein: 7.5 g/dL (ref 6.5–8.1)

## 2019-05-19 MED ORDER — BICTEGRAVIR-EMTRICITAB-TENOFOV 50-200-25 MG PO TABS
1.0000 | ORAL_TABLET | Freq: Every day | ORAL | Status: DC
  Administered 2019-05-19 – 2019-05-24 (×6): 1 via ORAL
  Filled 2019-05-19 (×6): qty 1

## 2019-05-19 MED ORDER — BICTEGRAVIR-EMTRICITAB-TENOFOV 50-200-25 MG PO TABS
1.0000 | ORAL_TABLET | Freq: Every day | ORAL | Status: DC
  Filled 2019-05-19 (×2): qty 1

## 2019-05-19 NOTE — Progress Notes (Signed)
MSSA bacteremia Aortic valve Endocarditis HIV  Subjective: Pt waiting transfer to [facility] Records received from HF Admitted to [facility] on 03/17/18 with altered mental status. Found to be in septic shock, developed CHB, and a new LBBB. Cardiology took him to cath lab for TVP. Bedside echo showed EF 60% and bicuspid aortic valve with moderate AI. An itraprocedural TEE showed severe eccenteric AI and aortic valve vegetation. 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 Blood cultures positive for strep pneumo ( pneumococcus). 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 Pt was intubated for a short period of thime- was also on pressors. On 03/20/18 he was taken for surgery once repeat blood  culture was negative. Post surgery he had developed infarcts in bilateral perimedian frontal lobes and was treated for 2 weeks with IV vanco and ceftriaxone meningitic dose. Then ceftriaxone was reduced to QD endocarditis dose and he completed long term IV antibiotic at [facility] where he was discharged to on 04/04/18. HE completed IV ceftriaxone on 04/29/18    Pt moved to [place] after his discharge from rehab Admitted to [facility] on 4/17 with feeling weak    Patient Vitals for the past 24 hrs:  BP Temp Temp src Pulse Resp SpO2  05/19/19 1423 107/64 98.4 F (36.9 C) Oral 93 18 99 %  05/19/19 1155 109/71 98.3 F (36.8 C) Oral 82 20 99 %  05/19/19 0535 116/70 98.6 F (37 C) Oral 80 20 100 %  05/18/19 1959 102/62 98.3 F (36.8 C) Oral 96 20 98 %   Awake and alert No distress Chest  CTA HS s1s2 Pacemaker site- fine CNS -non focal   labs CBC Latest Ref Rng & Units 05/19/2019 05/15/2019 05/13/2019  WBC 4.0 - 10.5 K/uL 6.6 9.7 5.9  Hemoglobin 13.0 - 17.0 g/dL 12.1(L) 12.3(L) 12.6(L)  Hematocrit 39.0 - 52.0 % 37.7(L) 39.0 39.3  Platelets 150 - 400 K/uL 453(H) 311 235    CMP Latest Ref Rng & Units 05/19/2019 05/15/2019 05/14/2019  Glucose 70 - 99 mg/dL 025(E) 527(P) 824(M)  BUN 6 - 20 mg/dL 11 10 12   Creatinine 0.61 - 1.24 mg/dL 3.53 6.14  Sodium 135 - 145 mmol/L 137 136 135  Potassium 3.5 - 5.1 mmol/L 3.6 3.9 4.0  Chloride 98 - 111 mmol/L 104 105 105  CO2 22 - 32 mmol/L 26 21(L) 22  Calcium 8.9 - 10.3 mg/dL 4.31) 5.4(M) 8.9  Total Protein 6.5 - 8.1 g/dL 7.5 7.9 0.8(Q)  Total Bilirubin 0.3 - 1.2 mg/dL 0.5 0.9 0.6  Alkaline Phos 38 - 126 U/L 106 120 128(H)  AST 15 - 41 U/L 33 54(H) 73(H)  ALT 0 - 44 U/L 35 76(H) 94(H)   Microbiology: 4/17 -- 4/4 MSSA 4/20 BC- NG Impression/recommendation Staph aureus bacteremia with aortic valve 2X2 cm vegetation CTA shows 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 H/o strep pneumoniae bacteremia and endocarditis and meningitis H/o CVA- 2020  Perianal warts- HPV RPR neg  HIV- VL on 05/11/19 is 10,500 Cd4 is 326 (25%) from 05/13/19 Genosure prime sent - result pending Has been non compliant as he did not engage in care since moving to [place] ( but did go to [place] early this year and got some samples of symtuza from His provider Dr.PAul Benson's PA) As symtuza non formulary and also to avoid cobicistat will start him on  Biktarvy- ( bictegravir+TAF+FTC)  Pt is being transferred to [facility] for further management Discussed the management with the patient

## 2019-05-19 NOTE — Progress Notes (Signed)
PROGRESS NOTE    James Ferrell   LSL:[id number]  DOB: May 22, 1975  PCP: Patient, No Pcp Per    DOA: 05/09/2019 LOS: 10   Brief Narrative   James Ferrell is an 44 y.o. malewith medical history significant for HIV infection, chronic low back pain, stroke (occuredduring heart valve surgery) and heart valve surgery (does not know which valve), status post pacemaker, who presented to the hospital with fever and chills, progressive generalized weakness and dizziness, with a near fall. He went to [facility] on the day of admission where his temperature was 103 F in the emergency department. Due to long waiting times in the emergency department at [facility], he came to [facility] for further evaluation.  In the ED here, tachycardic and hypotensive, given boluses of IV fluids.  He was initially diagnosed with sepsis of unknown etiology, treated empirically with IV cefepime and vancomycin.  Blood culture grew MSSA and antibiotics were deescalated to IV cefazolin.  ID was consulted.  2D echo did not show any evidence of endocarditis so cardiology was consulted for TEE which showed a 2x2 cm aortic valve vegetation.     Assessment & Plan   Principal Problem:   Sepsis (HCC) Active Problems:   Leukocytosis   Hypotension   AKI (acute kidney injury) (HCC)   Bacteremia due to Staphylococcus aureus   MSSA sepsis and bacteremia - sepsis present on admission.  Repeat cultures negative.  Suspect source is pacemaker. 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 With history of prior AV replacement, MV repair and additional complex CT surgery for prior endocarditis (Feb 2020). 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 Teressa Senter, Dr. Jules Schick [facility] - given prior complex cardiac surgery, patient would be [facility].  --Outside records from prior surgery at [facility] in [place] finally arrived, today will be faxed to on call CT surgeon at [facility] (Dr. Laneta Simmers) for review and consideration for surgery, if patient is deemed a candidate. --Case discussed with cardiology at [facility], patient accepted in transfer and awaiting a bed --Continue IV Ancef --CTA Chest aorta today for surgical planning, rads push to [facility] when done  Chronic systolic CHF - euvolemic, compensated.  EF 50-55% on TTE on 05/11/19. Metoprolol on hold due to borderline BP's.  Situational Depression - pt denies depression history and reports was doing emotionally and psychologically well prior to his current illness.  Monitor closely.  Consider psych consult, medication if progressing.    Acute kidney injury, hypotension and hyponatremia: Resolved.   Mildly elevated liver enzymes: Hepatitis panel is negative  History of embolic stroke, likely septic emboli in setting of prior bacteremia/endocarditis. Previously on aspirin and Lipitor but stopped taking them months ago (October or November 2020).   --resumed on ASA, Lipitor   HIV infection: HIV RNA is 10,500.  Planned to resume Symtuza  (darunavir,+ cobi,+ TAF+, FTC), but not on formulary here.  Alternatively, patient started on Biktarvy (bictegravir-emtricitabine, tenofovir). 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  Patient said he would have visitor bring his topical medication from home.  No acute issues.  Permanent pacemaker status, history of heart valve surgery.  Order has been placed to obtain records from [facility] in [place].   Previous home medications: His significant other brought some medications that were filled  in June 2020.  She said patient used to take them but he stopped taking them months ago.  These were low-dose aspirin, metoprolol, Lipitor, naproxen and cyclobenzaprine (chronic low back pain).  Patient BMI:  Body mass index is 25.8 kg/m.   DVT prophylaxis: heparin  Diet:  Diet Orders (From admission, onward)    Start     Ordered   05/18/19 0000  Diet - low sodium heart healthy     05/18/19 2011   05/13/19 0945  Diet regular Room service appropriate? Yes; Fluid consistency: Thin  Diet effective now    Question Answer Comment  Room service appropriate? Yes   Fluid consistency: Thin      05/13/19 0944            Code Status: Full Code    Subjective 05/19/19    Patient seen this AM at bedside.  He reports feeling well, although scared about his heart and infection.  He reports noticing some dyspnea on exertion yesterday on way back from walking visitor to the elevator.  He otherwise denies symptoms including chest pain, palpitations, SOB at rest, fever or chills.    Disposition Plan & Communication   Dispo & Barriers: [facility].  He is accepted and on wait list. Patient continues to require inpatient care for IV antibiotics for bacteremia with TEE-confirmed endocarditis. Coming from: home Exp d/c date: TBD Medically stable for d/c? no  Family Communication: None at bedside during encounter, patient to update   Consults, Procedures, Significant Events   Consultants:   Infectious Disease  Cardiology  Cardiothoracic surgery  Procedures:   2D Echo 4/19 EF 50-55%  TEE  4/21 2x2cm AV vegetation  Antimicrobials:   Ancef 4/18 >> continue   Objective   Vitals:   05/18/19 1205 05/18/19 1959 05/19/19 0535 05/19/19 1155  BP: 102/65 102/62 116/70 109/71  Pulse: 91 96 80 82  Resp:  20 20 20   Temp: 98.6 F (37 C) 98.3 F (36.8 C) 98.6 F (37 C) 98.3 F (36.8 C)  TempSrc: Oral Oral Oral Oral  SpO2: 99% 98% 100% 99%  Weight:      Height:        Intake/Output Summary (Last 24 hours) at 05/19/2019 1305 Last data filed at 05/19/2019 0900 Gross per 24 hour  Intake 354.81 ml  Output 400 ml  Net -45.19 ml   Filed Weights   05/09/19 1305  05/13/19 0740  Weight: 83.9 kg 83.9 kg    Physical Exam:  General exam: awake, alert, no acute distress Respiratory system: CTAB, normal respiratory effort, no wheezing or rhonchi. Cardiovascular system: normal S1/S2, RRR, 2/6 systolic murmur remainsstable, no pedal edema.   Central nervous system: A&O x4. no gross focal neurologic deficits, normal speech Psychiatry: depressed mood, congruent affect, judgement and insight appear normal  Labs   Data Reviewed: I have personally reviewed following labs and imaging studies  CBC: Recent Labs  Lab 05/13/19 0554 05/15/19 0412 05/19/19 0405  WBC 5.9 9.7 6.6  NEUTROABS 3.7 6.6 4.2  HGB 12.6* 12.3* 12.1*  HCT 39.3 39.0 37.7*  MCV 89 90.9 91.5  PLT 235 311 453*   Basic Metabolic Panel: Recent Labs  Lab 05/14/19 0525 05/15/19 0412 05/19/19 0405  NA 135 136 137  K 4.0 3.9 3.6  CL 105 105 104  CO2 22 21* 26  GLUCOSE 107* 107* 103*  BUN 12 10 11   CREATININE 1.13 1.08 0.97  CALCIUM 8.9 8.8* 8.6*  GFR: Estimated Creatinine Clearance: 106.7 mL/min (by C-G formula based on SCr of 0.97 mg/dL). Liver Function Tests: Recent Labs  Lab 05/14/19 0525 05/15/19 0412 05/19/19 0405  AST 73* 54* 33  ALT 94* 76* 35  ALKPHOS 128* 120 106  BILITOT 0.6 0.9 0.5  PROT 8.2* 7.9 7.5  ALBUMIN 3.4* 3.3* 3.0*   No results for input(s): LIPASE, AMYLASE in the last 168 hours. No results for input(s): AMMONIA in the last 168 hours. Coagulation Profile: No results for input(s): INR, PROTIME in the last 168 hours. Cardiac Enzymes: No results for input(s): CKTOTAL, CKMB, CKMBINDEX, TROPONINI in the last 168 hours. BNP (last 3 results) No results for input(s): PROBNP in the last 8760 hours. HbA1C: No results for input(s): HGBA1C in the last 72 hours. CBG: No results for input(s): GLUCAP in the last 168 hours. Lipid Profile: No results for input(s): CHOL, HDL, LDLCALC, TRIG, CHOLHDL, LDLDIRECT in the last 72 hours. Thyroid Function Tests: No  results for input(s): TSH, T4TOTAL, FREET4, T3FREE, THYROIDAB in the last 72 hours. Anemia Panel: No results for input(s): VITAMINB12, FOLATE, FERRITIN, TIBC, IRON, RETICCTPCT in the last 72 hours. Sepsis Labs: No results for input(s): PROCALCITON, LATICACIDVEN in the last 168 hours.  Recent Results (from the past 240 hour(s))  Culture, blood (routine x 2)     Status: Abnormal   Collection Time: 05/09/19  1:10 PM   Specimen: BLOOD  Result Value Ref Range Status   Specimen Description   Final    BLOOD RFA Performed at [facility], [address], [place], [place] [address]    Special Requests   Final    BOTTLES DRAWN AEROBIC AND ANAEROBIC Blood Culture results may not be optimal due to an excessive volume of blood received in culture bottles Performed at [facility], [address] [place], [place] [address]    Culture  Setup Time   Final    IN BOTH AEROBIC AND ANAEROBIC BOTTLES GRAM POSITIVE COCCI CRITICAL RESULT CALLED TO, READ BACK BY AND VERIFIED WITH: SCOTT HALL ON 05/10/19 AT 0052 [facility] Performed at [facility], [address] [place], [place] [address]    Culture STAPHYLOCOCCUS AUREUS (A)  Final   Report Status 05/12/2019 FINAL  Final   Organism ID, Bacteria STAPHYLOCOCCUS AUREUS  Final      Susceptibility   Staphylococcus aureus - MIC*    CIPROFLOXACIN >=8 RESISTANT Resistant     ERYTHROMYCIN >=8 RESISTANT Resistant     GENTAMICIN <=0.5 SENSITIVE Sensitive     OXACILLIN <=0.25 SENSITIVE Sensitive     TETRACYCLINE <=1 SENSITIVE Sensitive     VANCOMYCIN 1 SENSITIVE Sensitive     TRIMETH/SULFA <=10 SENSITIVE Sensitive     CLINDAMYCIN <=0.25 SENSITIVE Sensitive     RIFAMPIN <=0.5 SENSITIVE Sensitive     Inducible Clindamycin NEGATIVE Sensitive     * STAPHYLOCOCCUS AUREUS  Blood Culture ID Panel (Reflexed)     Status: Abnormal   Collection Time: 05/09/19  1:10 PM  Result Value Ref Range Status   Enterococcus species NOT DETECTED NOT DETECTED  Final   Listeria monocytogenes NOT DETECTED NOT DETECTED Final   Staphylococcus species DETECTED (A) NOT DETECTED Final    Comment: CRITICAL RESULT CALLED TO, READ BACK BY AND VERIFIED WITH: SCOTT HALL ON 05/10/19 AT 0052 [facility]    Staphylococcus aureus (BCID) DETECTED (A) NOT DETECTED Final    Comment: Methicillin (oxacillin) susceptible Staphylococcus aureus (MSSA). Preferred therapy is anti staphylococcal beta lactam antibiotic (Cefazolin or  [name]), unless clinically contraindicated. CRITICAL RESULT CALLED TO, READ BACK BY AND VERIFIED WITH: SCOTT HALL ON 05/10/19 AT 0052 [facility]    Methicillin resistance NOT DETECTED NOT DETECTED Final   Streptococcus species NOT DETECTED NOT DETECTED Final   Streptococcus agalactiae NOT DETECTED NOT DETECTED Final   Streptococcus pneumoniae NOT DETECTED NOT DETECTED Final   Streptococcus pyogenes NOT DETECTED NOT DETECTED Final   Acinetobacter baumannii NOT DETECTED NOT DETECTED Final   Enterobacteriaceae species NOT DETECTED NOT DETECTED Final   Enterobacter cloacae complex NOT DETECTED NOT DETECTED Final   Escherichia coli NOT DETECTED NOT DETECTED Final   Klebsiella oxytoca NOT DETECTED NOT DETECTED Final   Klebsiella pneumoniae NOT DETECTED NOT DETECTED Final   Proteus species NOT DETECTED NOT DETECTED Final   Serratia marcescens NOT DETECTED NOT DETECTED Final   Haemophilus influenzae NOT DETECTED NOT DETECTED Final   Neisseria meningitidis NOT DETECTED NOT DETECTED Final   Pseudomonas aeruginosa NOT DETECTED NOT DETECTED Final   Candida albicans NOT DETECTED NOT DETECTED Final   Candida glabrata NOT DETECTED NOT DETECTED Final   Candida krusei NOT DETECTED NOT DETECTED Final   Candida parapsilosis NOT DETECTED NOT DETECTED Final   Candida tropicalis NOT DETECTED NOT DETECTED Final    Comment: Performed at [facility], [address] [place], [place] [address]  Culture, blood (routine x 2)     Status: Abnormal   Collection Time:  05/09/19  1:52 PM   Specimen: BLOOD  Result Value Ref Range Status   Specimen Description   Final    BLOOD BLOOD LEFT FOREARM Performed at [facility], [address], [place], [place] [address]    Special Requests   Final    BOTTLES DRAWN AEROBIC AND ANAEROBIC Blood Culture adequate volume Performed at [facility], [address] [place], [place] [address]    Culture  Setup Time   Final    IN BOTH AEROBIC AND ANAEROBIC BOTTLES GRAM POSITIVE COCCI CRITICAL VALUE NOTED.  VALUE IS CONSISTENT WITH PREVIOUSLY REPORTED AND CALLED VALUE. Performed at [facility], [address] [place], [place] [address]    Culture (A)  Final    STAPHYLOCOCCUS AUREUS SUSCEPTIBILITIES PERFORMED ON PREVIOUS CULTURE WITHIN THE LAST 5 DAYS. Performed at [facility], [address] [place], [place] [address]    Report Status 05/12/2019 FINAL  Final  Respiratory Panel by RT PCR (Flu A&B, Covid) - Nasopharyngeal Swab     Status: None   Collection Time: 05/09/19  3:11 PM   Specimen: Nasopharyngeal Swab  Result Value Ref Range Status   SARS Coronavirus 2 by RT PCR NEGATIVE NEGATIVE Final    Comment: (NOTE) SARS-CoV-2 target nucleic acids are NOT DETECTED. The SARS-CoV-2 RNA is generally detectable in upper respiratoy specimens during the acute phase of infection. The lowest concentration of SARS-CoV-2 viral copies this assay can detect is 131 copies/mL. A negative result does not preclude SARS-Cov-2 infection and should not be used as the sole basis for treatment or other patient management decisions. A negative result may occur with  improper specimen collection/handling, submission of specimen other than nasopharyngeal swab, presence of viral mutation(s) within the areas targeted by this assay, and inadequate number of viral copies (<131 copies/mL). A negative result must be combined with clinical observations, patient history, and epidemiological information.  The expected result is Negative. Fact Sheet for Patients:  https://www.moore.com/ Fact Sheet for Healthcare Providers:  https://www.young.biz/ This test is not yet ap proved or cleared  by the [place] and  has been authorized for detection and/or diagnosis of SARS-CoV-2 by FDA under an Emergency Use Authorization (EUA). This EUA will remain  in effect (meaning this test can be used) for the duration of the COVID-19 declaration under Section 564(b)(1) of the Act, 21 U.S.C. section 360bbb-3(b)(1), unless the authorization is terminated or revoked sooner.    Influenza A by PCR NEGATIVE NEGATIVE Final   Influenza B by PCR NEGATIVE NEGATIVE Final    Comment: (NOTE) The Xpert Xpress SARS-CoV-2/FLU/RSV assay is intended as an aid in  the diagnosis of influenza from Nasopharyngeal swab specimens and  should not be used as a sole basis for treatment. Nasal washings and  aspirates are unacceptable for Xpert Xpress SARS-CoV-2/FLU/RSV  testing. Fact Sheet for Patients: https://www.moore.com/ Fact Sheet for Healthcare Providers: https://www.young.biz/ This test is not yet approved or cleared by the [place] FDA and  has been authorized for detection and/or diagnosis of SARS-CoV-2 by  FDA under an Emergency Use Authorization (EUA). This EUA will remain  in effect (meaning this test can be used) for the duration of the  Covid-19 declaration under Section 564(b)(1) of the Act, 21  U.S.C. section 360bbb-3(b)(1), unless the authorization is  terminated or revoked. Performed at [facility], [address] [place], [place] [address]   CULTURE, BLOOD (ROUTINE X 2) w Reflex to ID Panel     Status: None   Collection Time: 05/12/19 12:38 AM   Specimen: BLOOD  Result Value Ref Range Status   Specimen Description BLOOD RAC  Final   Special Requests BOTTLES DRAWN AEROBIC AND ANAEROBIC BCAV  Final    Culture   Final    NO GROWTH 5 DAYS Performed at [facility], [address] [place], [place] [address]    Report Status 05/17/2019 FINAL  Final  CULTURE, BLOOD (ROUTINE X 2) w Reflex to ID Panel     Status: None   Collection Time: 05/12/19 12:46 AM   Specimen: BLOOD  Result Value Ref Range Status   Specimen Description BLOOD LAC  Final   Special Requests BOTTLES DRAWN AEROBIC AND ANAEROBIC BCAV  Final   Culture   Final    NO GROWTH 5 DAYS Performed at [facility], [address] [place], [place] [address]    Report Status 05/17/2019 FINAL  Final      Imaging Studies   CT ANGIO CHEST AORTA W/CM &/OR WO/CM  Result Date: 05/18/2019 CLINICAL DATA:  Preoperative planning. History of aortic valve agitation. EXAM: CT ANGIOGRAPHY CHEST WITH CONTRAST TECHNIQUE: Multidetector CT imaging of the chest was performed using the standard protocol during bolus administration of intravenous contrast. Multiplanar CT image reconstructions and MIPs were obtained to evaluate the vascular anatomy. CONTRAST:  32mL OMNIPAQUE IOHEXOL 350 MG/ML SOLN COMPARISON:  None. FINDINGS: Cardiovascular: Heart is normal size. Aorta normal caliber measuring maximally 3.6 cm at the sinuses of Valsalva. There is abnormal collection of contrast noted which extends from the aortic outflow tract just proximal to the anterior leaflet of the aortic valve. This is concerning for ruptured aortic outflow tract or pseudoaneurysm. This appears contained within area of soft tissue measuring approximately 4.2 x 3.0 cm near the aortic valve. Stranding noted around the ascending thoracic aorta. Mediastinum/Nodes: No mediastinal, hilar, or axillary adenopathy. Trachea and esophagus are unremarkable. Thyroid unremarkable. Lungs/Pleura: Scarring in the left lower lobe posteriorly. Trace right pleural effusion. Upper Abdomen: Imaging into the upper abdomen shows no acute findings. Musculoskeletal: Left  chest wall external  pacer in place. Prior median sternotomy. No acute bony abnormality. Review of the MIP images confirms the above findings. 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 Surrounding soft tissue near the aortic valve and aortic root presumably thrombus. Stranding noted around the ascending thoracic aorta. Scarring in the left lower lobe. Trace right pleural effusion. Critical Value/emergent results were called by telephone at the time of interpretation on 05/18/2019 at 4:24 pm to provider [facility] , who verbally acknowledged these results. Electronically Signed   By: Rolm Baptise M.D.   On: 05/18/2019 16:34     Medications   Scheduled Meds:  aspirin EC  81 mg Oral Daily   atorvastatin  40 mg Oral QHS   bictegravir-emtricitabine-tenofovir AF  1 tablet Oral Daily   enoxaparin (LOVENOX) injection  40 mg Subcutaneous Q24H   Continuous Infusions:  sodium chloride Stopped (05/18/19 1250)    ceFAZolin (ANCEF) IV 2 g (05/19/19 1026)       LOS: 10 days   Total time spent:  30 minutes   Ezekiel Slocumb, DO [facility]   If 7PM-7AM, please contact night-coverage [email or web address] 05/19/2019, 1:05 PM

## 2019-05-19 NOTE — Progress Notes (Signed)
SUBJECTIVE: Patient resting comfortably.  Denies chest pain or shortness of breath.  Patient states he has been spoken to and is understanding of the plan to transfer to [facility].   Vitals:   05/18/19 0433 05/18/19 1205 05/18/19 1959 05/19/19 0535  BP: 99/64 102/65 102/62 116/70  Pulse: 82 91 96 80  Resp: 17  20 20   Temp: 98.3 F (36.8 C) 98.6 F (37 C) 98.3 F (36.8 C) 98.6 F (37 C)  TempSrc: Oral Oral Oral Oral  SpO2: 99% 99% 98% 100%  Weight:      Height:        Intake/Output Summary (Last 24 hours) at 05/19/2019 0959 Last data filed at 05/19/2019 0900 Gross per 24 hour  Intake 594.81 ml  Output 400 ml  Net 194.81 ml    LABS: Basic Metabolic Panel: Recent Labs    05/19/19 0405  NA 137  K 3.6  CL 104  CO2 26  GLUCOSE 103*  BUN 11  CREATININE 0.97  CALCIUM 8.6*   Liver Function Tests: Recent Labs    05/19/19 0405  AST 33  ALT 35  ALKPHOS 106  BILITOT 0.5  PROT 7.5  ALBUMIN 3.0*   No results for input(s): LIPASE, AMYLASE in the last 72 hours. CBC: Recent Labs    05/19/19 0405  WBC 6.6  NEUTROABS 4.2  HGB 12.1*  HCT 37.7*  MCV 91.5  PLT 453*   Cardiac Enzymes: No results for input(s): CKTOTAL, CKMB, CKMBINDEX, TROPONINI in the last 72 hours. BNP: Invalid input(s): POCBNP D-Dimer: No results for input(s): DDIMER in the last 72 hours. Hemoglobin A1C: No results for input(s): HGBA1C in the last 72 hours. Fasting Lipid Panel: No results for input(s): CHOL, HDL, LDLCALC, TRIG, CHOLHDL, LDLDIRECT in the last 72 hours. Thyroid Function Tests: No results for input(s): TSH, T4TOTAL, T3FREE, THYROIDAB in the last 72 hours.  Invalid input(s): FREET3 Anemia Panel: No results for input(s): VITAMINB12, FOLATE, FERRITIN, TIBC, IRON, RETICCTPCT in the last 72 hours.   PHYSICAL EXAM General: Well developed, well nourished, in no acute distress HEENT:  Normocephalic and atramatic Neck:  No JVD.  Lungs: Clear bilaterally to auscultation and  percussion. Heart: HRRR . Normal S1 and S2 without gallops or murmurs.  Abdomen: Bowel sounds are positive, abdomen soft and non-tender  Msk:  Back normal, normal gait. Normal strength and tone for age. Extremities: No clubbing, cyanosis or edema.   Neuro: Alert and oriented X 3. Psych:  Good affect, responds appropriately  TELEMETRY: Biventicular paced. 80/bpm  ASSESSMENT AND PLAN: Patient admitted to the hospital and found to have MSSA bacteremia. A TEE was completed and a 2 cm vegetation was found on his aortic valve.  Repeat blood cultures have been negative.  CTA chest/aorta resulted with possible ruptured aortic outflow tract or pseudoaneurysm. This appears contained within area of soft tissue measuring approximately 4.2 x 3.0 cm near the aortic valve. Stranding noted around the ascending thoracic aorta.  It has been deemed that the patient will most likely need a complete surgical replacement of previous aortic homografts with probable pacemaker replacement due to being the likely source of endocarditis per ID.  Plan is to transfer the patient to [facility] for surgical consideration.  The situation was discussed with the patient and he is understanding.  Principal Problem:   Sepsis (HCC) Active Problems:   Leukocytosis   Hypotension   AKI (acute kidney injury) (HCC)   Bacteremia due to Staphylococcus aureus    [facility], NP-C  05/19/2019 9:59 AM

## 2019-05-20 DIAGNOSIS — B20 Human immunodeficiency virus [HIV] disease: Secondary | ICD-10-CM

## 2019-05-20 DIAGNOSIS — Z21 Asymptomatic human immunodeficiency virus [HIV] infection status: Secondary | ICD-10-CM

## 2019-05-20 MED ORDER — ENOXAPARIN SODIUM 40 MG/0.4ML ~~LOC~~ SOLN
40.0000 mg | SUBCUTANEOUS | Status: DC
  Administered 2019-05-21: 40 mg via SUBCUTANEOUS
  Filled 2019-05-20 (×3): qty 0.4

## 2019-05-20 NOTE — Progress Notes (Signed)
PROGRESS NOTE  Mahmoud Blazejewski VOJ:[id number] DOB: 12/30/1975 DOA: 05/09/2019 PCP: Patient, No Pcp Per  Brief History   44 year old man PMH HIV, second heart valve surgery, status post pacemaker presented with fever and chills.  Found to have MSSA bacteremia, TEE showed 2 x 2 centimeter aortic valve vegetation.  A & P  MSSA sepsis and bacteremia present on admission --Cultures negative --Concern for pacemaker infection.  Will need pacemaker removed. --Followed by infectious disease.  Continue Ancef. --Sepsis symptomatology resolved.  Repeat cultures negative.  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  PMH includes prior aortic valve replacement, MV repair and additional complex CT surgery for prior endocarditis February 2020. --Continue IV antibiotics --Definitive management per CT surgery at [facility]  Dilated aortic root --Dr. Denton Lank my partner discussed with [facility] CT surgery as well as [facility], CT surgery, given complex past cardiac surgery history, referral to tertiary academic Medical Center recommended.  Patient was accepted in transfer by [facility] although is still awaiting a bed. --Continue IV Ancef per infectious disease --Per cardiology likely that the patient will need complete surgical replacement of prior aortic homografts and pacemaker replacement.  Chronic systolic CHF, LVEF 50-50% May 11, 2018. --Metoprolol has been on hold secondary to borderline low blood pressure  Acute kidney injury, hypotension, hyponatremia --Resolved  Mild transaminitis.  Hepatitis panel negative.  HIV --Planned to resume Symtuza (darunavir,+ cobi,+ TAF+, FTC), but not on formulary here.    So instead, patient started on Biktarvy (bictegravir-emtricitabine, tenofovir) per infectious disease.  Disposition Plan:  Discussion: Patient appears stable.  Awaiting transfer to [facility] for definitive management.  Status is: Inpatient  Remains inpatient appropriate because:IV treatments appropriate due  to intensity of illness or inability to take PO   Dispo: The patient is from: Home              Anticipated d/c is to: [facility] acute care hospital              Anticipated d/c date is: Whenever bed available              Patient currently medically stable  DVT prophylaxis: enoxaparin Code Status: Full Family Communication:     Brendia Sacks, MD  [facility] Direct contact: see [email or web address] (further directions at bottom of note if needed) 7PM-7AM contact night coverage as at bottom of note 05/20/2019, 10:39 AM  LOS: 11 days   Interval History/Subjective  No complaints today.  No chest pain or shortness of breath.  Feeling fine.  Tolerating diet.  Objective   Vitals:  Vitals:   05/19/19 2034 05/20/19 0430  BP: 128/75 108/77  Pulse: 98 82  Resp: 20 20  Temp: 98.8 F (37.1 C) 98.3 F (36.8 C)  SpO2: 99% 100%    Exam:  Constitutional.  Appears calm, comfortable.   Cardiovascular.  Regular rate and rhythm.  No murmur, rub or gallop.  No lower extremity edema.  2+ dorsalis pedis pulses bilaterally. Respiratory.  Clear to auscultation bilaterally.  No wheezes, rales or rhonchi.  Normal respiratory effort. Psychiatric.  Grossly normal mood and affect.  Speech fluent and appropriate.  I have personally reviewed the following:   Today's Data  . No new data  Scheduled Meds: . aspirin EC  81 mg Oral Daily  . atorvastatin  40 mg Oral QHS  . bictegravir-emtricitabine-tenofovir AF  1 tablet Oral Daily  . enoxaparin (LOVENOX) injection  40 mg Subcutaneous Q24H   Continuous Infusions: . sodium chloride Stopped (05/18/19 1250)  .  ceFAZolin (ANCEF) IV 2 g (05/20/19 1030)    Principal Problem:   Sepsis ([place]) Active Problems:   Leukocytosis   Hypotension   AKI (acute kidney injury) ([place])   Bacteremia due to Staphylococcus aureus   LOS: 11 days   How to contact the [facility] Attending or Consulting provider [place] or covering provider during after hours [place], for this  patient?  1. Check the care team in [facility] and look for a) attending/consulting TRH provider listed and b) the [facility] team listed 2. Log into [email or web address] and use [place]'s universal password to access. If you do not have the password, please contact the hospital operator. 3. Locate the [facility] provider you are looking for under [facility] and page to a number that you can be directly reached. 4. If you still have difficulty reaching the provider, please page the [facility] (Director on Call) for the Hospitalists listed on amion for assistance.

## 2019-05-21 NOTE — Progress Notes (Signed)
PROGRESS NOTE  James Ferrell [id number]:[id number] DOB: 13-Dec-1975 DOA: 05/09/2019 PCP: Patient, No Pcp Per  Brief History   44 year old man PMH HIV, second heart valve surgery, status post pacemaker presented with fever and chills.  Found to have MSSA bacteremia, TEE showed 2 x 2 centimeter aortic valve vegetation.  A & P  MSSA sepsis and bacteremia present on admission --Cultures negative --Concern for pacemaker infection.  Will need pacemaker removed. --Seen by infectious disease.  Continue Ancef. --Sepsis symptomatology resolved.  Repeat cultures negative.  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  PMH includes prior aortic valve replacement, MV repair and additional complex CT surgery for prior endocarditis February 2020. --Remained stable.  Continue IV antibiotics --Definitive management per CT surgery at [facility]  Dilated aortic root --Dr. Denton Lank my partner discussed with [facility] CT surgery as well as [facility], CT surgery, given complex past cardiac surgery history, referral to tertiary academic Medical Center recommended.  Patient was accepted in transfer by [facility] although is still awaiting a bed. --Continue IV Ancef per infectious disease --Per cardiology likely that the patient will need complete surgical replacement of prior aortic homografts and pacemaker replacement. --I discussed with cardiology fellow at [facility], we reviewed the CT findings, no changes suggested at this point.  Needs surgical evaluation on transfer, no new recommendations at this point.  Chronic systolic CHF, LVEF 50-50% May 11, 2018. --Metoprolol has been on hold secondary to borderline low blood pressure  Acute kidney injury, hypotension, hyponatremia --Resolved  Mild transaminitis.  Hepatitis panel negative.  HIV --Planned to resume Symtuza (darunavir,+ cobi,+ TAF+, FTC), but not on formulary here.    So instead, patient started on Biktarvy (bictegravir-emtricitabine, tenofovir) per infectious  disease.  Disposition Plan:  Discussion: Stable.  Await transfer.  Status is: Inpatient  Remains inpatient appropriate because:IV treatments appropriate due to intensity of illness or inability to take PO   Dispo: The patient is from: Home              Anticipated d/c is to: [facility] acute care hospital              Anticipated d/c date is: Whenever bed available              Patient currently medically stable  DVT prophylaxis: enoxaparin Code Status: Full Family Communication:     Brendia Sacks, MD  [facility] Direct contact: see [email or web address] (further directions at bottom of note if needed) 7PM-7AM contact night coverage as at bottom of note 05/21/2019, 3:05 PM  LOS: 12 days   Interval History/Subjective  No complaints, feels well, no SOB or CP.  Objective   Vitals:  Vitals:   05/21/19 0550 05/21/19 1152  BP: 105/68 112/64  Pulse: 89 83  Resp: 16 20  Temp: (!) 97.5 F (36.4 C) (!) 97.4 F (36.3 C)  SpO2: 97% 98%    Exam:  Constitutional.  Appears calm, comfortable. Respiratory.  Clear to auscultation bilaterally.  No wheezes, rales or rhonchi.  Normal respiratory effort. Cardiovascular.  Regular rate and rhythm.  No murmur, rub or gallop. Psychiatric.  Grossly normal mood and affect.  Speech fluent and appropriate.  I have personally reviewed the following:   Today's Data  . No new data  Scheduled Meds: . aspirin EC  81 mg Oral Daily  . atorvastatin  40 mg Oral QHS  . bictegravir-emtricitabine-tenofovir AF  1 tablet Oral Daily  . enoxaparin (LOVENOX) injection  40 mg Subcutaneous Q24H   Continuous Infusions: . sodium  chloride 250 mL (05/21/19 0937)  .  ceFAZolin (ANCEF) IV 2 g (05/21/19 0939)    Principal Problem:   Sepsis ([place]) Active Problems:   Leukocytosis   Hypotension   AKI (acute kidney injury) ([place])   Bacteremia due to Staphylococcus aureus   HIV (human immunodeficiency virus infection) ([place])   LOS: 12 days   How to contact the  [facility] Attending or Consulting provider [place] or covering provider during after hours [place], for this patient?  1. Check the care team in [facility] and look for a) attending/consulting TRH provider listed and b) the [facility] team listed 2. Log into [email or web address] and use [place]'s universal password to access. If you do not have the password, please contact the hospital operator. 3. Locate the [facility] provider you are looking for under [facility] and page to a number that you can be directly reached. 4. If you still have difficulty reaching the provider, please page the [facility] (Director on Call) for the Hospitalists listed on amion for assistance.

## 2019-05-22 ENCOUNTER — Ambulatory Visit: Admit: 2019-05-22 | Discharge: 2019-05-23 | Payer: MEDICAID

## 2019-05-22 DIAGNOSIS — I38 Endocarditis, valve unspecified: Principal | ICD-10-CM

## 2019-05-22 LAB — HEPATIC FUNCTION PANEL
ALT: 28 U/L (ref 0–44)
AST: 29 U/L (ref 15–41)
Albumin: 3.1 g/dL — ABNORMAL LOW (ref 3.5–5.0)
Alkaline Phosphatase: 96 U/L (ref 38–126)
Bilirubin, Direct: <0.1 mg/dL (ref 0.0–0.2)
Total Bilirubin: 0.7 mg/dL (ref 0.3–1.2)
Total Protein: 7.3 g/dL (ref 6.5–8.1)

## 2019-05-22 LAB — BASIC METABOLIC PANEL
Anion gap: 6 (ref 5–15)
BUN: 13 mg/dL (ref 6–20)
CO2: 26 mmol/L (ref 22–32)
Calcium: 9.1 mg/dL (ref 8.9–10.3)
Chloride: 106 mmol/L (ref 98–111)
Creatinine, Ser: 1.02 mg/dL (ref 0.61–1.24)
GFR calc Af Amer: >60 mL/min (ref >60)
GFR calc non Af Amer: >60 mL/min (ref >60)
Glucose, Bld: 104 mg/dL — ABNORMAL HIGH (ref 70–99)
Potassium: 4.1 mmol/L (ref 3.5–5.1)
Sodium: 138 mmol/L (ref 135–145)

## 2019-05-22 LAB — CBC
HCT: 39.9 % (ref 39.0–52.0)
Hemoglobin: 12.6 g/dL — ABNORMAL LOW (ref 13.0–17.0)
MCH: 28.6 pg (ref 26.0–34.0)
MCHC: 31.6 g/dL (ref 30.0–36.0)
MCV: 90.7 fL (ref 80.0–100.0)
Platelets: 498 10*3/uL — ABNORMAL HIGH (ref 150–400)
RBC: 4.4 MIL/uL (ref 4.22–5.81)
RDW: 12.2 % (ref 11.5–15.5)
WBC: 6.7 10*3/uL (ref 4.0–10.5)
nRBC: 0 % (ref 0.0–0.2)

## 2019-05-22 NOTE — Progress Notes (Signed)
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  No distress Chest bilateral air entry Heart sounds S1-S2 is 2 x 6 systolic murmur Abdomen soft CNS nonfocal Skin no rash  labs CBC Latest Ref Rng & Units 05/22/2019 05/19/2019 05/15/2019  WBC 4.0 - 10.5 K/uL 6.7 6.6 9.7  Hemoglobin 13.0 - 17.0 g/dL 12.6(L) 12.1(L) 12.3(L)  Hematocrit 39.0 - 52.0 % 39.9 37.7(L) 39.0  Platelets 150 - 400 K/uL 498(H) 453(H) 311    CMP Latest Ref Rng & Units 05/22/2019 05/19/2019 05/15/2019  Glucose 70 - 99 mg/dL 132(G) 401(U) 272(Z)  BUN 6 - 20 mg/dL 13 11 10   Creatinine 0.61 - 1.24 mg/dL 3.66 4.40  Sodium 135 - 145 mmol/L 138 137 136  Potassium 3.5 - 5.1 mmol/L 4.1 3.6 3.9  Chloride 98 - 111 mmol/L 106 104 105  CO2 22 - 32 mmol/L 26 26 21(L)  Calcium 8.9 - 10.3 mg/dL 9.1 3.47) 4.2(V)  Total Protein 6.5 - 8.1 g/dL - 7.5 7.9  Total Bilirubin 0.3 - 1.2 mg/dL - 0.5 0.9  Alkaline Phos 38 - 126 U/L - 106 120  AST 15 - 41 U/L - 33 54(H)  ALT 0 - 44 U/L - 35 76(H)   Microbiology: 4/17 -- 4/4 MSSA 4/20 BC- NG Impression/recommendation Staph aureus bacteremia with aortic valve 2X2 cm vegetation CTA shows 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 Stranding noted around the  ascending thoracic aorta Afebrile, hemodynamically stable and also normal leucocytes Cr okay Continue cefazolin 2 grams IV Q 8. Repeat culture 05/12/19 negative Awaiting transfer to [facility] for surgical intervention.    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

## 2019-05-22 NOTE — Progress Notes (Signed)
Pharmacy Antibiotic Note  James Ferrell is a 44 y.o. male admitted on 05/09/2019 with MSSA bacteremia.  Pharmacy was consulted for cefazolin dosing. This is day  #13 of IV cefazolin, there is no residual leukocytosis or recent febrile episodes and his renal function has improved to what appears to be his baseline level. Newly drawn blood cultures are negative to date (4/20). TEE this morning revealed a  2x2cm on non coronary cusp of AV. ID following.  -Endocarditis  Plan: continue cefazolin 2 grams IV every 8 hours  Height: 5\' 11"  (180.3 cm) Weight: 83.9 kg (184 lb 15.5 oz) IBW/kg (Calculated) : 75.3  Temp (24hrs), Avg:97.9 F (36.6 C), Min:97.4 F (36.3 C), Max:98.4 F (36.9 C)  Recent Labs  Lab 05/19/19 0405 05/22/19 0407  WBC 6.6 6.7  CREATININE 0.97 1.02    Estimated Creatinine Clearance: 101.5 mL/min (by C-G formula based on SCr of 1.02 mg/dL).    No Known Allergies  Antimicrobials this admission: 4/17 cefepime x 1 4/17 vancomycin x1 Cefazolin 4/18 >>   Microbiology results: 4/20 BCx: NGF 4/17 BCx: 4/4 MSSA  4/17 SARS CoV-2: negative  4/17 influenza A/B: negative  Thank you for allowing pharmacy to be a part of this patient's care.  5/17, PharmD 05/22/2019 6:56 AM

## 2019-05-22 NOTE — Progress Notes (Signed)
PROGRESS NOTE  James Ferrell ZMO:[id number] DOB: 1975-02-25 DOA: 05/09/2019 PCP: Patient, No Pcp Per  Brief History   44 year old man PMH HIV, second heart valve surgery, status post pacemaker presented with fever and chills.  Found to have MSSA bacteremia, TEE showed 2 x 2 centimeter aortic valve vegetation.  A & P  MSSA sepsis and bacteremia present on admission --Repeat cultures are negative.  There is concern for pacemaker infection, will need to be removed most likely.  --Seen by infectious disease.  Continue Ancef.  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  PMH includes prior aortic valve replacement, MV repair and additional complex CT surgery for prior endocarditis February 2020. --Remained stable, continue IV antibiotics --Definitive management per CT surgery at [facility]  Dilated aortic root --Dr. Denton Lank my partner discussed with [facility] CT surgery as well as [facility], CT surgery, given complex past cardiac surgery history, referral to tertiary academic Medical Center recommended.  Patient was accepted in transfer by [facility] although is still awaiting a bed. --Continue IV Ancef per infectious disease --Per cardiology likely that the patient will need complete surgical replacement of prior aortic homografts and pacemaker replacement. --I discussed with cardiology fellow at [facility], we reviewed the CT findings, no changes suggested at this point.  Needs surgical evaluation on transfer, no new recommendations at this point.. --Await transfer to [facility]  Chronic systolic CHF, LVEF 50-50% May 11, 2018. --Metoprolol has been on hold secondary to borderline low blood pressure  Acute kidney injury, hypotension, hyponatremia --Resolved  Mild transaminitis.  Hepatitis panel negative.  HIV --Planned to resume Symtuza (darunavir,+ cobi,+ TAF+, FTC), but not on formulary here.    So instead, patient started on Biktarvy (bictegravir-emtricitabine, tenofovir) per infectious disease.  Disposition  Plan:  Discussion: Remains stable.  Await transfer.  Status is: Inpatient  Remains inpatient appropriate because:IV treatments appropriate due to intensity of illness or inability to take PO   Dispo: The patient is from: Home              Anticipated d/c is to: [facility] acute care hospital              Anticipated d/c date is: Whenever bed available              Patient currently medically stable  DVT prophylaxis: enoxaparin Code Status: Full Family Communication:     Brendia Sacks, MD  [facility] Direct contact: see [email or web address] (further directions at bottom of note if needed) 7PM-7AM contact night coverage as at bottom of note 05/22/2019, 5:42 PM  LOS: 13 days   Interval History/Subjective  Feels fine, no complaints.  No chest pain or shortness of breath.  Objective   Vitals:  Vitals:   05/22/19 0538 05/22/19 1157  BP: (!) 98/57 (!) 88/61  Pulse: 88 87  Resp: 18 20  Temp: 97.9 F (36.6 C) 98.2 F (36.8 C)  SpO2: 99% 98%    Exam:  Constitutional.  Appears calm, comfortable. Respiratory.  Clear to auscultation bilaterally.  No wheezes, rales or rhonchi.  Normal respiratory effort. Cardiovascular.  Regular rate and rhythm.  No murmur, rub or gallop.  No lower extremity edema. Psychiatric.  Grossly normal mood and affect.  Speech fluent and appropriate.  I have personally reviewed the following:   Today's Data  . Basic metabolic panel unremarkable . CBC unremarkable  Scheduled Meds: . aspirin EC  81 mg Oral Daily  . atorvastatin  40 mg Oral QHS  . bictegravir-emtricitabine-tenofovir AF  1 tablet  Oral Daily  . enoxaparin (LOVENOX) injection  40 mg Subcutaneous Q24H   Continuous Infusions: . sodium chloride 250 mL (05/21/19 1812)  .  ceFAZolin (ANCEF) IV 2 g (05/22/19 1037)    Principal Problem:   Sepsis ([place]) Active Problems:   Leukocytosis   Hypotension   AKI (acute kidney injury) ([place])   Bacteremia due to Staphylococcus aureus   HIV (human  immunodeficiency virus infection) ([place])   LOS: 13 days   How to contact the [facility] Attending or Consulting provider [place] or covering provider during after hours [place], for this patient?  1. Check the care team in [facility] and look for a) attending/consulting TRH provider listed and b) the [facility] team listed 2. Log into [email or web address] and use [place]'s universal password to access. If you do not have the password, please contact the hospital operator. 3. Locate the [facility] provider you are looking for under [facility] and page to a number that you can be directly reached. 4. If you still have difficulty reaching the provider, please page the [facility] (Director on Call) for the Hospitalists listed on amion for assistance.

## 2019-05-23 NOTE — Progress Notes (Signed)
SUBJECTIVE: Patient denies any chest pain or shortness of breath and feeling well.   Vitals:   05/22/19 1157 05/22/19 2041 05/23/19 0523 05/23/19 1352  BP: (!) 88/61 122/64 (!) 96/58 101/67  Pulse: 87 90 82 85  Resp: 20 18  16   Temp: 98.2 F (36.8 C) 98.4 F (36.9 C) 98.7 F (37.1 C) 98.1 F (36.7 C)  TempSrc: Oral Oral Oral Oral  SpO2: 98% 100% 99% 99%  Weight:      Height:        Intake/Output Summary (Last 24 hours) at 05/23/2019 1445 Last data filed at 05/23/2019 0523 Gross per 24 hour  Intake --  Output 800 ml  Net -800 ml    LABS: Basic Metabolic Panel: Recent Labs    05/22/19 0407  NA 138  K 4.1  CL 106  CO2 26  GLUCOSE 104*  BUN 13  CREATININE 1.02  CALCIUM 9.1   Liver Function Tests: Recent Labs    05/22/19 0407  AST 29  ALT 28  ALKPHOS 96  BILITOT 0.7  PROT 7.3  ALBUMIN 3.1*   No results for input(s): LIPASE, AMYLASE in the last 72 hours. CBC: Recent Labs    05/22/19 0407  WBC 6.7  HGB 12.6*  HCT 39.9  MCV 90.7  PLT 498*   Cardiac Enzymes: No results for input(s): CKTOTAL, CKMB, CKMBINDEX, TROPONINI in the last 72 hours. BNP: Invalid input(s): POCBNP D-Dimer: No results for input(s): DDIMER in the last 72 hours. Hemoglobin A1C: No results for input(s): HGBA1C in the last 72 hours. Fasting Lipid Panel: No results for input(s): CHOL, HDL, LDLCALC, TRIG, CHOLHDL, LDLDIRECT in the last 72 hours. Thyroid Function Tests: No results for input(s): TSH, T4TOTAL, T3FREE, THYROIDAB in the last 72 hours.  Invalid input(s): FREET3 Anemia Panel: No results for input(s): VITAMINB12, FOLATE, FERRITIN, TIBC, IRON, RETICCTPCT in the last 72 hours.   PHYSICAL EXAM General: Well developed, well nourished, in no acute distress HEENT:  Normocephalic and atramatic Neck:  No JVD.  Lungs: Clear bilaterally to auscultation and percussion. Heart: HRRR . Normal S1 and S2 without gallops or murmurs.  Abdomen: Bowel sounds are positive, abdomen soft and  non-tender  Msk:  Back normal, normal gait. Normal strength and tone for age. Extremities: No clubbing, cyanosis or edema.   Neuro: Alert and oriented X 3. 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  Patient is waiting to be transferred to [facility].  I spoke to CT surgeon at [facility] yesterday and apparently the delay is due to requirement of private bed since patient has MRSA infection.  They are working on it.  Principal Problem:   Sepsis (HCC) Active Problems:   Leukocytosis   Hypotension   AKI (acute kidney injury) (HCC)   Bacteremia due to Staphylococcus aureus   HIV (human immunodeficiency virus infection) (HCC)    James Ferrell A, MD, [facility] 05/23/2019 2:45 PM

## 2019-05-23 NOTE — Progress Notes (Signed)
PROGRESS NOTE  James Ferrell [id number]:[id number] DOB: 01-04-1976 DOA: 05/09/2019 PCP: Patient, No Pcp Per  Brief History   44 year old man PMH HIV, second heart valve surgery, status post pacemaker presented with fever and chills.  Found to have MSSA bacteremia, TEE showed 2 x 2 centimeter aortic valve vegetation.  A & P  MSSA sepsis and bacteremia present on admission --Repeat cultures negative.  There is concern for pacemaker infection, will need to be removed most likely.  --Seen by infectious disease.  Continue Ancef.  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  PMH includes prior aortic valve replacement, MV repair and additional complex CT surgery for prior endocarditis February 2020. --Remains stable, continue IV antibiotics --Definitive management per CT surgery at [facility]  Dilated aortic root --Dr. Arbutus Ped my partner discussed with [facility] CT surgery as well as [facility], CT surgery, given complex past cardiac surgery history, referral to tertiary academic [facility] recommended.  Patient was accepted in transfer by [facility] although is still awaiting a bed. --Continue IV Ancef per infectious disease --Per cardiology likely that the patient will need complete surgical replacement of prior aortic homografts and pacemaker replacement. --I discussed with cardiology fellow at [facility], we reviewed the CT findings, no changes suggested at this point.  Needs surgical evaluation on transfer, no new recommendations at this point.. --Await transfer to [facility]  Chronic systolic CHF, LVEF 32-44% May 11, 2018. --Metoprolol has been on hold secondary to borderline low blood pressure  Acute kidney injury, hypotension, hyponatremia --Resolved  Mild transaminitis.  Hepatitis panel negative.  HIV --Planned to resume Symtuza (darunavir,+ cobi,+ TAF+, FTC), but not on formulary here.    So instead, patient started on Biktarvy (bictegravir-emtricitabine, tenofovir) per infectious disease.  Disposition  Plan:  Discussion: Remains stable.  Await transfer.  Status is: Inpatient  Remains inpatient appropriate because:IV treatments appropriate due to intensity of illness or inability to take PO  Dispo: The patient is from: Home              Anticipated d/c is to: [facility] acute care hospital              Anticipated d/c date is: Whenever bed available              Patient currently medically stable  DVT prophylaxis: enoxaparin Code Status: Full Family Communication:     Murray Hodgkins, MD  [facility] Direct contact: see [email or web address] (further directions at bottom of note if needed) 7PM-7AM contact night coverage as at bottom of note 05/23/2019, 1:14 PM  LOS: 14 days   Interval History/Subjective  No new issues.  Objective   Vitals:  Vitals:   05/22/19 2041 05/23/19 0523  BP: 122/64 (!) 96/58  Pulse: 90 82  Resp: 18   Temp: 98.4 F (36.9 C) 98.7 F (37.1 C)  SpO2: 100% 99%    Exam:  Constitutional.  Appears calm, comfortable.  I have personally reviewed the following:   Today's Data   Scheduled Meds: . aspirin EC  81 mg Oral Daily  . atorvastatin  40 mg Oral QHS  . bictegravir-emtricitabine-tenofovir AF  1 tablet Oral Daily  . enoxaparin (LOVENOX) injection  40 mg Subcutaneous Q24H   Continuous Infusions: . sodium chloride 250 mL (05/23/19 0127)  .  ceFAZolin (ANCEF) IV 2 g (05/23/19 1108)    Principal Problem:   Sepsis ([place]) Active Problems:   Leukocytosis   Hypotension   AKI (acute kidney injury) ([place])   Bacteremia due to Staphylococcus aureus  HIV (human immunodeficiency virus infection) (HCC)   LOS: 14 days   How to contact the [facility] Attending or Consulting provider 7A - 7P or covering provider during after hours 7P -7A, for this patient?  1. Check the care team in [facility] and look for a) attending/consulting TRH provider listed and b) the [facility] team listed 2. Log into [email or web address] and use [place]'s universal password to access. If you do not have  the password, please contact the hospital operator. 3. Locate the [facility] provider you are looking for under [facility] and page to a number that you can be directly reached. 4. If you still have difficulty reaching the provider, please page the [facility] (Director on Call) for the Hospitalists listed on amion for assistance.

## 2019-05-24 LAB — RESPIRATORY PANEL BY RT PCR (FLU A&B, COVID)
Influenza A by PCR: NEGATIVE
Influenza B by PCR: NEGATIVE
SARS Coronavirus 2 by RT PCR: NEGATIVE

## 2019-05-24 MED ORDER — ENOXAPARIN SODIUM 40 MG/0.4ML ~~LOC~~ SOLN: 40.0000 mg | SUBCUTANEOUS | Status: DC

## 2019-05-24 NOTE — Progress Notes (Signed)
PROGRESS NOTE  James Ferrell ZOX:[id number] DOB: 1976/01/15 DOA: 05/09/2019 PCP: Patient, No Pcp Per  Brief History   44 year old man PMH HIV, second heart valve surgery, status post pacemaker presented with fever and chills.  Found to have MSSA bacteremia, TEE showed 2 x 2 centimeter aortic valve vegetation.  A & P  MSSA sepsis and bacteremia present on admission --Repeat cultures negative.  There is concern for pacemaker infection, will need to be removed most likely.  --Seen by infectious disease.   --Stable.  Continue Ancef.  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  PMH includes prior aortic valve replacement, MV repair and additional complex CT surgery for prior endocarditis February 2020. --Remains stable, continue IV antibiotics --Definitive management per CT surgery at [facility]  Dilated aortic root --Dr. Denton Lank my partner discussed with [name] CT surgery as well as [facility], CT surgery, given complex past cardiac surgery history, [facility].  Patient was accepted in transfer by [facility] although is still awaiting a bed. --Continue IV Ancef per infectious disease --Per cardiology likely that the patient will need complete surgical replacement of prior aortic homografts and pacemaker replacement. --I discussed with cardiology fellow at [facility], we reviewed the CT findings, no changes suggested at this point.  Needs surgical evaluation on transfer, no new recommendations at this point.. --Await transfer to [facility]  Chronic systolic CHF, LVEF 50-50% May 11, 2018. --Metoprolol has been on hold secondary to borderline low blood pressure  Acute kidney injury, hypotension, hyponatremia --Resolved  Mild transaminitis.  Hepatitis panel negative.  HIV --Planned to resume Symtuza (darunavir,+ cobi,+ TAF+, FTC), but not on formulary here.    So instead, patient started on Biktarvy (bictegravir-emtricitabine, tenofovir) per infectious  disease.  Disposition Plan:  Discussion: Remains stable.  Await transfer.  Status is: Inpatient  Remains inpatient appropriate because:IV treatments appropriate due to intensity of illness or inability to take PO  Dispo: The patient is from: Home              Anticipated d/c is to: [facility] acute care hospital              Anticipated d/c date is: Whenever bed available.  Remains stable for transfer.              Patient currently medically stable  DVT prophylaxis: enoxaparin Code Status: Full Family Communication:     James Sacks, MD  [facility] Direct contact: see [email or web address] (further directions at bottom of note if needed) 7PM-7AM contact night coverage as at bottom of note 05/24/2019, 2:36 PM  LOS: 15 days   Interval History/Subjective  Feels okay.  No complaints voiced.  Objective   Vitals:  Vitals:   05/24/19 0416 05/24/19 1313  BP: 94/69 109/65  Pulse: 88 60  Resp: 17 16  Temp: 97.8 F (36.6 C) 97.6 F (36.4 C)  SpO2: 98% 98%    Exam:   Constitutional.  Appears calm and comfortable.  I have personally reviewed the following:   Today's Data   Scheduled Meds: . aspirin EC  81 mg Oral Daily  . atorvastatin  40 mg Oral QHS  . bictegravir-emtricitabine-tenofovir AF  1 tablet Oral Daily  . enoxaparin (LOVENOX) injection  40 mg Subcutaneous Q24H   Continuous Infusions: . sodium chloride 250 mL (05/23/19 0127)  .  ceFAZolin (ANCEF) IV 2 g (05/24/19 0942)    Principal Problem:   Sepsis (HCC) Active Problems:   Leukocytosis   Hypotension   AKI (  acute kidney injury) ([place])   Bacteremia due to Staphylococcus aureus   HIV (human immunodeficiency virus infection) ([place])   LOS: 15 days   How to contact the [facility] Attending or Consulting provider [place] or covering provider during after hours [place], for this patient?  1. Check the care team in [facility] and look for a) attending/consulting TRH provider listed and b) the [facility] team listed 2. Log into  [email or web address] and use [place]'s universal password to access. If you do not have the password, please contact the hospital operator. 3. Locate the [facility] provider you are looking for under [facility] and page to a number that you can be directly reached. 4. If you still have difficulty reaching the provider, please page the [facility] (Director on Call) for the Hospitalists listed on amion for assistance.

## 2019-05-25 ENCOUNTER — Ambulatory Visit (HOSPITAL_COMMUNITY)
Admission: AD | Admit: 2019-05-25 | Discharge: 2019-05-25 | Disposition: A | Discharge status: Home / Self Care | Payer: Medicaid Other | Source: Other Acute Inpatient Hospital | Attending: Family Medicine | Admitting: Family Medicine

## 2019-05-25 ENCOUNTER — Ambulatory Visit
Admit: 2019-05-25 | Discharge: 2019-06-02 | Disposition: A | Discharge status: Home Health | Payer: MEDICAID | Source: Other Acute Inpatient Hospital

## 2019-05-25 DIAGNOSIS — I38 Endocarditis, valve unspecified: Secondary | ICD-10-CM | POA: Insufficient documentation

## 2019-05-25 NOTE — Progress Notes (Signed)
[facility] called to let unit know that [facility] will transport patient but they could not provide an ETA at this time. James Ferrell

## 2019-05-25 NOTE — Progress Notes (Signed)
[facility] was called and report was given to [employer]. [facility] transport was also on the line as well during the report. Transportation will notified unit when a truck is available. Anselm Jungling

## 2019-05-25 NOTE — Progress Notes (Signed)
Patient transported via Doctor, general practice by [employer]. Belongings placed with patient.  James Ferrell

## 2019-05-26 MED ORDER — ATORVASTATIN CALCIUM 40 MG PO TABS
40.00 | ORAL_TABLET | ORAL | Status: DC
Start: 2019-05-29 — End: 2019-05-26

## 2019-05-26 MED ORDER — GENERIC EXTERNAL MEDICATION
300.00 | Status: DC
Start: 2019-05-26 — End: 2019-05-26

## 2019-05-26 MED ORDER — ENOXAPARIN SODIUM 40 MG/0.4ML ~~LOC~~ SOLN
40.00 | SUBCUTANEOUS | Status: DC
Start: 2019-06-03 — End: 2019-05-26

## 2019-05-26 MED ORDER — GENERIC EXTERNAL MEDICATION
Status: DC
Start: ? — End: 2019-05-26

## 2019-05-26 MED ORDER — GENERIC EXTERNAL MEDICATION
2.00 | Status: DC
Start: 2019-06-02 — End: 2019-05-26

## 2019-05-26 MED ORDER — DOLUTEGRAVIR SODIUM 50 MG PO TABS
50.00 | ORAL_TABLET | ORAL | Status: DC
Start: 2019-05-27 — End: 2019-05-26

## 2019-05-26 MED ORDER — ACETAMINOPHEN 325 MG PO TABS
650.00 | ORAL_TABLET | ORAL | Status: DC
Start: ? — End: 2019-05-26

## 2019-05-26 MED ORDER — EMTRICITABINE-TENOFOVIR AF 200-25 MG PO TABS
1.00 | ORAL_TABLET | ORAL | Status: DC
Start: 2019-05-27 — End: 2019-05-26

## 2019-05-28 MED ORDER — DOLUTEGRAVIR SODIUM 50 MG PO TABS
50.00 | ORAL_TABLET | ORAL | Status: DC
Start: 2019-06-02 — End: 2019-05-28

## 2019-05-28 MED ORDER — RIFAMPIN 300 MG PO CAPS
300.00 | ORAL_CAPSULE | ORAL | Status: DC
Start: 2019-06-02 — End: 2019-05-28

## 2019-05-28 MED ORDER — EMTRICITABINE-TENOFOVIR DF 200-300 MG PO TABS
1.00 | ORAL_TABLET | ORAL | Status: DC
Start: 2019-06-03 — End: 2019-05-28

## 2019-05-28 MED ORDER — GENERIC EXTERNAL MEDICATION
Status: DC
Start: ? — End: 2019-05-28

## 2019-05-29 LAB — GENOSURE PRIME (GSPRIL)

## 2019-05-29 MED ORDER — NAFCILLIN 6 G IN NS 250 ML INFUSION
INTRAVENOUS | 0 refills | 0.00000 days | Status: CP
Start: 2019-05-29 — End: 2019-07-06

## 2019-05-29 MED ORDER — GENTAMICIN 100 MG/100 ML IN SODIUM CHLORIDE(ISO) INTRAVENOUS PIGGYBACK
Freq: Three times a day (TID) | INTRAVENOUS | 0 refills | 11 days | Status: CP
Start: 2019-05-29 — End: 2019-06-09

## 2019-05-29 MED ORDER — RIFAMPIN 300 MG CAPSULE
ORAL_CAPSULE | Freq: Three times a day (TID) | 0 refills | 14.00000 days
Start: 2019-05-29 — End: 2019-05-29

## 2019-06-02 MED ORDER — EMTRICITABINE 200 MG-TENOFOVIR DISOPROXIL FUMARATE 300 MG TABLET
ORAL_TABLET | Freq: Every day | ORAL | 2 refills | 30 days | Status: CP
Start: 2019-06-02 — End: ?

## 2019-06-02 MED ORDER — PRAVASTATIN 80 MG TABLET
ORAL_TABLET | Freq: Every day | ORAL | 2 refills | 30 days | Status: CP
Start: 2019-06-02 — End: ?

## 2019-06-02 MED ORDER — DOLUTEGRAVIR 50 MG TABLET
ORAL_TABLET | Freq: Two times a day (BID) | ORAL | 2 refills | 30 days | Status: CP
Start: 2019-06-02 — End: ?

## 2019-06-02 MED ORDER — RIFAMPIN 300 MG CAPSULE
ORAL_CAPSULE | Freq: Three times a day (TID) | ORAL | 0 refills | 33.00000 days | Status: CP
Start: 2019-06-02 — End: 2019-07-05

## 2019-06-02 MED ORDER — GENTAMICIN 100 MG/100 ML IN SODIUM CHLORIDE(ISO) INTRAVENOUS PIGGYBACK
Freq: Two times a day (BID) | INTRAVENOUS | 0 refills | 11 days | Status: CP
Start: 2019-06-02 — End: 2019-06-13

## 2019-06-03 MED ORDER — SODIUM CHLORIDE FLUSH 0.9 % IV SOLN
10.00 | INTRAVENOUS | Status: DC
Start: 2019-06-02 — End: 2019-06-03

## 2019-06-03 MED ORDER — GENERIC EXTERNAL MEDICATION
Status: DC
Start: ? — End: 2019-06-03

## 2019-06-03 MED ORDER — PRAVASTATIN SODIUM 80 MG PO TABS
80.00 | ORAL_TABLET | ORAL | Status: DC
Start: 2019-06-03 — End: 2019-06-03

## 2019-06-17 ENCOUNTER — Encounter: Admit: 2019-06-17 | Discharge: 2019-06-17 | Payer: MEDICAID

## 2019-06-17 DIAGNOSIS — T827XXA Infection and inflammatory reaction due to other cardiac and vascular devices, implants and grafts, initial encounter: Principal | ICD-10-CM

## 2019-06-19 NOTE — Unmapped (Signed)
NEW PATIENT CALL:     Duration of Intervention: 10    New diagnosis: no     Previous HIV treatment: yes   See records     Transfer of care: yes    Currently on HIV meds: Yes, describe: Truvada     Other meds/health conditions/current symptoms (if sick, transfer to triage nurse of day):    Insurance: Medicaid     Transportation to appt: Yes, describe: Patient said that he would be figuring it out now. In the future patient informed he can get medicaid transportation.     PATIENT INFORMED OF THE FOLLOWING:  Date/time of appt, Provider, ID Clinic location, Allow time/money for parking, etc., Bring all meds in original containers if possible, they will need to get a medical record card if they have never been seen at [facility], [employer] card, 24 hr notice for cancellation, Montez Hageman available for anyone accompanying. Answered patients??? questions.

## 2019-06-23 ENCOUNTER — Ambulatory Visit: Admit: 2019-06-23 | Discharge: 2019-06-23 | Payer: MEDICAID | Attending: Family | Primary: Family

## 2019-06-23 ENCOUNTER — Ambulatory Visit
Admit: 2019-06-23 | Discharge: 2019-06-23 | Payer: MEDICAID | Attending: Addiction (Substance Use Disorder) | Primary: Addiction (Substance Use Disorder)

## 2019-06-23 DIAGNOSIS — R7881 Bacteremia: Secondary | ICD-10-CM

## 2019-06-23 DIAGNOSIS — Z21 Asymptomatic human immunodeficiency virus [HIV] infection status: Principal | ICD-10-CM

## 2019-06-23 DIAGNOSIS — B9561 Methicillin susceptible Staphylococcus aureus infection as the cause of diseases classified elsewhere: Secondary | ICD-10-CM

## 2019-06-23 DIAGNOSIS — B20 Human immunodeficiency virus [HIV] disease: Principal | ICD-10-CM

## 2019-06-23 DIAGNOSIS — Z113 Encounter for screening for infections with a predominantly sexual mode of transmission: Principal | ICD-10-CM

## 2019-06-23 MED ORDER — EMTRICITABINE 200 MG-TENOFOVIR DISOPROXIL FUMARATE 300 MG TABLET
ORAL_TABLET | Freq: Every day | ORAL | 2 refills | 30 days | Status: CP
Start: 2019-06-23 — End: ?
  Filled 2019-06-23: qty 30, 30d supply, fill #0

## 2019-06-23 MED ORDER — DOLUTEGRAVIR 50 MG TABLET
ORAL_TABLET | Freq: Two times a day (BID) | ORAL | 2 refills | 30.00000 days | Status: CP
Start: 2019-06-23 — End: ?
  Filled 2019-06-23: qty 60, 30d supply, fill #0

## 2019-06-23 MED FILL — EMTRICITABINE 200 MG-TENOFOVIR DISOPROXIL FUMARATE 300 MG TABLET: 30 days supply | Qty: 30 | Fill #0 | Status: AC

## 2019-06-23 MED FILL — TIVICAY 50 MG TABLET: 30 days supply | Qty: 60 | Fill #0 | Status: AC

## 2019-06-23 NOTE — Unmapped (Signed)
[facility] Specialty Medication Onboarding    Specialty Medication: TIVICAY AND GENERIC TRUVADA  Prior Authorization: Not Required   Financial Assistance: No - copay  <$25  Final Copay/Day Supply: $3 EACH / 45 DAYS FOR TIVICAY AND 30 DAYS FOR TRUVADA    Insurance Restrictions: None     Notes to Pharmacist:     The triage team has completed the benefits investigation and has determined that the patient is able to fill this medication at [facility]. Please contact the patient to complete the onboarding or follow up with the prescribing physician as needed.

## 2019-06-23 NOTE — Unmapped (Signed)
[facility]   Patient Onboarding/Medication Counseling    Warren [name] is a 44 y.o. male with HIV who I am counseling today on initiation of therapy.  I am speaking to the patient.    Was a Nurse, learning disability used for this call? No    Verified patient's date of birth / HIPAA.    Specialty medication(s) to be sent: Infectious Disease: emtricitabine-tenofovir  and Tivicay      Non-specialty medications/supplies to be sent: n/a      Medications not needed at this time: n/a         Tivicay (dolutegravir)    The patient declined counseling on medication administration, missed dose instructions, goals of therapy, side effects and monitoring parameters, warnings and precautions, drug/food interactions and storage, handling precautions, and disposal because they were counseled in clinic. The information in the declined sections below are for informational purposes only and was not discussed with patient.       Medication & Administration     Dosage: Take one tablet (50mg ) by mouth twice daily until 07/06/19 and then decrease to once daily thereafter    Administration: Take with or without food    Adherence/Missed dose instructions: take missed dose as soon as you remember. If it is close to the time of your next dose, skip the dose and resume with your next scheduled dose.    Goals of Therapy     to keep HIV levels at a non-detectable level on lab tests.    Side Effects & Monitoring Parameters     Common Side Effects:     ??? Headache  ??? Feeling tired or weak  ??? Trouble sleeping      The following side effects should be reported to the provider:  ??? Signs of an allergic reaction, like rash; hives; itching; red, swollen, blistered, or peeling skin with or without fever; wheezing; tightness in the chest or throat; trouble breathing, swallowing, or talking; unusual hoarseness; or swelling of the mouth, face, lips, tongue, or throat  ??? Signs of liver problems like dark urine, feeling tired, not hungry, upset stomach or stomach pain, light-colored stools, throwing up, or yellow skin or eyes  ??? Fever  ??? Muscle or joint pain  ??? Mouth sores  ??? Eye irritation.   ??? Shortness of breath  ??? Feeling very tired or weak  ??? Signs of infection like fever, sore throat, weakness, cough, or shortness of breath.      Contraindications, Warnings, & Precautions     ??? Hepatotoxicity  ??? Hypersensitivity reactions: Rash, constitutional findings, and organ dysfunction (eg, liver injury) have been reported.  ??? Immune reconstitution syndrome: Patients may develop immune reconstitution syndrome resulting in the occurrence of an inflammatory response to an indolent or residual opportunistic infection during initial HIV treatment or activation of autoimmune disorders (eg, Graves disease, polymyositis, Guillain-Barr?? syndrome) later in therapy  ??? Use caution in patients with renal or hepatic impairment    Drug/Food Interactions     ??? Medication list reviewed in Epic. The patient was instructed to inform the care team before taking any new medications or supplements. Tivicay levels are decreased with the rifampin. Dose is increased to BID to accomodate.   ??? Tivicay should be taken 2 hours before or 6 hours after taking cation-containing antacids or laxatives, sucralfate, oral supplements containing iron or calcium, or buffered medications.  ??? Tivicay and supplements containing calcium or iron can be taken together with food.    Storage, Handling  Precautions, & Disposal     For the 10 mg tablet: Store in the original container. Do not take out the antimoisture cube or packet.    For all products:     ??? Store at room temperature in a dry place. Do not store in a bathroom.  ??? Keep lid tightly closed.  ??? Keep all drugs in a safe place  ??? Keep all drugs out of the reach of children and pets.   ??? Throw away unused or expired drugs. Do not flush down a toilet or pour down a drain unless you are told to do so. Check with your pharmacist if you have questions about the best way to throw out drugs. There may be drug take-back programs in your area.    Truvada (emtricitabine and tenofovir disoproxil fumarate)    The patient declined counseling on medication administration, missed dose instructions, goals of therapy, side effects and monitoring parameters, warnings and precautions, drug/food interactions and storage, handling precautions, and disposal because they were counseled in clinic. The information in the declined sections below are for informational purposes only and was not discussed with patient.       Medication & Administration     Dosage: Take one tablet by mouth once daily.    Administration: Take with or without food    Adherence/Missed dose instructions: Take missed dose as soon as you remember. If it is close to the time of your next dose, skip the dose and resume with your next scheduled dose.    Goals of Therapy     ??? The goal is to keep HIV levels at a non-detectable level on lab tests for patients who have HIV      Side Effects & Monitoring Parameters   Common Side Effects:     ??? Upset stomach  ??? Diarrhea  ??? Not able to sleep  ??? Dizziness.   ??? Headache  ??? Strange or odd dreams  ??? Feeling tired or weak.    The following side effects should be reported to the provider:    ??? Signs of an allergic reaction, like rash; hives; itching; red, swollen, blistered, or peeling skin with or without fever; wheezing; tightness in the chest or throat; trouble breathing, swallowing, or talking; unusual hoarseness; or swelling of the mouth, face, lips, tongue, or throat  ??? Signs of kidney problems like unable to pass urine, change in how much urine is passed, blood in the urine, or a big weight gain.  ??? Signs of liver problems like dark urine, feeling tired, not hungry, upset stomach or stomach pain, light-colored stools, throwing up, or yellow skin or eyes.   ??? Signs of too much lactic acid in the blood (lactic acidosis) like fast breathing, fast heartbeat, a heartbeat that does not feel normal, very bad upset stomach or throwing up, feeling very sleepy, shortness of breath, feeling very tired or weak, very bad dizziness, feeling cold, or muscle pain or cramps.   ??? Bone pain.   ??? Muscle pain or weakness.   ??? Pain in arms or legs.    Monitoring Parameters:     - CBC with differential  - Serum creatinine  - Urine glucose  - Urine protein (prior to or when initiating therapy and as clinically indicated during therapy);  - Serum phosphorus (in patients with chronic kidney disease)  - Hepatic function tests  - Testing for hepatitis B virus (HBV) is recommended prior to or when initiating antiretroviral therapy.  -  Bone density (in patients with a history of bone fracture or have risk factors for bone loss)  - CD4 count  - HIV RNA plasma levels    Contraindications, Warnings, & Precautions     ??? Black Box Warning: Acute, severe exacerbations of HBV have been reported in HBV-infected patients following discontinuation of antiretroviral therapy  ??? Decreased bone densitiy  ??? Lactic acidosis  ??? Renal toxicicy    Drug/Food Interactions     ??? Medication list reviewed in Epic. The patient was instructed to inform the care team before taking any new medications or supplements. No drug interactions identified.     Storage, Handling Precautions, & Disposal     ??? Store this medication at room temperature.  ??? Store in the original container at room temperature  ??? Keep lid tightly closed  ??? Store in a dry place. Do not store in a bathroom.  ??? Keep all drugs in a safe place. Keep all drugs out of the reach of children and pets  ??? Throw away unused or expired drugs. Do not flush down a toilet or pour down a drain unless you are told to do so. Check with your pharmacist if you have questions about the best way to throw out drugs. There may be drug take-back programs in your area          Current Medications (including OTC/herbals), Comorbidities and Allergies     Current Outpatient Medications   Medication Sig Dispense Refill   ??? dolutegravir (TIVICAY) 50 mg Tab TABLET Take 1 tablet (50 mg total) by mouth Two (2) times a day. On 6/14, decrease to 50 mg (1 tablet) by mouth once daily 60 tablet 2   ??? emtricitabine-tenofovir, TDF, (TRUVADA) 200-300 mg per tablet Take 1 tablet by mouth daily. 30 tablet 2   ??? pravastatin (PRAVACHOL) 80 MG tablet Take 1 tablet (80 mg total) by mouth daily. 30 tablet 2   ??? rifAMPin (RIFADIN) 300 MG capsule Take 1 capsule (300 mg total) by mouth Three (3) times a day. 99 capsule 0   ??? sodium chloride 0.9 % SolP 250 mL with nafcillin 2 gram SolR 6 g Infuse 0.5 g/hr into a venous catheter continuous. 19000 mL 0     No current facility-administered medications for this visit.       No Known Allergies    Patient Active Problem List   Diagnosis   ??? Acute bacterial endocarditis   ??? History of prosthetic aortic valve   ??? HIV (human immunodeficiency virus infection) (CMS-HCC)   ??? Bacteremia due to Staphylococcus aureus   ??? Tobacco use disorder       Reviewed and up to date in Epic.    Appropriateness of Therapy     Is medication and dose appropriate based on diagnosis? Yes    Prescription has been clinically reviewed: Yes    Baseline Quality of Life Assessment      How many days over the past month did your HIV  keep you from your normal activities? For example, brushing your teeth or getting up in the morning. 0    Financial Information     Medication Assistance provided: None Required    Anticipated copay of $3.00 for Tivicay and $3.00 for emctricitabine/tenofovir disoproxil fumarate reviewed with patient. Verified delivery address.    Delivery Information     Scheduled delivery date: 06/23/19    Expected start date: 06/24/19    Medication will be delivered via [facility] [place]  [facility]arm[facility]ID Clinic clinic to the temporary address in Epic [place].  This shipment will not require a signature.      Explained the services we provide at [facility] and that each month we would call to set up refills.  Stressed importance of returning phone calls so that we could ensure they receive their medications in time each month.  Informed patient that we should be setting up refills 7-10 days prior to when they will run out of medication.  A pharmacist will reach out to perform a clinical assessment periodically.  Informed patient that a welcome packet and a drug information handout will be sent.      Patient verbalized understanding of the above information as well as how to contact the pharmacy at [phone] option [phone] with any questions/concerns.  The pharmacy is open Monday through Friday 8:30am-4:30pm.  A pharmacist is available 24/7 via pager to answer any clinical questions they may have.    Patient Specific Needs     - Does the patient have any physical, cognitive, or cultural barriers? No    - Patient prefers to have medications discussed with  Patient     - Is the patient or caregiver able to read and understand education materials at a high school level or above? Yes    - Patient's primary language is  English     - Is the patient high risk? No     - Does the patient require a Care Management Plan? No     - Does the patient require physician intervention or other additional services (i.e. nutrition, smoking cessation, social work)? No      [name]  [facility] Specialty Pharmacist
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
## 2019-06-23 NOTE — Unmapped (Addendum)
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     Relevant results:    Date 06/02/19  (inpt) 06/09/19 06/17/19       WBC 5.4 6.9 3.8       H&H 11.8/40.0 11.2/34.9 12.6/42.9       Plt 382 293 326       ANC 4.2 (4/27) 3.8 2.4       ALC 1.3 (4/27) 2.0 0.9       EOS 0.1 (4/27) 0.4 0.2       BUN 10 11 15        SCr 1.23 1.54 2.00       K + 4.1 4.5 3.9       AST 27 (5/3) 12 24       ALT 23 (5/3) 8 17       Mg 2.2 2.4 //       Gent T 0.7 (5/10) 0.7 //                             5/19: Labs stable from discharge. Small bump in Scr. Called patient who reports that water intake is about two average size water bottles a day. Encouraged patient to increase fluids intake to include an extra bottle of water at least . He agreed and will do so. He denies any new symptoms. He states that he stopped the [name] yesterday as directed but was unable to pick up Rifampin till yesterday due to pharmacy not having his insurance information. He took his first dose today. He understands to take the medication three times a day, stressed importance of taking Rifampin consistently in conjunction with [name]. Verbalized understanding and will do so. Denies any issues with PICC line. He was seen via video visit with [name] at [facility] on 06/04/19 and he reports that surgery is planned for him some time in June. Will continue to monitor labs and patient closely. Called [name] to discontinue [name] and Magnesium level and continue weekly CBC with diff and CMP.    5/26: Scr elevated from baseline called patient to assess fluid intake. Patient reported that he was drinking about 1 8 oz bottle of water a day. Educated the patient on the importance of adequate water intake and said he should aim for six  8 oz glasses a day. Patient reported that he understood and would increase water intake. Patient reported that the previous two shipment of medication there were IV bags that had to be discarded as they were damaged, message sent to [facility] regarding this issue. All other labs stable    6/1: Patient seen in clinic for visit today and RN unable to draw labs from the PICC line and slow flow with IV push. Patient unable to stay in clinic today for Alteplase and peripheral blood draw. Left message with [facility] for [facility] to let them know that Alteplase is being sent to patient's home today.     Next scheduled labs: next week    Action taken:  1. Continue IV [name] 2g Q4H   2. Continue PO Rifampin 300mg   TID (first dose 06/10/19 due to pharmacy issue)  3. Plan discussed with [facility] Scientist, clinical (histocompatibility and immunogenetics)) and Home Health agency  4. Time spent on documentation and coordination of care: 15 Minutes      [facility] OPAT Program  OPAT Program Phone: [phone]   OPAT Program Fax:  [phone]   [facility] ID Clinic Phone: [phone]

## 2019-06-23 NOTE — Unmapped (Signed)
Called pt to talk about RW services and Caps on Charges at our clinic. Pt declined RW services at this time.     Sherene Sires     Time Duration of intervention in minutes: 5 mins

## 2019-06-23 NOTE — Unmapped (Signed)
Provider: Roselyn Meier    Nurse: Francisca December    Case Manager: [place]    Social Work Assessment    Duration of Intervention:     History of HIV:   ??? Diagnosis Date/Location: [place] in 2008    ??? Previous/Current HIV regimen: Trivicay 50 mg BID and Truvada daily  o Treatment Adherence History: has some missed doses and has history of adherence issues  o Barriers to Adherence: Competing Priorities  ??? History of Hospitalizations/Surgeries: Patient had open heart surgery in [place].  Is planning on another open heart surgery on June 27th.  Discussed anxiety over procedure.  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      Non-HIV Medial Concerns:   ??? General History: Cardiology doctor: Gala Lewandowsky    ??? Primary Care Provider: None at this time    o Current specialty providers? Yes.    o Need referral to other specialties? Yes.    ??? Dental Needs/Insurance:  Yes.    ??? Vision Needs/Insurance: No.    ??? Need Assistance with ADLs? No.    Mental Health and Substance Use History   ENGAGEMENT: Patient presented a willingness to participate in treatment.  PARTICIPATION QUALITY:    Active and Cooperative  MOOD:  PRETTY GOOD     AFFECT:  FULL RANGE and CONGRUENT , anxious  MENTAL STATUS:     alert and oriented     Patient Strengths/Coping Mechanisms:  Interests/Hobbies: Patient expressed liking being by the water and fishing/boating but can't do that right now.  Patient watches TV for entertainment.   Literacy Concerns: No.    ??? PHQ-9 Score: none/minimal- 2  ??? GAD-7 Score: 3, somewhat difficult- none/minimal  ??? Tobacco Use History:  Current smoker, half a pack a day  ??? SAMISS Summary: (.SAMISS): Patient endorses alcohol use without dependence.  Patient has drank alcohol within the past 2 weeks and reports drinking alcohol between 2 and 4 times a month.  Patient rarely ever drinks more than 4 drinks in one sitting.  Patient endorses marijuana use but does not report dependence on marijuana. Patient has been prescribed pain medication but does not take pain medication more than prescribed.  He reports no legal, social or biological effects from substance use.   ??? Mental Health History: No history of mental health issues  ??? Current Mental Health Concerns: None reported at this time      Social History    ??? Describe housing situation (where do you live, who do you live with): Patient lives with a friend of a friend in [place], [place].  Patient says that his housing situation is stable but not long term. Patient reports needing to find his own place stay. 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  Patient reports that friend has a family and is not able to rely on her. Patient family still lives in [place] but they do not provide ongoing support  o Ever any issues paying bills? Yes. Patient interested in HOPWA funds  o Does anyone help you pay bills? Yes.    ??? Employment/Education:   o Goals: Patient used to work at [employer].  Patient had a stroke and can no longer work.  Patient is prioritizing medical needs     Transportation (discuss SafeRide if indicated): Patient has access to [facility] transportation. Provided education around RW benefits and transportation.   ??? Health Insurance Status: Medicaid  o  HMAP/RW Education: Yes  o Preferred Pharmacy: Patient experiences a lot of stigma and doesn't want prescriptions to be mailed to local pharmacy. Patient problem solving best ways to get access to refills  - Refill Education- provided    ??? Other Financial Stress? No.    ??? Access to Food:  o Food Stamps? Yes. $212/month  o Nutrition Goals: Patient interested in talking with nutritionist    ??? History of Justice System Involvement/Legal Needs: Patient has anxiety around upcoming heart surgery and would like to get affairs in order (living will, power of attorney). Patient said that he with previous surgery his aunt was made power of attorney.     ??? Current Support System: Patient only has 1 friend in [place], the rest of family lives in [place].  Patient feels all alone and isolatedd    o Family Composition: lives with a friend of his friend in [place], [place]. No other supports in the area.     o Friendships and Romantic Relationships: None at this time  - Sexual Orientation: Prefer not to answer  - Gender Identity/Pronouns: he/him/his  - Prevention Education: provided    ??? Experience of Stigma: Patient reports a very different feeling living in [place] and that he has heard of lots of stigma toward HIV in his home town. Patient worried about having prescriptions delivered to hometown.      ??? Communication Preferences  o Calls: Yes.  - Voicemail: Yes.  o Texting: No.  - Texting Agreement signed? No.  - Expiration Date  o Email: No.  o Social Media: No.    Community Case Management/Referrals Made Today: SW referred patient to benefits coordinators.  Patient will need help setting up Medicaid transportation, access to [employer], nutritionist consultation, [facility] for living wills/power of attorney.       Acuity Score: Level 2  MEDICAL CASE MANAGEMENT ACUITY SCALE  Note: If any of the following conditions apply, the acuity level is automatically 3 and the acuity must be reassessed in 90 days:   []   Released from a correctional facility within the past  90 days     []  Diagnosed with HIV in the last 180 days  []   Currently homeless   []   Pregnant    Knowledge & understanding of HIV as a medical diagnosis, transmission, & medications   []  1-  Complete understanding of HIV disease process, transmission & medications  [x]  2- Periodic education of client on HIV disease process, transmission, and/or medications  []  3- Minimal knowledge of HIV, transmission, and/or medications  []  4- No knowledge of HIV, transmission risks, and/or medications    Basic Needs  []  1- Client is able to meet own basic needs. Client is able to access community assistance on their own as needed.  []  2- Occasional help to access assistance   [x]  3- Difficulty accessing assistance. Often w/o basics.   []  4- Has limited access to food. Without most basic needs.     Transportation:  []  1-  Has reliable transportation. Is able to cover costs of transportation, Bus tickets  []  2-  Needs occasional assistance < 2 times per year, Ride arrangement needed  [x]  3- No means. Under or un-served area for public transportation. Needs assistance 3-6 times per year.  []  4- Serious impact on access to medical care. Needs assistance > 7 times per year.    Health Insurance/medical care coverage  []  1- Has own medical insurance and payer. Able  to access medical care.  [x]  2- Enrolled in medical care benefits program. Needs occasional assistance accessing medical care < 2 times per year.  []  3- Needs referral to access insurance or medical care benefits program. No medical crisis. Needs assistance accessing medical care 3-6 times per year.  []  4- Needs immediate assistance to access insurance or medical care benefits program. Medical crisis. Does not have access to medical care.    Self sufficiency  []  1- Independent. Can follow-up on referrals and can access services.  [x]  2- Sometimes requires assistance in follow-up and completing forms.  []  3- Difficulty with follow-up, completing forms and accessing services.  []  4- Never follows-up, unable to complete forms, burns bridges.    Housing/Living arrangement  []  1- Living in clean, habitable, stable housing. Does not need assistance.  []  2- Stable housing subsidized or not. Occasionally needs assistance with paying for housing <2 times per year.  [x]  3- Unstable housing subsidized or not. Housing subsidy violation or eviction imminent. Frequently accesses housing assistance 3-6 times per year.  []  4- Unable to live independently. Recently evicted. Homeless. Temporary housing. Accesses assistance > 7 times per year.    Risk Behavior  []  1- Understand risks & practices harm reduction behavior.  [x]  2- Poor understanding of risk and no exposure to high risk situations. Risks explained but continue to engage in risky behaviors.  []  3- Has poor knowledge and/or occasionally engages in risky behaviors.  []  4- Lacks knowledge and/or engages in significant risky behaviors.    Substance Use  [x]  1- No difficulties with substance use. No need for referral.  []  2-  Past problems-less than 1 yr. recovery, recurrent problems. Not impacting ability to pay bills or health.  []  3-  Current substance use ??? willing to seek help. Impacts ability to pay bills and access to medical care.  []  4- Current substance use ??? not willing to seek help. Unable to pay bills or maintain medical care because of addiction.    Dental   []  1- Has own medical insurance and payer. Able to access dental care.  [x]  2- Aware of dental services offered and requires assistance accessing dental care < 2 times per year, Referral needed.  []  3-  Needs information and  referral to access dental services. No dental crisis. Needs information or education on dental services available.  []  4- Needs immediate assistance to access dental care benefits program. Dental crisis. Does not have access to dental care.    Mental Health  [x]  1- No history of mental health problems. No need for referral  []  2-  Past problems and/or reports current difficulties/stress ??? is functioning or already engaged in mental health care.  []  3- Experiencing severe difficulty in day-to-day functioning. Requires significant support. Needs referral to mental health care.  []  4- Danger to self or others, needs immediate intervention. Needs but not accessing therapy.    Total Points:21      Level 2  11-25 points = medium  ??? Initial Assessment and Acuity  ??? Annual Re-Assessment  ??? Assess Acuity every 6 months  ??? Minimum contact (telephone or face-toface) every six months to verify address/phone number, to check on client???s current status  ??? Service Plan update every 6 months  ??? Documentation in progress notes    __------------------------------------------------------------------------------------    ID Care Plan    Basic Needs: Housing, Social Support, Transportation, Armed forces operational officer, Food, Safety     Need: Patient poses barrier to care  and/or treatment based on lack of resources.    Short-term goals:   1. Assess for specific resources that would facilitate care and/or treatment.  2. Assess for resources afforded to the patient.  (Friends, family members [place], community, and [employer], faith based network, insurance, Medicare, Architectural technologist.)   3. Assess for eligibility   (Age status, Legal status, Marriage status)    Person/ agencies:  Careers adviser, faith based organizations, Optometrist,     Intervention:   1. Provide linked referral to [facility] resource within specified time frame.  2. Provide linked referral to [employer] funded program within specified time frame- ROI required  3. Provide linked referral to community based organization -NOT Juanell Fairly funded within specified time frame- ROI required  4. Provide unlinked referral with specified time frame.    Outcome:   1. Follow up with agency as to the status of the referral- ROI required- document in EPIC.  2. Follow up with patient regarding the outcome of the referral and document in EPIC.  3. Reassess for barriers to care and treatment.    Dev Dhondt LCSWA, LCAS  ID Clinic Social Work   Direct: [phone]                                   Enrollment in [facility], MH, or SUD Services? Yes.  *

## 2019-06-23 NOTE — Unmapped (Signed)
Pt came to clinic today and declined RW services. Provider spoke w/pt and asked that we reach out, pt wanted to complete application.     Called pt to complete RW application. Completed over the phone, pt made aware that without supporting documents, application cannot be processed. Pt is going to email SSD award letter, will use this for income and proof of residency.    Kaitlyn Abshire  Benefits Coordinator, ID Clinic  Time of intervention: 15 mins

## 2019-06-23 NOTE — Unmapped (Signed)
Duration of Intervention: 5 min     SW called patient after being 20 min late to New Social Work Appointment. Patient said that he was at the hospital and got confused of location of clinic. SW provided address to the clinic and patient expressed being in route to clinic. Patient is open to phone orientation with social work and agreed to sign SW paperwork before he leaves appointment.     Deiona Hooper LCSWA, LCAS  ID Clinic Social Work   Direct: [phone]

## 2019-06-24 ENCOUNTER — Ambulatory Visit: Admit: 2019-06-24 | Discharge: 2019-06-24 | Payer: MEDICAID

## 2019-06-24 ENCOUNTER — Institutional Professional Consult (permissible substitution): Admit: 2019-06-24 | Discharge: 2019-06-24 | Payer: MEDICAID | Attending: Family | Primary: Family

## 2019-06-24 DIAGNOSIS — Z21 Asymptomatic human immunodeficiency virus [HIV] infection status: Principal | ICD-10-CM

## 2019-06-24 DIAGNOSIS — N179 Acute kidney failure, unspecified: Principal | ICD-10-CM

## 2019-06-24 LAB — CBC W/ AUTO DIFF
BASOPHILS ABSOLUTE COUNT: 0 10*9/L (ref 0.0–0.1)
BASOPHILS RELATIVE PERCENT: 0.7 %
EOSINOPHILS ABSOLUTE COUNT: 0.3 10*9/L (ref 0.0–0.7)
HEMATOCRIT: 33.9 % — ABNORMAL LOW (ref 38.0–50.0)
HEMOGLOBIN: 10.9 g/dL — ABNORMAL LOW (ref 13.5–17.5)
LYMPHOCYTES RELATIVE PERCENT: 31.1 %
MEAN CORPUSCULAR HEMOGLOBIN CONC: 32.1 g/dL (ref 30.0–36.0)
MEAN CORPUSCULAR HEMOGLOBIN: 30.4 pg (ref 26.0–34.0)
MEAN CORPUSCULAR VOLUME: 94.8 fL (ref 81.0–95.0)
MEAN PLATELET VOLUME: 6.8 fL — ABNORMAL LOW (ref 7.0–10.0)
MONOCYTES ABSOLUTE COUNT: 0.4 10*9/L (ref 0.1–1.0)
MONOCYTES RELATIVE PERCENT: 11.3 %
NEUTROPHILS ABSOLUTE COUNT: 1.6 10*9/L — ABNORMAL LOW (ref 1.7–7.7)
NEUTROPHILS RELATIVE PERCENT: 48 %
PLATELET COUNT: 329 10*9/L (ref 150–450)
RED CELL DISTRIBUTION WIDTH: 15 % (ref 12.0–15.0)
WBC ADJUSTED: 3.4 10*9/L — ABNORMAL LOW (ref 3.5–10.5)

## 2019-06-24 LAB — BASIC METABOLIC PANEL
ANION GAP: 10 mmol/L (ref 3–11)
BUN / CREAT RATIO: 10
CALCIUM: 8.4 mg/dL — ABNORMAL LOW (ref 8.7–10.4)
CHLORIDE: 110 mmol/L — ABNORMAL HIGH (ref 98–107)
CO2: 20.7 mmol/L (ref 20.0–31.0)
CREATININE: 1.77 mg/dL — ABNORMAL HIGH (ref 0.60–1.10)
EGFR CKD-EPI NON-AA MALE: 47 mL/min/{1.73_m2}
GLUCOSE RANDOM: 90 mg/dL (ref 70–179)
POTASSIUM: 3.4 mmol/L — ABNORMAL LOW (ref 3.5–5.1)
SODIUM: 141 mmol/L (ref 135–145)

## 2019-06-24 LAB — BILIRUBIN TOTAL: Bilirubin:MCnc:Pt:Ser/Plas:Qn:: 0.6

## 2019-06-24 LAB — URINALYSIS WITH CULTURE REFLEX
BILIRUBIN UA: NEGATIVE
GLUCOSE UA: NEGATIVE
KETONES UA: NEGATIVE
LEUKOCYTE ESTERASE UA: NEGATIVE
NITRITE UA: NEGATIVE
PH UA: 6 (ref 5.0–9.0)
PROTEIN UA: NEGATIVE
RBC UA: <1 /HPF (ref 0–3)
SPECIFIC GRAVITY UA: 1.015 (ref 1.005–1.030)
SQUAMOUS EPITHELIAL: <1 /HPF (ref 0–5)
UROBILINOGEN UA: 0.2

## 2019-06-24 LAB — AST (SGOT): Aspartate aminotransferase:CCnc:Pt:Ser/Plas:Qn:: 13

## 2019-06-24 LAB — PLATELET COUNT: Platelets:NCnc:Pt:Bld:Qn:Automated count: 329

## 2019-06-24 LAB — CALCIUM: Calcium:MCnc:Pt:Ser/Plas:Qn:: 8.4 — ABNORMAL LOW

## 2019-06-24 LAB — ALT (SGPT): Alanine aminotransferase:CCnc:Pt:Ser/Plas:Qn:: 22

## 2019-06-24 LAB — UROBILINOGEN UA: Lab: 0.2

## 2019-06-24 NOTE — Unmapped (Signed)
Duration of intervention: 10 minutes    Social Work Museum/gallery exhibitions officer    1. Patient income level: <300%FPL - Eligible          2. Patient insurance: Medicaid [name] is eligible for this ride since the appointment wasn't scheduled in time to schedule Medicaid transportation)    *If the patient has Medicaid or Employer based insurance, they are not eligible for the [employer], RSP    3.  Has the patient completed RW enrollment paperwork: Yes      *If no, have the patient see a Artist before they can use RSP)    4.  What local transportation options has the patient tried to access and what were the results? Warren [name]' neighbor takes him to appointments normally, the neighbor wasn't available today    5.  Are there any other transportation options in the patient???s local area: No    6.  The patient is Eligible for the Ride Share Program.     If patient meets above criteria and there are no alternative options for transportation, then the patient can receive assistance through the RSP.      Pt has documented failure of use of other personal, public, or insurance based transportation (can't book Medicaid transportation on late notice). Pt denied positive COVID test, symptoms, or exposure in the past two weeks.       Eloy Fehl Veverly Fells, CCM

## 2019-06-24 NOTE — Unmapped (Signed)
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     Relevant results:    Date 06/02/19  (inpt) 06/09/19 06/17/19 06/23/19      WBC 5.4 6.9 3.8 3.9      H&H 11.8/40.0 11.2/34.9 12.6/42.9 12.0/37.3      Plt 382 293 326 364      ANC 4.2 (4/27) 3.8 2.4 2.0      ALC 1.3 (4/27) 2.0 0.9 1.2      EOS 0.1 (4/27) 0.4 0.2 0.3      BUN 10 11 15 19       SCr 1.23 1.54 2.00 2.17      K + 4.1 4.5 3.9 4.0      AST 27 (5/3) 12 24 21       ALT 23 (5/3) 8 17 29       Mg 2.2 2.4 // //      [name] 0.7 (5/10) 0.7 // //                            5/19: Labs stable from discharge. Small bump in Scr. Called patient who reports that water intake is about two average size water bottles a day. Encouraged patient to increase fluids intake to include an extra bottle of water at least . He agreed and will do so. He denies any new symptoms. He states that he stopped the [name] yesterday as directed but was unable to pick up Rifampin till yesterday due to pharmacy not having his insurance information. He took his first dose today. He understands to take the medication three times a day, stressed importance of taking Rifampin consistently in conjunction with [name]. Verbalized understanding and will do so. Denies any issues with PICC line. He was seen via video visit with [name] at [facility] on 06/04/19 and he reports that surgery is planned for him some time in June. Will continue to monitor labs and patient closely. Called [name] to discontinue [name] and Magnesium level and continue weekly CBC with diff and CMP.    5/26: Scr elevated from baseline called patient to assess fluid intake. Patient reported that he was drinking about 1 8 oz bottle of water a day. Educated the patient on the importance of adequate water intake and said he should aim for six  8 oz glasses a day. Patient reported that he understood and would increase water intake. Patient reported that the previous two shipment of medication there were IV bags that had to be discarded as they were damaged, message sent to [name] regarding this issue. All other labs stable    6/1: Patient seen in clinic for visit today and RN unable to draw labs from the PICC line and slow flow with IV push. Patient unable to stay in clinic today for Alteplase and peripheral blood draw. Left message with [name] to let them know that Alteplase is being sent to patient's home today.     6/2: Patient seen again in clinic today. See note from 06/24/19. Noted slow increase of Scr despite patient's increased water intake. Discussed patient and  antibiotic therapy with [name] and [name], [name], and per consult, it is possible that [name] is causing this increase in creatine and it would be prudent to change IV therapy to Cefazolin 2g Q8H. [name], RN in to see patient and did teaching about Cefazolin administration. Discussed this with patient who is amenable to plan. Also arranged for infusion of normal saline to help with hydration. Patient received infusion today. Will extend antibiotics till 07/09/19 to coincide with appointment with Internal Medicine. [name], [name] is arranging for shipment of Cefazolin and for [facility] to give hands-on teaching with the patient on administration of Cefazolin.    Next scheduled labs: this week, date TBD, waiting to hear back from [name]. Otherwise next week for labs.    Action taken:  1. Continue IV [name] 12g continuous infusion for now until [name] nurse can provide teaching for Cefazolin infusion.  2. Continue PO Rifampin 300mg  TID (first dose 06/10/19 due to pharmacy issue)  3. Plan discussed with [name], clinical (histocompatibility and immunogenetics)) and Home Health agency  4. Time spent on documentation and coordination of care: 15 Minutes      [facility] OPAT Program  OPAT Program Phone: [phone]   OPAT Program Fax:  [phone]   [facility] ID Clinic Phone: [phone]

## 2019-06-24 NOTE — Unmapped (Signed)
Duration of Intervention: 10 min     SW called patient to help him get connected with lyft ride. Patient reports that he will be ready for lyft comes and confirmed coming to the 5th floor for a nursing appointment. SW will call medicaid transportation when patient gets here to coordinate future transportation. Patient will also be in medical case management with Francisca December.  He will need future transportation to internal medicine appointment. Provider, Lucita Lora, also requested an appointment with ID clinic on the same day.     Meade Hogeland LCSWA, LCAS  ID Clinic Social Work   Direct: [phone]

## 2019-06-24 NOTE — Unmapped (Signed)
1500 Pt is here for Hydration, is AAOX3 upon arrival, vitals checked and call bell at the chair side and pt instructed to use it when needed, pt verbalizes understanding  1505 PICC line- double lumen to the rt upper arm, good blood return with no resistance   1507 IV 0.9 NS started @ 999 ml/hr for 1 hr   1615 Iv fluids done and flushed the picc line followed by heparin 3 ml as per the protocol  1620 Pt given follow up instructions and is stable upon discharge

## 2019-06-24 NOTE — Unmapped (Signed)
Pt completed RW paperwork. Eligible for RW B&C grant services and Caps on Charges. IPL;FPL=109%. Expires 10/22/19     Kaitlyn Abshire  Benefits Coordinator, ID Clinic  Time of intervention: 10 mins

## 2019-06-24 NOTE — Unmapped (Unsigned)
[place] INFECTIOUS DISEASES CLINIC FOLLOW UP VISIT    Assessment/Recommendations:  Warren [name] is being seen in the Infectious Diseases Clinic for OPAT follow up and to establish HIV care.    ID Problem List:    1. 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 On 4/17 patient presented to OSH [facility]) with fevers/chillls/weakness with MSSA cultures positive. TEE on 4/21 showed a 2x2 cm vegetation on the non-coronary cusp on the aortic valve with mild-mod AI.  ?? Treated with Vancomycin and Cefepime, then narrowed to Cefazolin.  ?? 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 Pacemaker evaluation showed Medtronic Azure Dual Chamber Pacemaker, EKG &??telemetry demonstrating a-sensed, v-paced rhythm. Repeat TTE 5/4 showed Paravalvular echo lucency measuring approximately 2cm wide as previously seen on TEE. It extends 180 degrees along the right border of the homograft. There appears to be dehiscence and rocking of the aortic homograft. His status is tenuous with extremely high risk of decompensation &??death should the graft fail.   ?? Transferred to [name] on 05/26/19, antibiotics changed to [name] 2 g q 4h x 6 weeks (05/26/19-07/05/19), Gentamicin x 2 weeks (05/26/19-06/13/19, completed) and Rifampin 300mg  q8h x 6 weeks (05/26/19-07/05/19)    2. 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 Complicated hospital course in 02/2018, see records in media tab for details. Summary below:  ?? 03/20/18: S/p aortic root replacement with homograft 25mm, reconstruction of aortomitral continuity, repair of right atrial fistula, mitral valve repair, Epicardial pacemaker placed for bradycardia   ?? History of bicuspid aortic valve with mod-severe AI  ?? AV vegetations found on TEE, blood cultures grew S. Pneumoniae  ?? Treated initially with Vancomycin and Ceftriaxone x 2 weeks to cover for possible meningitis as well and discharged to rehab on Ceftriaxone 2g IV daily x 4 weeks.     Recommendations: (see HIV plan under HIV heading)  ?? Due to concern for possible acute interstitial nephritis (AIN), will stop [name] and start Cefazolin 2g Q8H IV. Patient has one bag of [name] on hand to infuse for 06/25/19. Patient is to continue infusing [name] till [name] can send nurse to do hands-on teaching on use of Cefazolin. Warren [name], [name] able to arrange shipment of new medication and for [name] nurse to come on 06/25/19 at around 4pm for teaching.  ?? Brief teaching of Cefazolin use by T. Hayes Ludwig, RN  ?? Extended Cefazolin to 07/09/19 to coincide with Internal Medicine appointment, anticipate end of therapy and PICC line removal on that day, barring any issues.  ?? Advised patient to consult with Warren [name] office at [name] regarding any activity restrictions, if any. According to Epic, patient has surgery scheduled on 07/21/19 for redo ASCENDING AORTA GRAFT, WITH CARDIOPULMONARY BYPASS, WITH AORTIC ROOT REPLACEMENT USING VALVED CONDUIT AND CORONARY RECONSTRUCTION (EG,BENTALL), redo sternotomy s/p Root replacement Feb 2020 .    Other ID problems:  HIV Care Plan:    Diagnoses and all orders for this visit:    Asymptomatic HIV infection (CMS-HCC)  -  Hepatitis B Core Antibody, total  -     Hepatitis B Surface Antibody  -     ALT  -     AST  -     Bilirubin, total  -     Basic Metabolic Panel  -     CBC w/ Differential  -     HIV RNA, Quantitative, PCR  -     Lymphocyte Markers Limited  -     Genosure Integrase  -     HIV Genotyping  - Urinalysis with Culture Reflex; Future  -     Urinalysis with Culture Reflex  -     Urine Culture    AKI (acute kidney injury) (CMS-HCC)      HIV  Doing well overall. Fills ART via Medicaid.   ?? According to notes, patient was diagnosed in 2008 after routine STI testing.   ?? Denies prior opportunistic infections.  ?? Has been non adherent with ART in the past. Last taking Symtuza.  ?? 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  ?? Patient has been out of care since moving to [place] from [place] last year in 06/2018.  ?? Switching to Dolutegravir 50mg  BID and Truvada daily. Meds waiting in [facility] pharmacy.  ?? Checking CD4, HIV RNA, & safety labs (full return).  ?? Discussed specific strategies to improve ARV adherence. Warren [name], [name] will call patient on 06/25/19 to make sure he is taking ART correctly since it is a new regimen.  ?? No need for OI prophylaxis as CD4= 264/24% on 06/24/19  ?? Keychain given to patient so that he can carry his medications with him. He declines pill box today.  ?? Need to work with pharmacy and social work to formulate consistent plan for patient to travel to [place] to pick up meds.    Lab Results   Component Value Date    ACD4 264 (L) 06/24/2019    CD4 24 (L) 06/24/2019    HIVCP <40 (H) 05/26/2019    HIVRS Detected (A) 05/26/2019     Sexual health & secondary prevention  Sex with women. Monogamous with single partner, although patient reports that his partner likely has outside partners. He does disclose status. Sometimes uses condoms.    Lab Results   Component Value Date    CTNAA Negative 06/23/2019    GCNAA Negative 06/23/2019    SPECTYPE Urine 06/23/2019    SPECSOURCE Urine 06/23/2019     ?? GC/CT NAATs -- tested on 06/23/19  ?? RPR -- NR 05/15/19 - repeat 1Y    Health maintenance  PCP: Has appointment with Warren [name] on 07/09/2019 to establish care.  Lab Results   Component Value Date    CREATININE 1.77 (H) 06/24/2019    CHOL 134 05/25/2019    HDL 29 (L) 05/25/2019    LDL 54 (L) 05/25/2019    NONHDL 105 05/25/2019    TRIG 256 (H) 05/25/2019     Communicable diseases  # TB - Needed  # HCV - negative 05/12/19; rescreen w/Ab q1-2y  05/12/19 Hep A IgM NR  05/12/19 Hep B C IgM NR  Cancer screening  # Anorectal - not yet done  # Colorectal - SCREEN AGE 12+  # Liver - not applicable  # Lung - SCREEN AGE 43-80 IF >30 PY & CURRENT OR QUIT <15Y AGO  # Prostate - SCREEN AGE 15 HI-RISK, 50 OTW -- Q2-4Y    Cardiovascular disease  # The  10-year ASCVD risk score Denman George DC Montez Hageman., et al., 2013) is: 5.1%  - No aspirin  - On statin (Pravastatin)  - BP controlled  - Smoker, 0.5 PPD x 20 years, now down to 4-5 cig/day. Uninterested in smoking cessation at this time.      There is no immunization history on file for this patient.  ?? Screening ordered today: IGRA  ?? Immunizations ordered today: Declined CoVid vaccine today and none    COVID Education:  - Discussed the current COVID pandemic and strategies to avoid infection and what to do if the patient becomes symptomatic.  - Encouraged good hand washing (20 seconds), social distancing, limiting close personal contact - which may include new sexual partners or having multiple partners during this period.   - Discussed isolating at home but also ways to limit social isolation, such as continuing engagement with people either electronically or with safe distancing, and the ability to go outdoors alone or separated from others  - If the patient becomes ill with fever, respiratory illness, sudden loss of taste and smell, GI complaints - contact clinic for further instructions.  - Reasons to visit the emergency department include SOB, confusion, lightheadedness when standing.     Counseling services took more than 50% of today's visit time.  Counseled as documented above regarding medication adherence and need for recommended screening tests.    Disposition  Return to clinic 07/09/19 or sooner if needed.    Next visit:  ?? Needs release for HIV care, [facility] in [place]  ?? Revisit CoVid vaccine    Varney Daily, FNP-BC  [facility] Infectious Diseases Clinic at [facility]  [address] [place], [place] [address]    Phone: [phone]   Fax: [phone]        Subjective:      Chief Complaint   OPAT and HIV followup    HPI  Return patient visit for Warren [name], a 44 y.o. male. Patient returns to clinic today to pick up his ART and get labs drawn.     OPAT follow up: Patient was seen last night by [facility] and had no problems with using PICC line to draw blood. Patient has had no problems infusing [name]. Safety labs received with serum creatinine trending up to 2.17 from 06/23/19. Patient continues to report drinking large amounts of water. Denies any new symptoms since yesterday.      Past Medical History:   Diagnosis Date   ??? Bicuspid aortic valve     mod-severe AI   ??? Endocarditis 02/2018    S. Pneumoniae, Tx Vancomycin and Ceftriaxone x 2 weeks, then Ceftriaxone 2g daily x 4 weeks   ??? 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 05/26/19 [name] 12g continuous infusion+Rifampin 300mg  Q8H, 06/25/19 Change to Cefazolin 2g Q8H+Rifampin, TTE 5/4 showed Paravalvular echo lucency measuring approximately 2cm wide as previously seen on TEE. It extends 180 degrees along the right border of the homograft. Appears to be dehiscence and rocking of the aortic homograft.   ??? HIV disease (CMS-HCC) 2008    Dx. in [place]. Denies OI. History of nonadherence to ART. Last on Symtuza. Started on Dolutegravir 50mg  BID and Truvada daily.   ??? Myocardial infarction (CMS-HCC)     Feb 2020   ??? Stroke (CMS-HCC)        Medications and Allergies   Reviewed and updated today. See bottom of this visit's encounter summary for  details.  Current Outpatient Medications on File Prior to Visit   Medication Sig   ??? dolutegravir (TIVICAY) 50 mg Tab TABLET Take 1 tablet (50 mg total) by mouth Two (2) times a day. On 6/14, decrease to 50 mg (1 tablet) by mouth once daily   ??? emtricitabine-tenofovir, TDF, (TRUVADA) 200-300 mg per tablet Take 1 tablet by mouth daily.   ??? pravastatin (PRAVACHOL) 80 MG tablet Take 1 tablet (80 mg total) by mouth daily.   ??? rifAMPin (RIFADIN) 300 MG capsule Take 1 capsule (300 mg total) by mouth Three (3) times a day.   ??? sodium chloride 0.9 % SolP 250 mL with nafcillin 2 gram SolR 6 g Infuse 0.5 g/hr into a venous catheter continuous.     No current facility-administered medications on file prior to visit.     Medications:   Current antibiotics:  [name] 2g Q24h, administered around 1:30pm  Rifampin 300mg  Q8H, 6am, 2pm, 10pm  Symtuza  Has not started Pravastatin    Previous antibiotics:  Gentamicin  Vancomycin  Ceftriaxone    Current/Prior immunomodulators:  None    Allergies  No Known Allergies    Social History  Social History     Tobacco Use   ??? Smoking status: Current Every Day Smoker     Packs/day: 0.25     Types: Cigarettes   ??? Smokeless tobacco: Never Used   ??? Tobacco comment: 4 cigarettes per day   Substance Use Topics   ??? Alcohol use: Yes     Alcohol/week: 2.0 standard drinks     Types: 2 Cans of beer per week     Comment: daily     Family History  Family History   Problem Relation Age of Onset   ??? Mental illness Mother    ??? No Known Problems Father    ??? No Known Problems Sister    ??? No Known Problems Brother        Review of Systems  As per HPI. Remainder of 10 systems reviewed, negative.        Immunizations:    There is no immunization history on file for this patient.    Review of Systems:  10 systems reviewed and negative except as per HPI.       Objective:      There were no vitals taken for this visit.  Wt Readings from Last 3 Encounters:   06/23/19 (!) 102.2 kg (225 lb 3.2 oz)   06/02/19 99.4 kg (219 lb 3.2 oz)   05/09/19 86.2 kg (190 lb)     Devices: PICC line  Const looks well and attentive, alert, appropriate   Psych Appropriate affect. Eye contact good. Linear thoughts. Fluent speech.     Laboratory Data  Reviewed in Epic today, using Synopsis and Chart Review filters.    Lab Results   Component Value Date    CREATININE 1.77 (H) 06/24/2019    CHOL 134 05/25/2019    HDL 29 (L) 05/25/2019    LDL 54 (L) 05/25/2019    NONHDL 105 05/25/2019    TRIG 256 (H) 05/25/2019     Lab Results   Component Value Date    WBC 3.4 (L) 06/24/2019    NEUTROABS 1.6 (L) 06/24/2019    LYMPHSABS 1.1 06/24/2019    EOSABS 0.3 06/24/2019    HGB 10.9 (L) 06/24/2019    HCT 33.9 (L) 06/24/2019    PLT 329 06/24/2019       Lab Results  Component Value Date    NA 141 06/24/2019    K 3.4 (L) 06/24/2019    CL 110 (H) 06/24/2019    CO2 20.7 06/24/2019    BUN 17 06/24/2019    CREATININE 1.77 (H) 06/24/2019    CALCIUM 8.4 (L) 06/24/2019    MG 2.2 06/02/2019       Lab Results   Component Value Date    ALKPHOS 97 06/17/2019    BILITOT 0.6 06/24/2019    PROT 7.7 06/17/2019    ALBUMIN 4.0 06/17/2019    ALT 22 06/24/2019    AST 13 06/24/2019       No results found for: ESR, CRP      Serologies:  No results found for: CMVIGG, EBVIGG, HIVAGAB, HEPAIGG, HEPBSAG, HEPBSAB, HEPBCAB, HEPCAB, LABRPR, HSV1IGG, HSV2IGG, VZVIGG, RUBIG, TOXOIGG, QTBG, QFTTBAG, QFTNILVALUE, QFTMITOGEN, QFTAGMINNIL    Microbiology:    No results found for: ANACXNo results found for: ANACX    Studies:   05/26/19 Echocardiogram  05/26/19 Pacemaker Evaluation 06/02/2019       Lab Results   Component Value Date    ALKPHOS 97 06/17/2019    BILITOT 1.9 (H) 06/17/2019    PROT 7.7 06/17/2019    ALBUMIN 4.0 06/17/2019    ALT 17 06/17/2019    AST 24 06/17/2019       No results found for: ESR, CRP      Serologies:  No results found for: CMVIGG, EBVIGG, HIVAGAB, HEPAIGG, HEPBSAG, HEPBSAB, HEPBCAB, HEPCAB, LABRPR, HSV1IGG, HSV2IGG, VZVIGG, RUBIG, TOXOIGG, QTBG, QFTTBAG, QFTNILVALUE, QFTMITOGEN, QFTAGMINNIL    Microbiology:    No results found for: ANACXNo results found for: ANACX    Studies:       Smoking: 0.5PPD down to 4-5 cigs a day. Smoked x 20 years.    Alcohol: 2 beers a day    Illicit drug use: Marijuana,

## 2019-06-25 LAB — LYMPH MARKER LIMITED,FLOW
ABSOLUTE CD3 CNT: 803 {cells}/uL — ABNORMAL LOW (ref 915–3400)
ABSOLUTE CD4 CNT: 264 {cells}/uL — ABNORMAL LOW (ref 510–2320)
ABSOLUTE CD8 CNT: 506 {cells}/uL (ref 180–1520)
CD3% (T CELLS)": 73 % (ref 61–86)
CD4% (T HELPER)": 24 % — ABNORMAL LOW (ref 34–58)

## 2019-06-25 LAB — HEPATITIS B SURFACE ANTIBODY: HEPATITIS B SURFACE ANTIBODY: NONREACTIVE

## 2019-06-25 LAB — ABSOLUTE CD4 CNT: Cells.CD3+CD4+:NCnc:Pt:XXX:Qn:: 264 — ABNORMAL LOW

## 2019-06-25 LAB — HEPATITIS B SURFACE ANTIBODY QUANT: Hepatitis B virus surface Ab:ACnc:Pt:Ser:Qn:: <8

## 2019-06-25 LAB — HEPATITIS B CORE TOTAL ANTIBODY: Hepatitis B virus core Ab:PrThr:Pt:Ser/Plas:Ord:IA: NONREACTIVE

## 2019-06-25 NOTE — Unmapped (Signed)
Duration of Intervention:15 min    SW called [facility] DSS on behalf of patient and with patient permission to help him get set up with medicaid transportation.  Patient provider informed SW that patient has appointments at [facility] that day. Patient has appointment with ID clinic at 11am and with Dr. Brooke Dare in internal medicine at 1:15pm.  Representative from DSS did not answer. Social Worker left a message for a call back to set up medicaid transportation for patient.     Representative from DSS called back and agreed to put in application for medicaid transportation.  Representative informed this Child psychotherapist that they will only transport to [place] on Fridays for appointment times between 9 am and 12pm and will need 3 days in advance to schedule the ride. To schedule a ride, patient or SW will need to call [employer] at [phone].      Representative also provided this Child psychotherapist with someone who helps with volunteer rides and provided contact information for her. Ezequiel Kayser at [phone]. Representative informed this Child psychotherapist that this transportation isn't as reliable.     DSS representative was going to complete application and fax form to clinic so that patient can get approval for transportation to [place] clinics.     Johnthan Axtman LCSWA, LCAS  ID Clinic Social Work   Direct: [phone]

## 2019-06-25 NOTE — Unmapped (Signed)
Error

## 2019-06-25 NOTE — Unmapped (Signed)
Called to make sure Warren [name] is clear on how to take his medications due to the many distractions he had while I was onboarding him.    Corliss Skains. [place], [place].D.  Specialty Pharmacist  [facility]  [phone] option [phone]

## 2019-06-26 LAB — HIV RNA, QUANTITATIVE, PCR

## 2019-06-26 LAB — HIV RNA QNT RSLT: HIV 1 RNA:PrThr:Pt:Ser/Plas:Ord:Probe.amp.tar: NOT DETECTED

## 2019-06-26 NOTE — Unmapped (Signed)
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     Relevant results:    Date 06/02/19  (inpt) 06/09/19 06/17/19 06/23/19 06/25/19 06/25/19    WBC 5.4 6.9 3.8 3.9 3.4 4.5    H&H 11.8/40.0 11.2/34.9 12.6/42.9 12.0/37.3 10.9/33.9 11.5/36.7    Plt 382 293 326 364 329 378    ANC 4.2 (4/27) 3.8 2.4 2.0 1.6 1.9    ALC 1.3 (4/27) 2.0 0.9 1.2 1.1 1.7    EOS 0.1 (4/27) 0.4 0.2 0.3 0.3 0.3    BUN 10 11 15 19 17  //    SCr 1.23 1.54 2.00 2.17 1.77 //    K + 4.1 4.5 3.9 4.0 3.4 //    AST 27 (5/3) 12 24 21 13  //    ALT 23 (5/3) 8 17 29 22  //    Mg 2.2 2.4 // // // //    [name] 0.7 (5/10) 0.7 // // // //                          5/19: Labs stable from discharge. Small bump in Scr. Called patient who reports that water intake is about two average size water bottles a day. Encouraged patient to increase fluids intake to include an extra bottle of water at least . He agreed and will do so. He denies any new symptoms. He states that he stopped the [name] yesterday as directed but was unable to pick up Rifampin till yesterday due to pharmacy not having his insurance information. He took his first dose today. He understands to take the medication three times a day, stressed importance of taking Rifampin consistently in conjunction with [name]. Verbalized understanding and will do so. Denies any issues with PICC line. He was seen via video visit with [name] at [facility] on 06/04/19 and he reports that surgery is planned for him some time in June. Will continue to monitor labs and patient closely. Called [name] to discontinue [name] and Magnesium level and continue weekly CBC with diff and CMP.    5/26: Scr elevated from baseline called patient to assess fluid intake. Patient reported that he was drinking about 1 8 oz bottle of water a day. Educated the patient on the importance of adequate water intake and said he should aim for six  8 oz glasses a day. Patient reported that he understood and would increase water intake. Patient reported that the previous two shipment of medication there were IV bags that had to be discarded as they were damaged, message sent to [facility] regarding this issue. All other labs stable    6/1: Patient seen in clinic for visit today and RN unable to draw labs from the PICC line and slow flow with IV push. Patient unable to stay in clinic today for Alteplase and peripheral blood draw. Left message with [facility] for [place] to let them know that Alteplase is being sent to patient's home today.     6/2: Patient seen again in clinic today. See note from 06/24/19. Noted slow increase of Scr despite patient's increased water intake. Discussed patient and  antibiotic therapy with [name] and [name], [name], and per consult, it is possible that [name] is causing this increase in creatine and it would be prudent to change IV therapy to Cefazolin 2g Q8H. [name], RN in to see patient and did teaching about Cefazolin administration. Discussed this with patient who is amenable to plan. Also arranged for infusion of normal saline to help with hydration. Patient received infusion today. Will extend antibiotics till 07/09/19 to coincide with appointment with Internal Medicine. [name], [name] is arranging for shipment of Cefazolin and for [facility] to give hands-on teaching with the patient on administration of Cefazolin on 06/25/19.    6/4: Received repeat CBC which is stable. Suspect sample drawn on 06/24/19 was a diluted sample or lab variation (samples processed at different labs).    Next scheduled labs: Next week    Action taken:  1. STOP [name] on 06/25/19.   2. START Cefazolin 2g Q8H on 06/26/19.  2. Continue PO Rifampin 300mg  TID (first dose 06/10/19 due to pharmacy issue)  3. Plan discussed with [place] [facility] Scientist, clinical (histocompatibility and immunogenetics)) and Home Health agency  4. Time spent on documentation and coordination of care: 15 Minutes      [facility] OPAT Program  OPAT Program Phone: [phone]   OPAT Program Fax:  [phone]   [facility] ID Clinic Phone: [phone]

## 2019-06-29 NOTE — Unmapped (Unsigned)
Duration of Intervention: 15 min     REASON/TYPE OF CONTACT: Phone, SW received message from internal medicine scheduling confirming that he was able to receive an appointment in internal medicine at [facility] on Friday, July 10, 2019.     ASSESSMENT:  Patient was scheduled through medicaid transportation, [employer], to arrive for appointment at 8:45am.  Patient discussed relief that transportation was set up for his appointments.  Patient will also need to see ID clinic after internal medicine appointment. Patient agreed that he would see ID clinic after internal medicine apointment. Patient is aware that transportation will pick him up at 11am.  Patient will be called by [employer] the day prior to remind of his transportation set up and discuss times he needed to be ready.       INTERVENTION:  SW called medicaid transportation company, [employer], and assisted patient in scheduling a ride to medical appointment. SW called patient to inform patient of plans regarding transportation. SW messaged ID provider and internal med provider to keep providers aware that patient will need to be ready to be picked up at 11am.     PLAN:  SW will continue to coordinate health plans for surgery transportation with [facility] cardioth[facility] surgery unit.     Shyloh Derosa LCSWA, LCAS  ID Clinic Social Work   Direct: [phone]

## 2019-06-30 NOTE — Unmapped (Addendum)
[facility] Pharmacist Intervention    Type of intervention: clarification of regimen with patient    Medication: Tivicay and Truvada    Problem: Patient was distracted during onboarding session on 06/23/19 and I wanted to make sure patient knew to take one tablet of the Tivicay twice daily until 06/14 and then drop down to 1 tablet daily    Intervention: Warren [name] said he has been taking the Tivicay incorrectly since his start date of 06/02.  He was taking two tablets twice daily. He said he was confused because we sent 2 bottles of Tivicay with the same directions and he thought that since their were two bottles, that he was supposed to take 1 from each twice daily.  He has already taken 2 tablets this morning and I told him to not take any more Tivicay today and starting tomorrow, take one tablet twice daily.  He has not noticed any adverse effects from taking this double dose. He has been taking the Truvada correctly.    Follow up needed: Yes.  Patient said his rifampin stop date may not be until 06/18 and wants to know if he is to continue bid dosing until then.    Approximate time spent: 15 minutes    Warren [name] [facility]   [facility] Specialty Pharmacist

## 2019-07-01 NOTE — Unmapped (Addendum)
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     Relevant results:    Date 06/02/19  (inpt) 06/09/19 06/17/19 06/23/19 06/25/19 06/25/19 06/30/19   WBC 5.4 6.9 3.8 3.9 3.4 4.5 5.4   H&H 11.8/40.0 11.2/34.9 12.6/42.9 12.0/37.3 10.9/33.9 11.5/36.7 11.5/32.4   Plt 382 293 326 364 329 378 412   ANC 4.2 (4/27) 3.8 2.4 2.0 1.6 1.9 3.3   ALC 1.3 (4/27) 2.0 0.9 1.2 1.1 1.7 1.4   EOS 0.1 (4/27) 0.4 0.2 0.3 0.3 0.3 0.2   BUN 10 11 15 19 17  // //   SCr 1.23 1.54 2.00 2.17 1.77 // //   K + 4.1 4.5 3.9 4.0 3.4 // //   AST 27 (5/3) 12 24 21 13  // //   ALT 23 (5/3) 8 17 29 22  // //   Mg 2.2 2.4 // // // // //   [name] 0.7 (5/10) 0.7 // // // // //                         5/19: Labs stable from discharge. Small bump in Scr. Called patient who reports that water intake is about two average size water bottles a day. Encouraged patient to increase fluids intake to include an extra bottle of water at least . He agreed and will do so. He denies any new symptoms. He states that he stopped the [name] yesterday as directed but was unable to pick up Rifampin till yesterday due to pharmacy not having his insurance information. He took his first dose today. He understands to take the medication three times a day, stressed importance of taking Rifampin consistently in conjunction with [name]. Verbalized understanding and will do so. Denies any issues with PICC line. He was seen via video visit with [name] at [facility] on 06/04/19 and he reports that surgery is planned for him some time in June. Will continue to monitor labs and patient closely. Called [name] to discontinue [name] and Magnesium level and continue weekly CBC with diff and CMP.    5/26: Scr elevated from baseline called patient to assess fluid intake. Patient reported that he was drinking about 1 8 oz bottle of water a day. Educated the patient on the importance of adequate water intake and said he should aim for six  8 oz glasses a day. Patient reported that he understood and would increase water intake. Patient reported that the previous two shipment of medication there were IV bags that had to be discarded as they were damaged, message sent to [facility] regarding this issue. All other labs stable    6/1: Patient seen in clinic for visit today and RN unable to draw labs from the PICC line and slow flow with IV push. Patient unable to stay in clinic today for Alteplase and peripheral blood draw. Left message with [facility] for [place] to let them know that Alteplase is being sent to patient's home today.     6/2: Patient seen again in clinic today. See note from 06/24/19. Noted slow increase of Scr despite patient's increased water intake. Discussed patient and  antibiotic therapy with [name] and [name], [name], and per consult, it is possible that [name] is causing this increase in creatine and it would be prudent to change IV therapy to Cefazolin 2g Q8H. [name], RN in to see patient and did teaching about Cefazolin administration. Discussed this with patient who is amenable to plan. Also arranged for infusion of normal saline to help with hydration. Patient received infusion today. Will extend antibiotics till 07/09/19 to coincide with appointment with Internal Medicine. [name], [name] is arranging for shipment of Cefazolin and for [facility] to give hands-on teaching with the patient on administration of Cefazolin on 06/25/19.    6/4: Received repeat CBC which is stable. Suspect sample drawn on 06/24/19 was a diluted sample or lab variation (samples processed at different labs).    6/9: Received CBC w diff, stable. Spoke with [place] from [employer]. HH nurse did not draw CMP, she is checking now to find a nurse who can go out today to draw the blood. Patient also will stay on antibiotics till 07/10/19 when Medicaid transportation can bring him to [facility] for PCP and ID appointment. Informed [place] of new expected EOT date of 07/10/19.    Next scheduled labs: This week for CMP    Action taken:  1. Conitnue Cefazolin 2g Q8H till EOT appointment on 07/10/19  2. Continue PO Rifampin 300mg  TID (first dose 06/10/19 due to pharmacy issue)  3. Plan discussed with [place] HCS Scientist, clinical (histocompatibility and immunogenetics)) and Home Health agency  4. Time spent on documentation and coordination of care: 15 Minutes      [facility] OPAT Program  OPAT Program Phone: [phone]   OPAT Program Fax:  [phone]   [facility] ID Clinic Phone: [phone]

## 2019-07-07 NOTE — Unmapped (Unsigned)
Medical Case Management Social Work Note     Duration of Intervention: 15 minutes    SW spoke to lawyer Ofilia Neas at [facility] re: issues with scheduling Medicaid transportation for pt surgery at [facility] on Sunday, June 27th. Revonda Standard recommended we talk to the [facility] supervisor for [employer] as the [employer] third party transportation agency [employer] is refusing to transport unless Friday morning 9-12. Revonda Standard advised we could ask them to provide taxi voucher to pt if not EMS or other transport. [facility] had already said they cannot admit on the Friday morning due to bed space. Revonda Standard advised if this doesn't work, requesting formal denial and appeal process.     SW left a voicemail with Jamelle Haring, Adult Medicaid Supervisor at [facility] at [phone]    SW sent message to [facility] social worker Flo Shanks to see if surgeon Dr.Chen's administrative assistant Sherre Lain had sent in [place] out of county exemption form for the [facility] surgery.   Nate responded:   The DMA 5048 was faxed over to Roque Lias (spelling?) over at [employer]. DSS Medicaid Transportation. She reached out to the state contact to see if Medicaid would cover a local hotel room, due to lack of weekend transportation, but that was, unfortunately, denied.   She has reached out to [place] who coordinates the volunteer drivers, but they only had 1 real volunteer for weekends, and due to Covid they were no longer able to use that volunteer.     Has he found any means to get to [facility] on Sunday? I plan on giving him a call later this week, early next just to touch base.   It looks like he actually has a Covid Testing drive through appointment at 2:30pm in [place], and would then need to get to the hospital for admission after that appointment.     SW informed Harrold Donath we left message for [employer] supervisor for taxi voucher or EMS. This SW asked if the pt had to go through drive through Covid testing before appt or if this could be done at admission as this could provide another layer of complication to transportation. No response yet.     SW called pt to update him but no answer, left voicemail.     Next Steps: SW will f/u with Raynelle Fanning again tomorrow if message not returned.     Bradly Bienenstock LCSW, CHES  ID Clinic Social Work   Direct: [phone]  Main ID: [id number]

## 2019-07-07 NOTE — Unmapped (Incomplete)
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     Relevant results:    Date 06/02/19  (inpt) 06/09/19 06/17/19 06/23/19 06/25/19 06/25/19 06/30/19    WBC 5.4 6.9 3.8 3.9 3.4 4.5 5.4    H&H 11.8/40.0 11.2/34.9 12.6/42.9 12.0/37.3 10.9/33.9 11.5/36.7 11.5/32.4    Plt 382 293 326 364 329 378 412    ANC 4.2 (4/27) 3.8 2.4 2.0 1.6 1.9 3.3    ALC 1.3 (4/27) 2.0 0.9 1.2 1.1 1.7 1.4    EOS 0.1 (4/27) 0.4 0.2 0.3 0.3 0.3 0.2    BUN 10 11 15 19 17  // 20    SCr 1.23 1.54 2.00 2.17 1.77 // 1.77    K + 4.1 4.5 3.9 4.0 3.4 // 3.7    AST 27 (5/3) 12 24 21 13  // //    ALT 23 (5/3) 8 17 29 22  // //    Mg 2.2 2.4 // // // // //    [name] 0.7 (5/10) 0.7 // // // // //                            5/19: Labs stable from discharge. Small bump in Scr. Called patient who reports that water intake is about two average size water bottles a day. Encouraged patient to increase fluids intake to include an extra bottle of water at least . He agreed and will do so. He denies any new symptoms. He states that he stopped the [name] yesterday as directed but was unable to pick up Rifampin till yesterday due to pharmacy not having his insurance information. He took his first dose today. He understands to take the medication three times a day, stressed importance of taking Rifampin consistently in conjunction with [name]. Verbalized understanding and will do so. Denies any issues with PICC line. He was seen via video visit with [name] at [facility] on 06/04/19 and he reports that surgery is planned for him some time in June. Will continue to monitor labs and patient closely. Called [name] to discontinue [name] and Magnesium level and continue weekly CBC with diff and CMP.    5/26: Scr elevated from baseline called patient to assess fluid intake. Patient reported that he was drinking about 1 8 oz bottle of water a day. Educated the patient on the importance of adequate water intake and said he should aim for six  8 oz glasses a day. Patient reported that he understood and would increase water intake. Patient reported that the previous two shipment of medication there were IV bags that had to be discarded as they were damaged, message sent to [facility] regarding this issue. All other labs stable    6/1: Patient seen in clinic for visit today and RN unable to draw labs from the PICC line and slow flow with IV push. Patient unable to stay in clinic today for Alteplase and peripheral blood draw. Left message with [facility] for [place] to let them know that Alteplase is being sent to patient's home today.     6/2: Patient seen again in clinic today. See  note from 06/24/19. Noted slow increase of Scr despite patient's increased water intake. Discussed patient and antibiotic therapy with [name] and [name], [name], and per consult, it is possible that [name] is causing this increase in creatine and it would be prudent to change IV therapy to Cefazolin 2g Q8H. [name], RN in to see patient and did teaching about Cefazolin administration. Discussed this with patient who is amenable to plan. Also arranged for infusion of normal saline to help with hydration. Patient received infusion today. Will extend antibiotics till 07/09/19 to coincide with appointment with Internal Medicine. [name], [name] is arranging for shipment of Cefazolin and for [facility] to give hands-on teaching with the patient on administration of Cefazolin on 06/25/19.    6/4: Received repeat CBC which is stable. Suspect sample drawn on 06/24/19 was a diluted sample or lab variation (samples processed at different labs).    6/9: Received CBC w diff, stable. Spoke with [place] from [employer]. HH nurse did not draw CMP, she is checking now to find a nurse who can go out today to draw the blood. Patient also will stay on antibiotics till 07/10/19 when Medicaid transportation can bring him to [facility] for PCP and ID appointment. Informed [place] of new expected EOT date of 07/10/19.    6/10: Received some lab results from 06/30/19. AST/ALT to follow. Scr still elevated but stable. Expect labs early next week.     Next scheduled labs: Next week.    Action taken:  1. Conitnue Cefazolin 2g Q8H till EOT appointment on 07/10/19  2. Continue PO Rifampin 300mg  TID (first dose 06/10/19 due to pharmacy issue)  3. Plan discussed with [place] HCS Scientist, clinical (histocompatibility and immunogenetics)) and Home Health agency  4. Time spent on documentation and coordination of care: 15 Minutes      [facility] OPAT Program  OPAT Program Phone: [phone]   OPAT Program Fax:  [phone]   [facility] ID Clinic Phone: [phone]

## 2019-07-08 NOTE — Unmapped (Signed)
Medical Case Management Social Work Note     Duration of Intervention: 30 minutes    SW received call this morning from [place] at [facility] transportation stating they talked with [facility] social worker Flo Shanks recently and he stated they could get a taxi through [employer]. Eunice Blase stated they would have no way to get pt to the appointment. SW emailed Harrold Donath re: this and [employer] back:     I have spoken with Eunice Blase, and yes if another option, such as the taxi voucher you had been pursuing isn???t available, or if after I speak with him he does not have any natural supports, than I can secure a [facility] sponsored taxi to his surgery, I also am still looking into whether or not I can get the covid test appointment moved to the main hospital, before I can confirm a taxi.    If when you spoke with Eunice Blase, the taxi voucher from [employer]???t available then when I have confirmation on the location of the covid test, I???ll reach out to Mr. Robarts and move forward with the [employer].    SW spoke to [place] at [facility] again and they stated there is a Merchandiser, retail this morning to see if they can allow use of a volunteer driver for this pt for the surgery though in the past year, supervisor had refused to allow use of the volunteers due to covid. Eunice Blase stated they do not have a way to provide a taxi voucher through [facility] and she will call me back after this meeting has concluded. SW emailed update to [place] who responded that he will give the pt a call Thursday or Friday to schedule a [facility] taxi if the volunteer Medicaid driver falls through. Harrold Donath will also reach out to providers and staff assistant to see if Friday appointments for surgery aftercare can be accommodated so pt can use [employer].     SW received call later this morning from Debbie at [facility] stating that their department approved for volunteer drivers to work again and they will be able to find a volunteer to bring this pt to his heart surgery at [facility] on Sunday June 17th. SW urged Debbie to call Harrold Donath for details on time and address for the volunteer. Ezequiel Kayser is Energy manager [phone] and Eunice Blase is [phone].     Bradly Bienenstock LCSW, CHES  ID Clinic Social Work   Direct: [phone]  Main ID: [id number]

## 2019-07-08 NOTE — Unmapped (Signed)
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     Relevant results:    Date 06/02/19  (inpt) 06/09/19 06/17/19 06/23/19 06/25/19 06/25/19 06/30/19 07/07/19   WBC 5.4 6.9 3.8 3.9 3.4 4.5 5.4 4.5   H&H 11.8/40.0 11.2/34.9 12.6/42.9 12.0/37.3 10.9/33.9 11.5/36.7 11.5/32.4 12.2/37.2   Plt 382 293 326 364 329 378 412 308   ANC 4.2 (4/27) 3.8 2.4 2.0 1.6 1.9 3.3 2.6   ALC 1.3 (4/27) 2.0 0.9 1.2 1.1 1.7 1.4 1.3   EOS 0.1 (4/27) 0.4 0.2 0.3 0.3 0.3 0.2 0.2   BUN 10 11 15 19 17  // 20 16   SCr 1.23 1.54 2.00 2.17 1.77 // 1.77 1.40   K + 4.1 4.5 3.9 4.0 3.4 // 3.7 4.0   AST 27 (5/3) 12 24 21 13  // // 13   ALT 23 (5/3) 8 17 29 22  // // 4   Mg 2.2 2.4 // // // // // //   [name] 0.7 (5/10) 0.7 // // // // // //                           5/19: Labs stable from discharge. Small bump in Scr. Called patient who reports that water intake is about two average size water bottles a day. Encouraged patient to increase fluids intake to include an extra bottle of water at least . He agreed and will do so. He denies any new symptoms. He states that he stopped the [name] yesterday as directed but was unable to pick up Rifampin till yesterday due to pharmacy not having his insurance information. He took his first dose today. He understands to take the medication three times a day, stressed importance of taking Rifampin consistently in conjunction with [name]. Verbalized understanding and will do so. Denies any issues with PICC line. He was seen via video visit with [name] at [facility] on 06/04/19 and he reports that surgery is planned for him some time in June. Will continue to monitor labs and patient closely. Called [name] to discontinue [name] and Magnesium level and continue weekly CBC with diff and CMP.    5/26: Scr elevated from baseline called patient to assess fluid intake. Patient reported that he was drinking about 1 8 oz bottle of water a day. Educated the patient on the importance of adequate water intake and said he should aim for six  8 oz glasses a day. Patient reported that he understood and would increase water intake. Patient reported that the previous two shipment of medication there were IV bags that had to be discarded as they were damaged, message sent to [facility] regarding this issue. All other labs stable    6/1: Patient seen in clinic for visit today and RN unable to draw labs from the PICC line and slow flow with IV push. Patient unable to stay in clinic today for Alteplase and peripheral blood draw. Left message with [facility] for [place] to let them know that Alteplase is being sent to patient's home today.     6/2: Patient seen again in clinic today. See  note from 06/24/19. Noted slow increase of Scr despite patient's increased water intake. Discussed patient and antibiotic therapy with [name] and [name], [name], and per consult, it is possible that [name] is causing this increase in creatine and it would be prudent to change IV therapy to Cefazolin 2g Q8H. [name], RN in to see patient and did teaching about Cefazolin administration. Discussed this with patient who is amenable to plan. Also arranged for infusion of normal saline to help with hydration. Patient received infusion today. Will extend antibiotics till 07/09/19 to coincide with appointment with Internal Medicine. [name], [name] is arranging for shipment of Cefazolin and for [facility] to give hands-on teaching with the patient on administration of Cefazolin on 06/25/19.    6/4: Received repeat CBC which is stable. Suspect sample drawn on 06/24/19 was a diluted sample or lab variation (samples processed at different labs). 6/9: Received CBC w diff, stable. Spoke with [place] from [employer]. HH nurse did not draw CMP, she is checking now to find a nurse who can go out today to draw the blood. Patient also will stay on antibiotics till 07/10/19 when Medicaid transportation can bring him to [facility] for PCP and ID appointment. Informed [place] of new expected EOT date of 07/10/19.    6/10: Received some lab results from 06/30/19. AST/ALT to follow. Scr still elevated but stable. Expect labs early next week.     6/16: Labs received from today. Labs stable, Scr improving. Will see patient on 07/10/19 for end of therapy evaluation.    Next scheduled labs: Next week.    Action taken:  1. Conitnue Cefazolin 2g Q8H till EOT appointment on 07/10/19  2. Continue PO Rifampin 300mg  TID (first dose 06/10/19 due to pharmacy issue)  3. Plan discussed with [place] HCS Scientist, clinical (histocompatibility and immunogenetics)) and Home Health agency  4. Time spent on documentation and coordination of care: 15 Minutes      [facility] OPAT Program  OPAT Program Phone: [phone]   OPAT Program Fax:  [phone]   [facility] ID Clinic Phone: [phone]

## 2019-07-10 ENCOUNTER — Ambulatory Visit: Admit: 2019-07-10 | Discharge: 2019-07-11 | Payer: MEDICAID

## 2019-07-10 DIAGNOSIS — Z131 Encounter for screening for diabetes mellitus: Principal | ICD-10-CM

## 2019-07-10 DIAGNOSIS — Z7289 Other problems related to lifestyle: Principal | ICD-10-CM

## 2019-07-10 DIAGNOSIS — N179 Acute kidney failure, unspecified: Principal | ICD-10-CM

## 2019-07-10 DIAGNOSIS — B9561 Methicillin susceptible Staphylococcus aureus infection as the cause of diseases classified elsewhere: Principal | ICD-10-CM

## 2019-07-10 DIAGNOSIS — R7881 Bacteremia: Principal | ICD-10-CM

## 2019-07-10 DIAGNOSIS — I330 Acute and subacute infective endocarditis: Principal | ICD-10-CM

## 2019-07-10 LAB — CBC W/ AUTO DIFF
BASOPHILS ABSOLUTE COUNT: 0 10*9/L (ref 0.0–0.1)
BASOPHILS RELATIVE PERCENT: 0.6 %
EOSINOPHILS ABSOLUTE COUNT: 0.2 10*9/L (ref 0.0–0.7)
HEMATOCRIT: 38.4 % (ref 38.0–50.0)
HEMOGLOBIN: 12.6 g/dL — ABNORMAL LOW (ref 13.5–17.5)
LYMPHOCYTES ABSOLUTE COUNT: 1.1 10*9/L (ref 0.7–4.0)
LYMPHOCYTES RELATIVE PERCENT: 26.9 %
MEAN CORPUSCULAR HEMOGLOBIN: 31.4 pg (ref 26.0–34.0)
MEAN CORPUSCULAR VOLUME: 96.1 fL — ABNORMAL HIGH (ref 81.0–95.0)
MEAN PLATELET VOLUME: 7 fL (ref 7.0–10.0)
MONOCYTES ABSOLUTE COUNT: 0.5 10*9/L (ref 0.1–1.0)
MONOCYTES RELATIVE PERCENT: 10.9 %
NEUTROPHILS ABSOLUTE COUNT: 2.4 10*9/L (ref 1.7–7.7)
NEUTROPHILS RELATIVE PERCENT: 57.7 %
NUCLEATED RED BLOOD CELLS: 0 /100{WBCs} (ref <=4)
PLATELET COUNT: 277 10*9/L (ref 150–450)
RED BLOOD CELL COUNT: 4 10*12/L — ABNORMAL LOW (ref 4.32–5.72)
RED CELL DISTRIBUTION WIDTH: 13.7 % (ref 12.0–15.0)
WBC ADJUSTED: 4.2 10*9/L (ref 3.5–10.5)

## 2019-07-10 LAB — BASIC METABOLIC PANEL
ANION GAP: 4 mmol/L (ref 3–11)
BLOOD UREA NITROGEN: 12 mg/dL (ref 9–23)
BUN / CREAT RATIO: 9
CALCIUM: 9.6 mg/dL (ref 8.7–10.4)
CO2: 24.9 mmol/L (ref 20.0–31.0)
CREATININE: 1.33 mg/dL — ABNORMAL HIGH (ref 0.60–1.10)
EGFR CKD-EPI AA MALE: 76 mL/min/{1.73_m2}
EGFR CKD-EPI NON-AA MALE: 66 mL/min/{1.73_m2}
GLUCOSE RANDOM: 89 mg/dL (ref 70–179)
POTASSIUM: 3.9 mmol/L (ref 3.5–5.1)
SODIUM: 135 mmol/L (ref 135–145)

## 2019-07-10 LAB — HEMOGLOBIN: Hemoglobin:MCnc:Pt:Bld:Qn:: 12.6 — ABNORMAL LOW

## 2019-07-10 LAB — EGFR CKD-EPI AA MALE: Glomerular filtration rate/1.73 sq M.predicted.black:ArVRat:Pt:Ser/Plas/Bld:Qn:Creatinine-based formula (CKD-EPI): 76

## 2019-07-10 LAB — ALT (SGPT): Alanine aminotransferase:CCnc:Pt:Ser/Plas:Qn:: <7 — ABNORMAL LOW

## 2019-07-10 LAB — AST (SGOT): Aspartate aminotransferase:CCnc:Pt:Ser/Plas:Qn:: 14

## 2019-07-10 MED ORDER — CEPHALEXIN 500 MG CAPSULE
ORAL_CAPSULE | Freq: Two times a day (BID) | ORAL | 0 refills | 12.00000 days | Status: CP
Start: 2019-07-10 — End: 2019-07-22

## 2019-07-10 NOTE — Unmapped (Signed)
Contacted patient via mobile phone and confirmed two identifiers. Discussed the following:  1.  Patient was seen by PCP this morning he was supposed to have an OPAT follow-up with me but for some reason that appointment had not been made.  At this time will discontinue IV antibiotics and start oral suppression with Keflex 500 mg twice daily until surgery on 07/21/2019.  Reiterated to patient that he will be stopping the oral rifampin and IV cefazolin as of today and [place], [facility] pharmacist, will send home health to pull his PICC line.  Told patient that he will start on Keflex 500 mg twice daily until surgery and he requests that it be sent to local [facility].  He was amenable to this plan.  I asked him specifically if it was okay to have antibiotics such as Keflex sent to his house and he was okay with that.  He does not want his HIV medications shipped to his home.  2.  Patient asked about reaching out to [facility] homecare regarding PICC line removal.  I gave him the number to [facility] home care and he is to call the home health agency.  I did reassure him that the pharmacist that [facility] will help arrange this as well.

## 2019-07-10 NOTE — Unmapped (Addendum)
Thank you for coming today!    Let me know if you have any other questions about the covid vaccine.  Let me know if/when you received the Tdap vaccine and the pneumonia vaccine (pneumovax). You can call the clinic with this information.

## 2019-07-10 NOTE — Unmapped (Signed)
[facility] at [facility]       Type of visit:  face to face    Reason for visit: establish care    Questions / Concerns that need to be addressed:      General Consent to Treat (GCT) for non-epic video visits only: Verbal consent        Screening BP- 99/70                Allergies reviewed: Yes    Medication reviewed: Yes  Pended refills? Yes        HCDM reviewed and updated in Epic:    We are working to make sure all of our patients??? wishes are updated in Epic and part of that is documenting a Environmental health practitioner for each patient  A Health Care Decision Rodena Piety is someone you choose who can make health care decisions for you if you are not able ??? who would you most want to do this for you????  is already up to date.                           If no: Are you interested in scheduling? Declines vaccine

## 2019-07-10 NOTE — Unmapped (Signed)
Internal Medicine Initial Visit        Assessment/Plan:     Kanishk Stroebel presents today to establish care.    Bobbie was seen today for annual exam.    Diagnoses and all orders for this visit:    Bacteremia due to Staphylococcus aureus  Acute bacterial endocarditis  Finishing 6 weeks of IV abx today. Follows closely with ID. Is having cardiac surgery at end of month at [facility].  -     Ambulatory referral to Internal Medicine  -     CBC w/ Differential  -     AST  -     ALT    Other problems related to lifestyle  Routine.   -     Hepatitis C Antibody    Screening for diabetes mellitus  Routine.   -     Hemoglobin A1c    AKI (acute kidney injury) (CMS-HCC)  Cr elevated at last check. Could have been 2/2 drug drug interactions w/ HIV meds and rifampin. U/A was normal.  -     Basic metabolic panel          Return in about 6 months (around 01/09/2020).      Chief Complaint:      Keian Odriscoll is a 44 y.o. male who presents to Establish Care.      Subjective:     HPI    44 year old male w/ HIV and history of endocarditis. No other medical issues. Has had valve repair, no other surgeries.    Seen in ID clinic on 6/1. Most recent episode of endocarditis was in may - completed 6 weeks of naficillin and rifampin with end date 6/17. Started dolutegravir + truvada, stopped symtuza. Has surgery on 6/29 at [facility].    No chest pain, LH, DZ, SOB    Discussed covid vaccine    Foster child so not sure of fam hx    Smokes cigarettes. < 0.5 ppd  X 20 years.  Has quit when hospitalized.   2 beers a day  Occasional marijuana use    Originally from [place]. Moved here for change of pace.    Data review  06/2019 labs: WBC 3.4, Hgb 10.9, K 3.4, Cr 1.77, GFR 47, normal liver enzymes  05/2019 labs: LDL 54, HDL 29    The past medical history, surgical history, family history, social history, medications and allergies were reviewed in Epic.     Review of Systems    The balance of 10 systems was reviewed and unremarkable except as stated above.        Objective:     BP 99/70 (BP Site: L Arm)  - Pulse 99  - Temp 36.4 ??C (97.5 ??F) (Temporal)  - Ht 184 cm (6' 0.44)  - Wt 98.4 kg (217 lb)  - SpO2 99%  - BMI 29.07 kg/m??   General: Well-appearing male sitting in chair in NAD.  HEENT: [place]/AT. Sclera anicteric.  Cardiovascular: RRR. No murmurs appreciated.  Pulmonary: Normal WOB. CTAB.  Gastrointestinal: Soft, non-tender, non-distended.  Extremities: No lower extremity edema. PICC line in RUE.  Skin: No suspicious rashes or lesions.    Records Review - completed    Medication adherence and barriers to the treatment plan have been addressed. Opportunities to optimize healthy behaviors have been discussed. Patient / caregiver voiced understanding.      Jeffie Pollock, MD

## 2019-07-10 NOTE — Unmapped (Signed)
Medical Case Management Social Work Note     Duration of Intervention: 15 minutes    SW met with pt in person following internal med appointment. Pt had thought we had called him to discuss something with him but I couldn't find anything in the chart. SW informed pt Medicaid found volunteer driver to take him to surgery next Sunday and they should call him with pick up times for that soon. Pt called [facility] to let them know he is ready to go home however their Zenaida Niece broke down. SW provided [place] transportation so that pt can get back home safely. Pt has documented failure of use of other personal, public, or insurance based transportation. Pt denied positive COVID test, symptoms, or exposure in the past two weeks.      Bradly Bienenstock LCSW, CHES  ID Clinic Social Work   Direct: [phone]  Main ID: [phone]

## 2019-07-11 NOTE — Unmapped (Signed)
ERROR

## 2019-07-12 LAB — HEMOGLOBIN A1C: Hemoglobin A1c/Hemoglobin.total:MFr:Pt:Bld:Qn:: <4 — ABNORMAL LOW

## 2019-07-12 LAB — HEPATITIS C ANTIBODY: Hepatitis C virus Ab:PrThr:Pt:Ser:Ord:: NONREACTIVE

## 2019-07-13 NOTE — Unmapped (Signed)
Called with lab results. Normal A1c, liver enzymes. Hgb mildly low but improved from prior. AKI improved, Cr almost back to normal. Negative hep C.

## 2019-07-15 DIAGNOSIS — Z21 Asymptomatic human immunodeficiency virus [HIV] infection status: Principal | ICD-10-CM

## 2019-07-15 MED ORDER — TIVICAY 50 MG TABLET
ORAL_TABLET | Freq: Two times a day (BID) | ORAL | 2 refills | 30.00000 days | Status: CN
Start: 2019-07-15 — End: ?

## 2019-07-15 NOTE — Unmapped (Signed)
[facility] Clinical Assessment & Refill Coordination Note    Warren [name], DOB: Sep 22, 1975  Phone: [phone] (home)     All above HIPAA information was verified with patient.     Was a Nurse, learning disability used for this call? No    Specialty Medication(s):   Infectious Disease: emtricitabine-tenofovir  and Tivicay     Current Outpatient Medications   Medication Sig Dispense Refill   ??? cephalexin (KEFLEX) 500 MG capsule Take 1 capsule (500 mg total) by mouth Two (2) times a day for 12 days. 24 capsule 0   ??? dolutegravir (TIVICAY) 50 mg Tab TABLET Take 1 tablet (50 mg total) by mouth Two (2) times a day. On 6/14, decrease to 50 mg (1 tablet) by mouth once daily 60 tablet 2   ??? emtricitabine-tenofovir, TDF, (TRUVADA) 200-300 mg per tablet Take 1 tablet by mouth daily. 30 tablet 2   ??? pravastatin (PRAVACHOL) 80 MG tablet Take 1 tablet (80 mg total) by mouth daily. 30 tablet 2     No current facility-administered medications for this visit.        Changes to medications: Rifampin and IV Cefazolin were stopped and Keflex was started    No Known Allergies    Changes to allergies: No    SPECIALTY MEDICATION ADHERENCE     emtricitabine-tenofovir (TDF) 200-300 mg: estimated 6 days of medicine on hand   Tivicay 50 mg: estimated 6 days of medicine on hand (probably has more since he decreased his dose to once daily on 07/10/19)    Medication Adherence    Patient reported X missed doses in the last month: 0  Specialty Medication: Tivicay 50mg   Patient is on additional specialty medications: Yes  Additional Specialty Medications: emtricitabine-tenofovir-df 200-300mg   Patient Reported Additional Medication X Missed Doses in the Last Month: 0  Demonstrates understanding of importance of adherence: yes  Informant: patient  Provider-estimated medication adherence level: good  Patient is at risk for Non-Adherence: No          Specialty medication(s) dose(s) confirmed: Tivicay dose is now 50mg  once daily due to discontinuation of the rifampin.     Are there any concerns with adherence? No    Adherence counseling provided? Not needed    CLINICAL MANAGEMENT AND INTERVENTION      Clinical Benefit Assessment:    Do you feel the medicine is effective or helping your condition? Yes    Clinical Benefit counseling provided? Not needed    Adverse Effects Assessment:    Are you experiencing any side effects? No    Are you experiencing difficulty administering your medicine? No    Quality of Life Assessment:    How many days over the past month did your HIV  keep you from your normal activities? For example, brushing your teeth or getting up in the morning. 0    Have you discussed this with your provider? Not needed    Therapy Appropriateness:    Is therapy appropriate? Yes, therapy is appropriate and should be continued    DISEASE/MEDICATION-SPECIFIC INFORMATION      N/A    PATIENT SPECIFIC NEEDS     - Does the patient have any physical, cognitive, or cultural barriers? No    - Is the patient high risk? No     - Does the patient require a Care Management Plan? No     - Does the patient require physician intervention or other additional services (i.e. nutrition, smoking cessation, social work)?  No      SHIPPING     Specialty Medication(s) to be Shipped:   Infectious Disease: Patient prefers to not have medicine shipped at this time since he is having open heart surgery on 07/21/19 and does not know how long he will be in the hospital.  He requests that I call him in 2 weeks. I have scheduled a call for 07/29/19.    Other medication(s) to be shipped: n/a     Changes to insurance: No    Delivery Scheduled: No     The patient will receive a drug information handout for each medication shipped and additional FDA Medication Guides as required.  Verified that patient has previously received a Conservation officer, historic buildings.    All of the patient's questions and concerns have been addressed.    [name]   [facility] Specialty Pharmacist

## 2019-07-20 DIAGNOSIS — Z21 Asymptomatic human immunodeficiency virus [HIV] infection status: Principal | ICD-10-CM

## 2019-07-20 MED ORDER — TIVICAY 50 MG TABLET
ORAL_TABLET | Freq: Every day | ORAL | 2 refills | 60.00000 days | Status: CP
Start: 2019-07-20 — End: ?
  Filled 2019-08-20: qty 30, 30d supply, fill #0

## 2019-07-21 HISTORY — PX: MECHANICAL AORTIC VALVE REPLACEMENT: SHX2013

## 2019-07-21 HISTORY — PX: BENTALL PROCEDURE: SHX5058

## 2019-07-21 HISTORY — PX: ASCENDING AORTIC ANEURYSM REPAIR: SHX1191

## 2019-07-23 ENCOUNTER — Ambulatory Visit: Admit: 2019-07-23 | Payer: MEDICAID

## 2019-07-23 NOTE — Unmapped (Signed)
Left VM at [phone] asking patient to call our office back to verify if patient still has monitor from previous state. Monitor was ordered back on 04/21/2018 and sent to address in [place]. No transmissions received yet, Scheduled patient for 7/2 on the site.

## 2019-07-28 MED ORDER — WARFARIN 2.5 MG TABLET
Freq: Every evening | ORAL | 0 days
Start: 2019-07-28 — End: 2020-07-27

## 2019-07-28 MED ORDER — OXYCODONE 5 MG TABLET
ORAL | 0.00000 days | PRN
Start: 2019-07-28 — End: 2019-08-04

## 2019-07-28 MED ORDER — POTASSIUM CHLORIDE ER 20 MEQ TABLET,EXTENDED RELEASE(PART/CRYST)
Freq: Every day | ORAL | 0.00000 days
Start: 2019-07-28 — End: ?

## 2019-07-28 MED ORDER — METOPROLOL TARTRATE 25 MG TABLET
Freq: Two times a day (BID) | ORAL | 0 days
Start: 2019-07-28 — End: 2020-07-27

## 2019-07-29 NOTE — Unmapped (Signed)
Pt asked, can I have my INR level drawn at home? I have a home health nurse.

## 2019-07-29 NOTE — Unmapped (Signed)
Per Dr Lorin Picket called patient to schedule coag appointment within -2-5 days Patient declined coming to clinic would like orders put in so his home health nurse can complete INR to be sent to lab       Wynona Canes has message Dr Lorin Picket to confirmed

## 2019-07-29 NOTE — Unmapped (Signed)
Pt called, had recent surgery and wants and order for home health to draw his INR.  I explained we are cardiology and gave hi the number of Internal med where he has been seen before.

## 2019-08-03 NOTE — Unmapped (Signed)
[facility] Clinical Assessment & Refill Coordination Note    Warren Orndoff., DOB: 23-Feb-1975  Phone: [phone] (home)     All above HIPAA information was verified with patient.     Was a Nurse, learning disability used for this call? No    Specialty Medication(s):   Infectious Disease: emtricitabine-tenofovir  and Tivicay     Current Outpatient Medications   Medication Sig Dispense Refill   ??? metoprolol tartrate (LOPRESSOR) 25 MG tablet Take 25 mg by mouth every twelve (12) hours.     ??? oxyCODONE (ROXICODONE) 5 MG immediate release tablet Take 5 mg by mouth every four (4) hours as needed.     ??? warfarin (JANTOVEN) 2.5 MG tablet Take 2.5 mg by mouth nightly.     ??? dolutegravir (TIVICAY) 50 mg TABLET Take 1 tablet (50 mg total) by mouth daily. 60 tablet 2   ??? emtricitabine-tenofovir, TDF, (TRUVADA) 200-300 mg per tablet Take 1 tablet by mouth daily. 30 tablet 2   ??? pravastatin (PRAVACHOL) 80 MG tablet Take 1 tablet (80 mg total) by mouth daily. 30 tablet 2     No current facility-administered medications for this visit.        Changes to medications: Javonnie reports starting the following medications: metoprolol, warfarin, furosemide, potassium chloride, and oxycodone    No Known Allergies    Changes to allergies: No    SPECIALTY MEDICATION ADHERENCE     Tivicay 50 mg: approximately 22 days of medicine on hand   emtricitabine-tenofovir disoproxil fumarate 200-300 mg: approximately 22 days of medicine on hand     He received a 30 day supply on 07/28/19 at discharge from [place].    Medication Adherence    Patient reported X missed doses in the last month: 0  Specialty Medication: Tivicay 50mg   Patient is on additional specialty medications: Yes  Additional Specialty Medications: emtricitabine-tenofovir (TDF) 200-300mg   Patient Reported Additional Medication X Missed Doses in the Last Month: 0  Patient is on more than two specialty medications: No  Demonstrates understanding of importance of adherence: yes  Informant: patient  Provider-estimated medication adherence level: good  Patient is at risk for Non-Adherence: No          Specialty medication(s) dose(s) confirmed: Regimen is correct and unchanged.     Are there any concerns with adherence? No    Adherence counseling provided? Not needed    CLINICAL MANAGEMENT AND INTERVENTION      Clinical Benefit Assessment:    Do you feel the medicine is effective or helping your condition? Yes    Clinical Benefit counseling provided? Not needed    Adverse Effects Assessment:    Are you experiencing any side effects? No    Are you experiencing difficulty administering your medicine? No    Quality of Life Assessment:    How many days over the past month did your HIV  keep you from your normal activities? For example, brushing your teeth or getting up in the morning. 0    Have you discussed this with your provider? Not needed    Therapy Appropriateness:    Is therapy appropriate? Yes, therapy is appropriate and should be continued    DISEASE/MEDICATION-SPECIFIC INFORMATION      N/A    PATIENT SPECIFIC NEEDS     - Does the patient have any physical, cognitive, or cultural barriers? No    - Is the patient high risk? No     - Does the patient require  a Care Management Plan? No     - Does the patient require physician intervention or other additional services (i.e. nutrition, smoking cessation, social work)? No      SHIPPING     Specialty Medication(s) to be Shipped:   Infectious Disease: emtricitabine-tenofovir  and Tivicay    Other medication(s) to be shipped: 08/20/19       Changes to insurance: No    Delivery Scheduled: Yes, Expected medication delivery date: 08/20/19.     Medication will be delivered via UPS to the confirmed prescription address in [facility].    The patient will receive a drug information handout for each medication shipped and additional FDA Medication Guides as required.  Verified that patient has previously received a Conservation officer, historic buildings.    All of the patient's questions and concerns have been addressed.    [name]   [facility] Specialty Pharmacist

## 2019-08-09 NOTE — Unmapped (Unsigned)
DIVISION OF CARDIOLOGY  [employer] [place]        Date of Service: 08/13/2019    PCP: Referring Provider:   Idelia Salm, MD  [address] [facility] [place]  [place] [address]  Phone: [phone]  Fax: [phone] Warren Potts, MD  [address] [facility]  [place],  [place] [address]  Phone: [phone]  Fax: [phone]     Assessment and Plan:     ***:    The patient was seen and discussed with Dr. Jon Billings.    No follow-ups on file.    Subjective:     Reason for consultation: history of native and prosthetic AoV endocarditis     HPI: Warren [name] is a 44 y.o. 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 The patient is seen at the request of Warren [name] for evaluation of endocarditis of both native and prosthetic aortic valve. He was admitted to the [facility] cardiology service from 5/3-5/11 with prosthetic AV endocarditis. SRS was consulted but deemed patient too high-risk for reoperation.     He was subsequently referred to [facility] where he underwent a redo [place] with mechanical AVR (On-X) on 07/21/19. Plan was for 2 weeks of IV cefazolin post-surgery (end-date 7/14).     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 He was discharged with IV antibiotics on 5/11 on nafcillin 2g q4 till 6/13 (6 weeks), Gentamicin 3mg /kg/24 for 2 weeks (to end 5/22), and Rifampin 300 mg q8 till 6/13 (6 weeks). He was evaluated for high risk redo surgery??@ [place] but referred to [facility] as he was considered too high risk. It was recommended to proceed with completion of 6 weeks of antibiotics and then redo-root replacement.????He was managed as an outpatient with [facility] and completed IV Cefazolin and Rifampin on 6/18. He was started on Keflex for suppressive therapy through surgery on 6/29.??His risk factor for these infections is not clear to me-denies IVDU.     Cardiovascular history:  Infective endocarditis of the aortic valve complicated by aortic insufficient and root abscess (status post homograft in 02/2018)    Cardiac medications:  Metoprolol tartrate 25 mg BID  Warfarin 2.5 mg QHS  Pravastatin 80 mg QD    Past medical history:  Patient Active Problem List   Diagnosis   ??? Acute bacterial endocarditis   ??? History of prosthetic aortic valve   ??? HIV (human immunodeficiency virus infection) (CMS-HCC)   ??? Bacteremia due to Staphylococcus aureus   ??? Tobacco use disorder   ??? AKI (acute kidney injury) (CMS-HCC)   ??? Moderate aortic insufficiency   ??? HLD (hyperlipidemia)       Medications:   Patient's Medications   New Prescriptions    No medications on file   Previous Medications    DOLUTEGRAVIR (TIVICAY) 50 MG TABLET  Take 1 tablet (50 mg total) by mouth daily.    EMTRICITABINE-TENOFOVIR, TDF, (TRUVADA) 200-300 MG PER TABLET    Take 1 tablet by mouth daily.    METOPROLOL TARTRATE (LOPRESSOR) 25 MG TABLET    Take 25 mg by mouth every twelve (12) hours.    PRAVASTATIN (PRAVACHOL) 80 MG TABLET    Take 1 tablet (80 mg total) by mouth daily.    WARFARIN (JANTOVEN) 2.5 MG TABLET    Take 2.5 mg by mouth nightly.   Modified Medications    No medications on file   Discontinued Medications    No medications on file       Allergies:  No Known Allergies    Social history:  He  reports that he has been smoking cigarettes. He has been smoking about 0.25 packs per day. He has never used smokeless tobacco. He reports current alcohol use of about 2.0 standard drinks of alcohol per week. He reports previous drug use. Drug: Marijuana.    Family history:  His family history includes Mental illness in his mother; No Known Problems in his brother, father, and sister.    ROS: 10 systems were reviewed and negative except as noted in HPI.     Objective:      There were no vitals taken for this visit.   Wt Readings from Last 3 Encounters:   07/10/19 98.4 kg (217 lb)   06/23/19 (!) 102.2 kg (225 lb 3.2 oz)   06/02/19 99.4 kg (219 lb 3.2 oz)       Physical Exam:  GEN: nontoxic-appearing, no acute distress  CV: normal rate, regular rhythm, no murmurs rubs or gallops, no carotid bruits, no JVD at 90?? with hepatojugular reflux  PULM: CTAB, no increased work of breathing  ABD: soft, nondistended, nontender  EXT: no peripheral edema, warm with intact distal pulses  NEURO: alert, oriented, no focal deficits    Most recent labs:  Lab Results   Component Value Date    Sodium 135 07/10/2019    Potassium 3.9 07/10/2019    Chloride 106 07/10/2019    CO2 24.9 07/10/2019    BUN 12 07/10/2019    Creatinine 1.33 (H) 07/10/2019    Magnesium 2.2 06/02/2019     Lab Results   Component Value Date    HGB 12.6 (L) 07/10/2019    MCV 96.1 (H) 07/10/2019    Platelet 277 07/10/2019     Lab Results   Component Value Date    Cholesterol 134 05/25/2019    Triglycerides 256 (H) 05/25/2019    HDL 29 (L) 05/25/2019    Non-HDL Cholesterol 105 05/25/2019    LDL Calculated 54 (L) 05/25/2019    Hemoglobin A1C <4.0 (L) 07/10/2019       ECG:   *** - (personally reviewed)    Echocardiogram:     TTE (05/26/19):  -S/P aortic homograft 2020. Paravalvular echo lucency measuring  approximately 2cm wide as previously seen on TEE. It extends 180 degrees along  the right border of the homograft. There appears to be dehiscence and rocking  of the aortic homograft.  -There is mild to moderate regurgitation of the prosthetic aortic valve.  -The left ventricle is normal in size with normal wall thickness.  -The left ventricular systolic function is mildly decreased, LVEF is  visually estimated at 45-50%.  -There is decreased contractile function involving the apical segment(s).  -There is mild mitral valve regurgitation.  -The right ventricle is normal in size, with normal  systolic function.  -There is no evidence of a significant pericardial effusion.    LHC: N/A    Stress test: N/A    Ambulatory monitor: N/A

## 2019-08-10 NOTE — Unmapped (Unsigned)
Duration of Intervention: <5 min     SW attempted to reach out to patient via phone. Patient did not answer. SW left a discreet message for a call back.     Briaunna Grindstaff LCSWA, LCAS  ID Clinic Social Work   Direct: [phone]

## 2019-08-11 NOTE — Unmapped (Addendum)
RN contacted patient to find out how he was doing after his surgery, and if he has had his INRs drawn since his surgery. Pt. Reports that his recovery is going well, but he has not had INRs drawn since leaving the hospital. Pt. Had requested home health to do it, but home health said that INRs have to be processed right away, and it was a 45 min drive back to the hospital. RN contacted the IM clinic and patient was scheduled to be seen on 08/18/2019. RN will forward message to SW [facility] for Medicaid transportation set up.

## 2019-08-12 NOTE — Unmapped (Unsigned)
Duration of Intervention: 20 min     Patient is connected with Medicaid Managed Care, [employer].  Social Worker called Investment banker, corporate at [phone] and set up transportation for internal med appointment on 08/21/19 at 10:15am.      SW called patient to inform about transportation and to confirm appointment tomorrow with cardiology.  Patient was not aware of appointment tomorrow with cardiology. SW provided number to both transportation hub and cardiology to reschedule appointment and coordinate transportation to that appointment. Patient was going to see if he could get appointment for same day as internal medicine. Patient agreed to call this social worker back if he had any issues setting up transportation.     Warren [name] LCSWA, LCAS  ID Clinic Social Work   Direct: [phone]

## 2019-08-18 NOTE — Unmapped (Unsigned)
Duration of Intervention: 5 min     Patient called this social worker to remind himself of appointment and times for upcoming appointment on Friday. SW provided information and confirmed that he had proper number for transportation.  Patient said that he was going to call transportation to set up for upcoming appointment on Friday.  SW encouraged patient to call this social worker if he had any issues.     Micaiah Litle LCSWA, LCAS  ID Clinic Social Work   Direct: [phone]

## 2019-08-19 NOTE — Unmapped (Signed)
Warren [name]. 's ENTIRE ORDER shipment will be delayed as a result of the medication is too soon to refill until 08/20/19.     I have reached out to the patient and left a voicemail message.  We will wait for a call back from the patient to reschedule the delivery.  We have not confirmed the new delivery date.

## 2019-08-20 MED FILL — EMTRICITABINE 200 MG-TENOFOVIR DISOPROXIL FUMARATE 300 MG TABLET: 30 days supply | Qty: 30 | Fill #1 | Status: AC

## 2019-08-20 MED FILL — EMTRICITABINE 200 MG-TENOFOVIR DISOPROXIL FUMARATE 300 MG TABLET: ORAL | 30 days supply | Qty: 30 | Fill #1

## 2019-08-20 MED FILL — TIVICAY 50 MG TABLET: 30 days supply | Qty: 30 | Fill #0 | Status: AC

## 2019-08-20 NOTE — Unmapped (Addendum)
Update:     Mr,. [place]'s emtricitabine-tenofovir (TDF) 200-300mg  and Tivicay 50mg  will now be delivered on 08/21/19 via UPS.  Delivery date has been confirmed with Mr. Llanas.    Corliss Skains. Ama, [place].D.  Specialty Pharmacist  [facility]  [phone] option [phone]

## 2019-08-21 ENCOUNTER — Ambulatory Visit: Admit: 2019-08-21 | Discharge: 2019-08-22 | Payer: MEDICAID

## 2019-08-21 DIAGNOSIS — Z952 Presence of prosthetic heart valve: Principal | ICD-10-CM

## 2019-08-21 DIAGNOSIS — I639 Cerebral infarction, unspecified: Principal | ICD-10-CM

## 2019-08-21 MED ORDER — OXYCODONE 5 MG TABLET
ORAL_TABLET | ORAL | 0 refills | 2 days | Status: CN | PRN
Start: 2019-08-21 — End: 2019-08-28

## 2019-08-21 MED ORDER — WARFARIN 2.5 MG TABLET
ORAL_TABLET | 11 refills | 0.00000 days
Start: 2019-08-21 — End: ?

## 2019-08-21 MED ORDER — ENOXAPARIN 100 MG/ML SUBCUTANEOUS SYRINGE: 90 mg | mL | Freq: Two times a day (BID) | 0 refills | 6 days | Status: AC

## 2019-08-21 MED ORDER — ENOXAPARIN 100 MG/ML SUBCUTANEOUS SYRINGE
Freq: Two times a day (BID) | SUBCUTANEOUS | 0 refills | 6.00000 days | Status: CP
Start: 2019-08-21 — End: 2019-08-21
  Filled 2019-08-21: qty 10, 5d supply, fill #0

## 2019-08-21 MED FILL — ENOXAPARIN 100 MG/ML SUBCUTANEOUS SYRINGE: 5 days supply | Qty: 10 | Fill #0 | Status: AC

## 2019-08-21 NOTE — Unmapped (Signed)
Patient: Warren [name].    Medical Record Number:  [id number]    INR Results:  1.2     Steady State: yes  Current Dose:  2.5 mg daily  Date INR: 08/21/2019  Goal INR: 2.5-3.5    Lab Results   Component Value Date    INR, POC 1.2 08/21/2019       ASSESSMENT/PLAN:Plan for Warfarin Dose:  increased dose to 5 mg MF 2.5 mg other days.  Start LMWH bridge    1. Status post mechanical aortic valve replacement    2. Cerebrovascular accident (CVA), unspecified mechanism (CMS-HCC)      Subtherapeutic INR 1.2, at [facility].  Increase dose as above and start bridge, weight based, given high thromboembolic risk.  Sent to pharmacy.  Confirmed they have and will teach LMWH.  Basic VKA education was provided today.    Repeat INR Date:  5-7 days    ----------------------------------------------------------------------------------------------------------------------    CC:  mech aortic valve replacement  Ho CVA, ho bioprosthetic aortic valve with failure    PRIMARY CARE PHYSICIAN: Idelia Salm, MD    PMHx:   Patient Active Problem List   Diagnosis   ??? Acute bacterial endocarditis   ??? History of prosthetic aortic valve   ??? HIV (human immunodeficiency virus infection) (CMS-HCC)   ??? Bacteremia due to Staphylococcus aureus   ??? Tobacco use disorder   ??? AKI (acute kidney injury) (CMS-HCC)   ??? Severe aortic insufficiency   ??? HLD (hyperlipidemia)   ??? CVA (cerebral vascular accident) (CMS-HCC)   ??? Status post mechanical aortic valve replacement       Primary Warfarin Indication:  mechanical aortic valve replacement  Secondary Indication: ho CVA    Signs/Symptoms Bleeding:   Nose Bleeds:no Gum Bleeds:no Change in Urine       Color:no Change in Stool Color:no    Bright Red Blood    per Rectum:no  Abnormal Bruising: no      Signs/Symptoms of Potential Embolic Events: no    Changes In:      Diet:no  Medications:no  Activities / Lifestyle: no  Health Status:no  Current EtOH Use:no   Current Smoker: no    Additional History:  Surgery was 6/29 at [facility].  Discharged 7/6 and started warfarin that day 2.5 mg daily.  No missed doses.     Medications reviewed including OTC and herbals:yes    The patient set a personal goal for anticoagulation management today:Take warfarin daily as recommended.    Medication adherence and barriers to the treatment plan have been addressed. Opportunities to optimize healthy behaviors have been discussed. Patient / caregiver voiced understanding.      Laural Benes, PA-C    Supervising physician:  Gabrielle Dare, MD

## 2019-08-21 NOTE — Unmapped (Signed)
INCREASE warfarin to 2 pills Monday and Friday, 1 pill all other days  START Lovenox injections 0.9 mL (you have to squire 0.1 mL out) twice daily.  Start this tonight  Return next week for recheck

## 2019-08-21 NOTE — Unmapped (Signed)
[facility] at [facility]       Type of visit:  face to face    Reason for visit:1.2 inr  14.4 sec    General Consent to Treat (GCT) for non-epic video visits only: Verbal consent            Screening BP- 98/75          Allergies reviewed: Yes    Medication reviewed: Yes  Pended refills? Yes        HCDM reviewed and updated in Epic:    We are working to make sure all of our patients??? wishes are updated in Epic and part of that is documenting a Environmental health practitioner for each patient  A Health Care Decision Rodena Piety is someone you choose who can make health care decisions for you if you are not able ??? who would you most want to do this for you????  was updated.              COVID-19 Vaccine Summary  Type: PFIZER 1 ST SHOT SCHEDULED FOR SECOND  Dates Given:                         There is no immunization history on file for this patient.    __________________________________________________________________________________________    SCREENINGS COMPLETED IN FLOWSHEETS    HARK Screening       AUDIT       PHQ2       PHQ9          P4 Suicidality Screener                GAD7       COPD Assessment       Falls Risk

## 2019-08-27 NOTE — Unmapped (Unsigned)
Duration of Intervention: < 5 min     SW reached out to patient to check in and follow up to make sure he was able to schedule transportation to upcoming appointments. Patient did not answer, social worker left a discreet message for a call back.     Arushi Partridge LCSW, LCAS  [facility] Social Work   Direct: [phone]

## 2019-08-30 ENCOUNTER — Inpatient Hospital Stay (HOSPITAL_COMMUNITY): Payer: Medicaid Other

## 2019-08-30 ENCOUNTER — Other Ambulatory Visit: Payer: Self-pay

## 2019-08-30 ENCOUNTER — Inpatient Hospital Stay (HOSPITAL_COMMUNITY)
Admission: EM | Admit: 2019-08-30 | Discharge: 2019-09-02 | DRG: 522 | Disposition: A | Discharge status: Home / Self Care | Payer: Medicaid Other | Attending: Internal Medicine | Admitting: Internal Medicine

## 2019-08-30 ENCOUNTER — Encounter (HOSPITAL_COMMUNITY): Payer: Self-pay | Admitting: *Deleted

## 2019-08-30 ENCOUNTER — Inpatient Hospital Stay (HOSPITAL_COMMUNITY): Payer: Medicaid Other | Admitting: Certified Registered Nurse Anesthetist

## 2019-08-30 ENCOUNTER — Emergency Department (HOSPITAL_COMMUNITY): Payer: Medicaid Other

## 2019-08-30 ENCOUNTER — Encounter (HOSPITAL_COMMUNITY)
Admission: EM | Disposition: A | Discharge status: Home / Self Care | Payer: Self-pay | Source: Home / Self Care | Attending: Family Medicine

## 2019-08-30 DIAGNOSIS — S72031A Displaced midcervical fracture of right femur, initial encounter for closed fracture: Secondary | ICD-10-CM | POA: Diagnosis present

## 2019-08-30 DIAGNOSIS — Z79899 Other long term (current) drug therapy: Secondary | ICD-10-CM

## 2019-08-30 DIAGNOSIS — Z9119 Patient's noncompliance with other medical treatment and regimen: Secondary | ICD-10-CM | POA: Diagnosis not present

## 2019-08-30 DIAGNOSIS — Z952 Presence of prosthetic heart valve: Secondary | ICD-10-CM

## 2019-08-30 DIAGNOSIS — E785 Hyperlipidemia, unspecified: Secondary | ICD-10-CM | POA: Diagnosis present

## 2019-08-30 DIAGNOSIS — Z96642 Presence of left artificial hip joint: Secondary | ICD-10-CM

## 2019-08-30 DIAGNOSIS — Z01818 Encounter for other preprocedural examination: Secondary | ICD-10-CM

## 2019-08-30 DIAGNOSIS — Z21 Asymptomatic human immunodeficiency virus [HIV] infection status: Secondary | ICD-10-CM | POA: Diagnosis present

## 2019-08-30 DIAGNOSIS — D638 Anemia in other chronic diseases classified elsewhere: Secondary | ICD-10-CM | POA: Diagnosis present

## 2019-08-30 DIAGNOSIS — I358 Other nonrheumatic aortic valve disorders: Secondary | ICD-10-CM

## 2019-08-30 DIAGNOSIS — Y92009 Unspecified place in unspecified non-institutional (private) residence as the place of occurrence of the external cause: Secondary | ICD-10-CM | POA: Diagnosis not present

## 2019-08-30 DIAGNOSIS — D62 Acute posthemorrhagic anemia: Secondary | ICD-10-CM | POA: Diagnosis not present

## 2019-08-30 DIAGNOSIS — S72001A Fracture of unspecified part of neck of right femur, initial encounter for closed fracture: Principal | ICD-10-CM | POA: Diagnosis present

## 2019-08-30 DIAGNOSIS — Z96649 Presence of unspecified artificial hip joint: Secondary | ICD-10-CM

## 2019-08-30 DIAGNOSIS — Z20822 Contact with and (suspected) exposure to covid-19: Secondary | ICD-10-CM | POA: Diagnosis present

## 2019-08-30 DIAGNOSIS — W1830XA Fall on same level, unspecified, initial encounter: Secondary | ICD-10-CM | POA: Diagnosis present

## 2019-08-30 DIAGNOSIS — R791 Abnormal coagulation profile: Secondary | ICD-10-CM | POA: Diagnosis present

## 2019-08-30 DIAGNOSIS — I252 Old myocardial infarction: Secondary | ICD-10-CM

## 2019-08-30 DIAGNOSIS — Z01810 Encounter for preprocedural cardiovascular examination: Secondary | ICD-10-CM

## 2019-08-30 DIAGNOSIS — F17200 Nicotine dependence, unspecified, uncomplicated: Secondary | ICD-10-CM | POA: Diagnosis present

## 2019-08-30 DIAGNOSIS — B20 Human immunodeficiency virus [HIV] disease: Secondary | ICD-10-CM | POA: Diagnosis present

## 2019-08-30 DIAGNOSIS — Z8673 Personal history of transient ischemic attack (TIA), and cerebral infarction without residual deficits: Secondary | ICD-10-CM | POA: Diagnosis not present

## 2019-08-30 DIAGNOSIS — T1490XA Injury, unspecified, initial encounter: Secondary | ICD-10-CM

## 2019-08-30 HISTORY — DX: Presence of prosthetic heart valve: Z95.2

## 2019-08-30 HISTORY — DX: Other nonrheumatic aortic valve disorders: I35.8

## 2019-08-30 HISTORY — PX: TOTAL HIP ARTHROPLASTY: SHX124

## 2019-08-30 HISTORY — DX: Asymptomatic human immunodeficiency virus (hiv) infection status: Z21

## 2019-08-30 HISTORY — DX: Human immunodeficiency virus (HIV) disease: B20

## 2019-08-30 LAB — ECHOCARDIOGRAM COMPLETE
AR max vel: 2.85 cm2
AV Area VTI: 2.63 cm2
AV Area mean vel: 2.54 cm2
AV Mean grad: 8.1 mmHg
AV Peak grad: 17.2 mmHg
Ao pk vel: 2.07 m/s
Area-P 1/2: 6.12 cm2
Height: 71 in
S' Lateral: 4.1 cm
Weight: 2959.46 oz

## 2019-08-30 LAB — CBC
HCT: 38.1 % — ABNORMAL LOW (ref 39.0–52.0)
HCT: 38 % — ABNORMAL LOW (ref 39.0–52.0)
Hemoglobin: 11.3 g/dL — ABNORMAL LOW (ref 13.0–17.0)
Hemoglobin: 11.2 g/dL — ABNORMAL LOW (ref 13.0–17.0)
MCH: 27.4 pg (ref 26.0–34.0)
MCH: 27.2 pg (ref 26.0–34.0)
MCHC: 29.7 g/dL — ABNORMAL LOW (ref 30.0–36.0)
MCHC: 29.5 g/dL — ABNORMAL LOW (ref 30.0–36.0)
MCV: 92.5 fL (ref 80.0–100.0)
MCV: 92.2 fL (ref 80.0–100.0)
Platelets: 281 10*3/uL (ref 150–400)
Platelets: 252 10*3/uL (ref 150–400)
RBC: 4.12 MIL/uL — ABNORMAL LOW (ref 4.22–5.81)
RBC: 4.12 MIL/uL — ABNORMAL LOW (ref 4.22–5.81)
RDW: 14 % (ref 11.5–15.5)
RDW: 14.2 % (ref 11.5–15.5)
WBC: 14.6 10*3/uL — ABNORMAL HIGH (ref 4.0–10.5)
WBC: 14.5 10*3/uL — ABNORMAL HIGH (ref 4.0–10.5)
nRBC: 0 % (ref 0.0–0.2)
nRBC: 0 % (ref 0.0–0.2)

## 2019-08-30 LAB — ABO/RH: ABO/RH(D): O POS

## 2019-08-30 LAB — BASIC METABOLIC PANEL
Anion gap: 14 (ref 5–15)
BUN: 10 mg/dL (ref 6–20)
CO2: 18 mmol/L — ABNORMAL LOW (ref 22–32)
Calcium: 8.7 mg/dL — ABNORMAL LOW (ref 8.9–10.3)
Chloride: 108 mmol/L (ref 98–111)
Creatinine, Ser: 1.16 mg/dL (ref 0.61–1.24)
GFR calc Af Amer: >60 mL/min (ref >60)
GFR calc non Af Amer: >60 mL/min (ref >60)
Glucose, Bld: 110 mg/dL — ABNORMAL HIGH (ref 70–99)
Potassium: 3.3 mmol/L — ABNORMAL LOW (ref 3.5–5.1)
Sodium: 140 mmol/L (ref 135–145)

## 2019-08-30 LAB — TYPE AND SCREEN
ABO/RH(D): O POS
Antibody Screen: NEGATIVE

## 2019-08-30 LAB — PROTIME-INR
INR: 1.1 (ref 0.8–1.2)
Prothrombin Time: 13.3 seconds (ref 11.4–15.2)

## 2019-08-30 LAB — SARS CORONAVIRUS 2 BY RT PCR (HOSPITAL ORDER, PERFORMED IN ~~LOC~~ HOSPITAL LAB): SARS Coronavirus 2: NEGATIVE

## 2019-08-30 LAB — MRSA PCR SCREENING: MRSA by PCR: NEGATIVE

## 2019-08-30 SURGERY — ARTHROPLASTY, HIP, TOTAL, ANTERIOR APPROACH
Anesthesia: General | Site: Hip | Laterality: Right

## 2019-08-30 MED ORDER — FENTANYL CITRATE (PF) 250 MCG/5ML IJ SOLN
INTRAMUSCULAR | Status: DC | PRN
  Administered 2019-08-30 (×2): 50 ug via INTRAVENOUS
  Administered 2019-08-30: 25 ug via INTRAVENOUS
  Administered 2019-08-30: 100 ug via INTRAVENOUS
  Administered 2019-08-30: 25 ug via INTRAVENOUS
  Administered 2019-08-30 (×3): 50 ug via INTRAVENOUS
  Administered 2019-08-30 (×2): 25 ug via INTRAVENOUS

## 2019-08-30 MED ORDER — ONDANSETRON HCL 4 MG PO TABS: 4.0000 mg | ORAL_TABLET | Freq: Four times a day (QID) | ORAL | Status: DC | PRN

## 2019-08-30 MED ORDER — VANCOMYCIN HCL 1 G IV SOLR
INTRAVENOUS | Status: DC | PRN
  Administered 2019-08-30: 1000 mg

## 2019-08-30 MED ORDER — BUPIVACAINE HCL (PF) 0.25 % IJ SOLN
INTRAMUSCULAR | Status: AC
  Filled 2019-08-30: qty 30

## 2019-08-30 MED ORDER — MAGNESIUM CITRATE PO SOLN: 1.0000 | Freq: Once | ORAL | Status: DC | PRN

## 2019-08-30 MED ORDER — DOCUSATE SODIUM 100 MG PO CAPS
100.0000 mg | ORAL_CAPSULE | Freq: Two times a day (BID) | ORAL | Status: DC
  Administered 2019-08-30 – 2019-09-01 (×5): 100 mg via ORAL
  Filled 2019-08-30 (×6): qty 1

## 2019-08-30 MED ORDER — CHLORHEXIDINE GLUCONATE 0.12 % MT SOLN
OROMUCOSAL | Status: AC
  Filled 2019-08-30: qty 15

## 2019-08-30 MED ORDER — HYDROCODONE-ACETAMINOPHEN 5-325 MG PO TABS
1.0000 | ORAL_TABLET | ORAL | Status: DC | PRN
  Administered 2019-08-30 – 2019-09-02 (×5): 2 via ORAL
  Filled 2019-08-30 (×5): qty 2

## 2019-08-30 MED ORDER — MIDAZOLAM HCL 2 MG/2ML IJ SOLN
INTRAMUSCULAR | Status: AC
  Filled 2019-08-30: qty 2

## 2019-08-30 MED ORDER — IRRISEPT - 450ML BOTTLE WITH 0.05% CHG IN STERILE WATER, USP 99.95% OPTIME
TOPICAL | Status: DC | PRN
  Administered 2019-08-30: 450 mL

## 2019-08-30 MED ORDER — LACTATED RINGERS IV SOLN: INTRAVENOUS | Status: DC | PRN

## 2019-08-30 MED ORDER — SORBITOL 70 % SOLN: 30.0000 mL | Freq: Every day | Status: DC | PRN

## 2019-08-30 MED ORDER — PROPOFOL 10 MG/ML IV BOLUS
INTRAVENOUS | Status: AC
  Filled 2019-08-30: qty 20

## 2019-08-30 MED ORDER — 0.9 % SODIUM CHLORIDE (POUR BTL) OPTIME
TOPICAL | Status: DC | PRN
  Administered 2019-08-30: 1000 mL

## 2019-08-30 MED ORDER — ACETAMINOPHEN 325 MG PO TABS: 325.0000 mg | ORAL_TABLET | Freq: Four times a day (QID) | ORAL | Status: DC | PRN

## 2019-08-30 MED ORDER — METHOCARBAMOL 500 MG PO TABS
ORAL_TABLET | ORAL | Status: AC
  Administered 2019-08-30: 500 mg via ORAL
  Filled 2019-08-30: qty 1

## 2019-08-30 MED ORDER — FENTANYL CITRATE (PF) 250 MCG/5ML IJ SOLN
INTRAMUSCULAR | Status: AC
  Filled 2019-08-30: qty 5

## 2019-08-30 MED ORDER — HYDROCODONE-ACETAMINOPHEN 7.5-325 MG PO TABS
1.0000 | ORAL_TABLET | ORAL | Status: DC | PRN
  Administered 2019-08-31 – 2019-09-01 (×7): 2 via ORAL
  Filled 2019-08-30 (×7): qty 2

## 2019-08-30 MED ORDER — POTASSIUM CHLORIDE CRYS ER 20 MEQ PO TBCR
40.0000 meq | EXTENDED_RELEASE_TABLET | Freq: Once | ORAL | Status: AC
  Administered 2019-08-30: 40 meq via ORAL
  Filled 2019-08-30: qty 2

## 2019-08-30 MED ORDER — ROCURONIUM BROMIDE 10 MG/ML (PF) SYRINGE
PREFILLED_SYRINGE | INTRAVENOUS | Status: DC | PRN
  Administered 2019-08-30: 80 mg via INTRAVENOUS

## 2019-08-30 MED ORDER — ACETAMINOPHEN 500 MG PO TABS
500.0000 mg | ORAL_TABLET | Freq: Four times a day (QID) | ORAL | Status: AC
  Administered 2019-08-31: 500 mg via ORAL
  Filled 2019-08-30 (×2): qty 1

## 2019-08-30 MED ORDER — ONDANSETRON HCL 4 MG/2ML IJ SOLN
4.0000 mg | Freq: Once | INTRAMUSCULAR | Status: AC | PRN
  Administered 2019-08-30: 4 mg via INTRAVENOUS
  Filled 2019-08-30: qty 2

## 2019-08-30 MED ORDER — SODIUM CHLORIDE 0.9 % IV SOLN: INTRAVENOUS | Status: DC

## 2019-08-30 MED ORDER — LIDOCAINE 2% (20 MG/ML) 5 ML SYRINGE
INTRAMUSCULAR | Status: DC | PRN
  Administered 2019-08-30: 60 mg via INTRAVENOUS

## 2019-08-30 MED ORDER — CEFAZOLIN SODIUM-DEXTROSE 2-3 GM-%(50ML) IV SOLR
INTRAVENOUS | Status: DC | PRN
Start: 2019-08-30 — End: 2019-08-30
  Administered 2019-08-30: 2 g via INTRAVENOUS

## 2019-08-30 MED ORDER — CEFAZOLIN SODIUM-DEXTROSE 2-4 GM/100ML-% IV SOLN
INTRAVENOUS | Status: AC
  Filled 2019-08-30: qty 100

## 2019-08-30 MED ORDER — SODIUM CHLORIDE 0.9% FLUSH
INTRAVENOUS | Status: DC | PRN
  Administered 2019-08-30: 20 mL

## 2019-08-30 MED ORDER — MORPHINE SULFATE (PF) 2 MG/ML IV SOLN: 0.5000 mg | INTRAVENOUS | Status: DC | PRN

## 2019-08-30 MED ORDER — MENTHOL 3 MG MT LOZG: 1.0000 | LOZENGE | OROMUCOSAL | Status: DC | PRN

## 2019-08-30 MED ORDER — PHENYLEPHRINE HCL (PRESSORS) 10 MG/ML IV SOLN
INTRAVENOUS | Status: DC | PRN
  Administered 2019-08-30 (×2): 40 ug via INTRAVENOUS

## 2019-08-30 MED ORDER — ALUM & MAG HYDROXIDE-SIMETH 200-200-20 MG/5ML PO SUSP: 30.0000 mL | ORAL | Status: DC | PRN

## 2019-08-30 MED ORDER — VANCOMYCIN HCL 1000 MG IV SOLR
INTRAVENOUS | Status: AC
  Filled 2019-08-30: qty 1000

## 2019-08-30 MED ORDER — PROPOFOL 10 MG/ML IV BOLUS
INTRAVENOUS | Status: DC | PRN
  Administered 2019-08-30: 180 mg via INTRAVENOUS

## 2019-08-30 MED ORDER — HYDROCODONE-ACETAMINOPHEN 5-325 MG PO TABS
1.0000 | ORAL_TABLET | Freq: Four times a day (QID) | ORAL | Status: DC | PRN
  Administered 2019-08-30: 2 via ORAL
  Filled 2019-08-30: qty 2

## 2019-08-30 MED ORDER — EMTRICITABINE-TENOFOVIR AF 200-25 MG PO TABS
1.0000 | ORAL_TABLET | Freq: Every day | ORAL | Status: DC
  Administered 2019-08-30 – 2019-09-02 (×4): 1 via ORAL
  Filled 2019-08-30 (×5): qty 1

## 2019-08-30 MED ORDER — DEXMEDETOMIDINE HCL 200 MCG/2ML IV SOLN
INTRAVENOUS | Status: DC | PRN
  Administered 2019-08-30 (×5): 4 ug via INTRAVENOUS

## 2019-08-30 MED ORDER — PHENOL 1.4 % MT LIQD: 1.0000 | OROMUCOSAL | Status: DC | PRN

## 2019-08-30 MED ORDER — TRANEXAMIC ACID 1000 MG/10ML IV SOLN
INTRAVENOUS | Status: DC | PRN
  Administered 2019-08-30: 1000 mg via INTRAVENOUS

## 2019-08-30 MED ORDER — CEFAZOLIN SODIUM-DEXTROSE 2-4 GM/100ML-% IV SOLN
2.0000 g | Freq: Four times a day (QID) | INTRAVENOUS | Status: AC
  Administered 2019-08-30 – 2019-08-31 (×3): 2 g via INTRAVENOUS
  Filled 2019-08-30 (×3): qty 100

## 2019-08-30 MED ORDER — BUPIVACAINE LIPOSOME 1.3 % IJ SUSP
20.0000 mL | Freq: Once | INTRAMUSCULAR | Status: DC
  Filled 2019-08-30: qty 20

## 2019-08-30 MED ORDER — CHLORHEXIDINE GLUCONATE 0.12 % MT SOLN
15.0000 mL | Freq: Once | OROMUCOSAL | Status: AC
  Administered 2019-08-30: 15 mL via OROMUCOSAL
  Filled 2019-08-30: qty 15

## 2019-08-30 MED ORDER — TRANEXAMIC ACID 1000 MG/10ML IV SOLN
INTRAVENOUS | Status: DC | PRN
  Administered 2019-08-30: 2000 mg via TOPICAL

## 2019-08-30 MED ORDER — MORPHINE SULFATE (PF) 2 MG/ML IV SOLN
1.0000 mg | INTRAVENOUS | Status: DC | PRN
  Administered 2019-08-31 – 2019-09-02 (×4): 1 mg via INTRAVENOUS
  Filled 2019-08-30 (×4): qty 1

## 2019-08-30 MED ORDER — HEPARIN BOLUS VIA INFUSION
2000.0000 [IU] | Freq: Once | INTRAVENOUS | Status: AC
  Administered 2019-08-30: 2000 [IU] via INTRAVENOUS
  Filled 2019-08-30: qty 2000

## 2019-08-30 MED ORDER — SODIUM CHLORIDE 0.9 % IR SOLN
Status: DC | PRN
  Administered 2019-08-30: 3000 mL

## 2019-08-30 MED ORDER — MIDAZOLAM HCL 2 MG/2ML IJ SOLN
INTRAMUSCULAR | Status: DC | PRN
  Administered 2019-08-30: 2 mg via INTRAVENOUS

## 2019-08-30 MED ORDER — FENTANYL CITRATE (PF) 100 MCG/2ML IJ SOLN
25.0000 ug | INTRAMUSCULAR | Status: DC | PRN
  Administered 2019-08-30: 50 ug via INTRAVENOUS

## 2019-08-30 MED ORDER — DEXAMETHASONE SODIUM PHOSPHATE 10 MG/ML IJ SOLN
INTRAMUSCULAR | Status: DC | PRN
  Administered 2019-08-30: 10 mg via INTRAVENOUS

## 2019-08-30 MED ORDER — ONDANSETRON HCL 4 MG/2ML IJ SOLN
INTRAMUSCULAR | Status: DC | PRN
  Administered 2019-08-30: 4 mg via INTRAVENOUS

## 2019-08-30 MED ORDER — METHOCARBAMOL 500 MG PO TABS
500.0000 mg | ORAL_TABLET | Freq: Four times a day (QID) | ORAL | Status: DC | PRN
  Administered 2019-08-31 – 2019-09-01 (×3): 500 mg via ORAL
  Filled 2019-08-30 (×3): qty 1

## 2019-08-30 MED ORDER — TRANEXAMIC ACID 1000 MG/10ML IV SOLN
2000.0000 mg | Freq: Once | INTRAVENOUS | Status: DC
  Filled 2019-08-30: qty 20

## 2019-08-30 MED ORDER — HEPARIN (PORCINE) 25000 UT/250ML-% IV SOLN
1200.0000 [IU]/h | INTRAVENOUS | Status: AC
  Administered 2019-08-30: 1200 [IU]/h via INTRAVENOUS
  Filled 2019-08-30: qty 250

## 2019-08-30 MED ORDER — FENTANYL CITRATE (PF) 100 MCG/2ML IJ SOLN
INTRAMUSCULAR | Status: AC
  Administered 2019-08-30: 50 ug via INTRAVENOUS
  Filled 2019-08-30: qty 2

## 2019-08-30 MED ORDER — ONDANSETRON HCL 4 MG/2ML IJ SOLN: 4.0000 mg | Freq: Four times a day (QID) | INTRAMUSCULAR | Status: DC | PRN

## 2019-08-30 MED ORDER — BUPIVACAINE HCL (PF) 0.25 % IJ SOLN
INTRAMUSCULAR | Status: DC | PRN
  Administered 2019-08-30: 20 mL

## 2019-08-30 MED ORDER — BUPIVACAINE LIPOSOME 1.3 % IJ SUSP
INTRAMUSCULAR | Status: DC | PRN
  Administered 2019-08-30: 20 mL

## 2019-08-30 MED ORDER — WARFARIN - PHARMACIST DOSING INPATIENT: Freq: Every day | Status: DC

## 2019-08-30 MED ORDER — HEPARIN (PORCINE) 25000 UT/250ML-% IV SOLN
1300.0000 [IU]/h | INTRAVENOUS | Status: DC
  Administered 2019-08-31: 1000 [IU]/h via INTRAVENOUS
  Filled 2019-08-30: qty 250

## 2019-08-30 MED ORDER — WARFARIN SODIUM 5 MG PO TABS
5.0000 mg | ORAL_TABLET | Freq: Once | ORAL | Status: AC
  Administered 2019-08-31: 5 mg via ORAL
  Filled 2019-08-30: qty 1

## 2019-08-30 MED ORDER — TRANEXAMIC ACID-NACL 1000-0.7 MG/100ML-% IV SOLN
INTRAVENOUS | Status: AC
  Filled 2019-08-30: qty 100

## 2019-08-30 MED ORDER — ESMOLOL HCL 100 MG/10ML IV SOLN
INTRAVENOUS | Status: DC | PRN
  Administered 2019-08-30: 20 mg via INTRAVENOUS

## 2019-08-30 MED ORDER — POLYETHYLENE GLYCOL 3350 17 G PO PACK: 17.0000 g | PACK | Freq: Every day | ORAL | Status: DC | PRN

## 2019-08-30 MED ORDER — OXYCODONE-ACETAMINOPHEN 5-325 MG PO TABS: 1.0000 | ORAL_TABLET | Freq: Three times a day (TID) | ORAL | 0 refills | Status: DC | PRN

## 2019-08-30 MED ORDER — MORPHINE SULFATE (PF) 4 MG/ML IV SOLN
4.0000 mg | Freq: Once | INTRAVENOUS | Status: AC
  Administered 2019-08-30: 4 mg via INTRAVENOUS
  Filled 2019-08-30: qty 1

## 2019-08-30 MED ORDER — ACETAMINOPHEN 10 MG/ML IV SOLN
INTRAVENOUS | Status: DC | PRN
Start: 2019-08-30 — End: 2019-08-30
  Administered 2019-08-30: 1000 mg via INTRAVENOUS

## 2019-08-30 MED ORDER — METHOCARBAMOL 1000 MG/10ML IJ SOLN
500.0000 mg | Freq: Four times a day (QID) | INTRAVENOUS | Status: DC | PRN
  Filled 2019-08-30: qty 5

## 2019-08-30 SURGICAL SUPPLY — 61 items
BAG DECANTER FOR FLEXI CONT (MISCELLANEOUS) ×3 IMPLANT
CELLS DAT CNTRL 66122 CELL SVR (MISCELLANEOUS) IMPLANT
COVER PERINEAL POST (MISCELLANEOUS) ×3 IMPLANT
COVER SURGICAL LIGHT HANDLE (MISCELLANEOUS) ×3 IMPLANT
COVER WAND RF STERILE (DRAPES) ×3 IMPLANT
CUP ACET PNNCL SECTR W/GRIP 56 (Hips) ×1 IMPLANT
DRAPE C-ARM 42X72 X-RAY (DRAPES) ×3 IMPLANT
DRAPE POUCH INSTRU U-SHP 10X18 (DRAPES) ×3 IMPLANT
DRAPE STERI IOBAN 125X83 (DRAPES) ×3 IMPLANT
DRAPE U-SHAPE 47X51 STRL (DRAPES) ×6 IMPLANT
DRSG AQUACEL AG ADV 3.5X10 (GAUZE/BANDAGES/DRESSINGS) ×3 IMPLANT
DURAPREP 26ML APPLICATOR (WOUND CARE) ×6 IMPLANT
ELECT BLADE 4.0 EZ CLEAN MEGAD (MISCELLANEOUS) ×3
ELECT REM PT RETURN 9FT ADLT (ELECTROSURGICAL) ×3
ELECTRODE BLDE 4.0 EZ CLN MEGD (MISCELLANEOUS) ×1 IMPLANT
ELECTRODE REM PT RTRN 9FT ADLT (ELECTROSURGICAL) ×1 IMPLANT
GLOVE BIOGEL PI IND STRL 7.0 (GLOVE) ×1 IMPLANT
GLOVE BIOGEL PI INDICATOR 7.0 (GLOVE) ×2
GLOVE ECLIPSE 7.0 STRL STRAW (GLOVE) ×6 IMPLANT
GLOVE SKINSENSE NS SZ7.5 (GLOVE) ×2
GLOVE SKINSENSE STRL SZ7.5 (GLOVE) ×1 IMPLANT
GLOVE SURG SYN 7.5  E (GLOVE) ×8
GLOVE SURG SYN 7.5 E (GLOVE) ×4 IMPLANT
GOWN STRL REIN XL XLG (GOWN DISPOSABLE) ×3 IMPLANT
GOWN STRL REUS W/ TWL LRG LVL3 (GOWN DISPOSABLE) ×1 IMPLANT
GOWN STRL REUS W/ TWL XL LVL3 (GOWN DISPOSABLE) ×1 IMPLANT
GOWN STRL REUS W/TWL LRG LVL3 (GOWN DISPOSABLE) ×2
GOWN STRL REUS W/TWL XL LVL3 (GOWN DISPOSABLE) ×2
HANDPIECE INTERPULSE COAX TIP (DISPOSABLE) ×2
HEAD CERAMIC DELTA 36 PLUS 1.5 (Hips) ×3 IMPLANT
HOOD PEEL AWAY FLYTE STAYCOOL (MISCELLANEOUS) ×6 IMPLANT
IV NS IRRIG 3000ML ARTHROMATIC (IV SOLUTION) ×3 IMPLANT
JET LAVAGE IRRISEPT WOUND (IRRIGATION / IRRIGATOR) ×3
KIT BASIN OR (CUSTOM PROCEDURE TRAY) ×3 IMPLANT
LAVAGE JET IRRISEPT WOUND (IRRIGATION / IRRIGATOR) ×1 IMPLANT
MARKER SKIN DUAL TIP RULER LAB (MISCELLANEOUS) ×3 IMPLANT
NEEDLE SPNL 18GX3.5 QUINCKE PK (NEEDLE) ×3 IMPLANT
PACK TOTAL JOINT (CUSTOM PROCEDURE TRAY) ×3 IMPLANT
PACK UNIVERSAL I (CUSTOM PROCEDURE TRAY) ×3 IMPLANT
PINN SECTOR W/GRIP ACE CUP 56 (Hips) ×3 IMPLANT
PINNACLE ALTRX PLUS 4 N 36X56 (Hips) ×3 IMPLANT
RTRCTR WOUND ALEXIS 18CM MED (MISCELLANEOUS)
SAW OSC TIP CART 19.5X105X1.3 (SAW) ×3 IMPLANT
SCREW 6.5MMX30MM (Screw) ×3 IMPLANT
SET HNDPC FAN SPRY TIP SCT (DISPOSABLE) ×1 IMPLANT
STAPLER SKIN PROX 35W (STAPLE) ×3 IMPLANT
STAPLER VISISTAT 35W (STAPLE) IMPLANT
STEM FEMORAL SZ5 HIGH ACTIS (Stem) ×3 IMPLANT
SUT ETHIBOND 2 V 37 (SUTURE) ×3 IMPLANT
SUT VIC AB 0 CT1 27 (SUTURE) ×2
SUT VIC AB 0 CT1 27XBRD ANBCTR (SUTURE) ×1 IMPLANT
SUT VIC AB 1 CTX 36 (SUTURE) ×2
SUT VIC AB 1 CTX36XBRD ANBCTR (SUTURE) ×1 IMPLANT
SUT VIC AB 2-0 CT1 27 (SUTURE) ×4
SUT VIC AB 2-0 CT1 TAPERPNT 27 (SUTURE) ×2 IMPLANT
SYR 50ML LL SCALE MARK (SYRINGE) ×3 IMPLANT
TOWEL GREEN STERILE (TOWEL DISPOSABLE) ×3 IMPLANT
TRAY CATH 16FR W/PLASTIC CATH (SET/KITS/TRAYS/PACK) IMPLANT
TRAY FOLEY W/BAG SLVR 16FR (SET/KITS/TRAYS/PACK) ×2
TRAY FOLEY W/BAG SLVR 16FR ST (SET/KITS/TRAYS/PACK) ×1 IMPLANT
YANKAUER SUCT BULB TIP NO VENT (SUCTIONS) ×3 IMPLANT

## 2019-08-30 NOTE — Progress Notes (Signed)
1800 Received pt from PACU, A&O x4. Right hip dressing dry and intact. Ice pack in place. Denies pain at this time.
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
## 2019-08-30 NOTE — Progress Notes (Signed)
44 year old gentleman with a history of HIV on [place], S.Pneumo aortic valve endocarditis in Feb 2020, had AVR and PPM placement for CHB at that time. Had MSSA endocarditis earlier this year, completed ABx, had repeat AVR with mechanical valve, coronary reconstruction and aortic root replacement at end June 2021 at [facility] was admitted under hospitalist service this morning after a fall which resulted in right femoral neck fracture.  Orthopedics as well as cardiology was consulted.  Patient's INR was subtherapeutic.  There is some question about noncompliance from patient.  He was supposed to be taking bridging Lovenox which she did not take either.  He was seen by cardiology today and he is deemed to be at least moderate if not severe risk candidate for perioperative risk.  Interestingly, per cardiology's note today, goal INR was supposed to be 2.3 for first 3 months after the surgery and then between 1.5-2 which is contrary to the normal wear Per up-to-date, patient with mechanical valve needs their INR to be between 2.5-3.  Patient seen and examined.  He complains of right hip pain but no pain as long as he is still.  He has no other complaint.  Further management per orthopedics.

## 2019-08-30 NOTE — Transfer of Care (Signed)
Immediate Anesthesia Transfer of Care Note  Patient: Barret Esquivel  Procedure(s) Performed: TOTAL HIP ARTHROPLASTY ANTERIOR APPROACH (Right Hip)  Patient Location: PACU  Anesthesia Type:General  Level of Consciousness: awake, alert  and oriented  Airway & Oxygen Therapy: Patient Spontanous Breathing  Post-op Assessment: Report given to RN and Post -op Vital signs reviewed and stable  Post vital signs: Reviewed and stable  Last Vitals:  Vitals Value Taken Time  BP 125/112 08/30/19 1639  Temp    Pulse 100 08/30/19 1642  Resp 18 08/30/19 1642  SpO2 95 % 08/30/19 1642  Vitals shown include unvalidated device data.  Last Pain:  Vitals:   08/30/19 0936  TempSrc: Oral  PainSc:          Complications: No complications documented.

## 2019-08-30 NOTE — Anesthesia Preprocedure Evaluation (Addendum)
Anesthesia Evaluation  Patient identified by MRN, date of birth, ID band Patient awake    Reviewed: Allergy & Precautions, NPO status , Patient's Chart, lab work & pertinent test results  History of Anesthesia Complications Negative for: history of anesthetic complications  Airway Mallampati: II  TM Distance: >3 FB Neck ROM: Full    Dental  (+) Dental Advisory Given, Teeth Intact,    Pulmonary neg recent URI, Current Smoker and Patient abstained from smoking.,    breath sounds clear to auscultation       Cardiovascular + Valvular Problems/Murmurs  Rhythm:Regular + Systolic Click 1. Normal function of mechanical aortic prosthesis. s/p redo root  replacement for recurrent infective endocarditis with Bentall procedure:  #27-25mm mechanical On-X valve, #32 Valsalva conduit via redo sternotomy  on 07/21/2019 at [facility]. AT 92 msec. DVI  0.69, EOA 2.9, iEOA 1.42 cm2/m2. Normal mechanical bileaflet motion. The  aortic valve has been repaired/replaced. Aortic valve regurgitation is  trivial, likely normal washing jet. There is a mechanical valved conduit  present in the aortic position.  Procedure Date: 07/21/19.  2. Left ventricular ejection fraction, by estimation, is 50 to 55%. The  left ventricle has low normal function. The left ventricle has no regional  wall motion abnormalities. There is mild left ventricular hypertrophy.  Left ventricular diastolic  parameters are indeterminate.  3. Right ventricular systolic function is normal. The right ventricular  size is normal. There is normal pulmonary artery systolic pressure. The  estimated right ventricular systolic pressure is 21.3 mmHg.  4. Right atrial size was mildly dilated.  5. The mitral valve has been repaired. Trivial mitral valve  regurgitation. No evidence of mitral stenosis. The mean mitral valve  gradient is 2.4 mmHg with average heart rate of 89 bpm. There is a repair    present in the mitral position. Procedure Date:  02/2018.  6. Aortic root/ascending aorta has been repaired/replaced.  7. The inferior vena cava is normal in size with greater than 50%  respiratory variability, suggesting right atrial pressure of 3 mmHg.    Neuro/Psych CVA, No Residual Symptoms negative psych ROS   GI/Hepatic   Endo/Other  negative endocrine ROS  Renal/GU Renal disease     Musculoskeletal negative musculoskeletal ROS (+)   Abdominal   Peds  Hematology  (+) HIV,   Anesthesia Other Findings S/p avr x1 folowed by avr/bentall 06/2019 for recurrent endocarditis  Reproductive/Obstetrics                            Anesthesia Physical Anesthesia Plan  ASA: II  Anesthesia Plan: General   Post-op Pain Management:    Induction: Intravenous  PONV Risk Score and Plan: 1 and Ondansetron and Dexamethasone  Airway Management Planned: Oral ETT  Additional Equipment: None  Intra-op Plan:   Post-operative Plan: Extubation in OR  Informed Consent: I have reviewed the patients History and Physical, chart, labs and discussed the procedure including the risks, benefits and alternatives for the proposed anesthesia with the patient or authorized representative who has indicated his/her understanding and acceptance.     Dental advisory given  Plan Discussed with: CRNA and Surgeon  Anesthesia Plan Comments:         Anesthesia Quick Evaluation

## 2019-08-30 NOTE — H&P (Signed)
History and Physical    James Ferrell OFV:[id number] DOB: 02-02-1975 DOA: 08/30/2019  PCP: Patient, No Pcp Per  Patient coming from: Home  I have personally briefly reviewed patient's old medical records in [facility]  Chief Complaint: Hip pain  HPI: James Ferrell is a 44 y.o. male with medical history significant of HIV on HAART. 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 Had MSSA endocarditis earlier this year, completed ABx, had repeat AVR with mechanical valve, coronary reconstruction and aortic root replacement at end June 2021 at [facility].  (full [facility] report available in care everywhere).  Pt is on coumadin with goal INR 2.5-3.5, though INRs have been running subtheraputic, INR was 1.2 on 7/30, he was prescribed lovenox bridging therapy but hadnt started taking the bridging therapy he reports.  Today had mechanical fall at home, he reports while helping wife.  Landed on R hip.  Severe R hip pain, deformity, inability to bear wt post fall.   ED Course: R femoral neck fx.  Also has INR 1.1.  Reports no LOC, no syncope, no CP, no SOB, no DOE or any other symptoms since surgery.   Review of Systems: As per HPI, otherwise all review of systems negative.  Past Medical History:  Diagnosis Date  . CHB (complete heart block) (HCC) 2020  . 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 H/O mechanical aortic valve replacement   . HIV (human immunodeficiency virus infection) (HCC)   . Stroke [facility])     Past Surgical History:  Procedure Laterality Date  . 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/21/2019   With root replacement  . BENTALL PROCEDURE  07/21/2019  . CARDIAC VALVE SURGERY    . MECHANICAL AORTIC VALVE REPLACEMENT  07/21/2019   Redo of valve surgery  . PACEMAKER IMPLANT    . TEE WITHOUT CARDIOVERSION N/A 05/13/2019   Procedure: TRANSESOPHAGEAL ECHOCARDIOGRAM (TEE);  Surgeon: Laurier Nancy, MD;   Location: [facility] ORS;  Service: Cardiovascular;  Laterality: N/A;     reports that he has been smoking. He has never used smokeless tobacco. He reports current alcohol use. No history on file for drug use.  No Known Allergies  Family History  Problem Relation Age of Onset  . Heart disease Neg Hx      Prior to Admission medications   Medication Sig Start Date End Date Taking? Authorizing Provider  emtricitabine-tenofovir (TRUVADA) 200-300 MG tablet Take 1 tablet by mouth daily. 06/23/19  Yes [provider]  potassium chloride SA (KLOR-CON) 20 MEQ tablet Take 20 mEq by mouth daily. 07/28/19  Yes [provider]  warfarin (COUMADIN) 2.5 MG tablet Take 2.5-5 mg by mouth See admin instructions. Take 2 tablets on Monday and Friday then take 1 tablet all the other days 07/28/19  Yes [provider]    Physical Exam: Vitals:   08/30/19 0138 08/30/19 0206  BP: 121/79   Pulse: 89   Resp: 19   Temp: 98.7 F (37.1 C)   TempSrc: Oral   SpO2: 100%   Weight:  83.9 kg  Height:  5\' 11"  (1.803 m)    Constitutional: NAD, calm, comfortable Eyes: PERRL, lids and conjunctivae normal ENMT: Mucous membranes are moist. Posterior pharynx clear of any exudate or lesions.Normal dentition.  Neck: normal, supple, no masses, no thyromegaly Respiratory: clear to auscultation bilaterally, no wheezing, no crackles. Normal respiratory effort. No accessory muscle use.  Cardiovascular: Regular rate and rhythm, no murmurs / rubs / gallops. No extremity edema. 2+ pedal pulses. No carotid bruits.  Abdomen: no tenderness, no masses palpated. No hepatosplenomegaly. Bowel sounds positive.  Musculoskeletal: TTP R hip. Skin: no rashes, lesions, ulcers. No induration Neurologic: CN 2-12 grossly intact. Sensation intact, DTR normal. Strength 5/5 in all 4.  Psychiatric: Normal judgment and insight. Alert and oriented x 3. Normal mood.    Labs on Admission: I have personally reviewed following labs  and imaging studies  CBC: Recent Labs  Lab 08/30/19 0315  WBC 14.6*  HGB 11.3*  HCT 38.1*  MCV 92.5  PLT 281   Basic Metabolic Panel: Recent Labs  Lab 08/30/19 0315  NA 140  K 3.3*  CL 108  CO2 18*  GLUCOSE 110*  BUN 10  CREATININE 1.16  CALCIUM 8.7*   GFR: Estimated Creatinine Clearance: 89.3 mL/min (by C-G formula based on SCr of 1.16 mg/dL). Liver Function Tests: No results for input(s): AST, ALT, ALKPHOS, BILITOT, PROT, ALBUMIN in the last 168 hours. No results for input(s): LIPASE, AMYLASE in the last 168 hours. No results for input(s): AMMONIA in the last 168 hours. Coagulation Profile: Recent Labs  Lab 08/30/19 0315  INR 1.1   Cardiac Enzymes: No results for input(s): CKTOTAL, CKMB, CKMBINDEX, TROPONINI in the last 168 hours. BNP (last 3 results) No results for input(s): PROBNP in the last 8760 hours. HbA1C: No results for input(s): HGBA1C in the last 72 hours. CBG: No results for input(s): GLUCAP in the last 168 hours. Lipid Profile: No results for input(s): CHOL, HDL, LDLCALC, TRIG, CHOLHDL, LDLDIRECT in the last 72 hours. Thyroid Function Tests: No results for input(s): TSH, T4TOTAL, FREET4, T3FREE, THYROIDAB in the last 72 hours. Anemia Panel: No results for input(s): VITAMINB12, FOLATE, FERRITIN, TIBC, IRON, RETICCTPCT in the last 72 hours. Urine analysis:    Component Value Date/Time   COLORURINE YELLOW (A) 05/09/2019 1516   APPEARANCEUR CLEAR (A) 05/09/2019 1516   LABSPEC 1.009 05/09/2019 1516   PHURINE 7.0 05/09/2019 1516   GLUCOSEU NEGATIVE 05/09/2019 1516   HGBUR NEGATIVE 05/09/2019 1516   BILIRUBINUR NEGATIVE 05/09/2019 1516   KETONESUR NEGATIVE 05/09/2019 1516   PROTEINUR NEGATIVE 05/09/2019 1516   NITRITE NEGATIVE 05/09/2019 1516   LEUKOCYTESUR NEGATIVE 05/09/2019 1516    Radiological Exams on Admission: DG Hip Unilat W or Wo Pelvis 2-3 Views Right  Result Date: 08/30/2019 CLINICAL DATA:  Fall on concrete EXAM: DG HIP (WITH OR  WITHOUT PELVIS) 2-3V RIGHT COMPARISON:  None. FINDINGS: There is a mildly displaced right transcervical femoral neck fracture. There is slight inferior subluxation of the femoral shaft. The femoral head is still within the acetabulum. No other definite fracture is identified. IMPRESSION: Mildly displaced right transcervical femoral neck fracture. Electronically Signed   By: Jonna Clark M.D.   On: 08/30/2019 03:25    EKG: Independently reviewed.  Assessment/Plan Principal Problem:   Closed displaced fracture of right femoral neck (HCC) Active Problems:   HIV (human immunodeficiency virus infection) (HCC)   Status post mechanical aortic valve replacement    1. R femoral neck fx - 1. Hip fx pathway 2. Pain ctrl per pathway 3. NPO 4. CXR and EKG pending 5. Ortho consulted and will see later this AM 6. Complicated recent cardiac history, see discussion below. 2. S/p mechanical AVR just last month 1. subtheraputic INR of 1.1 currently, looks like subtheraputic for at least the past week. 1. Coumadin will now be on hold for needed hip  fx repair surgery 2. Heparin gtt bridging therapy per pharm 2. Ordered 2d echo to evaluate 3. Message sent to [facility]. [facility] for cardiology to evaluate and see if they recommend any further work up or if hes good to go to OR. 3. HIV - Cont HAART  DVT prophylaxis: Heparin gtt bridging therapy Code Status: Full Family Communication: No family in room Disposition Plan: SNF vs CIR after admit Consults called: EDP spoke with ortho surgery, message sent to P. Trent for cards evaluation / clearance pre-op given recent complicated cardiac history Admission status: Admit to inpatient  Severity of Illness: The appropriate patient status for this patient is INPATIENT. Inpatient status is judged to be reasonable and necessary in order to provide the required intensity of service to ensure the patient's safety. The patient's presenting symptoms, physical exam findings, and  initial radiographic and laboratory data in the context of their chronic comorbidities is felt to place them at high risk for further clinical deterioration. Furthermore, it is not anticipated that the patient will be medically stable for discharge from the hospital within 2 midnights of admission. The following factors support the patient status of inpatient.   IP status for surgical repair of hip fx.  * I certify that at the point of admission it is my clinical judgment that the patient will require inpatient hospital care spanning beyond 2 midnights from the point of admission due to high intensity of service, high risk for further deterioration and high frequency of surveillance required.*    Yalanda Soderman M. DO [facility]  How to contact the [facility] Attending or Consulting provider 7A - 7P or covering provider during after hours 7P -7A, for this patient?  1. Check the care team in [facility] and look for a) attending/consulting [facility] provider listed and b) the [facility] team listed 2. Log into [email or web address]  Amion Physician Scheduling and messaging for groups and whole hospitals  On call and physician scheduling software for group practices, residents, hospitalists and other medical providers for call, clinic, rotation and shift schedules. OnCall Enterprise is a hospital-wide system for scheduling doctors and paging doctors on call. EasyPlot is for scientific plotting and data analysis.  [email or web address]  and use [facility]'s universal password to access. If you do not have the password, please contact the hospital operator.  3. Locate the [facility] provider you are looking for under [facility] and page to a number that you can be directly reached. 4. If you still have difficulty reaching the provider, please page the [facility] (Director on Call) for the Hospitalists listed on amion for assistance.  08/30/2019, 5:48 AM

## 2019-08-30 NOTE — Progress Notes (Signed)
ANTICOAGULATION CONSULT NOTE - Initial Consult  Pharmacy Consult for heparin Indication: Mechanical AVR  No Known Allergies  Patient Measurements: Height: 5\' 11"  (180.3 cm) Weight: 83.9 kg (184 lb 15.5 oz) IBW/kg (Calculated) : 75.3 Heparin Dosing Weight: 83.9 kg  Vital Signs: Temp: 98.7 F (37.1 C) (08/08 0138) Temp Source: Oral (08/08 0138) BP: 121/79 (08/08 0138) Pulse Rate: 89 (08/08 0138)  Labs: Recent Labs    08/30/19 0315  HGB 11.3*  HCT 38.1*  PLT 281  LABPROT 13.3  INR 1.1  CREATININE 1.16    Estimated Creatinine Clearance: 89.3 mL/min (by C-G formula based on SCr of 1.16 mg/dL).   Medical History: Past Medical History:  Diagnosis Date  . Stroke [facility])     Medications:  See medication history  Assessment: 44 yo man on coumadin PTA for AVR.  INR 1.1, Hg 11.3, PTLC 281 Plan to start heparin Goal of Therapy:  Heparin level 0.3-0.7 units/ml Monitor platelets by anticoagulation protocol: Yes   Plan:  Give 2000 units bolus x 1  Start heparin drip at 1200 units/hr Check heparin level 6-8 hours after start Daily HL and CBC while on heparin  Kamyia Thomason P[place]et 08/30/2019,5:12 AM

## 2019-08-30 NOTE — Op Note (Signed)
TOTAL HIP ARTHROPLASTY ANTERIOR APPROACH  Procedure Note James Ferrell   [id number]  Pre-op Diagnosis: right femoral neck fracture     Post-op Diagnosis: same   Operative Procedures  1. Total hip replacement; Right hip; uncemented [id number]   Personnel  Surgeon(s): Tarry Kos, MD  Assist: Oneal Grout, PA-C; necessary for the timely completion of procedure and due to complexity of procedure.   Anesthesia: general  Prosthesis: Depuy Acetabulum: Pinnacle 56 mm Femur: Actis 5 HO Head: 36 mm size: +1.5 Liner: lateralized Bearing Type: ceramic on poly  Total Hip Arthroplasty (Anterior Approach) Op Note:  After informed consent was obtained and the operative extremity marked in the holding area, the patient was brought back to the operating room and placed supine on the HANA table. Next, the operative extremity was prepped and draped in normal sterile fashion. Surgical timeout occurred verifying patient identification, surgical site, surgical procedure and administration of antibiotics.  A modified anterior Smith-Peterson approach to the hip was performed, using the interval between tensor fascia lata and sartorius.  Dissection was carried bluntly down onto the anterior hip capsule. The lateral femoral circumflex vessels were identified and coagulated. A capsulotomy was performed and there was no fracture hematoma present and the capsular flaps tagged for later repair.  The neck osteotomy was made distal to the fracture. The femoral head was removed, the acetabular rim was cleared of soft tissue and attention was turned to reaming the acetabulum.  Sequential reaming was performed under fluoroscopic guidance. We reamed to a size 56 mm, and then impacted the acetabular shell. A 30 mm cancellous screw was placed through the shell for added fixation.  The liner was then placed after irrigation and attention turned to the femur.  After placing the femoral hook, the leg was taken  to externally rotated, extended and adducted position taking care to perform soft tissue releases to allow for adequate mobilization of the femur. Soft tissue was cleared from the shoulder of the greater trochanter and the hook elevator used to improve exposure of the proximal femur. Sequential broaching performed up to a size 5. Trial neck and head were placed. The leg was brought back up to neutral and the construct reduced.  Antibiotic irrigation was placed in the surgical wound and kept for at least 1 minute.  The position and sizing of components, offset and leg lengths were checked using fluoroscopy. Stability of the construct was checked in extension and external rotation without any subluxation or impingement of prosthesis. We dislocated the prosthesis, dropped the leg back into position, removed trial components, and irrigated copiously. The final stem and head was then placed, the leg brought back up, the system reduced and fluoroscopy used to verify positioning.  We irrigated, obtained hemostasis and closed the capsule using #2 ethibond suture.  One gram of vancomycin powder was placed in the surgical bed. A dilute solution of 20 cc of normal saline, 1.3% exparel, 0.25% bupivacaine was injected in the soft tissues.  One gram of topical tranexamic acid was injected into the joint.  The fascia was closed with #1 vicryl plus, the deep fat layer was closed with 0 vicryl, the subcutaneous layers closed with 2.0 Vicryl Plus and the skin closed with staples. A sterile dressing was applied. The patient was awakened in the operating room and taken to recovery in stable condition.  All sponge, needle, and instrument counts were correct at the end of the case.   Position: supine  Complications: see description of  procedure.  Time Out: performed   Drains/Packing: none  Estimated blood loss: see anesthesia record  Returned to Recovery Room: in good condition.   Antibiotics: yes   Mechanical VTE (DVT)  Prophylaxis: sequential compression devices, TED thigh-high  Chemical VTE (DVT) Prophylaxis: per pharmacy   Fluid Replacement: see anesthesia record  Specimens Removed: 1 to pathology   Sponge and Instrument Count Correct? yes   PACU: portable radiograph - low AP   Plan/RTC: Return in 2 weeks for staple removal. Weight Bearing/Load Lower Extremity: full  Hip precautions: none Suture Removal: 2 weeks   N. Glee Arvin, MD [facility] 4:06 PM   Implant Name Type Inv. Item Serial No. Manufacturer Lot No. LRB No. Used Action  PINN SECTOR W/GRIP ACE CUP 56 - [id number] Hips PINN SECTOR W/GRIP ACE CUP 56  [employer] [id number] Right 1 Implanted  PINNACLE ALTRX PLUS 4 N 36X56 - [id number] Hips PINNACLE ALTRX PLUS 4 N 36X56  [employer] [id number] Right 1 Implanted  SCREW 6.5MMX30MM - [id number] Screw SCREW 6.5MMX30MM  [employer] [id number] Right 1 Implanted  STEM FEMORAL SZ5 HIGH ACTIS - [id number] Stem STEM FEMORAL SZ5 HIGH ACTIS  [employer] [id number] Right 1 Implanted  HEAD CERAMIC DELTA 36 PLUS 1.5 - [id number] Hips HEAD CERAMIC DELTA 36 PLUS 1.5  [employer] [id number] Right 1 Implanted
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
## 2019-08-30 NOTE — Progress Notes (Signed)
  Echocardiogram 2D Echocardiogram has been performed.  Gerda Diss 08/30/2019, 8:51 AM

## 2019-08-30 NOTE — Consult Note (Signed)
ORTHOPAEDIC CONSULTATION  REQUESTING PHYSICIAN: Hughie Closs, MD  Chief Complaint: Right femoral neck fracture  HPI:  James Ferrell is a 44 y.o. male with medical history significant of HIV on HAART. 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 Had MSSA endocarditis earlier this year, completed ABx, had repeat AVR with mechanical valve, coronary reconstruction and aortic root replacement at end June 2021 at [facility].  (full [facility] report available in care everywhere).  Pt is on coumadin with goal INR 2.5-3.5, though INRs have been running subtheraputic, INR was 1.2 on 7/30, he was prescribed lovenox bridging therapy but hadnt started taking the bridging therapy he reports.  Today had mechanical fall at home, he reports while helping wife.  Landed on R hip.  Severe R hip pain, deformity, inability to bear wt post fall.  Ortho consulted for surgical evaluation.  Past Medical History:  Diagnosis Date  . CHB (complete heart block) (HCC) 2020  . 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 H/O mechanical aortic valve replacement   . HIV (human immunodeficiency virus infection) (HCC)   . Stroke [facility])    Past Surgical History:  Procedure Laterality Date  . 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/21/2019   With root replacement  . BENTALL PROCEDURE  07/21/2019  . CARDIAC VALVE SURGERY    . MECHANICAL AORTIC VALVE REPLACEMENT  07/21/2019   Redo of valve surgery  . PACEMAKER IMPLANT    . TEE WITHOUT CARDIOVERSION N/A 05/13/2019   Procedure: TRANSESOPHAGEAL ECHOCARDIOGRAM (TEE);  Surgeon: Laurier Nancy, MD;  Location: [facility] ORS;  Service: Cardiovascular;  Laterality: N/A;   Social History   Socioeconomic History  . Marital status: Significant Other    Spouse name: Not on file  . Number of children: Not on file  . Years of education: Not on file  . Highest education level: Not on file  Occupational History  .  Not on file  Tobacco Use  . Smoking status: Current Every Day Smoker  . Smokeless tobacco: Never Used  Substance and Sexual Activity  . Alcohol use: Yes  . Drug use: Not on file  . Sexual activity: Not on file  Other Topics Concern  . Not on file  Social History Narrative  . Not on file   Social Determinants of Health   Financial Resource Strain:   . Difficulty of Paying Living Expenses:   Food Insecurity:   . Worried About Programme researcher, broadcasting/film/video in the Last Year:   . Barista in the Last Year:   Transportation Needs:   . Freight forwarder (Medical):   Marland Kitchen Lack of Transportation (Non-Medical):   Physical Activity:   . Days of Exercise per Week:   . Minutes of Exercise per Session:   Stress:   . Feeling of Stress :   Social Connections:   . Frequency of Communication with Friends and Family:   . Frequency of Social Gatherings with Friends and Family:   . Attends Religious Services:   . Active Member of Clubs or Organizations:   . Attends Banker Meetings:   Marland Kitchen Marital Status:    Family History  Problem Relation Age of Onset  . Heart disease Neg Hx    No Known Allergies Prior to Admission medications   Medication Sig Start Date End Date Taking? Authorizing Provider  emtricitabine-tenofovir (TRUVADA) 200-300 MG tablet Take 1 tablet  by mouth daily. 06/23/19  Yes [provider]  potassium chloride SA (KLOR-CON) 20 MEQ tablet Take 20 mEq by mouth daily. 07/28/19  Yes [provider]  warfarin (COUMADIN) 2.5 MG tablet Take 2.5-5 mg by mouth See admin instructions. Take 2 tablets on Monday and Friday then take 1 tablet all the other days 07/28/19  Yes [provider]   DG Chest 1 View  Result Date: 08/30/2019 CLINICAL DATA:  Preoperative evaluation EXAM: CHEST  1 VIEW COMPARISON:  05/09/2019 chest radiograph. FINDINGS: Intact sternotomy wires. Aortic valve prosthesis in place. Stable left chest pacemaker with leads overlying heart  bilaterally. Stable cardiomediastinal silhouette with mild cardiomegaly. No pneumothorax. No pleural effusion. No overt pulmonary edema. No acute consolidative airspace disease. IMPRESSION: Stable mild cardiomegaly without overt pulmonary edema. No active pulmonary disease. Electronically Signed   By: Delbert Phenix M.D.   On: 08/30/2019 08:29   ECHOCARDIOGRAM COMPLETE  Result Date: 08/30/2019    ECHOCARDIOGRAM REPORT   Patient Name:   James Ferrell Date of Exam: 08/30/2019 Medical Rec #:  [id number]        Height:       71.0 in Accession #:    [id number]       Weight:       185.0 lb Date of Birth:  [birth year]        BSA:          2.040 m Patient Age:    43 years         BP:           123/76 mmHg Patient Gender: M                HR:           89 bpm. Exam Location:  Inpatient Procedure: 2D Echo, Cardiac Doppler and Color Doppler Indications:    Aortic valve disorder  History:        Patient has prior history of Echocardiogram examinations, most                 recent 05/13/2019. Aortic Valve Disease; Risk Factors:Current                 Smoker. H/o mechanical aortic valve replacement and aortic root                 replacement.                 Aortic Valve: valve is present in the aortic position. Procedure                 Date: 07/21/19.  Sonographer:    Ross Ludwig RDCS (AE) Referring Phys: [phone] JARED M GARDNER  Sonographer Comments: Limited patient mobility due to right femoral neck fracture. IMPRESSIONS  1. Normal function of mechanical aortic prosthesis. s/p redo root replacement for recurrent infective endocarditis with Bentall procedure: #27-86mm mechanical On-X valve, #32 Valsalva conduit via redo sternotomy on 07/21/2019 at [facility]. AT 92 msec. DVI 0.69, EOA 2.9, iEOA 1.42 cm2/m2. Normal mechanical bileaflet motion. The aortic valve has been repaired/replaced. Aortic valve regurgitation is trivial, likely normal washing jet. There is a mechanical valved conduit present in the aortic position. Procedure Date:  07/21/19.  2. Left ventricular ejection fraction, by estimation, is 50 to 55%. The left ventricle has low normal function. The left ventricle has no regional wall motion abnormalities. There is mild left ventricular hypertrophy. Left ventricular diastolic parameters are indeterminate.  3. Right ventricular systolic function  is normal. The right ventricular size is normal. There is normal pulmonary artery systolic pressure. The estimated right ventricular systolic pressure is 21.3 mmHg.  4. Right atrial size was mildly dilated.  5. The mitral valve is grossly normal. Trivial mitral valve regurgitation. No evidence of mitral stenosis. The mean mitral valve gradient is 2.4 mmHg with average heart rate of 89 bpm.  6. Aortic root/ascending aorta has been repaired/replaced.  7. The inferior vena cava is normal in size with greater than 50% respiratory variability, suggesting right atrial pressure of 3 mmHg. FINDINGS  Left Ventricle: Left ventricular ejection fraction, by estimation, is 50 to 55%. The left ventricle has low normal function. The left ventricle has no regional wall motion abnormalities. The left ventricular internal cavity size was normal in size. There is mild left ventricular hypertrophy. Left ventricular diastolic parameters are indeterminate. Right Ventricle: The right ventricular size is normal. No increase in right ventricular wall thickness. Right ventricular systolic function is normal. There is normal pulmonary artery systolic pressure. The tricuspid regurgitant velocity is 2.14 m/s, and  with an assumed right atrial pressure of 3 mmHg, the estimated right ventricular systolic pressure is 21.3 mmHg. Left Atrium: Left atrial size was normal in size. Right Atrium: Right atrial size was mildly dilated. Pericardium: There is no evidence of pericardial effusion. Mitral Valve: The mitral valve is grossly normal. Mildly decreased mobility of the mitral valve leaflets. Trivial mitral valve regurgitation. No  evidence of mitral valve stenosis. MV peak gradient, 5.6 mmHg. The mean mitral valve gradient is 2.4 mmHg with average heart rate of 89 bpm. Tricuspid Valve: The tricuspid valve is not well visualized. Tricuspid valve regurgitation is trivial. No evidence of tricuspid stenosis. Aortic Valve: Normal function of mechanical aortic prosthesis. s/p redo root replacement for recurrent infective endocarditis with Bentall procedure: #27-88mm mechanical On-X valve, #32 Valsalva conduit via redo sternotomy on 07/21/2019 at [facility]. AT 92 msec. DVI 0.69, EOA 2.9, iEOA 1.42 cm2/m2. Normal mechanical bileaflet motion. The aortic valve has been repaired/replaced. Aortic valve regurgitation is trivial, likely normal washing jet. Aortic valve mean gradient measures 8.1 mmHg. Aortic valve peak gradient measures 17.2 mmHg. Aortic valve area, by VTI measures 2.63 cm. There is a valve present in the aortic position. Procedure Date: 07/21/19. Pulmonic Valve: The pulmonic valve was grossly normal. Pulmonic valve regurgitation is trivial. No evidence of pulmonic stenosis. Aorta: The aortic root/ascending aorta has been repaired/replaced. Venous: The inferior vena cava is normal in size with greater than 50% respiratory variability, suggesting right atrial pressure of 3 mmHg. IAS/Shunts: No atrial level shunt detected by color flow Doppler. Additional Comments: A pacer wire is visualized in the right atrium and right ventricle.  LEFT VENTRICLE PLAX 2D LVIDd:         5.70 cm LVIDs:         4.10 cm LV PW:         1.30 cm LV IVS:        1.00 cm LVOT diam:     2.30 cm LV SV:         93 LV SV Index:   45 LVOT Area:     4.15 cm  RIGHT VENTRICLE            IVC RV Basal diam:  3.20 cm    IVC diam: 1.70 cm RV S prime:     8.81 cm/s TAPSE (M-mode): 1.5 cm LEFT ATRIUM             Index  RIGHT ATRIUM           Index LA diam:        2.40 cm 1.18 cm/m  RA Area:     21.70 cm LA Vol (A2C):   47.6 ml 23.34 ml/m RA Volume:   69.80 ml  34.22 ml/m LA  Vol (A4C):   27.6 ml 13.53 ml/m LA Biplane Vol: 38.1 ml 18.68 ml/m  AORTIC VALVE AV Area (Vmax):    2.85 cm AV Area (Vmean):   2.54 cm AV Area (VTI):     2.63 cm AV Vmax:           207.11 cm/s AV Vmean:          143.646 cm/s AV VTI:            0.352 m AV Peak Grad:      17.2 mmHg AV Mean Grad:      8.1 mmHg LVOT Vmax:         142.00 cm/s LVOT Vmean:        87.700 cm/s LVOT VTI:          0.223 m LVOT/AV VTI ratio: 0.63  AORTA Ao Root diam: 4.80 cm Ao Asc diam:  3.30 cm MITRAL VALVE                TRICUSPID VALVE MV Area (PHT): 6.12 cm     TR Peak grad:   18.3 mmHg MV Peak grad:  5.6 mmHg     TR Vmax:        214.00 cm/s MV Mean grad:  2.4 mmHg MV Vmax:       1.18 m/s     SHUNTS MV Vmean:      79.4 cm/s    Systemic VTI:  0.22 m MV Decel Time: 124 msec     Systemic Diam: 2.30 cm MV E velocity: 105.00 cm/s MV A velocity: 76.70 cm/s MV E/A ratio:  1.37 Weston BrassGayatri Acharya MD Electronically signed by Weston BrassGayatri Acharya MD Signature Date/Time: 08/30/2019/9:28:30 AM    Final    DG Hip Unilat W or Wo Pelvis 2-3 Views Right  Result Date: 08/30/2019 CLINICAL DATA:  Fall on concrete EXAM: DG HIP (WITH OR WITHOUT PELVIS) 2-3V RIGHT COMPARISON:  None. FINDINGS: There is a mildly displaced right transcervical femoral neck fracture. There is slight inferior subluxation of the femoral shaft. The femoral head is still within the acetabulum. No other definite fracture is identified. IMPRESSION: Mildly displaced right transcervical femoral neck fracture. Electronically Signed   By: Jonna ClarkBindu  Avutu M.D.   On: 08/30/2019 03:25    All pertinent xrays, MRI, CT independently reviewed and interpreted  Positive ROS: All other systems have been reviewed and were otherwise negative with the exception of those mentioned in the HPI and as above.  Physical Exam: General: No acute distress Cardiovascular: No pedal edema Respiratory: No cyanosis, no use of accessory musculature GI: No organomegaly, abdomen is soft and non-tender Skin: No  lesions in the area of chief complaint Neurologic: Sensation intact distally Psychiatric: Patient is at baseline mood and affect Lymphatic: No axillary or cervical lymphadenopathy  MUSCULOSKELETAL:  - severe pain with movement of the hip and extremity - skin intact - NVI distally - compartments soft  Assessment: Right femoral neck fracture  Plan: - surgical treatment is recommended for pain relief, quality of life and early mobilization - patient and family are aware of r/b/a and wish to proceed, informed consent obtained - medical optimization per primary team -  surgery is planned for today - cardiology has seen and nothing more to do from their stand point  Thank you for the consult and the opportunity to see Mr. Evette Cristal Glee Arvin, MD [facility] 12:54 PM

## 2019-08-30 NOTE — Progress Notes (Signed)
ANTICOAGULATION CONSULT NOTE - Initial Consult  Pharmacy Consult for heparin Indication: Mechanical AVR  No Known Allergies  Patient Measurements: Height: 5\' 11"  (180.3 cm) Weight: 83.9 kg (184 lb 15.5 oz) IBW/kg (Calculated) : 75.3 Heparin Dosing Weight: 83.9 kg  Vital Signs: Temp: 98.2 F (36.8 C) (08/08 1755) Temp Source: Oral (08/08 1755) BP: 121/79 (08/08 1755) Pulse Rate: 90 (08/08 1755)  Labs: Recent Labs    08/30/19 0315  HGB 11.3*  HCT 38.1*  PLT 281  LABPROT 13.3  INR 1.1  CREATININE 1.16    Estimated Creatinine Clearance: 89.3 mL/min (by C-G formula based on SCr of 1.16 mg/dL).   Medical History: Past Medical History:  Diagnosis Date  . CHB (complete heart block) (HCC) 2020  . 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 H/O mechanical aortic valve replacement   . HIV (human immunodeficiency virus infection) (HCC)   . Stroke [facility])     Medications:  See medication history  Assessment: 44 yo man on coumadin PTA for AVR.  INR 1.1, Hg 11.3, PTLC 281  Heparin then turned off for OR for R hip replacement.  Pharmacy asked to start heparin and coumadin 24 hrs postoperatively by Dr. 46.  PTA warfarin dose 5 mg on Mon, Fri and 2.5 mg on all other days.  Plan to start heparin Goal of Therapy:  Heparin level 0.3-0.7 units/ml Monitor platelets by anticoagulation protocol: Yes   Plan:  Start heparin drip at 1000 units/hr tomorrow (8/9) at 1700 PM Check heparin level 6-8 hours after start Daily HL and CBC while on heparin Coumadin 5 mg po x 1 tomorrow at 5 PM. Daily INR.  12-09-1998, Reece Leader, BCCP Clinical Pharmacist  08/30/2019 8:20 PM   [facility] pharmacy phone numbers are listed on [email or web address]

## 2019-08-30 NOTE — Anesthesia Procedure Notes (Signed)
Procedure Name: Intubation Date/Time: 08/30/2019 2:47 PM Performed by: Clearnce Sorrel, CRNA Pre-anesthesia Checklist: Patient identified, Emergency Drugs available, Suction available, Patient being monitored and Timeout performed Patient Re-evaluated:Patient Re-evaluated prior to induction Oxygen Delivery Method: Circle system utilized Preoxygenation: Pre-oxygenation with 100% oxygen Induction Type: IV induction Ventilation: Mask ventilation without difficulty Laryngoscope Size: Mac and 4 Grade View: Grade I Tube type: Oral Tube size: 7.5 mm Number of attempts: 1 Airway Equipment and Method: Stylet Placement Confirmation: ETT inserted through vocal cords under direct vision,  positive ETCO2 and breath sounds checked- equal and bilateral Secured at: 23 cm Tube secured with: Tape Dental Injury: Teeth and Oropharynx as per pre-operative assessment
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
## 2019-08-31 ENCOUNTER — Encounter (HOSPITAL_COMMUNITY): Payer: Self-pay | Admitting: Orthopaedic Surgery

## 2019-08-31 ENCOUNTER — Encounter (HOSPITAL_COMMUNITY)
Admission: EM | Disposition: A | Discharge status: Home / Self Care | Payer: Self-pay | Source: Home / Self Care | Attending: Family Medicine

## 2019-08-31 DIAGNOSIS — S72001A Fracture of unspecified part of neck of right femur, initial encounter for closed fracture: Principal | ICD-10-CM

## 2019-08-31 LAB — BASIC METABOLIC PANEL
Anion gap: 10 (ref 5–15)
BUN: 7 mg/dL (ref 6–20)
CO2: 22 mmol/L (ref 22–32)
Calcium: 8.6 mg/dL — ABNORMAL LOW (ref 8.9–10.3)
Chloride: 106 mmol/L (ref 98–111)
Creatinine, Ser: 1.02 mg/dL (ref 0.61–1.24)
GFR calc Af Amer: >60 mL/min (ref >60)
GFR calc non Af Amer: >60 mL/min (ref >60)
Glucose, Bld: 127 mg/dL — ABNORMAL HIGH (ref 70–99)
Potassium: 4.2 mmol/L (ref 3.5–5.1)
Sodium: 138 mmol/L (ref 135–145)

## 2019-08-31 LAB — CBC
HCT: 35.4 % — ABNORMAL LOW (ref 39.0–52.0)
Hemoglobin: 10.6 g/dL — ABNORMAL LOW (ref 13.0–17.0)
MCH: 27.8 pg (ref 26.0–34.0)
MCHC: 29.9 g/dL — ABNORMAL LOW (ref 30.0–36.0)
MCV: 92.9 fL (ref 80.0–100.0)
Platelets: 243 10*3/uL (ref 150–400)
RBC: 3.81 MIL/uL — ABNORMAL LOW (ref 4.22–5.81)
RDW: 14.1 % (ref 11.5–15.5)
WBC: 13 10*3/uL — ABNORMAL HIGH (ref 4.0–10.5)
nRBC: 0 % (ref 0.0–0.2)

## 2019-08-31 SURGERY — ARTHROPLASTY, HIP, TOTAL, ANTERIOR APPROACH
Anesthesia: General | Site: Hip | Laterality: Right

## 2019-08-31 MED ORDER — ENSURE ENLIVE PO LIQD
237.0000 mL | Freq: Two times a day (BID) | ORAL | Status: DC
  Administered 2019-09-01 – 2019-09-02 (×3): 237 mL via ORAL

## 2019-08-31 MED ORDER — ADULT MULTIVITAMIN W/MINERALS CH
1.0000 | ORAL_TABLET | Freq: Every day | ORAL | Status: DC
  Administered 2019-08-31 – 2019-09-02 (×3): 1 via ORAL
  Filled 2019-08-31 (×3): qty 1

## 2019-08-31 NOTE — Progress Notes (Addendum)
Subjective: 1 Day Post-Op Procedure(s) (LRB): TOTAL HIP ARTHROPLASTY ANTERIOR APPROACH (Right) Patient reports pain as mild.    Objective: Vital signs in last 24 hours: Temp:  [97.9 F (36.6 C)-98.6 F (37 C)] 98.2 F (36.8 C) (08/09 0453) Pulse Rate:  [76-100] 89 (08/09 0453) Resp:  [14-32] 15 (08/09 0453) BP: (111-143)/(72-112) 111/78 (08/09 0453) SpO2:  [95 %-100 %] 100 % (08/09 0453)  Intake/Output from previous day: 08/08 0701 - 08/09 0700 In: 1710.8 [I.V.:1510.8; IV Piggyback:200] Out: 2000 [Urine:1750; Blood:250] Intake/Output this shift: No intake/output data recorded.  Recent Labs    08/30/19 0315 08/30/19 2101 08/31/19 0250  HGB 11.3* 11.2* 10.6*   Recent Labs    08/30/19 2101 08/31/19 0250  WBC 14.5* 13.0*  RBC 4.12* 3.81*  HCT 38.0* 35.4*  PLT 252 243   Recent Labs    08/30/19 0315 08/31/19 0250  NA 140 138  K 3.3* 4.2  CL 108 106  CO2 18* 22  BUN 10 7  CREATININE 1.16 1.02  GLUCOSE 110* 127*  CALCIUM 8.7* 8.6*   Recent Labs    08/30/19 0315  INR 1.1    Neurologically intact Neurovascular intact Sensation intact distally Intact pulses distally Dorsiflexion/Plantar flexion intact Incision: dressing C/D/I No cellulitis present Compartment soft   Assessment/Plan: 1 Day Post-Op Procedure(s) (LRB): TOTAL HIP ARTHROPLASTY ANTERIOR APPROACH (Right) Up with therapy  WBAT RLE ABLA-mild and stable DVT ppx- heparin bridge to [employer] 08/31/2019, 9:38 AM

## 2019-08-31 NOTE — Plan of Care (Signed)
  Problem: Education: Goal: Knowledge of General Education information will improve Description: Including pain rating scale, medication(s)/side effects and non-pharmacologic comfort measures Outcome: Progressing   Problem: Activity: Goal: Risk for activity intolerance will decrease Outcome: Progressing   Problem: Pain Managment: Goal: General experience of comfort will improve Outcome: Progressing   

## 2019-08-31 NOTE — Evaluation (Signed)
Occupational Therapy Evaluation Patient Details Name: James Ferrell MRN: [id number] DOB: 12-Dec-1975 Today's Date: 08/31/2019    History of Present Illness Pt is a 44 y.o. M with significant PMH of HIV, endocarditis of aortic valve, history of mechanical aortic valve replacement, and stroke who presents with a fall and right femoral neck fracture. S/p right THA, direct anterior approach.    Clinical Impression   Pt was independent prior to admission. Presents with R hip pain and impaired balance. He requires up to total assist for LB ADL. Educated pt in multiple uses of 3 in 1 and began education in compensatory strategies for ADL. Pt plans to discharge to a friend's home. Will follow acutely.    Follow Up Recommendations  No OT follow up    Equipment Recommendations  3 in 1 bedside commode    Recommendations for Other Services       Precautions / Restrictions Precautions Precautions: Fall;Sternal Precaution Comments: Recent re-do sternotomy 07/21/2019 Restrictions Weight Bearing Restrictions: Yes RLE Weight Bearing: Weight bearing as tolerated      Mobility Bed Mobility      General bed mobility comments: pt in chair  Transfers Overall transfer level: Needs assistance Equipment used: Rolling walker (2 wheeled) Transfers: Sit to/from Stand Sit to Stand: Min assist         General transfer comment: Light minA to boost from elevated surface, cues for hand placement    Balance Overall balance assessment: Needs assistance Sitting-balance support: Feet supported Sitting balance-Leahy Scale: Poor Sitting balance - Comments: leaning back due to pain   Standing balance support: Bilateral upper extremity supported Standing balance-Leahy Scale: Poor Standing balance comment: reliant on external support                           ADL either performed or assessed with clinical judgement   ADL Overall ADL's : Needs assistance/impaired Eating/Feeding:  Independent   Grooming: Set up;Sitting   Upper Body Bathing: Minimal assistance;Sitting   Lower Body Bathing: Total assistance;Sit to/from stand   Upper Body Dressing : Minimal assistance;Sitting   Lower Body Dressing: Total assistance;Sit to/from stand   Toilet Transfer: Minimal assistance;Ambulation;BSC Toilet Transfer Details (indicate cue type and reason): educated in use of 3 in 1 over toilet         Functional mobility during ADLs: Minimal assistance;Rolling walker       Vision Patient Visual Report: No change from baseline       Perception     Praxis      Pertinent Vitals/Pain Pain Assessment: Faces Faces Pain Scale: Hurts whole lot Pain Location: R hip with movement Pain Descriptors / Indicators: Grimacing;Guarding Pain Intervention(s): Ice applied;Monitored during session     Hand Dominance Right   Extremity/Trunk Assessment Upper Extremity Assessment Upper Extremity Assessment: Overall WFL for tasks assessed   Lower Extremity Assessment Lower Extremity Assessment: Defer to PT evaluation RLE Deficits / Details: Weak quad set, ankle dorsiflexion/plantarflexion WFL   Cervical / Trunk Assessment Cervical / Trunk Assessment: Normal   Communication Communication Communication: No difficulties   Cognition Arousal/Alertness: Awake/alert Behavior During Therapy: WFL for tasks assessed/performed Overall Cognitive Status: Within Functional Limits for tasks assessed                                     General Comments       Exercises Exercises: General Lower Extremity  General Exercises - Lower Extremity Ankle Circles/Pumps: Right;5 reps;Seated Quad Sets: Right;10 reps;Seated   Shoulder Instructions      Home Living Family/patient expects to be discharged to:: Private residence Living Arrangements: Non-relatives/Friends Available Help at Discharge: Friend(s) [name]) Type of Home: House Home Access: Stairs to enter [employer] of Steps: 2 Entrance Stairs-Rails: Right;Left Home Layout: One level     Bathroom Shower/Tub: Producer, television/film/video: Standard     Home Equipment: Environmental consultant - 4 wheels          Prior Functioning/Environment Level of Independence: Independent        Comments: Currently unemployed        OT Problem List: Decreased strength;Impaired balance (sitting and/or standing);Decreased knowledge of use of DME or AE;Pain      OT Treatment/Interventions: Self-care/ADL training;DME and/or AE instruction;Balance training;Patient/family education;Therapeutic activities    OT Goals(Current goals can be found in the care plan section) Acute Rehab OT Goals Patient Stated Goal: "walk normally." OT Goal Formulation: With patient Time For Goal Achievement: 09/14/19 Potential to Achieve Goals: Good ADL Goals Pt Will Perform Grooming: (P) with modified independence;standing Pt Will Perform Lower Body Bathing: (P) with modified independence;with adaptive equipment;sit to/from stand Pt Will Perform Lower Body Dressing: (P) with modified independence;with adaptive equipment;sit to/from stand Pt Will Transfer to Toilet: (P) with modified independence;ambulating;bedside commode (over toilet) Pt Will Perform Toileting - Clothing Manipulation and hygiene: (P) with modified independence;sit to/from stand Pt Will Perform Tub/Shower Transfer: (P) Tub transfer;with supervision;3 in 1;rolling walker  OT Frequency: Min 2X/week   Barriers to D/C:            Co-evaluation              AM-PAC OT "6 Clicks" Daily Activity     Outcome Measure Help from another person eating meals?: None Help from another person taking care of personal grooming?: None Help from another person toileting, which includes using toliet, bedpan, or urinal?: A Little Help from another person bathing (including washing, rinsing, drying)?: A Lot Help from another person to put on and taking off regular  upper body clothing?: A Little Help from another person to put on and taking off regular lower body clothing?: A Lot 6 Click Score: 18   End of Session    Activity Tolerance: Patient tolerated treatment well Patient left: in chair;with call bell/phone within reach  OT Visit Diagnosis: Unsteadiness on feet (R26.81);Other abnormalities of gait and mobility (R26.89);Pain;Muscle weakness (generalized) (M62.81)                Time: 0051-1021 OT Time Calculation (min): 20 min Charges:  OT General Charges $OT Visit: 1 Visit OT Evaluation $OT Eval Moderate Complexity: 1 Mod  Martie Round, OTR/L Acute Rehabilitation Services Pager: [phone] Office: [phone]  Evern Bio 08/31/2019, 11:40 AM

## 2019-08-31 NOTE — Progress Notes (Signed)
PROGRESS NOTE    James Ferrell  OZH:[id number] DOB: 1975-02-23 DOA: 08/30/2019 PCP: Patient, No Pcp Per   Brief Narrative:  HPI: James Ferrell is a 43 y.o. male with medical history significant of HIV on HAART. 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 Had MSSA endocarditis earlier this year, completed ABx, had repeat AVR with mechanical valve, coronary reconstruction and aortic root replacement at end June 2021 at [facility].  (full [facility] report available in care everywhere).  Pt is on coumadin with goal INR 2.5-3.5, though INRs have been running subtheraputic, INR was 1.2 on 7/30, he was prescribed lovenox bridging therapy but hadnt started taking the bridging therapy he reports.  Today had mechanical fall at home, he reports while helping wife.  Landed on R hip.  Severe R hip pain, deformity, inability to bear wt post fall.   ED Course: R femoral neck fx.  Also has INR 1.1.  Reports no LOC, no syncope, no CP, no SOB, no DOE or any other symptoms since surgery.  Assessment & Plan:   Principal Problem:   Closed displaced fracture of right femoral neck (HCC) Active Problems:   HIV (human immunodeficiency virus infection) (HCC)   Status post mechanical aortic valve replacement  Right femoral neck fracture: Status post total hip arthroplasty by orthopedics.  Management per them.  Pain control.  History of mechanical AVR: Cardiology evaluated patient preoperatively.  Thankfully he did well during the surgery.  Coumadin per pharmacy.  INR subtherapeutic yesterday.  He remains on bridging heparin.  HIV: Continue HAART  Leukocytosis: Nonspecific, no source of infection.  Monitor.  Anemia of chronic disease: Hemoglobin stable.  Monitor.  DVT prophylaxis: SCDs Start: 08/30/19 1759, Coumadin.  Heparin   Code Status: Full Code  Family Communication:  None present at bedside.  Plan of care discussed with patient in length and  he verbalized understanding and agreed with it.  Status is: Inpatient  Remains inpatient appropriate because:Inpatient level of care appropriate due to severity of illness   Dispo: The patient is from: Home              Anticipated d/c is to: Home              Anticipated d/c date is: 1 day              Patient currently is not medically stable to d/c.        Estimated body mass index is 25.8 kg/m as calculated from the following:   Height as of this encounter: 5\' 11"  (1.803 m).   Weight as of this encounter: 83.9 kg.      Nutritional status:  Nutrition Problem: Increased nutrient needs Etiology: post-op healing   Signs/Symptoms: estimated needs   Interventions: Ensure Enlive (each supplement provides 350kcal and 20 grams of protein), MVI    Consultants:   Orthopedics and cardiology  Procedures:   Total hip arthroplasty right 08/30/2019  Antimicrobials:  Anti-infectives (From admission, onward)   Start     Dose/Rate Route Frequency Ordered Stop   08/30/19 2100  ceFAZolin (ANCEF) IVPB 2g/100 mL premix        2 g 200 mL/hr over 30 Minutes Intravenous Every 6 hours 08/30/19 1758 08/31/19 1043   08/30/19 1510  vancomycin (VANCOCIN) powder  Status:  Discontinued          As needed 08/30/19 1510 08/30/19 1628   08/30/19 1425  ceFAZolin (ANCEF) 2-4  GM/100ML-% IVPB       Note to Pharmacy: Shiela Mayer, Tammy   : cabinet override      08/30/19 1425 08/31/19 0229   08/30/19 1000  emtricitabine-tenofovir AF (DESCOVY) 200-25 MG per tablet 1 tablet     Discontinue     1 tablet Oral Daily 08/30/19 0506           Subjective: Seen and examined.  Minimal pain in the right hip.  Otherwise doing well.  No complaints.  Objective: Vitals:   08/30/19 1755 08/30/19 2039 08/31/19 0000 08/31/19 0453  BP: 121/79 (!) 143/89 136/72 111/78  Pulse: 90 90 76 89  Resp:  15 14 15   Temp: 98.2 F (36.8 C) 98.6 F (37 C) 98 F (36.7 C) 98.2 F (36.8 C)  TempSrc: Oral Oral Oral  Oral  SpO2: 100% 100% 100% 100%  Weight:      Height:        Intake/Output Summary (Last 24 hours) at 08/31/2019 1349 Last data filed at 08/31/2019 0600 Gross per 24 hour  Intake 1710.75 ml  Output 1050 ml  Net 660.75 ml   Filed Weights   08/30/19 0206  Weight: 83.9 kg    Examination:  General exam: Appears calm and comfortable  Respiratory system: Clear to auscultation. Respiratory effort normal. Cardiovascular system: S1 & S2 heard, RRR. No JVD, murmurs, rubs, gallops or clicks. No pedal edema. Gastrointestinal system: Abdomen is nondistended, soft and nontender. No organomegaly or masses felt. Normal bowel sounds heard. Central nervous system: Alert and oriented. No focal neurological deficits. Skin: No rashes, lesions or ulcers Psychiatry: Judgement and insight appear normal. Mood & affect appropriate.    Data Reviewed: I have personally reviewed following labs and imaging studies  CBC: Recent Labs  Lab 08/30/19 0315 08/30/19 2101 08/31/19 0250  WBC 14.6* 14.5* 13.0*  HGB 11.3* 11.2* 10.6*  HCT 38.1* 38.0* 35.4*  MCV 92.5 92.2 92.9  PLT 281 252 243   Basic Metabolic Panel: Recent Labs  Lab 08/30/19 0315 08/31/19 0250  NA 140 138  K 3.3* 4.2  CL 108 106  CO2 18* 22  GLUCOSE 110* 127*  BUN 10 7  CREATININE 1.16 1.02  CALCIUM 8.7* 8.6*   GFR: Estimated Creatinine Clearance: 101.5 mL/min (by C-G formula based on SCr of 1.02 mg/dL). Liver Function Tests: No results for input(s): AST, ALT, ALKPHOS, BILITOT, PROT, ALBUMIN in the last 168 hours. No results for input(s): LIPASE, AMYLASE in the last 168 hours. No results for input(s): AMMONIA in the last 168 hours. Coagulation Profile: Recent Labs  Lab 08/30/19 0315  INR 1.1   Cardiac Enzymes: No results for input(s): CKTOTAL, CKMB, CKMBINDEX, TROPONINI in the last 168 hours. BNP (last 3 results) No results for input(s): PROBNP in the last 8760 hours. HbA1C: No results for input(s): HGBA1C in the last  72 hours. CBG: No results for input(s): GLUCAP in the last 168 hours. Lipid Profile: No results for input(s): CHOL, HDL, LDLCALC, TRIG, CHOLHDL, LDLDIRECT in the last 72 hours. Thyroid Function Tests: No results for input(s): TSH, T4TOTAL, FREET4, T3FREE, THYROIDAB in the last 72 hours. Anemia Panel: No results for input(s): VITAMINB12, FOLATE, FERRITIN, TIBC, IRON, RETICCTPCT in the last 72 hours. Sepsis Labs: No results for input(s): PROCALCITON, LATICACIDVEN in the last 168 hours.  Recent Results (from the past 240 hour(s))  SARS Coronavirus 2 by RT PCR (hospital order, performed in [facility] hospital lab) Nasopharyngeal Nasopharyngeal Swab     Status: None   Collection  Time: 08/30/19  3:53 AM   Specimen: Nasopharyngeal Swab  Result Value Ref Range Status   SARS Coronavirus 2 NEGATIVE NEGATIVE Final    Comment: (NOTE) SARS-CoV-2 target nucleic acids are NOT DETECTED.  The SARS-CoV-2 RNA is generally detectable in upper and lower respiratory specimens during the acute phase of infection. The lowest concentration of SARS-CoV-2 viral copies this assay can detect is 250 copies / mL. A negative result does not preclude SARS-CoV-2 infection and should not be used as the sole basis for treatment or other patient management decisions.  A negative result may occur with improper specimen collection / handling, submission of specimen other than nasopharyngeal swab, presence of viral mutation(s) within the areas targeted by this assay, and inadequate number of viral copies (<250 copies / mL). A negative result must be combined with clinical observations, patient history, and epidemiological information.  Fact Sheet for Patients:   BoilerBrush.com.cy  Fact Sheet for Healthcare Providers: https://pope.com/  This test is not yet approved or  cleared by the [place] FDA and has been authorized for detection and/or diagnosis of  SARS-CoV-2 by FDA under an Emergency Use Authorization (EUA).  This EUA will remain in effect (meaning this test can be used) for the duration of the COVID-19 declaration under Section 564(b)(1) of the Act, 21 U.S.C. section 360bbb-3(b)(1), unless the authorization is terminated or revoked sooner.  Performed at [facility], [address], [place], [place] [address]   MRSA PCR Screening     Status: None   Collection Time: 08/30/19  1:03 PM   Specimen: Nasal Mucosa; Nasopharyngeal  Result Value Ref Range Status   MRSA by PCR NEGATIVE NEGATIVE Final    Comment:        The GeneXpert MRSA Assay (FDA approved for NASAL specimens only), is one component of a comprehensive MRSA colonization surveillance program. It is not intended to diagnose MRSA infection nor to guide or monitor treatment for MRSA infections. Performed at [facility], [address] [place], [place] [address]       Radiology Studies: DG Chest 1 View  Result Date: 08/30/2019 CLINICAL DATA:  Preoperative evaluation EXAM: CHEST  1 VIEW COMPARISON:  05/09/2019 chest radiograph. FINDINGS: Intact sternotomy wires. Aortic valve prosthesis in place. Stable left chest pacemaker with leads overlying heart bilaterally. Stable cardiomediastinal silhouette with mild cardiomegaly. No pneumothorax. No pleural effusion. No overt pulmonary edema. No acute consolidative airspace disease. IMPRESSION: Stable mild cardiomegaly without overt pulmonary edema. No active pulmonary disease. Electronically Signed   By: Delbert Phenix M.D.   On: 08/30/2019 08:29   DG Pelvis Portable  Result Date: 08/30/2019 CLINICAL DATA:  Post hip replacement EXAM: PORTABLE PELVIS 1-2 VIEWS COMPARISON:  None. FINDINGS: Right hip replacement. Normal alignment. No hardware bony complicating feature. IMPRESSION: Right hip replacement.  No visible complicating feature. Electronically Signed   By: Charlett Nose M.D.   On: 08/30/2019 18:54   DG C-Arm 1-60  Min  Result Date: 08/30/2019 CLINICAL DATA:  Right hip replacement EXAM: OPERATIVE RIGHT HIP (WITH PELVIS IF PERFORMED) 4 VIEWS TECHNIQUE: Fluoroscopic spot image(s) were submitted for interpretation post-operatively. COMPARISON:  None. FINDINGS: Changes of right hip replacement. Normal AP alignment. No hardware bony complicating feature. IMPRESSION: Right hip replacement.  No visible complicating feature. Electronically Signed   By: Charlett Nose M.D.   On: 08/30/2019 16:40   ECHOCARDIOGRAM COMPLETE  Result Date: 08/30/2019    ECHOCARDIOGRAM REPORT   Patient Name:   James Ferrell Date  of Exam: 08/30/2019 Medical Rec #:  [id number]        Height:       71.0 in Accession #:    [id number]       Weight:       185.0 lb Date of Birth:  [birth year]        BSA:          2.040 m Patient Age:    43 years         BP:           123/76 mmHg Patient Gender: M                HR:           89 bpm. Exam Location:  Inpatient Procedure: 2D Echo, Cardiac Doppler and Color Doppler                              MODIFIED REPORT: This report was modified by Weston Brass MD on 08/30/2019 due to revision.  Indications:     Aortic valve disorder  History:         Patient has prior history of Echocardiogram examinations, most                  recent 05/13/2019. Aortic Valve Disease; Risk Factors:Current                  Smoker. H/o mechanical aortic valve replacement and aortic root                  replacement.                  Aortic Valve: valve is present in the aortic position.                  Procedure Date: 07/21/19.                  Mitral Valve: repair. Procedure Date: 02/2018.  Sonographer:     Ross Ludwig RDCS (AE) Referring Phys:  [id number] GARDNER Diagnosing Phys: Weston Brass MD  Sonographer Comments: Limited patient mobility due to right femoral neck fracture. IMPRESSIONS  1. Normal function of mechanical aortic prosthesis. 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 AT 92 msec. DVI 0.69, EOA 2.9, iEOA 1.42 cm2/m2. Normal mechanical bileaflet motion. The aortic valve has been repaired/replaced. Aortic valve regurgitation is trivial, likely normal washing jet. There is a mechanical valved conduit present in the aortic position. Procedure Date: 07/21/19.  2. Left ventricular ejection fraction, by estimation, is 50 to 55%. The left ventricle has low normal function. The left ventricle has no regional wall motion abnormalities. There is mild left ventricular hypertrophy. Left ventricular diastolic parameters are indeterminate.  3. Right ventricular systolic function is normal. The right ventricular size is normal. There is normal pulmonary artery systolic pressure. The estimated right ventricular systolic pressure is 21.3 mmHg.  4. Right atrial size was mildly dilated.  5. The mitral valve has been repaired. Trivial mitral valve regurgitation. No evidence of mitral stenosis. The mean mitral valve gradient is 2.4 mmHg with average heart rate of 89 bpm. There is a repair present in the mitral position. Procedure Date: 02/2018.  6. Aortic root/ascending aorta has been repaired/replaced.  7. The inferior vena cava is normal in size  with greater than 50% respiratory variability, suggesting right atrial pressure of 3 mmHg. FINDINGS  Left Ventricle: Left ventricular ejection fraction, by estimation, is 50 to 55%. The left ventricle has low normal function. The left ventricle has no regional wall motion abnormalities. The left ventricular internal cavity size was normal in size. There is mild left ventricular hypertrophy. Left ventricular diastolic parameters are indeterminate. Right Ventricle: The right ventricular size is normal. No increase in right ventricular wall thickness. Right ventricular systolic function is normal. There is normal pulmonary artery systolic pressure. The tricuspid regurgitant  velocity is 2.14 m/s, and  with an assumed right atrial pressure of 3 mmHg, the estimated right ventricular systolic pressure is 21.3 mmHg. Left Atrium: Left atrial size was normal in size. Right Atrium: Right atrial size was mildly dilated. Pericardium: There is no evidence of pericardial effusion. Mitral Valve: The mitral valve has been repaired/replaced. Mildly decreased mobility of the mitral valve leaflets. Trivial mitral valve regurgitation. There is a present in the mitral position. Procedure Date: 02/2018. No evidence of mitral valve stenosis. MV peak gradient, 5.6 mmHg. The mean mitral valve gradient is 2.4 mmHg with average heart rate of 89 bpm. Tricuspid Valve: The tricuspid valve is not well visualized. Tricuspid valve regurgitation is trivial. No evidence of tricuspid stenosis. Aortic Valve: Normal function of mechanical aortic prosthesis. 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 AT 92 msec. DVI 0.69, EOA 2.9, iEOA 1.42 cm2/m2. Normal mechanical bileaflet motion. The aortic valve has been repaired/replaced. Aortic valve regurgitation is trivial, likely normal washing jet. Aortic valve mean gradient measures 8.1 mmHg. Aortic valve peak gradient measures 17.2 mmHg. Aortic valve area, by VTI measures 2.63 cm. There is a valve present in the aortic position. Procedure Date: 07/21/19. Pulmonic Valve: The pulmonic valve was grossly normal. Pulmonic valve regurgitation is trivial. No evidence of pulmonic stenosis. Aorta: The aortic root/ascending aorta has been repaired/replaced. Venous: The inferior vena cava is normal in size with greater than 50% respiratory variability, suggesting right atrial pressure of 3 mmHg. IAS/Shunts: No atrial level shunt detected by color flow Doppler. Additional Comments: A pacer wire is visualized in the right atrium and right ventricle.   LEFT VENTRICLE PLAX 2D LVIDd:         5.70 cm LVIDs:         4.10 cm LV PW:         1.30 cm LV IVS:        1.00 cm LVOT diam:     2.30 cm LV SV:         93 LV SV Index:   45 LVOT Area:     4.15 cm  RIGHT VENTRICLE            IVC RV Basal diam:  3.20 cm    IVC diam: 1.70 cm RV S prime:     8.81 cm/s TAPSE (M-mode): 1.5 cm LEFT ATRIUM             Index       RIGHT ATRIUM           Index LA diam:        2.40 cm 1.18 cm/m  RA Area:     21.70 cm LA Vol (A2C):   47.6 ml 23.34 ml/m RA Volume:   69.80 ml  34.22 ml/m LA Vol (A4C):   27.6 ml 13.53 ml/m LA Biplane  Vol: 38.1 ml 18.68 ml/m  AORTIC VALVE AV Area (Vmax):    2.85 cm AV Area (Vmean):   2.54 cm AV Area (VTI):     2.63 cm AV Vmax:           207.11 cm/s AV Vmean:          143.646 cm/s AV VTI:            0.352 m AV Peak Grad:      17.2 mmHg AV Mean Grad:      8.1 mmHg LVOT Vmax:         142.00 cm/s LVOT Vmean:        87.700 cm/s LVOT VTI:          0.223 m LVOT/AV VTI ratio: 0.63  AORTA Ao Root diam: 4.80 cm Ao Asc diam:  3.30 cm MITRAL VALVE                TRICUSPID VALVE MV Area (PHT): 6.12 cm     TR Peak grad:   18.3 mmHg MV Peak grad:  5.6 mmHg     TR Vmax:        214.00 cm/s MV Mean grad:  2.4 mmHg MV Vmax:       1.18 m/s     SHUNTS MV Vmean:      79.4 cm/s    Systemic VTI:  0.22 m MV Decel Time: 124 msec     Systemic Diam: 2.30 cm MV E velocity: 105.00 cm/s MV A velocity: 76.70 cm/s MV E/A ratio:  1.37 Weston Brass MD Electronically signed by Weston Brass MD Signature Date/Time: 08/30/2019/9:28:30 AM    Final (Updated)    DG HIP OPERATIVE UNILAT W OR W/O PELVIS RIGHT  Result Date: 08/30/2019 CLINICAL DATA:  Right hip replacement EXAM: OPERATIVE RIGHT HIP (WITH PELVIS IF PERFORMED) 4 VIEWS TECHNIQUE: Fluoroscopic spot image(s) were submitted for interpretation post-operatively. COMPARISON:  None. FINDINGS: Changes of right hip replacement. Normal AP alignment. No hardware bony complicating feature. IMPRESSION: Right hip replacement.  No visible  complicating feature. Electronically Signed   By: Charlett Nose M.D.   On: 08/30/2019 16:40   DG Hip Unilat W or Wo Pelvis 2-3 Views Right  Result Date: 08/30/2019 CLINICAL DATA:  Fall on concrete EXAM: DG HIP (WITH OR WITHOUT PELVIS) 2-3V RIGHT COMPARISON:  None. FINDINGS: There is a mildly displaced right transcervical femoral neck fracture. There is slight inferior subluxation of the femoral shaft. The femoral head is still within the acetabulum. No other definite fracture is identified. IMPRESSION: Mildly displaced right transcervical femoral neck fracture. Electronically Signed   By: Jonna Clark M.D.   On: 08/30/2019 03:25    Scheduled Meds: . acetaminophen  500 mg Oral Q6H  . docusate sodium  100 mg Oral BID  . emtricitabine-tenofovir AF  1 tablet Oral Daily  . feeding supplement (ENSURE ENLIVE)  237 mL Oral BID BM  . multivitamin with minerals  1 tablet Oral Daily  . warfarin  5 mg Oral Once  . Warfarin - Pharmacist Dosing Inpatient   Does not apply q1600   Continuous Infusions: . sodium chloride 75 mL/hr at 08/31/19 0700  . heparin    . methocarbamol (ROBAXIN) IV       LOS: 1 day   Time spent: 30 minutes   Hughie Closs, MD [facility]  08/31/2019, 1:49 PM   To contact the attending provider between 7A-7P or the covering provider during after hours 7P-7A, please log into  the web site [email or web address].

## 2019-08-31 NOTE — Progress Notes (Signed)
Initial Nutrition Assessment  DOCUMENTATION CODES:   Not applicable  INTERVENTION:   -Ensure Enlive po BID, each supplement provides 350 kcal and 20 grams of protein -MVI with minerals daily  NUTRITION DIAGNOSIS:   Increased nutrient needs related to post-op healing as evidenced by estimated needs.  GOAL:   Patient will meet greater than or equal to 90% of their needs  MONITOR:   PO intake, Supplement acceptance, Labs, Weight trends, Skin, I & O's  REASON FOR ASSESSMENT:   Consult Hip fracture protocol  ASSESSMENT:   James Ferrell is a 44 y.o. male with medical history significant of HIV on HAART. Admitted with rt hip fracture s/p fall.  Pt admitted with rt femoral neck fracture s/p fall.   8/9- s/p Total hip replacement; Right hip; uncemented [id number]   Reviewed I/O's: -289 ml x 24 hours  UOP: 1.8 L x 24 hours  Spoke with pt at significant other at bedside. Pt reports feeling better today and just recently worked with therapy. He did not eat breakfast this morning, due to not feeling hungry. He was eating fruit snacks at time of visit. RD reviewed menu with pt; he said he would look over it and call down after visit.   PTA, pt reports good appetite, consuming 3 meals per day consisting of a meat, starch, and vegetable.   Pt denies any weight loss. He reports UBW is around 205#.   Discussed with pt importance of good meal and supplement intake to promote healing.   Medications reviewed and include colace and descovy.   Labs reviewed.   NUTRITION - FOCUSED PHYSICAL EXAM:    Most Recent Value  Orbital Region No depletion  Upper Arm Region No depletion  Thoracic and Lumbar Region No depletion  Buccal Region No depletion  Temple Region No depletion  Clavicle Bone Region No depletion  Clavicle and Acromion Bone Region No depletion  Scapular Bone Region No depletion  Dorsal Hand No depletion  Patellar Region No depletion  Anterior Thigh Region No  depletion  Posterior Calf Region No depletion  Edema (RD Assessment) None  Hair Reviewed  Eyes Reviewed  Mouth Reviewed  Skin Reviewed  Nails Reviewed       Diet Order:   Diet Order            Diet Heart Room service appropriate? Yes; Fluid consistency: Thin  Diet effective now                 EDUCATION NEEDS:   Education needs have been addressed  Skin:  Skin Assessment: Skin Integrity Issues: Skin Integrity Issues:: Incisions Incisions: rt hip  Last BM:  08/29/19  Height:   Ht Readings from Last 1 Encounters:  08/30/19 5\' 11"  (1.803 m)    Weight:   Wt Readings from Last 1 Encounters:  08/30/19 83.9 kg    Ideal Body Weight:  78.2 kg  BMI:  Body mass index is 25.8 kg/m.  Estimated Nutritional Needs:   Kcal:  2100-2300  Protein:  110-125 grams  Fluid:  > 2.1 L    10/30/19, RD, LDN, CDCES Registered Dietitian II Certified Diabetes Care and Education Specialist Please refer to [facility] for RD and/or RD on-call/weekend/after hours pager

## 2019-08-31 NOTE — TOC Initial Note (Signed)
Transition of Care [facility]) - Initial/Assessment Note    Patient Details  Name: James Ferrell MRN: [id number] Date of Birth: 1975/09/28  Transition of Care [facility]) CM/SW Contact:    Curlene Labrum, RN Phone Number: 08/31/2019, 3:32 PM  Clinical Narrative:                 Case management met with the patient, S/P fall at home and Right femoral neck fracture and repair by Dr. Erlinda Hong on 08/30/19.  Patient lives with his girlfriend in [place], [place].  He sees a PCP at [facility] in [place], [place].  Patient uses [facility] and [facility] to get to physician appointments.  The patient was scheduled for outpatient PT at [facility] per PT/Ot evaluation.  I called [employer] and 3:1 and rolling walker were ordered to be delivered to the room.  I called Dr. Doristine Bosworth and was given permission to have patient placed on the list to receive the [employer] vaccine while he is here at [facility] since his vaccine was due to be given on 09/03/2019.  Patient placed on inpatient list for vaccine.  Will continue to follow for discharge.  Expected Discharge Plan: OP Rehab Barriers to Discharge: No Barriers Identified   Patient Goals and CMS Choice Patient states their goals for this hospitalization and ongoing recovery are:: Patient plans to discharge home with girlfriend - Lattie Haw is driving him home since his girfriend does not drive. CMS Medicare.gov Compare Post Acute Care list provided to:: Patient Choice offered to / list presented to : Patient  Expected Discharge Plan and Services Expected Discharge Plan: OP Rehab   Discharge Planning Services: CM Consult Post Acute Care Choice: Durable Medical Equipment Living arrangements for the past 2 months: Single Family Home                 DME Arranged: 3-N-1, Walker rolling DME Agency: [employer] Date DME Agency Contacted: 08/31/19 Time DME Agency Contacted: [phone] Representative spoke with at DME Agency: Thedore Mins, [employer]            Prior Living Arrangements/Services Living arrangements for the  past 2 months: [place] with:: Significant Other Patient language and need for interpreter reviewed:: Yes Do you feel safe going back to the place where you live?: Yes      Need for Family Participation in Patient Care: Yes (Comment) Care giver support system in place?: Yes (comment)   Criminal Activity/Legal Involvement Pertinent to Current Situation/Hospitalization: No - Comment as needed  Activities of Daily Living Home Assistive Devices/Equipment: None ADL Screening (condition at time of admission) Patient's cognitive ability adequate to safely complete daily activities?: Yes Is the patient deaf or have difficulty hearing?: No Does the patient have difficulty seeing, even when wearing glasses/contacts?: No Does the patient have difficulty concentrating, remembering, or making decisions?: No Patient able to express need for assistance with ADLs?: No Does the patient have difficulty dressing or bathing?: No Independently performs ADLs?: No Communication: Independent Dressing (OT): Independent Is this a change from baseline?: Pre-admission baseline Grooming: Independent Feeding: Independent Bathing: Independent Toileting: Independent In/Out Bed: Independent Walks in Home: Independent Does the patient have difficulty walking or climbing stairs?: No Weakness of Legs: None Weakness of Arms/Hands: None  Permission Sought/Granted Permission sought to share information with : Case Manager Permission granted to share information with : Yes, Verbal Permission Granted     Permission granted to share info w AGENCY: [employer]  Permission granted to share info w Relationship: girlfriend - Public house manager  Emotional Assessment Appearance:: Appears stated age Attitude/Demeanor/Rapport: Gracious Affect (typically observed): Accepting Orientation: : Oriented to Self, Oriented to Place, Oriented to  Time, Oriented to Situation Alcohol / Substance Use: Not Applicable Psych  Involvement: No (comment)  Admission diagnosis:  Pre-op evaluation [Z01.818] Closed fracture of neck of right femur, initial encounter ([place]) [S72.001A] Closed displaced fracture of right femoral neck ([facility]) [S72.001A] Patient Active Problem List   Diagnosis Date Noted  . Status post mechanical aortic valve replacement 08/30/2019  . Closed displaced fracture of right femoral neck ([place]) 08/30/2019  . HIV (human immunodeficiency virus infection) ([place]) 05/20/2019  . Bacteremia due to Staphylococcus aureus 05/10/2019  . Sepsis ([place]) 05/09/2019  . Leukocytosis 05/09/2019  . Hypotension 05/09/2019  . AKI (acute kidney injury) ([place]) 05/09/2019   PCP:  Patient, No Pcp Per Pharmacy:   [id number] [id number]: [phone]: [phone]    Social Determinants of Health (SDOH) Interventions    Readmission Risk Interventions Readmission Risk Prevention Plan 08/31/2019  Transportation Screening Complete  PCP or Specialist Appt within 5-7 Days Complete  Home Care Screening Complete  Medication Review (RN CM) Complete

## 2019-08-31 NOTE — Evaluation (Signed)
Physical Therapy Evaluation Patient Details Name: James Ferrell MRN: [id number] DOB: 1975-04-01 Today's Date: 08/31/2019   History of Present Illness  Pt is a 44 y.o. M with significant PMH of HIV, endocarditis of aortic valve, history of mechanical aortic valve replacement, and stroke who presents with a fall and right femoral neck fracture. S/p right THA, direct anterior approach.   Clinical Impression  Pt admitted s/p procedure listed above. Presents with decreased functional mobility in setting of right hip pain, RLE weakness and gait abnormalities. Requiring min assist for functional mobility. Ambulating 5 feet with a walker. Further distance deferred due to pain (RN present to provide pain medication at end of session). Education initiated regarding HEP. Suspect pt will progress well given age, PLOF, and motivation. Recommending OPPT at discharge.     Follow Up Recommendations Outpatient PT;Supervision for mobility/OOB    Equipment Recommendations  Rolling walker with 5" wheels;3in1 (PT)    Recommendations for Other Services       Precautions / Restrictions Precautions Precautions: Fall;Sternal Precaution Comments: Recent re-do sternotomy 07/21/2019 Restrictions Weight Bearing Restrictions: Yes RLE Weight Bearing: Weight bearing as tolerated      Mobility  Bed Mobility Overal bed mobility: Needs Assistance Bed Mobility: Supine to Sit     Supine to sit: Min assist     General bed mobility comments: MinA for RLE negotiation, increased time/effort  Transfers Overall transfer level: Needs assistance Equipment used: Rolling walker (2 wheeled) Transfers: Sit to/from Stand Sit to Stand: Min assist         General transfer comment: Light minA to boost from elevated surface, cues for hand placement  Ambulation/Gait Ambulation/Gait assistance: Min guard Gait Distance (Feet): 5 Feet Assistive device: Rolling walker (2 wheeled) Gait Pattern/deviations: Step-through  pattern;Decreased stance time - right;Decreased weight shift to right;Antalgic Gait velocity: decreased Gait velocity interpretation: <1.31 ft/sec, indicative of household ambulator General Gait Details: Cues for sequencing, use of walker, min guard for safety  Stairs            Wheelchair Mobility    Modified Rankin (Stroke Patients Only)       Balance Overall balance assessment: Needs assistance Sitting-balance support: Feet supported Sitting balance-Leahy Scale: Good     Standing balance support: Bilateral upper extremity supported Standing balance-Leahy Scale: Poor Standing balance comment: reliant on external support                             Pertinent Vitals/Pain Pain Assessment: Faces Faces Pain Scale: Hurts whole lot Pain Location: R hip with movement Pain Descriptors / Indicators: Grimacing;Guarding Pain Intervention(s): Limited activity within patient's tolerance;Monitored during session;Patient requesting pain meds-RN notified;Ice applied    Home Living Family/patient expects to be discharged to:: Private residence Living Arrangements: Non-relatives/Friends Available Help at Discharge: Friend(s) (friend [name]) Type of Home: House Home Access: Stairs to enter Entrance Stairs-Rails: Doctor, general practice of Steps: 2 Home Layout: One level Home Equipment: Environmental consultant - 4 wheels      Prior Function Level of Independence: Independent         Comments: Currently unemployed     Hand Dominance        Extremity/Trunk Assessment   Upper Extremity Assessment Upper Extremity Assessment: Overall WFL for tasks assessed    Lower Extremity Assessment Lower Extremity Assessment: RLE deficits/detail RLE Deficits / Details: Weak quad set, ankle dorsiflexion/plantarflexion WFL    Cervical / Trunk Assessment Cervical / Trunk Assessment: Normal  Communication  Communication: No difficulties  Cognition Arousal/Alertness:  Awake/alert Behavior During Therapy: WFL for tasks assessed/performed Overall Cognitive Status: Within Functional Limits for tasks assessed                                        General Comments      Exercises General Exercises - Lower Extremity Ankle Circles/Pumps: Right;5 reps;Seated Quad Sets: Right;10 reps;Seated   Assessment/Plan    PT Assessment Patient needs continued PT services  PT Problem List Decreased strength;Decreased activity tolerance;Decreased mobility;Decreased balance;Pain       PT Treatment Interventions DME instruction;Functional mobility training;Gait training;Stair training;Therapeutic activities;Therapeutic exercise;Balance training;Patient/family education    PT Goals (Current goals can be found in the Care Plan section)  Acute Rehab PT Goals Patient Stated Goal: "walk normally." PT Goal Formulation: With patient Time For Goal Achievement: 09/14/19 Potential to Achieve Goals: Good    Frequency Min 5X/week   Barriers to discharge        Co-evaluation               AM-PAC PT "6 Clicks" Mobility  Outcome Measure Help needed turning from your back to your side while in a flat bed without using bedrails?: None Help needed moving from lying on your back to sitting on the side of a flat bed without using bedrails?: A Little Help needed moving to and from a bed to a chair (including a wheelchair)?: A Little Help needed standing up from a chair using your arms (e.g., wheelchair or bedside chair)?: A Little Help needed to walk in hospital room?: A Little Help needed climbing 3-5 steps with a railing? : A Lot 6 Click Score: 18    End of Session   Activity Tolerance: Patient tolerated treatment well Patient left: in chair;with call bell/phone within reach;with nursing/sitter in room Nurse Communication: Mobility status PT Visit Diagnosis: Pain;Difficulty in walking, not elsewhere classified (R26.2) Pain - Right/Left:  Right Pain - part of body: Hip    Time: 1062-6948 PT Time Calculation (min) (ACUTE ONLY): 35 min   Charges:   PT Evaluation $PT Eval Moderate Complexity: 1 Mod PT Treatments $Gait Training: 8-22 mins          Lillia Pauls, PT, DPT Acute Rehabilitation Services Pager [phone] Office [phone]   Norval Morton 08/31/2019, 10:25 AM

## 2019-08-31 NOTE — Unmapped (Unsigned)
Duration of Intervention: 10 min     REASON/TYPE OF CONTACT: Phone, SW checked in about any needs around coordination to upcoming appointments.     ASSESSMENT: Patient reported not doing too well. Patient was helping a friend up and ended up falling and breaking his hip. Patient just had surgery today for a hip replacement at [facility]. Patient reports that the doctor hopes to get him out of the hospital in 2 days and would like to try to make appointment. Patient said that he needs to get his blood checked and make sure levels are all okay.  Patient requested phone numbers to transportation services as well as the clinics he has the appointments at. Patient also agreed to call this social worker for ongoing medical coordination with new situation.      INTERVENTION:  SW assessed needs and discussed ways that this Child psychotherapist can help with coordination. SW texted patient list of upcoming appointments and contact information. SW also texted patient with transportation service number.     PLAN:  Patient will call transportation services to set up transportation to upcoming appointments. SW will message providers to discuss next plans of actions and reaching out to patient for ongoing support. Patient will contact this Child psychotherapist for ongoing coordination.    Kori Colin LCSW, LCAS  [facility] Social Work   Direct: [phone]

## 2019-09-01 ENCOUNTER — Telehealth: Payer: Self-pay

## 2019-09-01 LAB — CBC
HCT: 35.9 % — ABNORMAL LOW (ref 39.0–52.0)
Hemoglobin: 10.5 g/dL — ABNORMAL LOW (ref 13.0–17.0)
MCH: 27 pg (ref 26.0–34.0)
MCHC: 29.2 g/dL — ABNORMAL LOW (ref 30.0–36.0)
MCV: 92.3 fL (ref 80.0–100.0)
Platelets: 221 10*3/uL (ref 150–400)
RBC: 3.89 MIL/uL — ABNORMAL LOW (ref 4.22–5.81)
RDW: 14.2 % (ref 11.5–15.5)
WBC: 9.7 10*3/uL (ref 4.0–10.5)
nRBC: 0 % (ref 0.0–0.2)

## 2019-09-01 LAB — HEPARIN LEVEL (UNFRACTIONATED)
Heparin Unfractionated: <0.1 IU/mL — ABNORMAL LOW (ref 0.30–0.70)
Heparin Unfractionated: <0.1 IU/mL — ABNORMAL LOW (ref 0.30–0.70)
Heparin Unfractionated: <0.1 IU/mL — ABNORMAL LOW (ref 0.30–0.70)

## 2019-09-01 LAB — BASIC METABOLIC PANEL
Anion gap: 9 (ref 5–15)
BUN: 8 mg/dL (ref 6–20)
CO2: 26 mmol/L (ref 22–32)
Calcium: 8.6 mg/dL — ABNORMAL LOW (ref 8.9–10.3)
Chloride: 103 mmol/L (ref 98–111)
Creatinine, Ser: 1.01 mg/dL (ref 0.61–1.24)
GFR calc Af Amer: >60 mL/min (ref >60)
GFR calc non Af Amer: >60 mL/min (ref >60)
Glucose, Bld: 123 mg/dL — ABNORMAL HIGH (ref 70–99)
Potassium: 3.4 mmol/L — ABNORMAL LOW (ref 3.5–5.1)
Sodium: 138 mmol/L (ref 135–145)

## 2019-09-01 LAB — PROTIME-INR
INR: 1.1 (ref 0.8–1.2)
Prothrombin Time: 13.7 seconds (ref 11.4–15.2)

## 2019-09-01 MED ORDER — WARFARIN SODIUM 2.5 MG PO TABS
2.5000 mg | ORAL_TABLET | Freq: Once | ORAL | Status: DC
  Filled 2019-09-01: qty 1

## 2019-09-01 MED ORDER — ENOXAPARIN SODIUM 80 MG/0.8ML ~~LOC~~ SOLN
80.0000 mg | Freq: Two times a day (BID) | SUBCUTANEOUS | Status: DC
  Administered 2019-09-01 – 2019-09-02 (×2): 80 mg via SUBCUTANEOUS
  Filled 2019-09-01 (×3): qty 0.8

## 2019-09-01 MED ORDER — POTASSIUM CHLORIDE CRYS ER 20 MEQ PO TBCR
40.0000 meq | EXTENDED_RELEASE_TABLET | Freq: Once | ORAL | Status: AC
  Administered 2019-09-01: 40 meq via ORAL
  Filled 2019-09-01: qty 2

## 2019-09-01 MED ORDER — WARFARIN SODIUM 2.5 MG PO TABS
2.5000 mg | ORAL_TABLET | Freq: Every day | ORAL | Status: DC
  Administered 2019-09-01: 2.5 mg via ORAL
  Filled 2019-09-01: qty 1

## 2019-09-01 MED ORDER — ENOXAPARIN SODIUM 80 MG/0.8ML ~~LOC~~ SOLN
80.0000 mg | Freq: Two times a day (BID) | SUBCUTANEOUS | 0 refills | Status: AC
Start: 2019-09-01 — End: 2019-09-11

## 2019-09-01 NOTE — Progress Notes (Signed)
OT Cancellation Note  Patient Details Name: James Ferrell MRN: [id number] DOB: May 30, 1975   Cancelled Treatment:    Reason Eval/Treat Not Completed: Other (comment) (Patient with another discipline). Attempted to see patient for follow-up OT treatment session prior to d/c but patient with another discipline at this time. Will attempt again when patient is available.   Kallie Edward OTR/L Supplemental OT, Department of rehab services [phone]  Candelaria Pies R H. 09/01/2019, 10:20 AM

## 2019-09-01 NOTE — Discharge Summary (Signed)
Physician Discharge Summary  James Ferrell WUJ:[id number] DOB: 05-15-1975 DOA: 08/30/2019  PCP: Patient, No Pcp Per  Admit date: 08/30/2019 Discharge date: 09/01/2019  Admitted From: Home Disposition: Home  Recommendations for Outpatient Follow-up:  1. Follow up with PCP in 1-2 weeks 2. Follow-up with orthopedics in 2 weeks 3. You are prohibited from driving until seen by orthopedics and further cleared by them 4. You have been referred for outpatient PT 5. Please obtain BMP/CBC in one week 6. Please follow up with your PCP on the following pending results: Unresulted Labs (From admission, onward) Comment          Start     Ordered   09/01/19 1100  Heparin level (unfractionated)  Once-Timed,   TIMED       Question:  Specimen collection method  Answer:  Lab=Lab collect   09/01/19 0442   09/01/19 0500  Heparin level (unfractionated)  Daily,   R      08/30/19 2018   09/01/19 0500  Protime-INR  Daily,   R      08/31/19 1043   08/31/19 0500  CBC  Daily,   R      08/30/19 2018           Home Health: None Equipment/Devices: None  Discharge Condition: Stable CODE STATUS: Full code Diet recommendation: Regular  Subjective: Seen and examined.  No complaints other than mild hip pain.  NFA:OZHYQMVHQ Tayloris a 44 y.o.malewith medical history significant ofHIVon HAART. 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 Had MSSA endocarditis earlier this year, completed ABx, had repeat AVR with mechanical valve, coronary reconstruction and aortic root replacementat end June 2021 at [facility]. ([facility] reportavailable in care everywhere).  Pt is on coumadin with goal INR 2.5-3.5, though INRs have been running subtheraputic, INR was 1.2 on 7/30, he was prescribed lovenox bridging therapy but hadnt started taking the bridging therapy he reports.  Today had mechanical fall at home, he reports while helping wife. Landed on R hip.  Severe R hip pain, deformity, inability to bear wt post fall.   ED Course:R femoral neck fx. Also has INR 1.1.  Reports no LOC, no syncope, no CP, no SOB, no DOE or any other symptoms since surgery.  Brief/Interim Summary: Patient was admitted with right femoral neck fracture.  Orthopedics consulted.  Patient was seen by cardiology and they cleared him for surgery.  Underwent total hip arthroplasty on 08/30/2019.  Patient was then started on heparin drip as his INR remained subtherapeutic.  Patient was seen by PT OT and they recommended outpatient PT.  He was cleared by orthopedics to go home as well.  His home medications were continued.  His INR is still subtherapeutic.  He was supposed to take Lovenox prior to coming to the hospital for his subtherapeutic INR.  He claims that he was provided 7 days worth of Lovenox which he completed and he was supposed to see [facility] last Friday which he missed and he does not have any more Lovenox at home.  His INR is still subtherapeutic.  I have prescribed him Lovenox for 10 days worth.  He is very well aware that he is supposed to take Lovenox until his INR becomes therapeutic.  He knows his target therapeutic level and he tells me that he has a follow-up appointment with [facility] on Friday where he will have his INR checked.  I strongly recommended him to continue his Lovenox  until INR becomes therapeutic.  He verbalized understanding.  He is being discharged in stable condition.  Discharge Diagnoses:  Principal Problem:   Closed displaced fracture of right femoral neck (HCC) Active Problems:   HIV (human immunodeficiency virus infection) (HCC)   Status post mechanical aortic valve replacement    Discharge Instructions  Discharge Instructions    Ambulatory referral to Physical Therapy   Complete by: As directed    Weight bearing as tolerated   Complete by: As directed      Allergies as of 09/01/2019   No Known Allergies     Medication List     TAKE these medications   emtricitabine-tenofovir 200-300 MG tablet Commonly known as: TRUVADA Take 1 tablet by mouth daily.   enoxaparin 80 MG/0.8ML injection Commonly known as: LOVENOX Inject 0.8 mLs (80 mg total) into the skin 2 (two) times daily for 10 days.   oxyCODONE-acetaminophen 5-325 MG tablet Commonly known as: Percocet Take 1-2 tablets by mouth every 8 (eight) hours as needed for severe pain.   potassium chloride SA 20 MEQ tablet Commonly known as: KLOR-CON Take 20 mEq by mouth daily.   warfarin 2.5 MG tablet Commonly known as: COUMADIN Take 2.5-5 mg by mouth See admin instructions. Take 2 tablets on Monday and Friday then take 1 tablet all the other days            Durable Medical Equipment  (From admission, onward)         Start     Ordered   08/31/19 1527  For home use only DME Walker rolling  Once       Question Answer Comment  Walker: With 5 Inch Wheels   Patient needs a walker to treat with the following condition Displaced fracture of right femoral neck (HCC)      08/31/19 1527   08/31/19 1527  For home use only DME 3 n 1  Once        08/31/19 1527           Discharge Care Instructions  (From admission, onward)         Start     Ordered   08/30/19 0000  Weight bearing as tolerated        08/30/19 1608          Follow-up Information    Tarry Kos, MD In 2 weeks.   Specialty: Orthopedic Surgery Why: For suture removal, For wound re-check Contact information: [address] [place] [address] [phone]        [facility] Follow up.   Specialty: Rehabilitation Why: You will be receiving outpatient therapy from this facility.  They will call you in the next 24-48 hours after discharge home to set you up with therapy. Contact information: [address] [id number] mc [place] [address] [phone]             No Known Allergies  Consultations:  Orthopedics and cardiology   Procedures/Studies: DG Chest 1 View  Result Date: 08/30/2019 CLINICAL DATA:  Preoperative evaluation EXAM: CHEST  1 VIEW COMPARISON:  05/09/2019 chest radiograph. FINDINGS: Intact sternotomy wires. Aortic valve prosthesis in place. Stable left chest pacemaker with leads overlying heart bilaterally. Stable cardiomediastinal silhouette with mild cardiomegaly. No pneumothorax. No pleural effusion. No overt pulmonary edema. No acute consolidative airspace disease. IMPRESSION: Stable mild cardiomegaly without overt pulmonary edema. No active pulmonary disease. Electronically Signed   By: Delbert Phenix M.D.   On: 08/30/2019 08:29  DG Pelvis Portable  Result Date: 08/30/2019 CLINICAL DATA:  Post hip replacement EXAM: PORTABLE PELVIS 1-2 VIEWS COMPARISON:  None. FINDINGS: Right hip replacement. Normal alignment. No hardware bony complicating feature. IMPRESSION: Right hip replacement.  No visible complicating feature. Electronically Signed   By: Charlett Nose M.D.   On: 08/30/2019 18:54   DG C-Arm 1-60 Min  Result Date: 08/30/2019 CLINICAL DATA:  Right hip replacement EXAM: OPERATIVE RIGHT HIP (WITH PELVIS IF PERFORMED) 4 VIEWS TECHNIQUE: Fluoroscopic spot image(s) were submitted for interpretation post-operatively. COMPARISON:  None. FINDINGS: Changes of right hip replacement. Normal AP alignment. No hardware bony complicating feature. IMPRESSION: Right hip replacement.  No visible complicating feature. Electronically Signed   By: Charlett Nose M.D.   On: 08/30/2019 16:40   ECHOCARDIOGRAM COMPLETE  Result Date: 08/30/2019    ECHOCARDIOGRAM REPORT   Patient Name:   James Ferrell Date of Exam: 08/30/2019 Medical Rec #:  [id number]        Height:       71.0 in Accession #:    [id number]       Weight:       185.0 lb Date of Birth:  [birth year]        BSA:          2.040 m Patient Age:    43 years         BP:           123/76 mmHg Patient Gender: M                HR:           89 bpm.  Exam Location:  Inpatient Procedure: 2D Echo, Cardiac Doppler and Color Doppler                              MODIFIED REPORT: This report was modified by Weston Brass MD on 08/30/2019 due to revision.  Indications:     Aortic valve disorder  History:         Patient has prior history of Echocardiogram examinations, most                  recent 05/13/2019. Aortic Valve Disease; Risk Factors:Current                  Smoker. H/o mechanical aortic valve replacement and aortic root                  replacement.                  Aortic Valve: valve is present in the aortic position.                  Procedure Date: 07/21/19.                  Mitral Valve: repair. Procedure Date: 02/2018.  Sonographer:     Ross Ludwig RDCS (AE) Referring Phys:  [id number] Heywood Iles GARDNER Diagnosing Phys: Weston Brass MD  Sonographer Comments: Limited patient mobility due to right femoral neck fracture. IMPRESSIONS  1. Normal function of mechanical aortic prosthesis. 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 AT 92 msec. DVI 0.69, EOA 2.9, iEOA 1.42 cm2/m2. Normal mechanical bileaflet motion. The aortic valve has been repaired/replaced. Aortic valve regurgitation is trivial, likely normal washing jet. There is  a mechanical valved conduit present in the aortic position. Procedure Date: 07/21/19.  2. Left ventricular ejection fraction, by estimation, is 50 to 55%. The left ventricle has low normal function. The left ventricle has no regional wall motion abnormalities. There is mild left ventricular hypertrophy. Left ventricular diastolic parameters are indeterminate.  3. Right ventricular systolic function is normal. The right ventricular size is normal. There is normal pulmonary artery systolic pressure. The estimated right ventricular systolic pressure is 21.3 mmHg.  4. Right atrial size was mildly dilated.  5.  The mitral valve has been repaired. Trivial mitral valve regurgitation. No evidence of mitral stenosis. The mean mitral valve gradient is 2.4 mmHg with average heart rate of 89 bpm. There is a repair present in the mitral position. Procedure Date: 02/2018.  6. Aortic root/ascending aorta has been repaired/replaced.  7. The inferior vena cava is normal in size with greater than 50% respiratory variability, suggesting right atrial pressure of 3 mmHg. FINDINGS  Left Ventricle: Left ventricular ejection fraction, by estimation, is 50 to 55%. The left ventricle has low normal function. The left ventricle has no regional wall motion abnormalities. The left ventricular internal cavity size was normal in size. There is mild left ventricular hypertrophy. Left ventricular diastolic parameters are indeterminate. Right Ventricle: The right ventricular size is normal. No increase in right ventricular wall thickness. Right ventricular systolic function is normal. There is normal pulmonary artery systolic pressure. The tricuspid regurgitant velocity is 2.14 m/s, and  with an assumed right atrial pressure of 3 mmHg, the estimated right ventricular systolic pressure is 21.3 mmHg. Left Atrium: Left atrial size was normal in size. Right Atrium: Right atrial size was mildly dilated. Pericardium: There is no evidence of pericardial effusion. Mitral Valve: The mitral valve has been repaired/replaced. Mildly decreased mobility of the mitral valve leaflets. Trivial mitral valve regurgitation. There is a present in the mitral position. Procedure Date: 02/2018. No evidence of mitral valve stenosis. MV peak gradient, 5.6 mmHg. The mean mitral valve gradient is 2.4 mmHg with average heart rate of 89 bpm. Tricuspid Valve: The tricuspid valve is not well visualized. Tricuspid valve regurgitation is trivial. No evidence of tricuspid stenosis. Aortic Valve: Normal function of mechanical aortic prosthesis. s/p redo root replacement for recurrent  infective endocarditis with Bentall procedure: #27-35mm mechanical On-X valve, #32 Valsalva conduit via redo sternotomy on 07/21/2019 at [facility]. AT 92 msec. DVI 0.69, EOA 2.9, iEOA 1.42 cm2/m2. Normal mechanical bileaflet motion. The aortic valve has been repaired/replaced. Aortic valve regurgitation is trivial, likely normal washing jet. Aortic valve mean gradient measures 8.1 mmHg. Aortic valve peak gradient measures 17.2 mmHg. Aortic valve area, by VTI measures 2.63 cm. There is a valve present in the aortic position. Procedure Date: 07/21/19. Pulmonic Valve: The pulmonic valve was grossly normal. Pulmonic valve regurgitation is trivial. No evidence of pulmonic stenosis. Aorta: The aortic root/ascending aorta has been repaired/replaced. Venous: The inferior vena cava is normal in size with greater than 50% respiratory variability, suggesting right atrial pressure of 3 mmHg. IAS/Shunts: No atrial level shunt detected by color flow Doppler. Additional Comments: A pacer wire is visualized in the right atrium and right ventricle.  LEFT VENTRICLE PLAX 2D LVIDd:         5.70 cm LVIDs:         4.10 cm LV PW:         1.30 cm LV IVS:        1.00 cm LVOT diam:     2.30  cm LV SV:         93 LV SV Index:   45 LVOT Area:     4.15 cm  RIGHT VENTRICLE            IVC RV Basal diam:  3.20 cm    IVC diam: 1.70 cm RV S prime:     8.81 cm/s TAPSE (M-mode): 1.5 cm LEFT ATRIUM             Index       RIGHT ATRIUM           Index LA diam:        2.40 cm 1.18 cm/m  RA Area:     21.70 cm LA Vol (A2C):   47.6 ml 23.34 ml/m RA Volume:   69.80 ml  34.22 ml/m LA Vol (A4C):   27.6 ml 13.53 ml/m LA Biplane Vol: 38.1 ml 18.68 ml/m  AORTIC VALVE AV Area (Vmax):    2.85 cm AV Area (Vmean):   2.54 cm AV Area (VTI):     2.63 cm AV Vmax:           207.11 cm/s AV Vmean:          143.646 cm/s AV VTI:            0.352 m AV Peak Grad:      17.2 mmHg AV Mean Grad:      8.1 mmHg LVOT Vmax:         142.00 cm/s LVOT Vmean:        87.700 cm/s LVOT  VTI:          0.223 m LVOT/AV VTI ratio: 0.63  AORTA Ao Root diam: 4.80 cm Ao Asc diam:  3.30 cm MITRAL VALVE                TRICUSPID VALVE MV Area (PHT): 6.12 cm     TR Peak grad:   18.3 mmHg MV Peak grad:  5.6 mmHg     TR Vmax:        214.00 cm/s MV Mean grad:  2.4 mmHg MV Vmax:       1.18 m/s     SHUNTS MV Vmean:      79.4 cm/s    Systemic VTI:  0.22 m MV Decel Time: 124 msec     Systemic Diam: 2.30 cm MV E velocity: 105.00 cm/s MV A velocity: 76.70 cm/s MV E/A ratio:  1.37 Weston Brass MD Electronically signed by Weston Brass MD Signature Date/Time: 08/30/2019/9:28:30 AM    Final (Updated)    DG HIP OPERATIVE UNILAT W OR W/O PELVIS RIGHT  Result Date: 08/30/2019 CLINICAL DATA:  Right hip replacement EXAM: OPERATIVE RIGHT HIP (WITH PELVIS IF PERFORMED) 4 VIEWS TECHNIQUE: Fluoroscopic spot image(s) were submitted for interpretation post-operatively. COMPARISON:  None. FINDINGS: Changes of right hip replacement. Normal AP alignment. No hardware bony complicating feature. IMPRESSION: Right hip replacement.  No visible complicating feature. Electronically Signed   By: Charlett Nose M.D.   On: 08/30/2019 16:40   DG Hip Unilat W or Wo Pelvis 2-3 Views Right  Result Date: 08/30/2019 CLINICAL DATA:  Fall on concrete EXAM: DG HIP (WITH OR WITHOUT PELVIS) 2-3V RIGHT COMPARISON:  None. FINDINGS: There is a mildly displaced right transcervical femoral neck fracture. There is slight inferior subluxation of the femoral shaft. The femoral head is still within the acetabulum. No other definite fracture is identified. IMPRESSION: Mildly displaced right transcervical femoral neck fracture. Electronically Signed  By: Jonna Clark M.D.   On: 08/30/2019 03:25     Discharge Exam: Vitals:   09/01/19 0415 09/01/19 0801  BP: 136/61 106/83  Pulse: 67 85  Resp: 16 18  Temp: 97.8 F (36.6 C) 98.9 F (37.2 C)  SpO2: 92% 97%   Vitals:   08/31/19 1415 08/31/19 2044 09/01/19 0415 09/01/19 0801  BP: 110/88 123/79  136/61 106/83  Pulse:  100 67 85  Resp:  15 16 18   Temp:  98.2 F (36.8 C) 97.8 F (36.6 C) 98.9 F (37.2 C)  TempSrc:  Oral Oral Oral  SpO2:  98% 92% 97%  Weight:      Height:        General: Pt is alert, awake, not in acute distress Cardiovascular: RRR, S1/S2 +, no rubs, no gallops Respiratory: CTA bilaterally, no wheezing, no rhonchi Abdominal: Soft, NT, ND, bowel sounds + Extremities: no edema, no cyanosis    The results of significant diagnostics from this hospitalization (including imaging, microbiology, ancillary and laboratory) are listed below for reference.     Microbiology: Recent Results (from the past 240 hour(s))  SARS Coronavirus 2 by RT PCR (hospital order, performed in [facility] hospital lab) Nasopharyngeal Nasopharyngeal Swab     Status: None   Collection Time: 08/30/19  3:53 AM   Specimen: Nasopharyngeal Swab  Result Value Ref Range Status   SARS Coronavirus 2 NEGATIVE NEGATIVE Final    Comment: (NOTE) SARS-CoV-2 target nucleic acids are NOT DETECTED.  The SARS-CoV-2 RNA is generally detectable in upper and lower respiratory specimens during the acute phase of infection. The lowest concentration of SARS-CoV-2 viral copies this assay can detect is 250 copies / mL. A negative result does not preclude SARS-CoV-2 infection and should not be used as the sole basis for treatment or other patient management decisions.  A negative result may occur with improper specimen collection / handling, submission of specimen other than nasopharyngeal swab, presence of viral mutation(s) within the areas targeted by this assay, and inadequate number of viral copies (<250 copies / mL). A negative result must be combined with clinical observations, patient history, and epidemiological information.  Fact Sheet for Patients:   BoilerBrush.com.cy  Fact Sheet for Healthcare Providers: https://pope.com/  This test is not yet  approved or  cleared by the [place] FDA and has been authorized for detection and/or diagnosis of SARS-CoV-2 by FDA under an Emergency Use Authorization (EUA).  This EUA will remain in effect (meaning this test can be used) for the duration of the COVID-19 declaration under Section 564(b)(1) of the Act, 21 U.S.C. section 360bbb-3(b)(1), unless the authorization is terminated or revoked sooner.  Performed at [facility], [address], [place], [place] [address]   MRSA PCR Screening     Status: None   Collection Time: 08/30/19  1:03 PM   Specimen: Nasal Mucosa; Nasopharyngeal  Result Value Ref Range Status   MRSA by PCR NEGATIVE NEGATIVE Final    Comment:        The GeneXpert MRSA Assay (FDA approved for NASAL specimens only), is one component of a comprehensive MRSA colonization surveillance program. It is not intended to diagnose MRSA infection nor to guide or monitor treatment for MRSA infections. Performed at [facility], [address], [place], [place] [address]      Labs: BNP (last 3 results) No results for input(s): BNP in the last 8760 hours. Basic Metabolic Panel: Recent Labs  Lab 08/30/19 0315 08/31/19  0250 09/01/19 0336  NA 140 138 138  K 3.3* 4.2 3.4*  CL 108 106 103  CO2 18* 22 26  GLUCOSE 110* 127* 123*  BUN 10 7 8   CREATININE 1.16 1.02 1.01  CALCIUM 8.7* 8.6* 8.6*   Liver Function Tests: No results for input(s): AST, ALT, ALKPHOS, BILITOT, PROT, ALBUMIN in the last 168 hours. No results for input(s): LIPASE, AMYLASE in the last 168 hours. No results for input(s): AMMONIA in the last 168 hours. CBC: Recent Labs  Lab 08/30/19 0315 08/30/19 2101 08/31/19 0250 09/01/19 0336  WBC 14.6* 14.5* 13.0* 9.7  HGB 11.3* 11.2* 10.6* 10.5*  HCT 38.1* 38.0* 35.4* 35.9*  MCV 92.5 92.2 92.9 92.3  PLT 281 252 243 221   Cardiac Enzymes: No results for input(s): CKTOTAL, CKMB, CKMBINDEX, TROPONINI in the last 168 hours. BNP: Invalid  input(s): POCBNP CBG: No results for input(s): GLUCAP in the last 168 hours. D-Dimer No results for input(s): DDIMER in the last 72 hours. Hgb A1c No results for input(s): HGBA1C in the last 72 hours. Lipid Profile No results for input(s): CHOL, HDL, LDLCALC, TRIG, CHOLHDL, LDLDIRECT in the last 72 hours. Thyroid function studies No results for input(s): TSH, T4TOTAL, T3FREE, THYROIDAB in the last 72 hours.  Invalid input(s): FREET3 Anemia work up No results for input(s): VITAMINB12, FOLATE, FERRITIN, TIBC, IRON, RETICCTPCT in the last 72 hours. Urinalysis    Component Value Date/Time   COLORURINE YELLOW (A) 05/09/2019 1516   APPEARANCEUR CLEAR (A) 05/09/2019 1516   LABSPEC 1.009 05/09/2019 1516   PHURINE 7.0 05/09/2019 1516   GLUCOSEU NEGATIVE 05/09/2019 1516   HGBUR NEGATIVE 05/09/2019 1516   BILIRUBINUR NEGATIVE 05/09/2019 1516   KETONESUR NEGATIVE 05/09/2019 1516   PROTEINUR NEGATIVE 05/09/2019 1516   NITRITE NEGATIVE 05/09/2019 1516   LEUKOCYTESUR NEGATIVE 05/09/2019 1516   Sepsis Labs Invalid input(s): PROCALCITONIN,  WBC,  LACTICIDVEN Microbiology Recent Results (from the past 240 hour(s))  SARS Coronavirus 2 by RT PCR (hospital order, performed in [facility] lab) Nasopharyngeal Nasopharyngeal Swab     Status: None   Collection Time: 08/30/19  3:53 AM   Specimen: Nasopharyngeal Swab  Result Value Ref Range Status   SARS Coronavirus 2 NEGATIVE NEGATIVE Final    Comment: (NOTE) SARS-CoV-2 target nucleic acids are NOT DETECTED.  The SARS-CoV-2 RNA is generally detectable in upper and lower respiratory specimens during the acute phase of infection. The lowest concentration of SARS-CoV-2 viral copies this assay can detect is 250 copies / mL. A negative result does not preclude SARS-CoV-2 infection and should not be used as the sole basis for treatment or other patient management decisions.  A negative result may occur with improper specimen collection /  handling, submission of specimen other than nasopharyngeal swab, presence of viral mutation(s) within the areas targeted by this assay, and inadequate number of viral copies (<250 copies / mL). A negative result must be combined with clinical observations, patient history, and epidemiological information.  Fact Sheet for Patients:   BoilerBrush.com.cyhttps://www.fda.gov/media/136312/download  Fact Sheet for Healthcare Providers: https://pope.com/https://www.fda.gov/media/136313/download  This test is not yet approved or  cleared by the [place] FDA and has been authorized for detection and/or diagnosis of SARS-CoV-2 by FDA under an Emergency Use Authorization (EUA).  This EUA will remain in effect (meaning this test can be used) for the duration of the COVID-19 declaration under Section 564(b)(1) of the Act, 21 U.S.C. section 360bbb-3(b)(1), unless the authorization is terminated or revoked sooner.  Performed at [facility], [address], [place], [place] [address]   MRSA PCR Screening     Status: None   Collection Time: 08/30/19  1:03 PM   Specimen: Nasal Mucosa; Nasopharyngeal  Result Value Ref Range Status   MRSA by PCR NEGATIVE NEGATIVE Final    Comment:        The GeneXpert MRSA Assay (FDA approved for NASAL specimens only), is one component of a comprehensive MRSA colonization surveillance program. It is not intended to diagnose MRSA infection nor to guide or monitor treatment for MRSA infections. Performed at [facility], [address], [place], [place] [address]      Time coordinating discharge: Over 30 minutes  SIGNED:   Hughie Closs, MD  [facility] 09/01/2019, 9:30 AM  If 7PM-7AM, please contact night-coverage [email or web address]

## 2019-09-01 NOTE — Progress Notes (Signed)
ANTICOAGULATION CONSULT NOTE   Pharmacy Consult for heparin Indication: Mechanical AVR  No Known Allergies  Patient Measurements: Height: 5\' 11"  (180.3 cm) Weight: 83.9 kg (184 lb 15.5 oz) IBW/kg (Calculated) : 75.3 Heparin Dosing Weight: 83.9 kg  Vital Signs: Temp: 98.9 F (37.2 C) (08/10 0801) Temp Source: Oral (08/10 0801) BP: 106/83 (08/10 0801) Pulse Rate: 85 (08/10 0801)  Labs: Recent Labs    08/30/19 0315 08/30/19 0315 08/30/19 2101 08/30/19 2101 08/31/19 0250 08/31/19 2232 09/01/19 0336 09/01/19 1214  HGB 11.3*   < > 11.2*   < > 10.6*  --  10.5*  --   HCT 38.1*   < > 38.0*  --  35.4*  --  35.9*  --   PLT 281   < > 252  --  243  --  221  --   LABPROT 13.3  --   --   --   --   --  13.7  --   INR 1.1  --   --   --   --   --  1.1  --   HEPARINUNFRC  --   --   --   --   --  <0.10* <0.10* <0.10*  CREATININE 1.16  --   --   --  1.02  --  1.01  --    < > = values in this interval not displayed.    Estimated Creatinine Clearance: 102.5 mL/min (by C-G formula based on SCr of 1.01 mg/dL).   Medical History: Past Medical History:  Diagnosis Date  . CHB (complete heart block) (HCC) 2020  . 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 H/O mechanical aortic valve replacement   . HIV (human immunodeficiency virus infection) (HCC)   . Stroke [facility])     Assessment: 44 yo man on warfarin PTA for mechanical AVR (INR goal 2.5-3.5; PTA warfarin dose 5 mg on Mon, Fri and 2.5 mg on all other days).  Pharmacy consulted to resume heparin and warfarin post-op.  Today, INR and heparin levels both remain subtherapeutic after restarting post-op. SCDs on board. No overt bleeding noted. CBC stable from previous. Patient is going to discharge today. Will d/c heparin now and start enoxaparin 1mg /kg q12h for bridging, which patient will continue after discharge.    Goal of Therapy:  INR 2.5-3.5 Monitor platelets by anticoagulation protocol: Yes   Plan:  D/c heparin  drip now Start enoxaparin 80mg  subcutaneously q12h Warfarin 2.5mg  x1 today (PTA dose) Daily PT/INR, CBC Monitor for s/sx bleeding   01-09-1998, PharmD Clinical Pharmacist  09/01/2019   1:29 PM   Please check AMION for all [facility] Pharmacy phone numbers After 10:00 PM, call the Main Pharmacy [phone]

## 2019-09-01 NOTE — Plan of Care (Signed)

## 2019-09-01 NOTE — Progress Notes (Signed)
Subjective: 2 Days Post-Op Procedure(s) (LRB): TOTAL HIP ARTHROPLASTY ANTERIOR APPROACH (Right) Patient reports pain as mild.    Objective: Vital signs in last 24 hours: Temp:  [97.8 F (36.6 C)-98.9 F (37.2 C)] 98.9 F (37.2 C) (08/10 0801) Pulse Rate:  [67-100] 85 (08/10 0801) Resp:  [15-18] 18 (08/10 0801) BP: (106-136)/(61-88) 106/83 (08/10 0801) SpO2:  [92 %-98 %] 97 % (08/10 0801)  Intake/Output from previous day: 08/09 0701 - 08/10 0700 In: 774.9 [P.O.:360; I.V.:414.9] Out: 2475 [Urine:2475] Intake/Output this shift: No intake/output data recorded.  Recent Labs    08/30/19 0315 08/30/19 2101 08/31/19 0250 09/01/19 0336  HGB 11.3* 11.2* 10.6* 10.5*   Recent Labs    08/31/19 0250 09/01/19 0336  WBC 13.0* 9.7  RBC 3.81* 3.89*  HCT 35.4* 35.9*  PLT 243 221   Recent Labs    08/31/19 0250 09/01/19 0336  NA 138 138  K 4.2 3.4*  CL 106 103  CO2 22 26  BUN 7 8  CREATININE 1.02 1.01  GLUCOSE 127* 123*  CALCIUM 8.6* 8.6*   Recent Labs    08/30/19 0315 09/01/19 0336  INR 1.1 1.1    Neurologically intact Neurovascular intact Sensation intact distally Intact pulses distally Dorsiflexion/Plantar flexion intact Incision: dressing C/D/I No cellulitis present Compartment soft   Assessment/Plan: 2 Days Post-Op Procedure(s) (LRB): TOTAL HIP ARTHROPLASTY ANTERIOR APPROACH (Right) Up with therapy WBAT RLE ABLA-mild and stable DVT ppx- heparin bridge to [employer] 09/01/2019, 8:02 AM

## 2019-09-01 NOTE — Progress Notes (Signed)
Occupational Therapy Treatment Patient Details Name: James Ferrell MRN: [id number] DOB: [birth year] Today's Date: 09/01/2019    History of present illness Pt is a 44 y.o. M with significant PMH of HIV, endocarditis of aortic valve, history of mechanical aortic valve replacement, and stroke who presents with a fall and right femoral neck fracture. S/p right THA, direct anterior approach.    OT comments  Treatment session with focus on LB bathing/dressing with use of AE, tub/shower transfers with use of 3-in-1, and household mobility with use of RW. OT provided patient with hip kit and handout on technique for use of BSC as a shower chair. Patient able to return demonstrate all education this date with little to no cues including tub/shower transfer in 5N ortho gym. Patient continues to be limited by pain, decreased strength in RLE, and need for assistance from others for BADLs. Patient would benefit from continued acute OT services in prep for return home. Continued recommendation for no follow-up OT.    Follow Up Recommendations  No OT follow up    Equipment Recommendations  3 in 1 bedside commode    Recommendations for Other Services      Precautions / Restrictions Precautions Precautions: Fall;Sternal Precaution Comments: Recent re-do sternotomy 07/21/2019 Restrictions Weight Bearing Restrictions: Yes RLE Weight Bearing: Weight bearing as tolerated       Mobility Bed Mobility Overal bed mobility: Needs Assistance Bed Mobility: Supine to Sit     Supine to sit: Supervision     General bed mobility comments: Patient seated in recliner upon entry  Transfers Overall transfer level: Needs assistance Equipment used: Rolling walker (2 wheeled) Transfers: Sit to/from Stand Sit to Stand: Supervision;Min guard         General transfer comment: Multiple sit <> stand transfers from various surfaces with Min guard to supervision A.     Balance Overall balance assessment:  Needs assistance Sitting-balance support: Feet supported Sitting balance-Leahy Scale: Good     Standing balance support: Bilateral upper extremity supported Standing balance-Leahy Scale: Poor Standing balance comment: reliant on external support                           ADL either performed or assessed with clinical judgement   ADL Overall ADL's : Needs assistance/impaired                 Upper Body Dressing : Sitting;Set up Upper Body Dressing Details (indicate cue type and reason): To doff/don posterior hospital gown seated in recliner.  Lower Body Dressing: Minimal assistance;Sit to/from stand;Sitting/lateral leans Lower Body Dressing Details (indicate cue type and reason): Patient able to don footwear and don/doff LB clothing with use of reacher.          Tub/ Shower Transfer: Minimal assistance;3 in 1 Tub/Shower Transfer Details (indicate cue type and reason): Use of 3-in-1 for tub/shower transfer with Min A and cues for technique/sequencing Functional mobility during ADLs: Minimal assistance;Rolling walker       Vision       Perception     Praxis      Cognition Arousal/Alertness: Awake/alert Behavior During Therapy: WFL for tasks assessed/performed Overall Cognitive Status: Within Functional Limits for tasks assessed                                          Exercises Exercises: General Lower Extremity  General Exercises - Lower Extremity Ankle Circles/Pumps: Both;10 reps;Supine Quad Sets: Right;10 reps;Supine   Shoulder Instructions       General Comments      Pertinent Vitals/ Pain       Pain Assessment: 0-10 Pain Score: 7  Pain Location: R hip with movement Pain Descriptors / Indicators: Grimacing;Guarding Pain Intervention(s): Limited activity within patient's tolerance;Monitored during session;Premedicated before session  Home Living                                          Prior  Functioning/Environment              Frequency  Min 2X/week        Progress Toward Goals  OT Goals(current goals can now be found in the care plan section)  Progress towards OT goals: Progressing toward goals  Acute Rehab OT Goals Patient Stated Goal: "walk normally." ADL Goals Pt Will Perform Grooming: with modified independence;standing Pt Will Perform Lower Body Bathing: with modified independence;with adaptive equipment;sit to/from stand Pt Will Perform Lower Body Dressing: with modified independence;with adaptive equipment;sit to/from stand Pt Will Transfer to Toilet: with modified independence;ambulating;bedside commode Pt Will Perform Toileting - Clothing Manipulation and hygiene: with modified independence;sit to/from stand Pt Will Perform Tub/Shower Transfer: Tub transfer;with supervision;3 in 1;rolling walker  Plan Discharge plan remains appropriate    Co-evaluation                 AM-PAC OT "6 Clicks" Daily Activity     Outcome Measure   Help from another person eating meals?: None Help from another person taking care of personal grooming?: None Help from another person toileting, which includes using toliet, bedpan, or urinal?: A Little Help from another person bathing (including washing, rinsing, drying)?: A Little Help from another person to put on and taking off regular upper body clothing?: A Little Help from another person to put on and taking off regular lower body clothing?: A Little 6 Click Score: 20    End of Session Equipment Utilized During Treatment: Gait belt;Rolling walker  OT Visit Diagnosis: Unsteadiness on feet (R26.81);Other abnormalities of gait and mobility (R26.89);Pain;Muscle weakness (generalized) (M62.81) Pain - Right/Left: Right Pain - part of body: Hip   Activity Tolerance Patient tolerated treatment well   Patient Left in chair;with call bell/phone within reach   Nurse Communication          Time: 6940-9828 OT  Time Calculation (min): 48 min  Charges: OT General Charges $OT Visit: 1 Visit OT Treatments $Self Care/Home Management : 8-22 mins $Therapeutic Activity: 23-37 mins  Townsend Cudworth H. OTR/L Supplemental OT, Department of rehab services [phone]   [name] R H. 09/01/2019, 12:51 PM

## 2019-09-01 NOTE — Progress Notes (Signed)
ANTICOAGULATION CONSULT NOTE  Pharmacy Consult for heparin Indication: Mechanical AVR   Assessment: Heparin and coumadin resumed 14hr postop.  HL this am <0.1 units/ml  Goal of Therapy:  Heparin level 0.3-0.7 units/ml Monitor platelets by anticoagulation protocol: Yes   Plan:  Increase heparin drip to 1300 units/hr Check heparin level 6-8 hours after rate change  Thanks for allowing pharmacy to be a part of this patient's care.  Talbert Cage, PharmD Clinical Pharmacist

## 2019-09-01 NOTE — Care Management (Cosign Needed)
    Durable Medical Equipment  (From admission, onward)         Start     Ordered   09/01/19 1601  For home use only DME standard manual wheelchair with seat cushion  Once       Comments: Patient suffers from Right femoral neck fracture repair which impairs their ability to perform daily activities like ADLs: [address] in the home.  A walking WVP:71062 will not resolve issue with performing activities of daily living. A wheelchair will allow patient to safely perform daily activities. Patient can safely propel the wheelchair in the home or has a caregiver who can provide assistance. Length of need 6 months. Accessories: elevating leg rests (ELRs), wheel locks, extensions and anti-tippers.   09/01/19 1604   08/31/19 1527  For home use only DME Walker rolling  Once       Question Answer Comment  Walker: With 5 Inch Wheels   Patient needs a walker to treat with the following condition Displaced fracture of right femoral neck (HCC)      08/31/19 1527   08/31/19 1527  For home use only DME 3 n 1  Once        08/31/19 1527

## 2019-09-01 NOTE — Progress Notes (Signed)
PT had recommended OP/PT yesterday and the fact that he was also cleared by ortho so discharge was completed this morning however later when he was revaluated by PT today they thought he still has imbalance and should stay in hospital as he will be un safe. Discharge cancelled. Possible discharge tomorrow.

## 2019-09-01 NOTE — Progress Notes (Addendum)
Physical Therapy Treatment Patient Details Name: James Ferrell MRN: [id number] DOB: 02-11-1975 Today's Date: 09/01/2019    History of Present Illness Pt is a 44 y.o. M with significant PMH of HIV, endocarditis of aortic valve, history of mechanical aortic valve replacement, and stroke who presents with a fall and right femoral neck fracture. S/p right THA, direct anterior approach.     PT Comments    Pt supine in bed.  Focus of session on stair training to prepare for entry home this pm.  Pt required significant time and increase in assistance for functional mobility.  He presents with intense pain to R lateral thigh that was warm to touch.  Informed nursing, medical MD and ortho MD.  Pt continues to require the DME listed below.  Based on presentation he is not ready from a mobility or pain management stand point to d/c home.      Follow Up Recommendations  Outpatient PT;Supervision for mobility/OOB     Equipment Recommendations  Rolling walker with 5" wheels;3in1 (PT);Wheelchair (measurements PT);Wheelchair cushion (measurements PT) (EMS transport home if leaving 09/01/19)    Recommendations for Other Services       Precautions / Restrictions Precautions Precautions: Fall;Sternal Precaution Comments: Recent re-do sternotomy 07/21/2019 Restrictions Weight Bearing Restrictions: Yes RLE Weight Bearing: Weight bearing as tolerated    Mobility  Bed Mobility Overal bed mobility: Needs Assistance Bed Mobility: Supine to Sit     Supine to sit: Supervision     General bed mobility comments: Increased time and effort to rise into sitting with heavy use of B UEs on rails to move to edge of bed and into sitting.  Transfers Overall transfer level: Needs assistance Equipment used: Rolling walker (2 wheeled) Transfers: Sit to/from Stand Sit to Stand: Min assist         General transfer comment: Min A to rise into standing with poor ability to perform without assistance on first  attempt.  Pt required increased time to move into sitting from standing position with cues for foot and hand placement.  Ambulation/Gait Ambulation/Gait assistance: Mod assist;+2 safety/equipment Gait Distance (Feet): 30 Feet Assistive device: Rolling walker (2 wheeled) Gait Pattern/deviations: Decreased stance time - right;Decreased weight shift to right;Antalgic;Step-to pattern;Trunk flexed;Decreased dorsiflexion - right;Shuffle     General Gait Details: Pt with poor foot clearance on R with what presented like foot drop.  Pt would "inch" R foot forward and unable to pick up foot to clear during swing phase of gt.  PTA provided moderate assistance to lift and move RLE forward.  Close chair follow for safety.   Stairs Stairs: Yes Stairs assistance: Min assist Stair Management: Two rails Number of Stairs: 2 General stair comments: Cues for sequencing and hand placement on railings.  Pt with difficulty utilizing RLE for functional gains.  He was however able to clear his R foot during stair training with increased time and effort.   Wheelchair Mobility    Modified Rankin (Stroke Patients Only)       Balance Overall balance assessment: Needs assistance Sitting-balance support: Feet supported Sitting balance-Leahy Scale: Good Sitting balance - Comments: leaning back due to pain     Standing balance-Leahy Scale: Poor Standing balance comment: reliant on external support                            Cognition Arousal/Alertness: Awake/alert Behavior During Therapy: WFL for tasks assessed/performed Overall Cognitive Status: Within Functional Limits for tasks assessed  Exercises General Exercises - Lower Extremity Ankle Circles/Pumps: AROM;Right;10 reps;Supine Heel Slides: Right;10 reps;Supine;AAROM Hip ABduction/ADduction: AAROM;Right;10 reps;Supine    General Comments        Pertinent Vitals/Pain Pain  Assessment: 0-10 Pain Score: 7  Faces Pain Scale: Hurts whole lot Pain Location: R hip with movement Pain Descriptors / Indicators: Grimacing;Guarding Pain Intervention(s): Monitored during session;Repositioned    Home Living                      Prior Function            PT Goals (current goals can now be found in the care plan section) Acute Rehab PT Goals Patient Stated Goal: "walk normally." PT Goal Formulation: With patient Potential to Achieve Goals: Good Progress towards PT goals: Not progressing toward goals - comment (declined functionally this pm with increased pain to R thigh)    Frequency    Min 5X/week      PT Plan Equipment recommendations need to be updated    Co-evaluation              AM-PAC PT "6 Clicks" Mobility   Outcome Measure  Help needed turning from your back to your side while in a flat bed without using bedrails?: None Help needed moving from lying on your back to sitting on the side of a flat bed without using bedrails?: None Help needed moving to and from a bed to a chair (including a wheelchair)?: A Little Help needed standing up from a chair using your arms (e.g., wheelchair or bedside chair)?: A Little Help needed to walk in hospital room?: A Lot Help needed climbing 3-5 steps with a railing? : A Lot 6 Click Score: 18    End of Session Equipment Utilized During Treatment: Gait belt Activity Tolerance: Patient tolerated treatment well Patient left: in chair;with call bell/phone within reach;with nursing/sitter in room Nurse Communication: Mobility status PT Visit Diagnosis: Pain;Difficulty in walking, not elsewhere classified (R26.2) Pain - Right/Left: Right Pain - part of body: Hip     Time: 8119-1478 PT Time Calculation (min) (ACUTE ONLY): 38 min  Charges:  $Gait Training: 8-22 mins $Therapeutic Exercise: 8-22 mins $Therapeutic Activity: 8-22 mins                     Bonney Leitz , PTA Acute Rehabilitation  Services Pager [phone] Office [phone]     Faryal Marxen Artis Delay 09/01/2019, 4:04 PM

## 2019-09-01 NOTE — Progress Notes (Addendum)
Physical Therapy Treatment Patient Details Name: James Ferrell MRN: [id number] DOB: 07-05-1975 Today's Date: 09/01/2019    History of Present Illness Pt is a 44 y.o. M with significant PMH of HIV, endocarditis of aortic valve, history of mechanical aortic valve replacement, and stroke who presents with a fall and right femoral neck fracture. S/p right THA, direct anterior approach.     PT Comments    Pt progressing towards his physical therapy goals, requiring less assist for functional mobility. Session focused on gait training prior to discharge home. Pt ambulating 60 feet with a walker; no balance issues noted but very slow and effortful. Pain control and RLE weakness remain limitations (pt premedicated). Will need to practice 2 steps in afternoon session to simulate home set up.      Follow Up Recommendations  Outpatient PT;Supervision for mobility/OOB     Equipment Recommendations  Rolling walker with 5" wheels;3in1 (PT); Wheelchair   Recommendations for Other Services       Precautions / Restrictions Precautions Precautions: Fall;Sternal Precaution Comments: Recent re-do sternotomy 07/21/2019 Restrictions Weight Bearing Restrictions: Yes RLE Weight Bearing: Weight bearing as tolerated    Mobility  Bed Mobility Overal bed mobility: Needs Assistance Bed Mobility: Supine to Sit     Supine to sit: Supervision     General bed mobility comments: Significantly increased time/effort, use of bed rail. Cues for hooking LLE underneath RLE to progress off edge of bed  Transfers Overall transfer level: Needs assistance Equipment used: Rolling walker (2 wheeled) Transfers: Sit to/from Stand Sit to Stand: Supervision         General transfer comment: Increased time/effort to rise, cues for hand placement  Ambulation/Gait Ambulation/Gait assistance: Supervision Gait Distance (Feet): 60 Feet Assistive device: Rolling walker (2 wheeled) Gait Pattern/deviations:  Decreased stance time - right;Decreased weight shift to right;Antalgic;Step-through pattern Gait velocity: decreased   General Gait Details: Cues for sequencing, use of arms on walker, segmental turning. Very slow pace and effortful, no physical assist required   Stairs             Wheelchair Mobility    Modified Rankin (Stroke Patients Only)       Balance Overall balance assessment: Needs assistance Sitting-balance support: Feet supported Sitting balance-Leahy Scale: Good     Standing balance support: Bilateral upper extremity supported Standing balance-Leahy Scale: Poor Standing balance comment: reliant on external support                            Cognition Arousal/Alertness: Awake/alert Behavior During Therapy: WFL for tasks assessed/performed Overall Cognitive Status: Within Functional Limits for tasks assessed                                        Exercises General Exercises - Lower Extremity Ankle Circles/Pumps: Both;10 reps;Supine Quad Sets: Right;10 reps;Supine    General Comments        Pertinent Vitals/Pain Pain Assessment: 0-10 Pain Score: 7  Pain Location: R hip with movement Pain Descriptors / Indicators: Grimacing;Guarding Pain Intervention(s): Limited activity within patient's tolerance;Monitored during session;Premedicated before session;Ice applied;Repositioned    Home Living                      Prior Function            PT Goals (current goals can now be found in  the care plan section) Acute Rehab PT Goals Patient Stated Goal: "walk normally." PT Goal Formulation: With patient Time For Goal Achievement: 09/14/19 Potential to Achieve Goals: Good Progress towards PT goals: Progressing toward goals    Frequency    Min 5X/week      PT Plan Current plan remains appropriate    Co-evaluation              AM-PAC PT "6 Clicks" Mobility   Outcome Measure  Help needed turning from  your back to your side while in a flat bed without using bedrails?: None Help needed moving from lying on your back to sitting on the side of a flat bed without using bedrails?: None Help needed moving to and from a bed to a chair (including a wheelchair)?: None Help needed standing up from a chair using your arms (e.g., wheelchair or bedside chair)?: None Help needed to walk in hospital room?: None Help needed climbing 3-5 steps with a railing? : A Lot 6 Click Score: 22    End of Session   Activity Tolerance: Patient tolerated treatment well Patient left: in chair;with call bell/phone within reach;with nursing/sitter in room Nurse Communication: Mobility status PT Visit Diagnosis: Pain;Difficulty in walking, not elsewhere classified (R26.2) Pain - Right/Left: Right Pain - part of body: Hip     Time: 0254-2706 PT Time Calculation (min) (ACUTE ONLY): 35 min  Charges:  $Gait Training: 23-37 mins                       Lillia Pauls, PT, DPT Acute Rehabilitation Services Pager [phone] Office [phone]    Norval Morton 09/01/2019, 11:25 AM

## 2019-09-01 NOTE — Telephone Encounter (Signed)
James Ferrell with Outpatient PT lvm on triage phone wanting a call back due to  questions about this pt's PT referral.   CB # [phone]

## 2019-09-01 NOTE — TOC Transition Note (Signed)
Transition of Care [facility]) - CM/SW Discharge Note   Patient Details  Name: James Ferrell MRN: [id number] Date of Birth: Jun 21, 1975  [facility]) CM/SW Contact:  Curlene Labrum, RN Phone Number: 09/01/2019, 4:08 PM   Clinical Narrative:    Case management met with PT at bedside and patient does not have a ride home other than [facility] lift to home.  The patient was not progressing well today - see PT note and would be safer to discharge home tomorrow so that family can pick the patient up in the car, pick up patient's medications and transport the wheelchair for the patient.  I was unable to provide for home health services for the patient due to staffing issues - so patient will need outpatient PT/Ot services.  [employer] was called for the patient to have wheelchair delivered to the hospital room and discharge home tomorrow.   Final next level of care: OP Rehab Barriers to Discharge: No Barriers Identified   Patient Goals and CMS Choice Patient states their goals for this hospitalization and ongoing recovery are:: Patient plans to discharge home with girlfriend - Lattie Haw is driving him home since his girfriend does not drive. CMS Medicare.gov Compare Post Acute Care list provided to:: Patient Choice offered to / list presented to : Patient  Discharge Placement                       Discharge Plan and Services   Discharge Planning Services: CM Consult Post Acute Care Choice: Durable Medical Equipment          DME Arranged: 3-N-1, Walker rolling DME Agency: [employer] Date DME Agency Contacted: 08/31/19 Time DME Agency Contacted: [phone] Representative spoke with at DME Agency: Jettie Booze            Social Determinants of Health ([place]) Interventions     Readmission Risk Interventions Readmission Risk Prevention Plan 08/31/2019  Transportation Screening Complete  PCP or Specialist Appt within 5-7 Days Complete  Home Care Screening Complete  Medication  Review (RN CM) Complete

## 2019-09-01 NOTE — Discharge Instructions (Signed)
1. Change dressings as needed 2. May shower but keep incisions covered and dry 3. Take blood thinners to prevent blood clots 4. Take stool softeners as needed 5. Take pain meds as needed  Dental Antibiotics:  In most cases prophylactic antibiotics for Dental procdeures after total joint surgery are not necessary.  Exceptions are as follows:  1. History of prior total joint infection  2. Severely immunocompromised (Organ Transplant, cancer chemotherapy, Rheumatoid biologic meds such as Humera)  3. Poorly controlled diabetes (A1C &gt; 8.0, blood glucose over 200)  If you have one of these conditions, contact your surgeon for an antibiotic prescription, prior to your dental procedure.     Information on my medicine - Coumadin   (Warfarin)  This medication education was reviewed with me or my healthcare representative as part of my discharge preparation.    Why was Coumadin prescribed for you? Coumadin was prescribed for you because you have a blood clot or a medical condition that can cause an increased risk of forming blood clots. Blood clots can cause serious health problems by blocking the flow of blood to the heart, lung, or brain. Coumadin can prevent harmful blood clots from forming. As a reminder your indication for Coumadin is:   Blood Clot Prevention After Heart Valve Surgery  What test will check on my response to Coumadin? While on Coumadin (warfarin) you will need to have an INR test regularly to ensure that your dose is keeping you in the desired range. The INR (international normalized ratio) number is calculated from the result of the laboratory test called prothrombin time (PT).  If an INR APPOINTMENT HAS NOT ALREADY BEEN MADE FOR YOU please schedule an appointment to have this lab work done by your health care provider within 7 days. Your INR goal is usually a number between:  2 to 3 or your provider may give you a more narrow range like 2-2.5.  Ask your  health care provider during an office visit what your goal INR is.  What  do you need to  know  About  COUMADIN? Take Coumadin (warfarin) exactly as prescribed by your healthcare provider about the same time each day.  DO NOT stop taking without talking to the doctor who prescribed the medication.  Stopping without other blood clot prevention medication to take the place of Coumadin may increase your risk of developing a new clot or stroke.  Get refills before you run out.  What do you do if you miss a dose? If you miss a dose, take it as soon as you remember on the same day then continue your regularly scheduled regimen the next day.  Do not take two doses of Coumadin at the same time.  Important Safety Information A possible side effect of Coumadin (Warfarin) is an increased risk of bleeding. You should call your healthcare provider right away if you experience any of the following: ? Bleeding from an injury or your nose that does not stop. ? Unusual colored urine (red or dark brown) or unusual colored stools (red or black). ? Unusual bruising for unknown reasons. ? A serious fall or if you hit your head (even if there is no bleeding).  Some foods or medicines interact with Coumadin (warfarin) and might alter your response to warfarin. To help avoid this: ? Eat a balanced diet, maintaining a consistent amount of Vitamin K. ? Notify your provider about major diet changes you plan to make. ? Avoid alcohol or limit your  intake to 1 drink for women and 2 drinks for men per day. (1 drink is 5 oz. wine, 12 oz. beer, or 1.5 oz. liquor.)  Make sure that ANY health care provider who prescribes medication for you knows that you are taking Coumadin (warfarin).  Also make sure the healthcare provider who is monitoring your Coumadin knows when you have started a new medication including herbals and non-prescription products.  Coumadin (Warfarin)  Major Drug Interactions  Increased Warfarin Effect  Decreased Warfarin Effect  Alcohol (large quantities) Antibiotics (esp. Septra/Bactrim, Flagyl, Cipro) Amiodarone (Cordarone) Aspirin (ASA) Cimetidine (Tagamet) Megestrol (Megace) NSAIDs (ibuprofen, naproxen, etc.) Piroxicam (Feldene) Propafenone (Rythmol SR) Propranolol (Inderal) Isoniazid (INH) Posaconazole (Noxafil) Barbiturates (Phenobarbital) Carbamazepine (Tegretol) Chlordiazepoxide (Librium) Cholestyramine (Questran) Griseofulvin Oral Contraceptives Rifampin Sucralfate (Carafate) Vitamin K   Coumadin (Warfarin) Major Herbal Interactions  Increased Warfarin Effect Decreased Warfarin Effect  Garlic Ginseng Ginkgo biloba Coenzyme Q10 Green tea St. John's wort    Coumadin (Warfarin) FOOD Interactions  Eat a consistent number of servings per week of foods HIGH in Vitamin K (1 serving =  cup)  Collards (cooked, or boiled & drained) Kale (cooked, or boiled & drained) Mustard greens (cooked, or boiled & drained) Parsley *serving size only =  cup Spinach (cooked, or boiled & drained) Swiss chard (cooked, or boiled & drained) Turnip greens (cooked, or boiled & drained)  Eat a consistent number of servings per week of foods MEDIUM-HIGH in Vitamin K (1 serving = 1 cup)  Asparagus (cooked, or boiled & drained) Broccoli (cooked, boiled & drained, or raw & chopped) Brussel sprouts (cooked, or boiled & drained) *serving size only =  cup Lettuce, raw (green leaf, endive, romaine) Spinach, raw Turnip greens, raw & chopped   These websites have more information on Coumadin (warfarin):  http://www.king-russell.com/; https://www.hines.net/;

## 2019-09-02 LAB — CBC
HCT: 34.8 % — ABNORMAL LOW (ref 39.0–52.0)
Hemoglobin: 10.1 g/dL — ABNORMAL LOW (ref 13.0–17.0)
MCH: 26.8 pg (ref 26.0–34.0)
MCHC: 29 g/dL — ABNORMAL LOW (ref 30.0–36.0)
MCV: 92.3 fL (ref 80.0–100.0)
Platelets: 251 10*3/uL (ref 150–400)
RBC: 3.77 MIL/uL — ABNORMAL LOW (ref 4.22–5.81)
RDW: 14.1 % (ref 11.5–15.5)
WBC: 8.1 10*3/uL (ref 4.0–10.5)
nRBC: 0 % (ref 0.0–0.2)

## 2019-09-02 LAB — PROTIME-INR
INR: 1.2 (ref 0.8–1.2)
Prothrombin Time: 14.2 seconds (ref 11.4–15.2)

## 2019-09-02 MED ORDER — ENOXAPARIN SODIUM 80 MG/0.8ML ~~LOC~~ SOLN
80.0000 mg | Freq: Two times a day (BID) | SUBCUTANEOUS | Status: DC
  Filled 2019-09-02 (×2): qty 0.8

## 2019-09-02 MED ORDER — WARFARIN SODIUM 5 MG PO TABS: 5.0000 mg | ORAL_TABLET | Freq: Once | ORAL | Status: DC

## 2019-09-02 NOTE — Plan of Care (Signed)

## 2019-09-02 NOTE — Telephone Encounter (Signed)
I tried to reach [place] x 2. Will wait for return call.

## 2019-09-02 NOTE — TOC Transition Note (Signed)
Transition of Care [facility]) - CM/SW Discharge Note   Patient Details  Name: James Ferrell MRN: [id number] Date of Birth: 1975/08/08  Transition of Care [facility]) CM/SW Contact:  Janae Bridgeman, RN Phone Number: 09/02/2019, 12:09 PM   Clinical Narrative:    Patient did not receive the wheelchair from [employer] last night since he did not have a credit card to reserve the rental.  The patient's friend was given contact information to pick up a wheelchair from [address] [employer] - from [employer] if he needs it.  The patient states that he is able to ambulate better today and declines the need for the wheelchair at this time.  The patient's friend, [name] is driving the patient home today.   Final next level of care: OP Rehab Barriers to Discharge: No Barriers Identified   Patient Goals and CMS Choice Patient states their goals for this hospitalization and ongoing recovery are:: Patient plans to discharge home with girlfriend - [name] is driving him home since his girfriend does not drive. CMS Medicare.gov Compare Post Acute Care list provided to:: Patient Choice offered to / list presented to : Patient  Discharge Placement                       Discharge Plan and Services   Discharge Planning Services: CM Consult Post Acute Care Choice: Durable Medical Equipment          DME Arranged: 3-N-1, Walker rolling DME Agency: [employer] Date DME Agency Contacted: 08/31/19 Time DME Agency Contacted: [phone] Representative spoke with at DME Agency: Verta Ellen            Social Determinants of Health ([place]) Interventions     Readmission Risk Interventions Readmission Risk Prevention Plan 08/31/2019  Transportation Screening Complete  PCP or Specialist Appt within 5-7 Days Complete  Home Care Screening Complete  Medication Review (RN CM) Complete

## 2019-09-02 NOTE — Progress Notes (Signed)
Physical Therapy Treatment Patient Details Name: James Ferrell MRN: [id number] DOB: [birth year] Today's Date: 09/02/2019    History of Present Illness Pt is a 44 y.o. M with significant PMH of HIV, endocarditis of aortic valve, history of mechanical aortic valve replacement, and stroke who presents with a fall and right femoral neck fracture. S/p right THA, direct anterior approach.     PT Comments    Pt with improved pain control and activity tolerance. Session focused on continued gait and stair training in preparation for discharge home. Pt performing functional mobility at a supervision level. Ambulating 40 feet with a walker and negotiated 2 steps with right railing sideways. Written HEP provided including quad sets, hip abduction, and LAQ's. Pt continues with RLE weakness, gait abnormalities, and pain. Will benefit from OPPT follow up to address deficits.     Follow Up Recommendations  Outpatient PT;Supervision for mobility/OOB     Equipment Recommendations  Rolling walker with 5" wheels;Wheelchair (measurements PT);Wheelchair cushion (measurements PT);3in1 (PT)    Recommendations for Other Services       Precautions / Restrictions Precautions Precautions: Fall;Sternal Precaution Comments: Recent re-do sternotomy 07/21/2019 Restrictions Weight Bearing Restrictions: Yes RLE Weight Bearing: Weight bearing as tolerated    Mobility  Bed Mobility Overal bed mobility: Needs Assistance Bed Mobility: Supine to Sit     Supine to sit: Supervision     General bed mobility comments: Using LLE to help progress RLE off edge of bed, use of bed rail  Transfers Overall transfer level: Needs assistance Equipment used: Rolling walker (2 wheeled) Transfers: Sit to/from Stand Sit to Stand: Supervision            Ambulation/Gait Ambulation/Gait assistance: Supervision Gait Distance (Feet): 40 Feet Assistive device: Rolling walker (2 wheeled) Gait Pattern/deviations:  Decreased stance time - right;Decreased weight shift to right;Antalgic;Step-to pattern;Decreased dorsiflexion - right Gait velocity: decreased Gait velocity interpretation: <1.8 ft/sec, indicate of risk for recurrent falls General Gait Details: Cues for sequencing, weightbearing through RLE, increased right foot clearance. Pt utilizing partial weightbearing through RLE.   Stairs Stairs: Yes Stairs assistance: Supervision Stair Management: One rail Right;Sideways Number of Stairs: 2 General stair comments: Pt ascending/descending 2 steps sideways with right railing. Cues for sequencing and technique.   Wheelchair Mobility    Modified Rankin (Stroke Patients Only)       Balance Overall balance assessment: Needs assistance Sitting-balance support: Feet supported Sitting balance-Leahy Scale: Good     Standing balance support: Bilateral upper extremity supported Standing balance-Leahy Scale: Poor Standing balance comment: reliant on external support                            Cognition Arousal/Alertness: Awake/alert Behavior During Therapy: WFL for tasks assessed/performed Overall Cognitive Status: Within Functional Limits for tasks assessed                                        Exercises      General Comments        Pertinent Vitals/Pain Pain Assessment: Faces Faces Pain Scale: Hurts even more Pain Location: R hip, vastus lateralis Pain Descriptors / Indicators: Grimacing;Guarding Pain Intervention(s): Monitored during session    Home Living                      Prior Function  PT Goals (current goals can now be found in the care plan section) Acute Rehab PT Goals Patient Stated Goal: "walk normally." Potential to Achieve Goals: Good Progress towards PT goals: Progressing toward goals    Frequency    Min 5X/week      PT Plan Current plan remains appropriate    Co-evaluation               AM-PAC PT "6 Clicks" Mobility   Outcome Measure  Help needed turning from your back to your side while in a flat bed without using bedrails?: None Help needed moving from lying on your back to sitting on the side of a flat bed without using bedrails?: None Help needed moving to and from a bed to a chair (including a wheelchair)?: None Help needed standing up from a chair using your arms (e.g., wheelchair or bedside chair)?: None Help needed to walk in hospital room?: None Help needed climbing 3-5 steps with a railing? : None 6 Click Score: 24    End of Session Equipment Utilized During Treatment: Gait belt Activity Tolerance: Patient tolerated treatment well Patient left: in chair;with call bell/phone within reach Nurse Communication: Mobility status PT Visit Diagnosis: Pain;Difficulty in walking, not elsewhere classified (R26.2) Pain - Right/Left: Right Pain - part of body: Hip     Time: 8882-8003 PT Time Calculation (min) (ACUTE ONLY): 32 min  Charges:  $Gait Training: 23-37 mins                       James Ferrell, PT, DPT Acute Rehabilitation Services Pager [phone] Office [phone]    James Ferrell 09/02/2019, 12:48 PM

## 2019-09-02 NOTE — Telephone Encounter (Signed)
I left voicemail for James Ferrell requesting return call. 

## 2019-09-02 NOTE — Progress Notes (Signed)
ANTICOAGULATION CONSULT NOTE   Pharmacy Consult for warfarin (on enoxaparin bridge) Indication: Mechanical AVR  No Known Allergies  Patient Measurements: Height: 5\' 11"  (180.3 cm) Weight: 83.9 kg (184 lb 15.5 oz) IBW/kg (Calculated) : 75.3 Heparin Dosing Weight: 83.9 kg  Vital Signs: Temp: 98.6 F (37 C) (08/11 0832) Temp Source: Oral (08/11 0832) BP: 120/76 (08/11 0832) Pulse Rate: 97 (08/11 0832)  Labs: Recent Labs    08/31/19 0250 08/31/19 0250 08/31/19 2232 09/01/19 0336 09/01/19 1214 09/02/19 0432  HGB 10.6*   < >  --  10.5*  --  10.1*  HCT 35.4*  --   --  35.9*  --  34.8*  PLT 243  --   --  221  --  251  LABPROT  --   --   --  13.7  --  14.2  INR  --   --   --  1.1  --  1.2  HEPARINUNFRC  --   --  <0.10* <0.10* <0.10*  --   CREATININE 1.02  --   --  1.01  --   --    < > = values in this interval not displayed.    Estimated Creatinine Clearance: 102.5 mL/min (by C-G formula based on SCr of 1.01 mg/dL).   Medical History: Past Medical History:  Diagnosis Date  . CHB (complete heart block) (HCC) 2020  . 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 H/O mechanical aortic valve replacement   . HIV (human immunodeficiency virus infection) (HCC)   . Stroke [facility])     Assessment: 44 yo man on warfarin PTA for mechanical AVR (INR goal 2.5-3.5; PTA warfarin dose 5 mg on Mon, Fri and 2.5 mg on all other days).  Pharmacy consulted to resume warfarin post-op with enoxaparin bridging.  Today, INR remains subtherapeutic but trended up ever so slightly after restarting warfarin post-op; bridging with enoxaparin 1mg /kg q12h and SCDs on board. CBC stable from previous, and no overt bleeding noted. Patient was up for discharge yesterday but remains admitted after afternoon evaluation by PT on 8/10. Hopeful for discharge today.    Goal of Therapy:  INR 2.5-3.5 Monitor platelets by anticoagulation protocol: Yes   Plan:  Warfarin 5mg  x1 today  Continue  enoxaparin 80mg  subcutaneously q12h Daily PT/INR, CBC Monitor for s/sx bleeding   , PharmD Clinical Pharmacist  09/02/2019   9:45 AM   Please check AMION for all [facility] phone numbers After 10:00 PM, call the Main Pharmacy [phone]

## 2019-09-02 NOTE — Plan of Care (Signed)
  Problem: Education: Goal: Knowledge of General Education information will improve Description: Including pain rating scale, medication(s)/side effects and non-pharmacologic comfort measures 09/02/2019 1309 by Lenise Arena, RN Outcome: Adequate for Discharge 09/02/2019 0730 by Lenise Arena, RN Outcome: Progressing   Problem: Activity: Goal: Risk for activity intolerance will decrease 09/02/2019 1309 by Lenise Arena, RN Outcome: Adequate for Discharge 09/02/2019 0730 by Lenise Arena, RN Outcome: Progressing   Problem: Nutrition: Goal: Adequate nutrition will be maintained 09/02/2019 1309 by Lenise Arena, RN Outcome: Adequate for Discharge 09/02/2019 0730 by Lenise Arena, RN Outcome: Progressing   Problem: Pain Managment: Goal: General experience of comfort will improve 09/02/2019 1309 by Lenise Arena, RN Outcome: Adequate for Discharge 09/02/2019 0730 by Lenise Arena, RN Outcome: Progressing   Problem: Increased Nutrient Needs (NI-5.1) Goal: Food and/or nutrient delivery Description: Individualized approach for food/nutrient provision. Outcome: Adequate for Discharge

## 2019-09-02 NOTE — Progress Notes (Signed)
Patient d/c'd on 8/10, please see d/c by Dr. Jacqulyn Bath.  D/C held due to question of not being safe at home.  PT to see today and plan is to d/c home after.  Patient to follow up for INR check later this week at [facility]. Marlin Canary DO

## 2019-09-02 NOTE — Progress Notes (Signed)
Patient ID: James Ferrell, male   DOB: [birth year], 44 y.o.   MRN: [id number]  Saw the patient this morning as PTA Aimee Aundria Rud was very concerned with the patient's right lateral thigh pain.  He reports the pain he is having is to the anterior thigh, from the groin to the knee.  This same pain has been here since surgery.  He reports no worsening pain and no new injury.  He notes that he did have some difficulty yesterday trying to lift his leg during stair training.  I have reassured him that this is normal nerve pain from the hip joint that can occur after surgery.  He will continue to ice and mobilize with PT until d/c.  He will follow up with [place] 2 weeks post-op for suture removal.

## 2019-09-03 ENCOUNTER — Ambulatory Visit
Admit: 2019-09-03 | Payer: MEDICAID | Attending: Student in an Organized Health Care Education/Training Program | Primary: Student in an Organized Health Care Education/Training Program

## 2019-09-03 NOTE — Unmapped (Unsigned)
DIVISION OF CARDIOLOGY  [employer] [place]        Date of Service: 09/03/2019    PCP: Referring Provider:   Idelia Salm, MD  [address] [place] [address]  [place] [address]  Phone: [phone]  Fax: [phone] Donetta Potts, MD  [address]  [place] [address]  [place],  [place] [address]  Phone: [phone]  Fax: [phone]     Assessment and Plan:     ***:    The patient was seen and discussed with Dr. Jon Billings.    No follow-ups on file.    Subjective:     Reason for consultation: history of native and prosthetic AoV endocarditis     HPI: Warren [name] is a 44 y.o. 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 The patient is seen at the request of Donetta Potts for evaluation of endocarditis of both native and prosthetic aortic valve. He was admitted to the [facility] cardiology service from 5/3-5/11 with prosthetic AV endocarditis. SRS was consulted but deemed patient too high-risk for reoperation.     He was subsequently referred to [facility] where he underwent a redo [place] with mechanical AVR (On-X) on 07/21/19. Plan was for 2 weeks of IV cefazolin post-surgery (end-date 7/14).     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 He was discharged with IV antibiotics on 5/11 on nafcillin 2g q4 till 6/13 (6 weeks), Gentamicin 3mg /kg/24 for 2 weeks (to end 5/22), and Rifampin 300 mg q8 till 6/13 (6 weeks). He was evaluated for high risk redo surgery??@ [place] but referred to [facility] as he was considered too high risk. It was recommended to proceed with completion of 6 weeks of antibiotics and then redo-root replacement.????He was managed as an outpatient with [facility] Infectious Disease and completed IV Cefazolin and Rifampin on 6/18. He was started on Keflex for suppressive therapy through surgery on 6/29.??His risk factor for these infections is not clear to me-denies IVDU.     Cardiovascular history:  Infective endocarditis of the aortic valve complicated by aortic insufficient and root abscess (status post homograft in 02/2018)    Cardiac medications:  Metoprolol tartrate 25 mg BID  Warfarin 2.5 mg QHS  Pravastatin 80 mg QD    Past medical history:  Patient Active Problem List   Diagnosis   ??? Acute bacterial endocarditis   ??? History of prosthetic aortic valve   ??? HIV (human immunodeficiency virus infection) (CMS-HCC)   ??? Bacteremia due to Staphylococcus aureus   ??? Tobacco use disorder   ??? AKI (acute kidney injury) (CMS-HCC)   ??? Severe aortic insufficiency   ??? HLD (hyperlipidemia)   ??? CVA (cerebral vascular accident) (CMS-HCC)   ??? Status post mechanical aortic valve replacement       Medications:   Patient's Medications   New Prescriptions    No medications on file  Previous Medications    DOLUTEGRAVIR (TIVICAY) 50 MG TABLET    Take 1 tablet (50 mg total) by mouth daily.    EMTRICITABINE-TENOFOVIR, TDF, (TRUVADA) 200-300 MG PER TABLET    Take 1 tablet by mouth daily.    ENOXAPARIN (LOVENOX) 100 MG/ML INJECTION    Inject 0.9 mL (90 mg total) under the skin Two (2) times a day.    METOPROLOL TARTRATE (LOPRESSOR) 25 MG TABLET    Take 25 mg by mouth every twelve (12) hours.    PRAVASTATIN (PRAVACHOL) 80 MG TABLET    Take 1 tablet (80 mg total) by mouth daily.    WARFARIN (JANTOVEN) 2.5 MG TABLET    5 mg Monday and Friday and 2.5 mg other days   Modified Medications    No medications on file   Discontinued Medications    No medications on file       Allergies:  No Known Allergies    Social history:  He  reports that he has been smoking cigarettes. He has been smoking about 0.25 packs per day. He has never used smokeless tobacco. He reports current alcohol use of about 2.0 standard drinks of alcohol per week. He reports previous drug use. Drug: Marijuana.    Family history:  His family history includes Mental illness in his mother; No Known Problems in his brother, father, and sister.    ROS: 10 systems were reviewed and negative except as noted in HPI.     Objective:      There were no vitals taken for this visit.   Wt Readings from Last 3 Encounters:   08/21/19 92.1 kg (203 lb)   07/10/19 98.4 kg (217 lb)   06/23/19 (!) 102.2 kg (225 lb 3.2 oz)       Physical Exam:  GEN: nontoxic-appearing, no acute distress  CV: normal rate, regular rhythm, no murmurs rubs or gallops, no carotid bruits, no JVD at 90?? with hepatojugular reflux  PULM: CTAB, no increased work of breathing  ABD: soft, nondistended, nontender  EXT: no peripheral edema, warm with intact distal pulses  NEURO: alert, oriented, no focal deficits    Most recent labs:  Lab Results   Component Value Date    Sodium 135 07/10/2019    Potassium 3.9 07/10/2019    Chloride 106 07/10/2019    CO2 24.9 07/10/2019    BUN 12 07/10/2019    Creatinine 1.33 (H) 07/10/2019    Magnesium 2.2 06/02/2019     Lab Results   Component Value Date    HGB 12.6 (L) 07/10/2019    MCV 96.1 (H) 07/10/2019    Platelet 277 07/10/2019     Lab Results   Component Value Date    Cholesterol 134 05/25/2019    Triglycerides 256 (H) 05/25/2019    HDL 29 (L) 05/25/2019    Non-HDL Cholesterol 105 05/25/2019    LDL Calculated 54 (L) 05/25/2019    Hemoglobin A1C <4.0 (L) 07/10/2019    INR, POC 1.2 08/21/2019       ECG:   *** - (personally reviewed)    Echocardiogram:     TTE (05/26/19):  -S/P aortic homograft 2020. Paravalvular echo lucency measuring  approximately 2cm wide as previously seen on TEE. It extends 180 degrees along  the right border of the homograft. There appears to be dehiscence and rocking  of the aortic homograft.  -There is mild to moderate regurgitation of the prosthetic aortic valve.  -The left ventricle is normal in size  with normal wall thickness.  -The left ventricular systolic function is mildly decreased, LVEF is  visually estimated at 45-50%.  -There is decreased contractile function involving the apical segment(s).  -There is mild mitral valve regurgitation.  -The right ventricle is normal in size, with normal systolic function.  -There is no evidence of a significant pericardial effusion.    LHC: N/A    Stress test: N/A    Ambulatory monitor: N/A

## 2019-09-04 ENCOUNTER — Telehealth: Payer: Self-pay

## 2019-09-04 DIAGNOSIS — Z09 Encounter for follow-up examination after completed treatment for conditions other than malignant neoplasm: Principal | ICD-10-CM

## 2019-09-04 NOTE — Telephone Encounter (Signed)
I left voicemail for Shanda Bumps requesting return call.

## 2019-09-04 NOTE — Telephone Encounter (Signed)
Army Fossa with [facility] outpatient PT would like a call back at [phone].  Please advise.  Thank you.

## 2019-09-04 NOTE — Telephone Encounter (Signed)
James Ferrell patient. I have left voicemail for Shanda Bumps to return call. Have not heard back.

## 2019-09-04 NOTE — Unmapped (Signed)
Called to reschedule missed appointment

## 2019-09-05 NOTE — Anesthesia Postprocedure Evaluation (Signed)
Anesthesia Post Note  Patient: James Ferrell  Procedure(s) Performed: TOTAL HIP ARTHROPLASTY ANTERIOR APPROACH (Right Hip)     Patient location during evaluation: PACU Anesthesia Type: General Level of consciousness: awake and alert Pain management: pain level controlled Vital Signs Assessment: post-procedure vital signs reviewed and stable Respiratory status: spontaneous breathing, nonlabored ventilation, respiratory function stable and patient connected to nasal cannula oxygen Cardiovascular status: blood pressure returned to baseline and stable Postop Assessment: no apparent nausea or vomiting Anesthetic complications: no   No complications documented.  Last Vitals:  Vitals:   09/02/19 0325 09/02/19 0832  BP: 121/74 120/76  Pulse: 89 97  Resp: 17 17  Temp: 37.1 C 37 C  SpO2: 98% 100%    Last Pain:  Vitals:   09/02/19 0832  TempSrc: Oral  PainSc:                  [name]

## 2019-09-07 NOTE — Unmapped (Signed)
Duration of Intervention: 10 min     SW called patient to check in regarding coordination of appointments and transportation. Patient did not answer. SW left a discreet message for a call back. No response from patient yet.     Patient called this social worker back.  Patient reported that he was working on getting a physical therapist for his hip. Patient also reported that he rescheduled his internal medicine appointment with [facility]. Patient reported no need for help with coordination of healthcare or transportation at this time but that he would reach out if he found it to be too overwhelming or needed assistance. Patient was educated that with change of medicaid he could now get transportation Monday through Friday and no longer has to make appointments on Fridays. Patient was very happy to hear this news.     Patient also asked about qualification for getting in home therapy with his insurance. SW provided phone number to [facility] for patient to call and inquire what services he qualifies for. SW will continue to provide assistance when needed.     Rajiv Parlato LCSW, LCAS  [facility] Social Work   Direct: [phone]

## 2019-09-08 ENCOUNTER — Telehealth: Payer: Self-pay

## 2019-09-08 NOTE — Unmapped (Unsigned)
Duration of Intervention: 20 min      REASON/TYPE OF CONTACT: Phone, Patient expressed difficulty setting up medicaid transportation for his appointment on Friday.     ASSESSMENT: Patient expressed that he tried to call medicaid number for transportation and was transferred to many different people without getting the the transportation department. Patient participated in setting up transportation with sw assistance. Patient demonstrated understanding and expressed gratitude for this social workers help.     INTERVENTION:  SW assisted patient with setting up transportation for Friday appointment. Patient demonstrated understanding by calling transportation line and confirming appointment.     PLAN:  Patient will follow up with social worker if he has any issues with transportation on Friday. Patient will meet with social worker on Friday after ID clinic appointment.     Dorla Guizar LCSW, LCAS  [facility] Social Work   Direct: [phone]

## 2019-09-08 NOTE — Telephone Encounter (Signed)
Army Fossa would like a call back concerning patient.  Cb# [phone].  Please advise.  Thank you.

## 2019-09-09 ENCOUNTER — Other Ambulatory Visit (INDEPENDENT_AMBULATORY_CARE_PROVIDER_SITE_OTHER): Payer: Self-pay | Admitting: Orthopaedic Surgery

## 2019-09-09 ENCOUNTER — Telehealth: Payer: Self-pay | Admitting: Orthopaedic Surgery

## 2019-09-09 NOTE — Telephone Encounter (Signed)
Patient called. He would like a refill on oxycodone.

## 2019-09-09 NOTE — Telephone Encounter (Signed)
Called Jessica back. No answer LMOM to return my call.

## 2019-09-10 ENCOUNTER — Other Ambulatory Visit: Payer: Self-pay | Admitting: Physician Assistant

## 2019-09-10 MED ORDER — OXYCODONE-ACETAMINOPHEN 5-325 MG PO TABS: 1.0000 | ORAL_TABLET | Freq: Three times a day (TID) | ORAL | 0 refills | Status: AC | PRN

## 2019-09-10 MED ORDER — OXYCODONE-ACETAMINOPHEN 5 MG-325 MG TABLET
Freq: Three times a day (TID) | ORAL | 0.00000 days
Start: 2019-09-10 — End: ?

## 2019-09-10 NOTE — Telephone Encounter (Signed)
Sent in

## 2019-09-11 ENCOUNTER — Other Ambulatory Visit: Payer: Self-pay | Admitting: Physician Assistant

## 2019-09-11 ENCOUNTER — Encounter: Admit: 2019-09-11 | Discharge: 2019-09-12 | Payer: MEDICAID

## 2019-09-11 DIAGNOSIS — Z21 Asymptomatic human immunodeficiency virus [HIV] infection status: Principal | ICD-10-CM

## 2019-09-11 DIAGNOSIS — I639 Cerebral infarction, unspecified: Principal | ICD-10-CM

## 2019-09-11 DIAGNOSIS — Z952 Presence of prosthetic heart valve: Principal | ICD-10-CM

## 2019-09-11 MED ORDER — ENOXAPARIN 100 MG/ML SUBCUTANEOUS SYRINGE
Freq: Two times a day (BID) | SUBCUTANEOUS | 2 refills | 11.00000 days | Status: CP
Start: 2019-09-11 — End: ?

## 2019-09-11 MED ORDER — WARFARIN 2.5 MG TABLET
ORAL_TABLET | 11 refills | 0 days | Status: CP
Start: 2019-09-11 — End: ?

## 2019-09-11 MED ORDER — PRAVASTATIN 80 MG TABLET
ORAL_TABLET | Freq: Every day | ORAL | 3 refills | 90.00000 days | Status: CP
Start: 2019-09-11 — End: ?

## 2019-09-11 MED ORDER — TIVICAY 50 MG TABLET
ORAL_TABLET | Freq: Every day | ORAL | 2 refills | 60 days
Start: 2019-09-11 — End: ?

## 2019-09-11 NOTE — Telephone Encounter (Signed)
See message.  Thanks.

## 2019-09-11 NOTE — Telephone Encounter (Signed)
I sent in yesterday

## 2019-09-11 NOTE — Unmapped (Signed)
INR    1.1      SEC   12.9

## 2019-09-11 NOTE — Unmapped (Signed)
Assessment/Plan:     Current warfarin Dose:  5mg  MF  2.5mg  others + LMWH 90mg  BID  Steady State:yes  Date INR: September 11, 2019  Goal INR: 2-3  INR Results: 1.1  Lab Results   Component Value Date    INR, POC 1.1 09/11/2019    INR, POC 1.2 08/21/2019       ASSESSMENT/PLAN:  Plan for Warfarin Dose:  increase dose: 2.5mg  M Th  5mg  others + LMWH 90mg  BID    1. mech AVR 2/2 bacterial endocarditis - valve placement in 7/21.  INR subtherapeutic, reported to be adherent.  Continue LMWH bridge until therapeutic given h/o CVA (4yr ago)  2. Recent R THR at [facility] - followed by [facility] ortho    Repeat INR Date:  1 wk    Subject/Objective:     Patient: Warren [name]. 44 y.o.    Medical Record Number:  [id number]    PRIMARY CARE PHYSICIAN: Idelia Salm, MD    XB:[id number] mechanical heart valve, hip pain    PMHx:   Patient Active Problem List   Diagnosis   ??? Acute bacterial endocarditis   ??? History of prosthetic aortic valve   ??? HIV (human immunodeficiency virus infection) (CMS-HCC)   ??? Bacteremia due to Staphylococcus aureus   ??? Tobacco use disorder   ??? AKI (acute kidney injury) (CMS-HCC)   ??? Severe aortic insufficiency   ??? HLD (hyperlipidemia)   ??? CVA (cerebral vascular accident) (CMS-HCC)   ??? Status post mechanical aortic valve replacement          Your Medication List          Accurate as of September 11, 2019 10:33 AM. If you have any questions, ask your nurse or doctor.            CHANGE how you take these medications    warfarin 2.5 MG tablet  Commonly known as: JANTOVEN  2.5mg  (1 tab) Mon, Thur and 5mg  (2 tabs) other days or as directed  What changed: additional instructions        CONTINUE taking these medications    emtricitabine-tenofovir (TDF) 200-300 mg per tablet  Commonly known as: TRUVADA  Take 1 tablet by mouth daily.     enoxaparin 100 mg/mL injection  Commonly known as: LOVENOX  Inject 0.9 mL (90 mg total) under the skin Two (2) times a day.     metoprolol tartrate 25 MG tablet  Commonly known as: LOPRESSOR  Take 25 mg by mouth every twelve (12) hours.     pravastatin 80 MG tablet  Commonly known as: PRAVACHOL  Take 1 tablet (80 mg total) by mouth daily.     TIVICAY 50 mg TABLET  Generic drug: dolutegravir  Take 1 tablet (50 mg total) by mouth daily.              General:  Well appearing, no acute distress    Signs/Symptoms bleeding or bruising: no    Signs/Symptoms of Potential Embolic Events: no    Changes In:   Diet:no   Medications:no   Activities / Lifestyle:no   Health Status:no   Nonadherent: no       Current EtOH Use:no   Current Smoker/tobacco user: no    Medications reviewed including OTC and herbals:yes    The patient set a personal goal for anticoagulation management today:Take warfarin daily as recommended.    Medication adherence and barriers to the treatment plan have been addressed. Opportunities to optimize healthy behaviors  have been discussed. Patient / caregiver voiced understanding.    Additional History:

## 2019-09-15 ENCOUNTER — Ambulatory Visit: Payer: Medicaid Other | Admitting: Physical Therapy

## 2019-09-15 NOTE — Unmapped (Unsigned)
Medical Case Management Social Work Note     Duration of Intervention:  10 min     REASON/TYPE OF CONTACT: Phone, SW called patient to remind of appointments coming up for 8/27 and to ask if he needed any assistance with transportation.     ASSESSMENT: Patient reported that he was glad this social worker called because he forgot about the upcoming appointments. Patient confirmed that he would be able to get to appointments. Patient reports that he was going to call Medicaid [employer] transportation after getting off the phone with this Child psychotherapist. Patient said that he wanted to try by himself and if he has any issues he would call this social worker back.     ADHERENCE: Did not discuss at this interaction due to competing priorities and/or no concerns.    INTERVENTION:  SW reminded patient of appointments and encouraged patient to call with any issues.     PLAN:  SW will meet with patient at ID clinic appointment on 09/18/19 at 9:30am.     Lauretta Chester, LCAS  [facility] Social Work   Direct: [phone]

## 2019-09-17 DIAGNOSIS — Z21 Asymptomatic human immunodeficiency virus [HIV] infection status: Principal | ICD-10-CM

## 2019-09-17 NOTE — Unmapped (Signed)
[facility] Refill Coordination Note    Specialty Medication(s) to be Shipped:   Infectious Disease: Tivicay and Truvada    Other medication(s) to be shipped: No additional medications requested for fill at this time     Warren [name]., DOB: 25-Nov-1975  Phone: [phone] (home)       All above HIPAA information was verified with patient.     Was a Nurse, learning disability used for this call? No    Completed refill call assessment today to schedule patient's medication shipment from the [facility] [phone]).       Specialty medication(s) and dose(s) confirmed: Regimen is correct and unchanged.   Changes to medications: Warren [name] reports no changes at this time.  Changes to insurance: No  Questions for the pharmacist: No    Confirmed patient received Welcome Packet with first shipment. The patient will receive a drug information handout for each medication shipped and additional FDA Medication Guides as required.       DISEASE/MEDICATION-SPECIFIC INFORMATION        N/A    SPECIALTY MEDICATION ADHERENCE     Medication Adherence    Patient reported X missed doses in the last month: 0  Specialty Medication: emtricitabine-tenofovir (TDF) 200-300 mg  Patient is on additional specialty medications: Yes  Additional Specialty Medications: Tivicay  Patient Reported Additional Medication X Missed Doses in the Last Month: 0  Patient is on more than two specialty medications: No  Any gaps in refill history greater than 2 weeks in the last 3 months: no  Demonstrates understanding of importance of adherence: yes  Informant: patient  Reliability of informant: reliable  Confirmed plan for next specialty medication refill: delivery by pharmacy  Refills needed for supportive medications: not needed                Truvada. 13 days on hand  Tivicay. 13 days on hand      SHIPPING     Shipping address confirmed in Epic.     Delivery Scheduled: Yes, Expected medication delivery date: 09/22/2019.  However, Rx request for refills was sent to the provider as there are none remaining.     Medication will be delivered via UPS to the prescription address in Epic WAM.    Warren [name]   [facility] Specialty Technician

## 2019-09-18 ENCOUNTER — Encounter: Admit: 2019-09-18 | Discharge: 2019-09-18 | Payer: MEDICAID

## 2019-09-18 ENCOUNTER — Ambulatory Visit: Admit: 2019-09-18 | Discharge: 2019-09-18 | Payer: MEDICAID

## 2019-09-18 ENCOUNTER — Ambulatory Visit: Admit: 2019-09-18 | Discharge: 2019-09-19 | Payer: MEDICAID | Attending: Family | Primary: Family

## 2019-09-18 DIAGNOSIS — B20 Human immunodeficiency virus [HIV] disease: Principal | ICD-10-CM

## 2019-09-18 DIAGNOSIS — Z21 Asymptomatic human immunodeficiency virus [HIV] infection status: Principal | ICD-10-CM

## 2019-09-18 DIAGNOSIS — Z96641 Presence of right artificial hip joint: Principal | ICD-10-CM

## 2019-09-18 DIAGNOSIS — Z952 Presence of prosthetic heart valve: Principal | ICD-10-CM

## 2019-09-18 DIAGNOSIS — A630 Anogenital (venereal) warts: Principal | ICD-10-CM

## 2019-09-18 DIAGNOSIS — S72001A Fracture of unspecified part of neck of right femur, initial encounter for closed fracture: Principal | ICD-10-CM

## 2019-09-18 LAB — AST: AST (SGOT): 30 U/L (ref <=34)

## 2019-09-18 LAB — CBC W/ AUTO DIFF
BASOPHILS ABSOLUTE COUNT: 0.1 10*9/L (ref 0.0–0.1)
BASOPHILS RELATIVE PERCENT: 1.2 %
EOSINOPHILS ABSOLUTE COUNT: 0.2 10*9/L (ref 0.0–0.7)
EOSINOPHILS RELATIVE PERCENT: 3 %
HEMATOCRIT: 38.4 % (ref 38.0–50.0)
HEMOGLOBIN: 12.1 g/dL — ABNORMAL LOW (ref 13.5–17.5)
LYMPHOCYTES ABSOLUTE COUNT: 1.9 10*9/L (ref 0.7–4.0)
LYMPHOCYTES RELATIVE PERCENT: 34.3 %
MEAN CORPUSCULAR HEMOGLOBIN CONC: 31.4 g/dL (ref 30.0–36.0)
MEAN CORPUSCULAR HEMOGLOBIN: 26.5 pg (ref 26.0–34.0)
MEAN CORPUSCULAR VOLUME: 84.4 fL (ref 81.0–95.0)
MEAN PLATELET VOLUME: 7.1 fL (ref 7.0–10.0)
MONOCYTES ABSOLUTE COUNT: 0.5 10*9/L (ref 0.1–1.0)
MONOCYTES RELATIVE PERCENT: 8.3 %
NEUTROPHILS ABSOLUTE COUNT: 2.9 10*9/L (ref 1.7–7.7)
NEUTROPHILS RELATIVE PERCENT: 53.2 %
NUCLEATED RED BLOOD CELLS: 0 /100{WBCs} (ref <=4)
PLATELET COUNT: 505 10*9/L — ABNORMAL HIGH (ref 150–450)
RED BLOOD CELL COUNT: 4.55 10*12/L (ref 4.32–5.72)
RED CELL DISTRIBUTION WIDTH: 15.5 % — ABNORMAL HIGH (ref 12.0–15.0)
WBC ADJUSTED: 5.5 10*9/L (ref 3.5–10.5)

## 2019-09-18 LAB — BASIC METABOLIC PANEL
ANION GAP: 5 mmol/L (ref 5–14)
BLOOD UREA NITROGEN: 13 mg/dL (ref 9–23)
BUN / CREAT RATIO: 12
CALCIUM: 10.2 mg/dL (ref 8.7–10.4)
CHLORIDE: 110 mmol/L — ABNORMAL HIGH (ref 98–107)
CO2: 23.6 mmol/L (ref 20.0–31.0)
CREATININE: 1.05 mg/dL
EGFR CKD-EPI AA MALE: >90 mL/min/{1.73_m2} (ref >=60)
EGFR CKD-EPI NON-AA MALE: 88 mL/min/{1.73_m2} (ref >=60)
GLUCOSE RANDOM: 93 mg/dL (ref 70–179)
POTASSIUM: 4.2 mmol/L (ref 3.4–4.5)
SODIUM: 139 mmol/L (ref 135–145)

## 2019-09-18 LAB — BILIRUBIN, TOTAL: BILIRUBIN TOTAL: 0.5 mg/dL (ref 0.3–1.2)

## 2019-09-18 LAB — ALT: ALT (SGPT): 32 U/L (ref 10–49)

## 2019-09-18 MED ORDER — BICTEGRAVIR 50 MG-EMTRICITABINE 200 MG-TENOFOVIR ALAFENAM 25 MG TABLET
ORAL_TABLET | Freq: Every day | ORAL | 5 refills | 30.00000 days | Status: CP
Start: 2019-09-18 — End: ?
  Filled 2019-09-18: qty 30, 30d supply, fill #0

## 2019-09-18 MED ORDER — WARFARIN 2.5 MG TABLET
ORAL_TABLET | 11 refills | 0.00000 days | Status: CP
Start: 2019-09-18 — End: ?

## 2019-09-18 MED ORDER — TIVICAY 50 MG TABLET
ORAL_TABLET | Freq: Every day | ORAL | 2 refills | 60.00000 days | Status: CN
Start: 2019-09-18 — End: ?

## 2019-09-18 MED FILL — BIKTARVY 50 MG-200 MG-25 MG TABLET: 30 days supply | Qty: 30 | Fill #0 | Status: AC

## 2019-09-18 NOTE — Unmapped (Signed)
INR   1.1      SEC   13.2

## 2019-09-18 NOTE — Unmapped (Signed)
[facility] INFECTIOUS DISEASES CLINIC FOLLOW UP VISIT    Assessment/Recommendations:  Warren Falco. is being seen in the Infectious Diseases Clinic for HIV follow up.      Warren [name] was seen today for hiv positive/aids and follow-up.    Diagnoses and all orders for this visit:    HIV disease (CMS-HCC)  -     ALT  -     AST  -     Bilirubin, total  -     Basic Metabolic Panel  -     CBC w/ Differential  -     HIV RNA, Quantitative, PCR  -     Lymphocyte Markers Limited  -     bictegrav-emtricit-tenofov ala (BIKTARVY) 50-200-25 mg tablet; Take 1 tablet by mouth daily.  -     Ambulatory referral to GI Surgery; Future    Genital warts  -     Ambulatory referral to GI Surgery; Future    Status post right hip replacement    Status post mechanical aortic valve replacement      HIV  Doing well overall. Fills ART via Medicaid.   ?? According to notes, patient was diagnosed in 2008 after routine STI testing.   ?? Denies prior opportunistic infections.  ?? Has been non adherent with ART in the past. Last taking Symtuza.  ?? 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  ?? Patient has been out of care since moving to [place] from [place] last year in 06/2018.  ?? Now that patient is off all antibiotics and post-surgical, will switch him to single dose regimen, Biktarvy.   ?? Checking CD4, HIV RNA, & safety labs (full return).  ?? Encouraged continued excellent ARV adherence.   ?? No need for OI prophylaxis as CD4= >200  ?? Now receiving ART via mail order through [facility].    Lab Results   Component Value Date    ACD4 264 (L) 06/24/2019    CD4 24 (L) 06/24/2019    HIVCP <40 (H) 05/26/2019    HIVRS Not Detected 06/24/2019     Genital warts, chronic  ?? Diagnosed in [place]. Evaluated with previous PCP in [place], first treated with cryotherapy and then Condylox without relief.   ?? Patient requests further treatment.  ?? Refer to GI surgery for surgical removal of warts.    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    ?? On 4/17 patient presented to OSH [facility]) with fevers/chillls/weakness with MSSA cultures positive. TEE on 4/21 showed a 2x2 cm vegetation on the non-coronary cusp on the aortic valve with mild-mod AI.  ?? Treated with Vancomycin and Cefepime, then narrowed to Cefazolin.  ?? 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 Pacemaker evaluation showed Medtronic Azure Dual Chamber Pacemaker, EKG &??telemetry demonstrating a-sensed, v-paced rhythm. Repeat TTE 5/4 showed Paravalvular echo lucency measuring approximately 2cm wide as previously seen on TEE. It extends 180 degrees along the right border of the homograft. There appears to be dehiscence and rocking of the aortic homograft. His status is tenuous with extremely high risk of decompensation &??death should the graft fail.   ?? Transferred to [facility] on 05/26/19, antibiotics changed to [name] 2 g q 4h  x 6 weeks (05/26/19-07/05/19), Gentamicin x 2 weeks (05/26/19-06/13/19, completed) and Rifampin 300mg  q8h x 6 weeks (05/26/19-07/05/19)  ?? Underwent redo ASCENDING AORTA GRAFT, WITH CARDIOPULMONARY BYPASS, WITH AORTIC ROOT REPLACEMENT USING VALVED CONDUIT AND CORONARY RECONSTRUCTION (EG,BENTALL), redo sternotomy s/p Root replacement Feb 2020 with [name] at [facility], 07/21/19.  ?? On anticogaulation with Coumadin and Lovenox. INR goal of 2-3. Patient reports being at 1.1 after 5 weeks of Coumadin adjustment.    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 Complicated hospital course in 02/2018, see records in media tab for details. Summary below:  ?? 03/20/18: S/p aortic root replacement with homograft 25mm, reconstruction of aortomitral continuity, repair of right atrial fistula, mitral valve repair, Epicardial pacemaker placed for bradycardia   ?? History of bicuspid aortic valve with mod-severe AI  ?? AV vegetations found on TEE, blood cultures grew S. Pneumoniae  ?? Treated initially with Vancomycin and Ceftriaxone x 2 weeks to cover for possible meningitis as well and discharged to rehab on Ceftriaxone 2g IV daily x 4 weeks.     Sexual health & secondary prevention  Sex with women. Monogamous with single partner, although patient reports that his partner likely has outside partners. He does disclose status. Sometimes uses condoms.    Lab Results   Component Value Date    CTNAA Negative 06/23/2019    GCNAA Negative 06/23/2019    SPECTYPE Urine 06/23/2019    SPECSOURCE Urine 06/23/2019     ?? GC/CT NAATs -- tested on 06/23/19  ?? RPR -- NR 05/15/19 - repeat 1Y    Health maintenance  PCP: Dr. Johnsie Kindred  Lab Results   Component Value Date    CREATININE 1.33 (H) 07/10/2019    HEPCAB Nonreactive 07/10/2019    CHOL 134 05/25/2019    HDL 29 (L) 05/25/2019    LDL 54 (L) 05/25/2019    NONHDL 105 05/25/2019    TRIG 256 (H) 05/25/2019    A1C <4.0 (L) 07/10/2019     Communicable diseases  # TB - Needed  # HCV - negative 05/12/19; rescreen w/Ab q1-2y  05/12/19 Hep A IgM NR  05/12/19 Hep B C IgM NR  Cancer screening  # Anorectal - not yet done  # Colorectal - SCREEN AGE 32+  # Liver - not applicable  # Lung - SCREEN AGE 7-80 IF >30 PY & CURRENT OR QUIT <15Y AGO  # Prostate - SCREEN AGE 64 HI-RISK, 50 OTW -- Q2-4Y    Cardiovascular disease  # The ASCVD Risk score Denman George DC Montez Hageman, et al., 2013) failed to calculate.  - No aspirin  - On statin (Pravastatin)  - BP controlled  - Smoker, 0.5 PPD x 20 years, now down to 4-5 cig/day. Uninterested in smoking cessation at this time.    Immunization History   Administered Date(s) Administered   ??? COVID-19 VACC,MRNA,(PFIZER)(PF)(IM) 08/13/2019     ?? Screening ordered today: IGRA  ?? Immunizations ordered today: Pfizer vaccine #2    COVID Education:  - Discussed the current COVID pandemic and strategies to avoid infection and what to do if the patient becomes symptomatic.  - Encouraged good hand washing (20 seconds), social distancing, limiting close personal contact - which may include new sexual partners or having multiple partners  during this period.   - Discussed isolating at home but also ways to limit social isolation, such as continuing engagement with people either electronically or with safe distancing, and the ability to go outdoors alone or separated from others  - If the patient becomes ill with fever, respiratory illness, sudden loss of taste and smell, GI complaints - contact clinic for further instructions.  - Reasons to visit the emergency department include SOB, confusion, lightheadedness when standing.   - Needs second CoVid vaccine today    I personally spent 45 minutes face-to-face and non-face-to-face in the care of this patient, which includes all pre, intra, and post visit time on the date of service.    Disposition  Return to clinic 4-6 weeks or sooner if needed.    Next visit:  ?? Needs release for HIV care, [facility] in [facility], FNP-BC  [facility] Infectious Diseases [facility] at [facility]  [address], [place], [place] [address]    Phone: [phone]   Fax: [phone]        Subjective:      Chief Complaint   OPAT and HIV followup    HPI  Return patient visit for Warren Som., a 44 y.o. male.     HIV:  ?? Denies missed doses.  ?? Now feels comfortable getting medications delivered to home  ?? Denies any side effects from medications.  ?? Relationship with girlfriend stable. He would like to get his own place. Bought a car but it is not reliable.  Denies any fever, chills, nausea, vomiting, rash, urinary complaints, diarrhea, constipation.    Valve replacement:   ?? Surgery went well, has been doing well  ?? Denies SOB, chest pain    Right hip replacement through [facility]  ?? Girlfriend had fallen and he tried to lift her up but ended up falling back instead. He broke his hip and required surgery. Post op 2 weeks. Ambulating with a walker.  ?? Advised patient to follow up with [facility] about further surgical care. May need PT?    Genital warts:  ?? See A/P.      Past Medical History:   Diagnosis Date   ??? Bicuspid aortic valve     mod-severe AI   ??? Endocarditis 02/2018    S. Pneumoniae, Tx Vancomycin and Ceftriaxone x 2 weeks, then Ceftriaxone 2g daily x 4 weeks   ??? 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 05/26/19 [name] 12g continuous infusion+Rifampin 300mg  Q8H, 06/25/19 Change to Cefazolin 2g Q8H+Rifampin, TTE 5/4 showed Paravalvular echo lucency measuring approximately 2cm wide as previously seen on TEE. It extends 180 degrees along the right border of the homograft. Appears to be dehiscence and rocking of the aortic homograft.   ??? HIV disease (CMS-HCC) 2008    Dx. in [place]. Denies OI. History of nonadherence to ART. Last on Symtuza. Started on Dolutegravir 50mg  BID and Truvada daily.   ??? Myocardial infarction (CMS-HCC)     Feb 2020   ??? Stroke (CMS-HCC)        Medications and Allergies   Reviewed and updated today. See bottom of this visit's encounter summary for details.  Current Outpatient Medications on File Prior to Visit   Medication Sig   ??? enoxaparin (LOVENOX) 100 mg/mL injection Inject 0.9 mL (90 mg total) under the skin Two (2) times a day.   ??? metoprolol tartrate (LOPRESSOR) 25  MG tablet Take 25 mg by mouth every twelve (12) hours.   ??? oxyCODONE-acetaminophen (PERCOCET) 5-325 mg per tablet Take 1-2 tablets by mouth every eight (8) hours.   ??? potassium chloride (KLOR-CON) 20 MEQ CR tablet Take 20 mEq by mouth daily.   ??? pravastatin (PRAVACHOL) 80 MG tablet Take 1 tablet (80 mg total) by mouth daily.   ??? warfarin (JANTOVEN) 2.5 MG tablet 5mg  (2 tabs) daily or as directed     No current facility-administered medications on file prior to visit.     Previous antibiotics:  Gentamicin  Vancomycin  Ceftriaxone  [name], stopped due to AIN    Current/Prior immunomodulators:  None    Allergies  No Known Allergies    Social History  Social History     Tobacco Use   ??? Smoking status: Current Every Day Smoker     Packs/day: 0.25     Types: Cigarettes   ??? Smokeless tobacco: Never Used   ??? Tobacco comment: 4 cigarettes per day   Substance Use Topics   ??? Alcohol use: Yes     Alcohol/week: 2.0 standard drinks     Types: 2 Cans of beer per week     Comment: daily     Family History  Family History   Problem Relation Age of Onset   ??? Mental illness Mother    ??? No Known Problems Father    ??? No Known Problems Sister    ??? No Known Problems Brother        Review of Systems  As per HPI. Remainder of 10 systems reviewed, negative.        Immunizations:  Immunization History   Administered Date(s) Administered   ??? COVID-19 VACC,MRNA,(PFIZER)(PF)(IM) 08/13/2019       Review of Systems:  10 systems reviewed and negative except as per HPI.       Objective:      BP 118/77 (BP Site: L Arm, BP Position: Sitting, BP Cuff Size: Medium)  - Pulse 93  - Temp 36.2 ??C (97.1 ??F) (Temporal)  - Wt 92.9 kg (204 lb 12.8 oz)  - BMI 27.78 kg/m??   Wt Readings from Last 3 Encounters:   09/18/19 92.9 kg (204 lb 12.8 oz)   08/21/19 92.1 kg (203 lb)   07/10/19 98.4 kg (217 lb)       Const looks well and attentive, alert, appropriate   Eyes sclerae anicteric, noninjected OU   ENT masked   Lymph no cervical or supraclavicular LAD   CV RRR. No murmurs. No rub or gallop. S1/S2. Sharp click with S2 noted from valve replacement.   Lungs CTAB ant/post, normal work of breathing   GI Soft, no organomegaly. NTND. NABS. GU deferred   Rectal Three 1cm clusters of genital warts noted to left buttock and one perianally    Skin no petechiae, ecchymoses or obvious rashes on clothed exam, clean bandage noted to right hip post-op.   MSK no joint tenderness and normal ROM throughout   Neuro grossly intact   Psych Appropriate affect. Eye contact good. Linear thoughts. Fluent speech. In good spirits.     Laboratory Data  Reviewed in Epic today, using Synopsis and Chart Review filters.    Lab Results   Component Value Date    CREATININE 1.33 (H) 07/10/2019    HEPCAB Nonreactive 07/10/2019    CHOL 134 05/25/2019    HDL 29 (L) 05/25/2019    LDL 54 (L) 05/25/2019    NONHDL 105  05/25/2019    TRIG 256 (H) 05/25/2019    A1C <4.0 (L) 07/10/2019     Laboratory Data  Reviewed in Epic today, using Synopsis and Chart Review filters.    Lab Results   Component Value Date    CREATININE 1.33 (H) 07/10/2019    HEPCAB Nonreactive 07/10/2019    CHOL 134 05/25/2019    HDL 29 (L) 05/25/2019    LDL 54 (L) 05/25/2019    NONHDL 105 05/25/2019    TRIG 256 (H) 05/25/2019    A1C <4.0 (L) 07/10/2019     Lab Results   Component Value Date    WBC 5.5 09/18/2019    NEUTROABS 2.9 09/18/2019    LYMPHSABS 1.9 09/18/2019    EOSABS 0.2 09/18/2019    HGB 12.1 (L) 09/18/2019    HCT 38.4 09/18/2019    PLT 505 (H) 09/18/2019       Lab Results   Component Value Date    NA 135 07/10/2019    K 3.9 07/10/2019    CL 106 07/10/2019    CO2 24.9 07/10/2019    BUN 12 07/10/2019    CREATININE 1.33 (H) 07/10/2019    CALCIUM 9.6 07/10/2019    MG 2.2 06/02/2019       Lab Results   Component Value Date    ALKPHOS 97 06/17/2019    BILITOT 0.6 06/24/2019    PROT 7.7 06/17/2019    ALBUMIN 4.0 06/17/2019    ALT <7 (L) 07/10/2019    AST 14 07/10/2019       No results found for: ESR, CRP      Serologies:  Lab Results   Component Value Date    Hep B S Ab Nonreactive 06/24/2019    Hep B Core Total Ab Nonreactive 06/24/2019    Hepatitis C Ab Nonreactive 07/10/2019       Microbiology:    No results found for: ANACXNo results found for: ANACX    Studies:   05/26/19 Echocardiogram  05/26/19 Pacemaker Evaluation

## 2019-09-18 NOTE — Unmapped (Signed)
COVID Education:  ??? Make sure you perform good hand washing (lasting 20 seconds), continue to social distance and limit close personal contact (which may include new sexual partners or having multiple partners during this period).  ??? Try to isolate at home but please find ways to keep in touch with those close to you, such as meeting up with them electronically or socially distanced, and the ability to go outdoors alone or separated from others  ??? If you become ill with fever, respiratory illness, sudden loss of taste and smell, stomach issues, diarrhea, nausea, vomiting - contact clinic for further instructions.  ??? You should go to the emergency department if you develop systems such as shortness of breath, confusion, lightheadedness when standing, high fever.   ??? Here is some information about HIV and CoVid vaccines: [email or web address]  ??? If you're interested in receiving the CoVid vaccine when you're eligible, here are some resources for you to check and make an appointment:  o Your local health department   o [email or web address] through [facility]  o [email or web address]  o [facility] (if you are an established patient with them)  o [email or web address]    URGENT CARE  Please call ahead to speak with the nursing staff if you are in need of an urgent appointment.       MEDICATIONS  For refills please contact your pharmacy and ask them to electronically send or fax the request to the clinic.   Please bring all medications in original bottles to every appointment.    [employer]MAP (formerly [employer]AP) or [employer] Eligibility (required even if you do not receive medication through [facility])  Please remember to renew your Juanell Fairly eligibility during renewal periods which occur twice a year: January-March and July-September.     The following are needed for each renewal:   - [facility] Identification (if you don't have one, then a bill with your name and address in [place])   - proof of income (award letter, W-2, or last three check stubs)   If you are unable to come in for renewal, let us know if we can mail, fax or e-mail paperwork to you.   HMAP Contact: [phone].     Lab info:  Your most recent CD4 T-cell counts and viral loads are below. Here are a few things to keep in mind when looking at your numbers:  ?? Our goal is to get your virus to be undetectable and keep it undetectable. If the virus is undetectable you are much more likely to stay healthy.  ?? We consider your viral load to be undetectable if it says <40 or if it says Not detected.  ?? For most people, we're checking CD4 counts every other visit (once or twice a year, or sometimes even less).  ?? It's normal for your CD4 count to be different from visit to visit.   ?? You can help by taking your medications at about the same time, every single day. If you're having trouble with taking your medications, it's important to let us know.    Lab Results   Component Value Date    ACD4 264 (L) 06/24/2019    CD4 24 (L) 06/24/2019    HIVCP <40 (H) 05/26/2019    HIVRS Not Detected 06/24/2019        Please note that your laboratory and other results may be visible to you in real time, possibly before they reach your provider. Please allow 48 hours for clinical interpretation of  these results. Importantly, even if a result is flagged as abnormal, it may not be one that impacts your health.    It was nice to have a visit with you today!  Follow-up information:  ??? Good seeing you today!  ??? Make sure you follow up with [facility] about what to do next about your incision dressing and need for follow up!      Provider today:  Varney Daily, FNP-BC      ID CLINIC address:   [facility] Infectious Diseases Clinic at [facility]  [address]  [place], [place] [address]    Contact information:    ?? The ID clinic phone number is [phone]   ?? The ID clinic fax number is [phone]  ?? For urgent issues on nights and weekends: Call the ID Physician on-call through the [facility] Operator at [phone].    Please sign up for My [place] Chart - This is a great way to review your labs and track your appointments    Please try to arrive 30 minutes BEFORE your scheduled appointment time!  This will give you time to fill out any front desk paperwork needed for your visit, and allow you to be seen as close to your scheduled appointment time as possible.

## 2019-09-18 NOTE — Unmapped (Signed)
[facility]   Patient Onboarding/Medication Counseling    Warren [name] is a 44 y.o. male with HIV who I am counseling today on initiation of therapy.  I am speaking to the patient.    Was a Nurse, learning disability used for this call? No    Verified patient's date of birth / HIPAA.    Specialty medication(s) to be sent: Infectious Disease: Biktarvy      Non-specialty medications/supplies to be sent: n/a      Medications not needed at this time: n/a         Biktarvy (bictegravir, emtricitabine, and tenofovir alafenamide)    Medication & Administration     Dosage: Take 1 tablet by mouth daily    Administration: Take without regard to food    Adherence/Missed dose instructions: take missed dose as soon as you remember. If it is close to the time of your next dose, skip the dose and resume with your next scheduled dose.    Goals of Therapy     To suppress viral replication and keep patient's HIV undetectable by lab tests    Side Effects & Monitoring Parameters     Common Side Effects:  ??? Diarrhea  ??? Upset stomach  ??? Headache  ??? Changes in Weight  ??? Changes in mood    The following side effects should be reported to the provider:     ?? If patient experiences: signs of an allergic reaction (rash; hives; itching; red, swollen, blistered, or peeling skin with or without fever; wheezing; tightness in the chest or throat; trouble breathing, swallowing, or talking; unusual hoarseness; or swelling of the mouth, face, lips, tongue, or throat)  ?? signs of kidney problems (unable to pass urine, change in how much urine is passed, blood in the urine, or a big weight gain)  ?? signs of liver problems (dark urine, feeling tired, not hungry, upset stomach or stomach pain, light-colored stools, throwing up, or yellow skin or eyes)  ?? signs of lactic acidosis (fast breathing, fast heartbeat, a heartbeat that does not feel normal, very bad upset stomach or throwing up, feeling very sleepy, shortness of breath, feeling very tired or weak, very bad dizziness, feeling cold, or muscle pain or cramps)  Weight gain: some patients have reported weight gain after starting this medication. The amount of weight can vary.    Monitoring Parameters:  ?? CD4  Count  ?? HIV RNA plasma levels,  ?? Liver function  ?? Total bilirubin  ?? serum creatinine  ?? urine glucose  ?? urine protein (prior to or when initiating therapy and as clinically indicated during therapy);       Drug/Food Interactions     ??? Medication list reviewed in Epic. The patient was instructed to inform the care team before taking any new medications or supplements. No drug interactions identified.   ??? Calcium Salts: May decrease the serum concentration of Biktarvy. If taken with food, Biktarvy can be administered with calcium salts.   ??? Iron Preparations: May decrease the serum concentration of Biktarvy. If taken with food, Biktarvy can be administered with Ferrous sulfate. If taken on an empty stomach, Biktarvy must be taken 2 hours before ferrous sulfate. Avoid other iron salts.    Contraindications, Warnings, & Precautions     ??? Black Box Warning: Severe acute exacertbations of HBV have been reported in patients coinfected with HIV-1 and HBV fllowing discontinuation of therapy  ??? Coadministration with dofetilide, rifampin is contraindicated  ??? Immune reconstitution  syndrome: Patients may develop immune reconstitution syndrome, resulting in the occurrence of an inflammatory response to an indolent or residual opportunistic infection or activation of autoimmune disorders (eg, Graves disease, polymyositis, Guillain-Barr?? syndrome, autoimmune hepatitis)   ??? Lactic acidosis/hepatomegaly  ??? Renal toxicity: patients with preexisting renal impairment and those taking nephrotoxic agents (including NSAIDs) are at increased risk.     Storage, Handling Precautions, & Disposal     ??? Store in the original container at room temperature.   ??? Keep lid tightly closed.   ??? Store in a dry place. Do not store in a bathroom.   ??? Keep all drugs in a safe place. Keep all drugs out of the reach of children and pets.   ??? Throw away unused or expired drugs. Do not flush down a toilet or pour down a drain unless you are told to do so. Check with your pharmacist if you have questions about the best way to throw out drugs. There may be drug take-back programs in your area.        Current Medications (including OTC/herbals), Comorbidities and Allergies     Current Outpatient Medications   Medication Sig Dispense Refill   ??? bictegrav-emtricit-tenofov ala (BIKTARVY) 50-200-25 mg tablet Take 1 tablet by mouth daily. 30 tablet 5   ??? enoxaparin (LOVENOX) 100 mg/mL injection Inject 0.9 mL (90 mg total) under the skin Two (2) times a day. 20 mL 2   ??? metoprolol tartrate (LOPRESSOR) 25 MG tablet Take 25 mg by mouth every twelve (12) hours.     ??? oxyCODONE-acetaminophen (PERCOCET) 5-325 mg per tablet Take 1-2 tablets by mouth every eight (8) hours.     ??? potassium chloride (KLOR-CON) 20 MEQ CR tablet Take 20 mEq by mouth daily.     ??? pravastatin (PRAVACHOL) 80 MG tablet Take 1 tablet (80 mg total) by mouth daily. 90 tablet 3   ??? warfarin (JANTOVEN) 2.5 MG tablet 5mg  (2 tabs) daily or as directed 30 tablet 11     No current facility-administered medications for this visit.       No Known Allergies    Patient Active Problem List   Diagnosis   ??? Acute bacterial endocarditis   ??? History of prosthetic aortic valve   ??? HIV (human immunodeficiency virus infection) (CMS-HCC)   ??? Bacteremia due to Staphylococcus aureus   ??? Tobacco use disorder   ??? AKI (acute kidney injury) (CMS-HCC)   ??? Severe aortic insufficiency   ??? HLD (hyperlipidemia)   ??? CVA (cerebral vascular accident) (CMS-HCC)   ??? Status post mechanical aortic valve replacement   ??? Thrombocytopenia (CMS-HCC)   ??? Other long term (current) drug therapy   ??? Leukocytosis   ??? Hx of repair of aortic root   ??? History of antiretroviral therapy   ??? Complete heart block (CMS-HCC)   ??? Closed displaced fracture of right femoral neck (CMS-HCC)   ??? Aortic valve endocarditis   ??? Acute postoperative pain   ??? Acute blood loss anemia       Reviewed and up to date in Epic.    Appropriateness of Therapy     Is medication and dose appropriate based on diagnosis? Yes    Prescription has been clinically reviewed: Yes    Baseline Quality of Life Assessment      How many days over the past month did your HIV  keep you from your normal activities? For example, brushing your teeth or getting up in the morning. 0  Financial Information     Medication Assistance provided: None Required    Anticipated copay of $0.00 reviewed with patient. Verified delivery address.    Delivery Information     Scheduled delivery date: 09/21/19    Expected start date: 09/21/19    Medication will be delivered via UPS to the prescription address in [facility].  This shipment will not require a signature.      Explained the services we provide at [facility] and that each month we would call to set up refills.  Stressed importance of returning phone calls so that we could ensure they receive their medications in time each month.  Informed patient that we should be setting up refills 7-10 days prior to when they will run out of medication.  A pharmacist will reach out to perform a clinical assessment periodically.  Informed patient that a welcome packet and a drug information handout will be sent.      Patient verbalized understanding of the above information as well as how to contact the pharmacy at [phone] option 4 with any questions/concerns.  The pharmacy is open Monday through Friday 8:30am-4:30pm.  A pharmacist is available 24/7 via pager to answer any clinical questions they may have.    Patient Specific Needs     - Does the patient have any physical, cognitive, or cultural barriers? No    - Patient prefers to have medications discussed with  Patient     - Is the patient or caregiver able to read and understand education materials at a high school level or above? Yes    - Patient's primary language is  English     - Is the patient high risk? No    - Does the patient require a Care Management Plan? No     - Does the patient require physician intervention or other additional services (i.e. nutrition, smoking cessation, social work)? No      Warren [name]  [facility] Specialty Pharmacist

## 2019-09-18 NOTE — Unmapped (Signed)
[facility] Specialty Medication Onboarding    Specialty Medication: Radio producer  Prior Authorization: Not Required   Financial Assistance: No - copay  <$25  Final Copay/Day Supply: $0.00 / 30    Insurance Restrictions: None     Notes to Pharmacist: N/A    The triage team has completed the benefits investigation and has determined that the patient is able to fill this medication at [facility]. Please contact the patient to complete the onboarding or follow up with the prescribing physician as needed.

## 2019-09-18 NOTE — Unmapped (Signed)
Assessment/Plan:     Current warfarin Dose:  Unsure, reports adherence w/ LMWH 90mg  BID  Steady State:no  Date INR: September 18, 2019  Goal INR: 2-3  INR Results: 1.1  Lab Results   Component Value Date    INR, POC 1.1 09/18/2019    INR, POC 1.1 09/11/2019    INR, POC 1.2 08/21/2019       ASSESSMENT/PLAN:  Plan for Warfarin Dose:  increase dose: 5mg  daily + LMWH 90mg  BID    1. INR subtherapeutc, on warfarin 5mg  2-3x/wk and 2.5mg  other days (though cannot nail down a consistent reported dose) + LMWH, continue bridge given h/o CVA.  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  On exam today, also appears to have some external hemorrhoids w/out evidence of active bleeding.  Monitor, and if sxs persistent, will consider w/u  2. R THR - still using walker, follows w/ [facility]    Repeat INR Date:  1 wk    Subject/Objective:     Patient: Warren [name]. 44 y.o.    Medical Record Number:  [id number]    PRIMARY CARE PHYSICIAN: Idelia Salm, MD    XB:[id number] mechanical heart valve, THR    PMHx:   Patient Active Problem List   Diagnosis   ??? Acute bacterial endocarditis   ??? History of prosthetic aortic valve   ??? HIV (human immunodeficiency virus infection) (CMS-HCC)   ??? Bacteremia due to Staphylococcus aureus   ??? Tobacco use disorder   ??? AKI (acute kidney injury) (CMS-HCC)   ??? Severe aortic insufficiency   ??? HLD (hyperlipidemia)   ??? CVA (cerebral vascular accident) (CMS-HCC)   ??? Status post mechanical aortic valve replacement   ??? Thrombocytopenia (CMS-HCC)   ??? Other long term (current) drug therapy   ??? Leukocytosis   ??? Hx of repair of aortic root   ??? History of antiretroviral therapy   ??? Complete heart block (CMS-HCC)   ??? Closed displaced fracture of right femoral neck (CMS-HCC)   ??? Aortic valve endocarditis   ??? Acute postoperative pain   ??? Acute blood loss anemia          Your Medication List          Accurate as of September 18, 2019 11:48 AM. If you have any questions, ask your nurse or doctor.            CHANGE how you take these medications    warfarin 2.5 MG tablet  Commonly known as: JANTOVEN  5mg  (2 tabs) daily or as directed  What changed: additional instructions  Changed by: Dwana Melena, PA        CONTINUE taking these medications    emtricitabine-tenofovir (TDF) 200-300 mg per tablet  Commonly known as: TRUVADA  Take 1 tablet by mouth daily.     enoxaparin 100 mg/mL injection  Commonly known as: LOVENOX  Inject 0.9 mL (90 mg total) under the skin Two (2) times a day.     metoprolol tartrate 25 MG tablet  Commonly known as: LOPRESSOR  Take 25 mg by mouth every twelve (12) hours.     oxyCODONE-acetaminophen 5-325 mg per tablet  Commonly known as: PERCOCET  Take 1-2 tablets by mouth every eight (8) hours.     potassium chloride 20 MEQ CR tablet  Commonly known as: KLOR-CON  Take 20 mEq by mouth daily.     pravastatin 80 MG tablet  Commonly known as:  PRAVACHOL  Take 1 tablet (80 mg total) by mouth daily.     TIVICAY 50 mg TABLET  Generic drug: dolutegravir  Take 1 tablet (50 mg total) by mouth daily.              General:  Well appearing, no acute distress    Signs/Symptoms bleeding or bruising: yes, BRBPR x 2 wks    Signs/Symptoms of Potential Embolic Events: no    Changes In:   Diet:no   Medications:no   Activities / Lifestyle:no   Health Status:no   Nonadherent: no       Current EtOH Use:no   Current Smoker/tobacco user: no    Medications reviewed including OTC and herbals:yes    The patient set a personal goal for anticoagulation management today:Take warfarin daily as recommended.    Medication adherence and barriers to the treatment plan have been addressed. Opportunities to optimize healthy behaviors have been discussed. Patient / caregiver voiced understanding.    Additional History:

## 2019-09-18 NOTE — Unmapped (Unsigned)
Medical Case Management Social Work Note     Duration of Intervention: 10 min     REASON/TYPE OF CONTACT: Face to Face, Patient came to clinic for ID clinic appointment follow up    ASSESSMENT: Patient reports that he had been doing well and that he had figured out transportation to his appointments.  Patient reports following up with hip replacement and starting physical therapy next week. Patient has a bandage on his hip and said he would call the doctor about next steps on taking care of the bandage.     ADHERENCE: Did not discuss at this interaction due to competing priorities and/or no concerns.    INTERVENTION: SW checked in for overall well being.     PLAN:  Patient will reach out as needed and SW will continue to support with medical follow up.     Wendolyn Raso LCSW, LCAS  ID Clinic Social Work   Direct: [phone]

## 2019-09-21 LAB — LYMPH MARKER LIMITED,FLOW
ABSOLUTE CD3 CNT: 1254 {cells}/uL (ref 915–3400)
ABSOLUTE CD4 CNT: 399 {cells}/uL — ABNORMAL LOW (ref 510–2320)
ABSOLUTE CD8 CNT: 817 {cells}/uL (ref 180–1520)
CD3% (T CELLS)": 66 % (ref 61–86)
CD4% (T HELPER)": 21 % — ABNORMAL LOW (ref 34–58)
CD4:CD8 RATIO: 0.5 — ABNORMAL LOW (ref 0.9–4.8)
CD8% T SUPPRESR": 43 % — ABNORMAL HIGH (ref 12–38)

## 2019-09-22 LAB — HIV RNA, QUANTITATIVE, PCR

## 2019-09-22 LAB — HIV RNA LOG(10): Lab: 0

## 2019-09-24 ENCOUNTER — Ambulatory Visit: Payer: Medicaid Other

## 2019-09-25 ENCOUNTER — Encounter: Admit: 2019-09-25 | Payer: MEDICAID

## 2019-09-25 ENCOUNTER — Ambulatory Visit: Admit: 2019-09-25 | Payer: MEDICAID

## 2019-10-01 ENCOUNTER — Ambulatory Visit: Admit: 2019-10-01 | Discharge: 2019-10-02 | Payer: MEDICAID

## 2019-10-01 ENCOUNTER — Ambulatory Visit: Payer: Medicaid Other | Admitting: Physical Therapy

## 2019-10-01 DIAGNOSIS — B20 Human immunodeficiency virus [HIV] disease: Principal | ICD-10-CM

## 2019-10-01 DIAGNOSIS — A630 Anogenital (venereal) warts: Principal | ICD-10-CM

## 2019-10-01 NOTE — Unmapped (Addendum)
TC to patient to schedule PAT evaluation.    10/08/19 @ 9am    On the day of your appointment:    Please arrive at least 15 minutes prior to your appointment  Please eat and drink as normal  Please bring all your medications     We are located at [facility]:      [facility] Pre-Procedure Services at [facility]  [name]   [place], [place] [address]    [phone]

## 2019-10-01 NOTE — Unmapped (Signed)
Patient escorted to clinic procedure room and positioned in prone position.  Assisted with clinic procedure, anoscopy done in clinic today.  Pt tolerated procedure well.      Surgical consent obtained and witnessed, scanned into medical record.  Surgical procedure to be scheduled after anesthesia evaluation.

## 2019-10-01 NOTE — Unmapped (Addendum)
Dr.Stem would like you be evaluated by our anesthesia department.  They will contact you to schedule appt. They are located @ [address] [place], [place]  Tel# [phone].    We will call you after that evaluation is complete and plan for surgery.    For any questions about your visit you may contact  Marrianne Mood RN [phone]  Everlene Balls RN [phone]    GI Surgery nurses for Dr. Lind Covert

## 2019-10-01 NOTE — Unmapped (Signed)
New Patient Clinic History and Physical    Date: 10/01/2019  Chief Complaint: anal condyloma  History of Present Illness:   Warren [name]. is a 44 y.o. 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  Prior to this summer he had received all of his care in [place].      He has a complex medical history consisting of HIV originally diagnosed in the mid 2010s per patient report.  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  This was complicated by embolic CVA.  In May of this year he presented to [facility] with MSSA prosthetic aortic valve endocarditis.  He was treated with IV antibiotics and ultimately underwent underwent re-do aortic root replacement, and AVR with mechanical valve in June of 2021.  Since that time he has been on coumadin for anticoagulation.      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  He has undergone several attempts at therapy in [place].  He states he has been treated with several topical treatments (Condylox per ID note, potentially Aldara) as well as cryotherapy.  He has not undergone fulguration.  Overall they cause him minor distress as he occasionally notes blood during wiping and has some pruritus.  Otherwise he has no issues.  He is currently being seen by the infectious disease team at [facility] and is currently on Bictarvy.  His last RNA quant was not detectable and his CD4 count is 399.  He otherwise feels in his normal state of health.       Past Medical History:   Diagnosis Date   ??? Bicuspid aortic valve     mod-severe AI   ??? Endocarditis 02/2018    S. Pneumoniae, Tx Vancomycin and Ceftriaxone x 2 weeks, then Ceftriaxone 2g daily x 4 weeks   ??? 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 05/26/19 [name] 12g continuous infusion+Rifampin 300mg  Q8H, 06/25/19 Change to Cefazolin 2g Q8H+Rifampin, TTE 5/4 showed Paravalvular echo lucency measuring approximately 2cm wide as previously seen on TEE. It extends 180 degrees along the right border of the homograft. Appears to be dehiscence and rocking of the aortic homograft.   ??? HIV disease (CMS-HCC) 2008    Dx. in [place]. Denies OI. History of nonadherence to ART. Last on Symtuza. Started on Dolutegravir 50mg  BID and Truvada daily.   ??? Myocardial infarction (CMS-HCC)     Feb 2020   ??? Stroke (CMS-HCC)        Past Surgical History:   Procedure Laterality Date   ??? CARDIAC PACEMAKER PLACEMENT  03/20/2018    Epicardial pacemaker placed for bradycardia    ??? CARDIAC SURGERY  02/2018   ??? CARDIAC VALVE REPLACEMENT  02/2018    S/p aortic root replacement with homograft 25mm, reconstruction of aortomitral continuity, repair of right atrial fistula, mitral valve repair        No Known Allergies    Current Outpatient Medications   Medication Sig Dispense Refill   ??? bictegrav-emtricit-tenofov ala (BIKTARVY) 50-200-25 mg tablet Take 1 tablet by mouth daily. 30 tablet 5   ??? enoxaparin (LOVENOX) 100 mg/mL injection Inject 0.9 mL (90 mg total) under the skin Two (2) times a  day. 20 mL 2   ??? metoprolol tartrate (LOPRESSOR) 25 MG tablet Take 25 mg by mouth every twelve (12) hours.     ??? oxyCODONE-acetaminophen (PERCOCET) 5-325 mg per tablet Take 1-2 tablets by mouth every eight (8) hours.     ??? potassium chloride (KLOR-CON) 20 MEQ CR tablet Take 20 mEq by mouth daily.     ??? pravastatin (PRAVACHOL) 80 MG tablet Take 1 tablet (80 mg total) by mouth daily. 90 tablet 3   ??? warfarin (JANTOVEN) 2.5 MG tablet 5mg  (2 tabs) daily or as directed 30 tablet 11     No current facility-administered medications for this visit.       Family History   Problem Relation Age of Onset   ??? Mental illness Mother    ??? No Known Problems Father    ??? No Known Problems Sister    ??? No Known Problems Brother        Social History     Tobacco Use   ??? Smoking status: Current Every Day Smoker     Packs/day: 0.25     Types: Cigarettes   ??? Smokeless tobacco: Never Used   ??? Tobacco comment: 4 cigarettes per day   Vaping Use   ??? Vaping Use: Never used   Substance Use Topics   ??? Alcohol use: Yes     Alcohol/week: 2.0 standard drinks     Types: 2 Cans of beer per week     Comment: daily   ??? Drug use: Not Currently     Types: Marijuana     Comment: Rare use       Review of Systems:   General: No fevers, weight loss, fatigue   HEENT: No visual changes, hearing changes  CV: No chest pain  Resp: No shortness of breath  GI: No nausea, vomiting, diarrhea, constipation  GU: No dysuria, hematuria   MSK: No muscle aches, arthritis  Neuro: No numbness, tingling   Psych: No depression, anxiety    Physical Exam:  BP 130/83  - Pulse 91  - Ht 182.9 cm (6')  - Wt 94.8 kg (209 lb)  - BMI 28.35 kg/m??    General: no acute distress, healthy appearing, appears stated age  HEENT: no gross abnormalities, pupils equal, dentition in tact  CV: audible murmur, regular rate, well healing incisions from cardiac surgery  Resp: non-labored respirations, equal chest rise bilaterally  Abd: soft, non-tender  Extremities: no swelling, range of motion in tact  Anal exam: moderate external condyloma, anoscopy and DRE poorly tolerated, condyloma however noted in anal canal, particularly in posterior position.    Assessment/Plan:  Warren [name]. is a 44 y.o. 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  As he has failed attempts at management with topical therapy and cryotherapy at this point I would recommend he undergo surgical treatment.  I discussed with Warren [name] that we would plan an exam under anesthesia with biopsies and fulguration of his condyloma.  As he has a quite complex recent cardiac history and is on coumadin as well as pacemaker dependent we will ask that he is seen in anesthesia clinic preoperatively.  I would plan to proceed with the operation under MAC anesthesia with the addition of local anesthesia in order to avoid general anesthesia. This operation will necessitate use of electrocautery.  Additionally, as we would only be planning fulguration and biopsy we would plan to perform this without necessitating that he  stop his warfarin pre-operatively.The procedure was discussed with the patient in clinic as well as the alternatives to proceeding with surgery.  We discussed the risks of surgery, including post-operative bleeding and pain.  The patient would like to proceed with operative intervention and was consented in clinic.    Lastly, it was discussed with the patient that he will need to follow with his infectious disease team post-operatively for surveillance of his anal canal.    Estelle June, MD   Colorectal Surgery

## 2019-10-01 NOTE — Unmapped (Signed)
Clinic note completed, entered in error, please disregard.

## 2019-10-03 ENCOUNTER — Ambulatory Visit: Payer: Medicaid Other | Attending: Orthopaedic Surgery

## 2019-10-03 ENCOUNTER — Other Ambulatory Visit: Payer: Self-pay

## 2019-10-03 DIAGNOSIS — S72001A Fracture of unspecified part of neck of right femur, initial encounter for closed fracture: Secondary | ICD-10-CM | POA: Insufficient documentation

## 2019-10-03 DIAGNOSIS — M6281 Muscle weakness (generalized): Secondary | ICD-10-CM | POA: Insufficient documentation

## 2019-10-03 DIAGNOSIS — M25551 Pain in right hip: Secondary | ICD-10-CM | POA: Diagnosis present

## 2019-10-03 DIAGNOSIS — R262 Difficulty in walking, not elsewhere classified: Secondary | ICD-10-CM

## 2019-10-03 DIAGNOSIS — Z96641 Presence of right artificial hip joint: Secondary | ICD-10-CM | POA: Diagnosis present

## 2019-10-04 NOTE — Therapy (Signed)
[facility] [address] [place], [place], [address] Phone: [phone]   Fax:  [phone]  Physical Therapy Evaluation  Patient Details  Name: James MinorsCornelius Ferrell MRN: [id number] Date of Birth: 02/26/1975 Referring Provider (PT): Tarry KosXu, Naiping M, MD   Encounter Date: 10/03/2019   PT End of Session - 10/04/19 2140    Visit Number 1    Number of Visits 4    Authorization Type [place] MEDICAID [facility]    Authorization - Visit Number 0    Authorization - Number of Visits 3    Progress Note Due on Visit 10    PT Start Time 1033    PT Stop Time 1115    PT Time Calculation (min) 42 min    Activity Tolerance Patient tolerated treatment well    Behavior During Therapy [facility] for tasks assessed/performed           Past Medical History:  Diagnosis Date  . CHB (complete heart block) (HCC) 2020  . 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 H/O mechanical aortic valve replacement   . HIV (human immunodeficiency virus infection) (HCC)   . Stroke [facility](HCC)     Past Surgical History:  Procedure Laterality Date  . 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/21/2019   With root replacement  . BENTALL PROCEDURE  07/21/2019  . CARDIAC VALVE SURGERY    . MECHANICAL AORTIC VALVE REPLACEMENT  07/21/2019   Redo of valve surgery  . PACEMAKER IMPLANT    . TEE WITHOUT CARDIOVERSION N/A 05/13/2019   Procedure: TRANSESOPHAGEAL ECHOCARDIOGRAM (TEE);  Surgeon: Laurier NancyKhan, Shaukat A, MD;  Location: [facility] ORS;  Service: Cardiovascular;  Laterality: N/A;  . TOTAL HIP ARTHROPLASTY Right 08/30/2019   Procedure: TOTAL HIP ARTHROPLASTY ANTERIOR APPROACH;  Surgeon: Tarry KosXu, Naiping M, MD;  Location: [facility] OR;  Service: Orthopedics;  Laterality: Right;    There were no vitals filed for this visit.        [facility] - 10/04/19 0001      Assessment   Medical Diagnosis Closed fracture of neck of right femur; R THA    Referring Provider (PT) Tarry KosXu, Naiping M, MD     Onset Date/Surgical Date 08/30/19    Hand Dominance Right    Prior Therapy in the hospital      Precautions   Precautions Anterior Hip      Restrictions   Weight Bearing Restrictions Yes      Balance Screen   Has the patient fallen in the past 6 months Yes    How many times? 1    Has the patient had a decrease in activity level because of a fear of falling?  No    Is the patient reluctant to leave their home because of a fear of falling?  No      Home Environment   Living Environment Private residence    Living Arrangements Spouse/significant other    Type of Home House    Home Access Stairs to enter    Entrance Stairs-Number of Steps 2    Entrance Stairs-Rails Right;Left    Home Layout One level      Prior Function   Level of Independence Independent      Observation/Other Assessments   Skin Integrity Incision closed by staples and covered by aquacel dressing. No signs of redness    Focus on Therapeutic Outcomes (FOTO)  NA      Observation/Other Assessments-Edema    Edema --  Increased swelling proximal R hip     Sensation   Light Touch Appears Intact      ROM / Strength   AROM / PROM / Strength AROM;Strength      AROM   Overall AROM Comments R hip AROM is WNLs      Strength   Strength Assessment Site Hip    Right/Left Hip Right    Right Hip Flexion 3-/5    Right Hip Extension 3-/5    Right Hip External Rotation  3-/5    Right Hip Internal Rotation 3/5    Right Hip ABduction 3-/5    Right Hip ADduction 3/5      Transfers   Transfers Sit to Stand;Stand to Sit    Sit to Stand 7: Independent      Ambulation/Gait   Ambulation/Gait Yes      Standardized Balance Assessment   Standardized Balance Assessment Timed Up and Go Test      Timed Up and Go Test   Normal TUG (seconds) 9.8                      Objective measurements completed on examination: See above findings.       [facility] Adult PT Treatment/Exercise - 10/04/19 0001       Ambulation/Gait   Gait Pattern Step-through pattern   Decreased pace; hip weakess     Exercises   Exercises Knee/Hip      Knee/Hip Exercises: Supine   Quad Sets Strengthening;Right;10 reps    Short Arc Quad Sets Strengthening;Right;10 reps    Bridges Strengthening;Both;10 reps    Straight Leg Raises Strengthening;Right;10 reps    Other Supine Knee/Hip Exercises hip abd/add 10x                  PT Education - 10/04/19 2137    Education Details Eval findings, POC, HEP, anterior THA precautions, to contact Dr. Roda Shutters office to set up an appt to have the staples removed    Person(s) Educated Patient    Methods Explanation;Demonstration;Tactile cues;Verbal cues;Handout    Comprehension Verbalized understanding;Returned demonstration;Verbal cues required;Tactile cues required;Need further instruction            PT Short Term Goals - 10/04/19 2151      PT SHORT TERM GOAL #1   Title Pt will be Ind in an initial HEP    Time 3    Period Weeks    Status New    Target Date 10/25/19             PT Long Term Goals - 10/04/19 2207      PT LONG TERM GOAL #1   Title Pt will be Ind in a final HEP    Time 9    Period Weeks    Status New    Target Date 12/06/19      PT LONG TERM GOAL #2   Title Increase R hip strength to 4/5 to improve R hip stability and functional mobility    Time 9    Period Weeks    Status New    Target Date 12/06/19      PT LONG TERM GOAL #3   Title Improved TUG time to 8.5 as a measure of increased hip strengh and quality of gait.    Baseline 9.8 sec s AD    Time 9    Period Weeks    Status New    Target Date 12/06/19  PT LONG TERM GOAL #4   Title Pt will be ind c dsc/asc steps c reciprocal gait pattern c or s handrail    Time 9    Period Weeks    Status New    Target Date 12/06/19                  Plan - 10/04/19 2142    Clinical Impression Statement Pt presents 5 weeks s/p a R THA, anterior approach. Pt has not had a return  appt with the surgeon and the staples have not yet been removed. There is no redness noted of the R hip. Pt reports he has been walking s an AD for 2 weeks. The R hip is weak and pt is not able to complete a full SLR. Weakness of the R hip is noted with walking. Pt will benefit from PT 1w8 to increase strength to improve hip stability and gait pattern.    Personal Factors and Comorbidities Comorbidity 3+    Comorbidities HIV, CHB, endocarditis of aortic valve, mechanical aortic heart valve replacement, stroke    Examination-Activity Limitations Carry;Lift;Stand;Squat;Locomotion Level    Stability/Clinical Decision Making Evolving/Moderate complexity    Clinical Decision Making Moderate    Rehab Potential Good    PT Frequency 1x / week    PT Duration 8 weeks    PT Treatment/Interventions ADLs/Self Care Home Management;Iontophoresis 4mg /ml Dexamethasone;Moist Heat;Balance training;Therapeutic exercise;Therapeutic activities;Functional mobility training;Stair training;Gait training;Patient/family education;Manual techniques;Dry needling;Taping;Vasopneumatic Device;Neuromuscular re-education    PT Next Visit Plan Assess respnse to HEP; progress strengthening exs as tolerated    PT Home Exercise Plan [id number]    Consulted and Agree with Plan of Care Patient           Patient will benefit from skilled therapeutic intervention in order to improve the following deficits and impairments:  Difficulty walking, Decreased endurance, Decreased activity tolerance, Pain, Decreased strength, Increased edema, Decreased mobility, Decreased balance  Visit Diagnosis: Hip fx, right, closed, initial encounter (HCC)  History of total hip arthroplasty, right  Muscle weakness (generalized)  Pain in right hip  Difficulty in walking, not elsewhere classified     Problem List Patient Active Problem List   Diagnosis Date Noted  . Status post mechanical aortic valve replacement 08/30/2019  . Closed displaced  fracture of right femoral neck (HCC) 08/30/2019  . HIV (human immunodeficiency virus infection) (HCC) 05/20/2019  . Bacteremia due to Staphylococcus aureus 05/10/2019  . Sepsis (HCC) 05/09/2019  . Leukocytosis 05/09/2019  . Hypotension 05/09/2019  . AKI (acute kidney injury) (HCC) 05/09/2019    05/11/2019 MS, PT 10/04/19 10:20 PM  [facility] [address] [place], [place], [place] Phone: [phone]   Fax:  [phone]  Name: James Ferrell MRN: James Ferrell Date of Birth: [birth year]   Check all possible CPT codes:      [x]  97110 (Therapeutic Exercise)  []  92507 (SLP Treatment)  [x]  97112 (Neuro Re-ed)   []  92526 (Swallowing Treatment)   [x]  97116 (Gait Training)   []  [phone] (Cognitive Training, 1st 15 minutes) [x]  97140 (Manual Therapy)   []  97130 (Cognitive Training, each add'l 15 minutes)  [x]  97530 (Therapeutic Activities)  []  Other, List CPT Code ____________    [x]  97535 (Self Care)       []  All codes above (97110 - 97535)  []  97012 (Mechanical Traction)  [x]  97014 (E-stim Unattended)  [x]  97032 (E-stim manual)  [x]  97033 (Ionto)  []  97035 (Ultrasound)  [x]  97016 (Vaso)  []   35361 Furniture conservator/restorer) []  (Prosthetic Training) []  [id number] (Physical Performance Training) []  (Aquatic Therapy) []  [id number] (Canalith Repositioning) []  (Contrast Bath) []  [id number] (Paraffin) []  97597 (Wound Care 1st 20 sq cm) []  97598 (Wound Care each add'l 20 sq cm)

## 2019-10-08 ENCOUNTER — Ambulatory Visit: Admit: 2019-10-08 | Discharge: 2019-10-08 | Payer: MEDICAID

## 2019-10-08 DIAGNOSIS — Z01818 Encounter for other preprocedural examination: Principal | ICD-10-CM

## 2019-10-08 DIAGNOSIS — B20 Human immunodeficiency virus [HIV] disease: Principal | ICD-10-CM

## 2019-10-08 DIAGNOSIS — E785 Hyperlipidemia, unspecified: Principal | ICD-10-CM

## 2019-10-08 DIAGNOSIS — Z952 Presence of prosthetic heart valve: Principal | ICD-10-CM

## 2019-10-08 DIAGNOSIS — I442 Atrioventricular block, complete: Principal | ICD-10-CM

## 2019-10-08 DIAGNOSIS — Z950 Presence of cardiac pacemaker: Principal | ICD-10-CM

## 2019-10-08 NOTE — Unmapped (Unsigned)
Duration of Intervention: 5 min     REASON FOR DISCHARGE:  Completed care plan goals, Patient has learned how to access transportation services through [facility] and has been connected to resources to help improve overall well being and health care.     EFFECTIVE DATE:  10/08/2019    OUTSIDE REFERRALS MADE: Yes, describe: Patient continues to need ongoing support with medical needs and medical care coordination.  Patient will be transferred care to nurse, Francisca December, for ongoing check ins and care.       Penda Venturi LCSW, LCAS  [facility] Social Work   Direct: [phone]

## 2019-10-08 NOTE — Unmapped (Signed)
RN contacted patient to initiate [facility] nursing. Pt. Was in a bad service area at the time of the phone call and will call back. RN spoke with social worker Anson [place], who agreed to discharge patient from SW [facility], because the patient has more medical needs at this time.

## 2019-10-08 NOTE — Unmapped (Addendum)
Per Anesthesia's guidelines:    Please take the following medications the morning of your procedure with a sip of water:    Biktarvy    Advised to contact prescribing provider regarding perioperative management of Coumadin / Lovenox.

## 2019-10-08 NOTE — Unmapped (Addendum)
*   YES below indicates need present in that domain. Briefly note what each  yes means. *    MEDICAL NEEDS:  1. HIV Medical Needs: {YES /NO:25183}   2. HIV Medication Adherence: {YES /NO:25183}- Yes, adherent to Biktarvy. Doesn't take it every day at the exact same time, but does take it within 1-3 hours of it  3. HIV Medical Care (engagement in care): {YES /NO:25183}   4. Non-HIV Medical Needs: {YES /NO:25183}- Yes, not been able to get blood levels to where they need- Warfarin 2 pills per night/. INR- 1.1, needs to be 2.0 or above      MH/SA NEEDS:  5. Mental Health: {YES /NO:25183}   6. Drug/Alcohol Use: {YES /NO:25183}      FISCAL NEEDS:  7. Financial Situation: {YES /NO:25183}    8. Benefits/Insurance/Pharmacy Assistance: {YES [id number]      BASIC NEEDS:  9. Housing: {YES /NO:25183}    10. Transportation: {YES /NO:25183}   11. Social Support System: {YES /OZ:30865}    12. Food/Nutrition, Clothing, Utilities: {YES D1105862    PREVENTION NEEDS:  13. Risk Reduction Counseling Needs: {YES /NO:25183}    14. Disclosure to Partners: {YES D1105862      OTHER NEEDS: (Optional Section, add more items as needed)  15. Activities of Daily Living: {YES /NO:25183}    16. Medication Side Effects Management: {YES /NO:25183}   17. Non-HIV Medication Adherence: {YES /NO:25183}   18. Knowledge of Medical Aspects of HIV: {YES /NO:25183}    19. Provider Relationships: {YES /NO:25183}  20. Family Functioning: {YES /NO:25183}    21. Domestic Violence: {YES /NO:25183}    22. Other Competing Demands: {YES /NO:25183}   23. Criminal Justice System Involvement: {YES /NO:25183}  24. Communication Skills (Health Literacy): {YES D1105862

## 2019-10-09 ENCOUNTER — Inpatient Hospital Stay: Payer: Medicaid Other | Admitting: Orthopaedic Surgery

## 2019-10-14 ENCOUNTER — Telehealth: Payer: Self-pay | Admitting: Physical Therapy

## 2019-10-14 ENCOUNTER — Ambulatory Visit: Payer: Medicaid Other | Admitting: Physical Therapy

## 2019-10-14 NOTE — Telephone Encounter (Signed)
Contacted patient regarding missed PT appointment. Patient stated he forgot about the appointment and declined to reschedule. He was reminded of his next scheduled PT appointment on 10/21/2019 and of the attendance policy. Patient expressed understanding.   Rosana Hoes, PT, DPT, LAT, ATC 10/14/19  1:12 PM Phone: [phone] Fax: [phone]

## 2019-10-15 ENCOUNTER — Encounter: Payer: Self-pay | Admitting: Orthopaedic Surgery

## 2019-10-15 ENCOUNTER — Ambulatory Visit: Payer: Self-pay

## 2019-10-15 ENCOUNTER — Ambulatory Visit (INDEPENDENT_AMBULATORY_CARE_PROVIDER_SITE_OTHER): Payer: Medicaid Other | Admitting: Orthopaedic Surgery

## 2019-10-15 VITALS — Ht 71.0 in | Wt 184.0 lb

## 2019-10-15 DIAGNOSIS — Z96641 Presence of right artificial hip joint: Secondary | ICD-10-CM

## 2019-10-15 MED ORDER — HYDROCODONE-ACETAMINOPHEN 5-325 MG PO TABS: 1.0000 | ORAL_TABLET | Freq: Every day | ORAL | 0 refills | Status: AC | PRN

## 2019-10-15 NOTE — Unmapped (Signed)
PROMIS Tablet Screening  Completed Date: 09/18/2019     SW reviewed self-administered screening.    Patient had a PHQ-9 score of 0  indicating None-Minimal depression.   Pt Not given option of SI question due to virtual PRO given  Pt scored 8 on AUDIT/AUDIT-C indicating At-risk drinking   Pt  did not answer question concerning  substance use in past 3 months.   Pt did not answer question concerning  concerns for IPV.    Next scheduled ID visit: 10/16/2019      Luisa Dago, Theresia Majors, CCM

## 2019-10-15 NOTE — Progress Notes (Signed)
   Post-Op Visit Note   Patient: James Ferrell           Date of Birth: 10-Mar-1975           MRN: [id number] Visit Date: 10/15/2019 PCP: Patient, No Pcp Per   Assessment & Plan:  Chief Complaint:  Chief Complaint  Patient presents with  . Right Hip - Follow-up    Right total hip arthroplasty 8/8/20221   Visit Diagnoses:  1. History of total hip replacement, right     Plan: Patient is a pleasant 44 year old gentleman who comes in today 6 weeks out Right total hip replacement from a femoral neck fracture 08/30/2019.  This is his first postop visit.  He has been doing okay.  Minimal pain.  He has been in physical therapy.  He is ambulating without assistance.  Examination of his right hip reveals a fully healed surgical scar without complication.  He still has staples intact.  Calves are soft and nontender.  He is neurovascular intact distally.  At this point, staples were removed and Steri-Strips applied.  Dental prophylaxis reinforced.  Follow-up with [place] in 6 weeks time for repeat evaluation and AP pelvis x-rays.  Call with concerns or questions.  Follow-Up Instructions: Return in about 6 weeks (around 11/26/2019).   Orders:  Orders Placed This Encounter  Procedures  . XR Pelvis 1-2 Views   No orders of the defined types were placed in this encounter.   Imaging: XR Pelvis 1-2 Views  Result Date: 10/15/2019 Well-seated prosthesis without complication   PMFS History: Patient Active Problem List   Diagnosis Date Noted  . Status post mechanical aortic valve replacement 08/30/2019  . Closed displaced fracture of right femoral neck (HCC) 08/30/2019  . HIV (human immunodeficiency virus infection) (HCC) 05/20/2019  . Bacteremia due to Staphylococcus aureus 05/10/2019  . Sepsis (HCC) 05/09/2019  . Leukocytosis 05/09/2019  . Hypotension 05/09/2019  . AKI (acute kidney injury) (HCC) 05/09/2019   Past Medical History:  Diagnosis Date  . CHB (complete heart block) (HCC) 2020  .  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 H/O mechanical aortic valve replacement   . HIV (human immunodeficiency virus infection) (HCC)   . Stroke [facility])     Family History  Problem Relation Age of Onset  . Heart disease Neg Hx     Past Surgical History:  Procedure Laterality Date  . 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/21/2019   With root replacement  . BENTALL PROCEDURE  07/21/2019  . CARDIAC VALVE SURGERY    . MECHANICAL AORTIC VALVE REPLACEMENT  07/21/2019   Redo of valve surgery  . PACEMAKER IMPLANT    . TEE WITHOUT CARDIOVERSION N/A 05/13/2019   Procedure: TRANSESOPHAGEAL ECHOCARDIOGRAM (TEE);  Surgeon: Laurier Nancy, MD;  Location: [facility] ORS;  Service: Cardiovascular;  Laterality: N/A;  . TOTAL HIP ARTHROPLASTY Right 08/30/2019   Procedure: TOTAL HIP ARTHROPLASTY ANTERIOR APPROACH;  Surgeon: Tarry Kos, MD;  Location: [facility] OR;  Service: Orthopedics;  Laterality: Right;   Social History   Occupational History  . Not on file  Tobacco Use  . Smoking status: Current Every Day Smoker  . Smokeless tobacco: Never Used  Vaping Use  . Vaping Use: Never used  Substance and Sexual Activity  . Alcohol use: Yes  . Drug use: Not on file  . Sexual activity: Not on file

## 2019-10-17 NOTE — Unmapped (Signed)
[facility] Shared [facility] Clinical Assessment & Refill Coordination Note    Jakory Matsuo., DOB: [birth year]  Phone: [phone] (home)     All above HIPAA information was verified with patient.     Was a Nurse, learning disability used for this call? No    Specialty Medication(s):   Infectious Disease: Biktarvy     Current Outpatient Medications   Medication Sig Dispense Refill   ??? bictegrav-emtricit-tenofov ala (BIKTARVY) 50-200-25 mg tablet Take 1 tablet by mouth daily. 30 tablet 5   ??? enoxaparin (LOVENOX) 100 mg/mL injection Inject 0.9 mL (90 mg total) under the skin Two (2) times a day. 20 mL 2   ??? metoprolol tartrate (LOPRESSOR) 25 MG tablet Take 25 mg by mouth every twelve (12) hours. (Patient not taking: Reported on 10/08/2019)     ??? oxyCODONE-acetaminophen (PERCOCET) 5-325 mg per tablet Take 1-2 tablets by mouth every eight (8) hours.     ??? potassium chloride (KLOR-CON) 20 MEQ CR tablet Take 20 mEq by mouth daily.     ??? pravastatin (PRAVACHOL) 80 MG tablet Take 1 tablet (80 mg total) by mouth daily. (Patient not taking: Reported on 10/08/2019) 90 tablet 3   ??? warfarin (JANTOVEN) 2.5 MG tablet 5mg  (2 tabs) daily or as directed 30 tablet 11     No current facility-administered medications for this visit.        Changes to medications: Kennis reports no changes at this time.    No Known Allergies    Changes to allergies: No    SPECIALTY MEDICATION ADHERENCE     Biktarvy   : 4 days of medicine on hand     Medication Adherence    Patient reported X missed doses in the last month: 0  Specialty Medication: Biktarvy  Patient is on additional specialty medications: No  Any gaps in refill history greater than 2 weeks in the last 3 months: no  Demonstrates understanding of importance of adherence: yes  Informant: patient  Provider-estimated medication adherence level: good  Patient is at risk for Non-Adherence: No          Specialty medication(s) dose(s) confirmed: Regimen is correct and unchanged.     Are there any concerns with adherence? No    Adherence counseling provided? Not needed    CLINICAL MANAGEMENT AND INTERVENTION      Clinical Benefit Assessment:    Do you feel the medicine is effective or helping your condition? Yes    HIV ASSOCIATED LABS:     Lab Results   Component Value Date/Time    HIVRS Not Detected 09/18/2019 12:40 PM    HIVRS Not Detected 06/24/2019 02:05 PM    HIVRS Detected (A) 05/26/2019 01:46 PM    HIVCP <40 (H) 05/26/2019 01:46 PM    ACD4 399 (L) 09/18/2019 12:40 PM    ACD4 264 (L) 06/24/2019 02:05 PM       Clinical Benefit counseling provided? Not needed    Adverse Effects Assessment:    Are you experiencing any side effects? No    Are you experiencing difficulty administering your medicine? No    Quality of Life Assessment:    How many days over the past month did your HIV  keep you from your normal activities? For example, brushing your teeth or getting up in the morning. 0    Have you discussed this with your provider? Not needed    Therapy Appropriateness:    Is therapy appropriate? Yes, therapy is appropriate and should  be continued    DISEASE/MEDICATION-SPECIFIC INFORMATION      N/A    PATIENT SPECIFIC NEEDS     - Does the patient have any physical, cognitive, or cultural barriers? No    - Is the patient high risk? No    - Does the patient require a Care Management Plan? No     - Does the patient require physician intervention or other additional services (i.e. nutrition, smoking cessation, social work)? No      SHIPPING     Specialty Medication(s) to be Shipped:   Infectious Disease: Biktarvy    Other medication(s) to be shipped: No additional medications requested for fill at this time     Changes to insurance: No    Delivery Scheduled: Yes, Expected medication delivery date: 10/20/19.     Medication will be delivered via UPS to the confirmed prescription address in [facility].    The patient will receive a drug information handout for each medication shipped and additional FDA Medication Guides as required.  Verified that patient has previously received a Conservation officer, historic buildings.    All of the patient's questions and concerns have been addressed.    [name]   [facility] Specialty Pharmacist

## 2019-10-19 MED FILL — BIKTARVY 50 MG-200 MG-25 MG TABLET: 30 days supply | Qty: 30 | Fill #1 | Status: AC

## 2019-10-19 MED FILL — BIKTARVY 50 MG-200 MG-25 MG TABLET: ORAL | 30 days supply | Qty: 30 | Fill #1

## 2019-10-20 NOTE — Unmapped (Signed)
Referral Services Note     Duration of Intervention: 10 minutes    REASON/TYPE OF CONTACT: Phone call to discuss missed appointment at the request of Varney Daily, FNP.     ASSESSMENT: Warren [name] shared that he received a phone call from transportation this morning and they shared they would not be able to bring him to his appointments today. He has rescheduled his appointments for Thursday.     INTERVENTION: This SW provided SW support.     PLAN: Warren [name] will attend Thursday's appointments. He will reach out to this SW if additional needs arise.      Edmond Ginsberg Veverly Fells, CCM

## 2019-10-21 ENCOUNTER — Ambulatory Visit: Payer: Medicaid Other

## 2019-10-21 ENCOUNTER — Telehealth: Payer: Self-pay

## 2019-10-21 NOTE — Telephone Encounter (Signed)
Entered in error

## 2019-10-21 NOTE — Telephone Encounter (Signed)
LVM re: no show visit on 10/21/19, attendance policy, and upcoming visit on 10/31/19.

## 2019-10-22 ENCOUNTER — Ambulatory Visit: Admit: 2019-10-22 | Payer: MEDICAID

## 2019-10-22 ENCOUNTER — Encounter: Admit: 2019-10-22 | Payer: MEDICAID | Attending: Family | Primary: Family

## 2019-10-23 MED ORDER — ENOXAPARIN 100 MG/ML SUBCUTANEOUS SYRINGE
Freq: Two times a day (BID) | SUBCUTANEOUS | 2 refills | 11.00000 days
Start: 2019-10-23 — End: ?

## 2019-10-23 NOTE — Unmapped (Signed)
Med refill: Lovenox

## 2019-10-23 NOTE — Unmapped (Signed)
* YES below indicates need present in that domain. Briefly note what each  yes means. *    MEDICAL NEEDS:  1. HIV Medical Needs: no     2. HIV Medication Adherence: yes- Yes, pt. Reports that he is adherent to [employer]. Doesn't take it every day at the exact same time, but does take it within 1-3 hours of time period.    3. HIV Medical Care (engagement in care): yes, Pt. Tries his best to be engaged in care, but often has difficulty with transportation.     4. Non-HIV Medical Needs: yes- Pt. Has several non-HIV health care related needs. Pt has  recently undergone a redo-asceending aorta graft with Cardiopulmonary bypass. He has appointments with the Internal Medicine clinic to monitor his INRs and therapeutic affects of Warfarin. Pt. Reports that his INR levels are not where they  need to be. INRs have been a 1.1, when they at least need to be above 2.0. Warfarin Dosage has been switched from a once daily to a twice daily dose.     Pt. Has also had a right hip replacement at the end of August. He has not been to physical therapy because it is in [place], and he has had difficulty again with Medicaid transportation. Client states that he is no longer having difficulties with mobility and no longer needs to walk with a walker.     Pt. Also has a procedure for excision for excision of condyloma, but has not been informed of the procedure date. He has received clearance from Pre- Anesthesia.    MH/SA NEEDS:    5. Mental Health:No  6. Drug/Alcohol Use: yes  Pt. Reports that after seeking approval from surgeon, he drinks 2 beers per day. One in the morning and one at night. He uses marijuana occasionally, about once every other week.     FISCAL NEEDS:  7. Financial Situation: yes - Receives $1200 per month in social security.  8. Benefits/Insurance/Pharmacy Assistance: no      BASIC NEEDS:  9. Housing: no Moved from [place] to [place]. Pt. Reports that the move was good for him because he needed a change of pace.  10. Transportation: yes. Pt. Has missed several appointments because medicaid transportation has not come to pick him up on the day of his appointments   11. Social Support System: no, pt. Unable to comment or elaborate at the time of the phone call.     12. Food/Nutrition, Clothing, Utilities: no problems at this time    PREVENTION NEEDS:  13. Risk Reduction Counseling Needs: no    14. Disclosure to Partners: no, pt. Not able to elaborate or comment at the time of the phone call.      OTHER NEEDS: (Optional Section, add more items as needed)  15. Activities of Daily Living: no , had a  hip replacement at the end of  August but is now walking without a walker.    16. Medication Side Effects Management: no   17. Non-HIV Medication Adherence: no   18. Knowledge of Medical Aspects of HIV: no    19. Provider Relationships: no, reports that now that he is at [facility], he is receiving the best care he has ever gotten  20. Family Functioning: no, patient prefers not to answer the question at the time of the phone call  21. Domestic Violence: no    22. Other Competing Demands: no   23. Criminal Justice System Involvement: no  24. Communication Skills (  Health Literacy): no       Pt. Identified health needs:     -2nd Co-vid vaccine at next ID appt  - Inc. Physical activity  - INR recheck  - refill of lovenox  - Transportation to and from appointments.     RN contacted Medicaid transportation and emailed a list of the patient's future appointment dates and times. RN also encouraged patient to contact transportation the day before his appointment to confirm pick up times.     Duration Of Time: 35 min                                       Goal Plans    Nursing Diagnosis:  Ineffective Health Maintenence.    Goal:  Pt will comply and participate in the healthcare plan.    Interventions:   ??? Assess the pt perception of health problems.  ??? Assess pt knowledge of importance of health maintenance behaviors r/t HIV diagnosis (keeping scheduled appointments, monitoring lab values, medication adherence).  ??? Assess pt beliefs about illness (religious beliefs/practices, cultural beliefs/practices, personal beliefs/practices).  ??? Assess for potential barriers to care (familial, environmental, social, cultural).  ??? Assess pt confidence in self to do desired behavior.  ??? Assess pt reasons for non compliance with health care plan in the past.  ???  Assess pt education level and best methods of learning.  ??? Teach the patient the roles of health care providers as patient advocates.  ??? Assist pt in following up with any outside referrals.  ??? Teach benefits and importance of adherence to medications and health plan.   ??? Discuss ramifications of non adherence to medications.  ??? Discuss the importance of OI prophylaxis.  ??? Teach patient signs and symptoms to look for that require medical attention.  ??? Stress necessity of continued healthcare and follow up.  ??? Link patient with appropriate support resources.  ??? Include pt in planning of treatment.  ??? Include pt social support system as allowed by pt.  ??? Encourage questions and reinforce/clarify teaching as needed.  ??? Encourage pt to actively participate in own health care plan.    Outcomes:     Pt will demonstrate effective health maintenance AEB decrease in viral load, increase in CD4, having correct pill count, attendance of medical appointments, decrease in hospital admissions/ED visits, active participation in the health care plan.    NEED: Patient identified or is identified as having difficulty with medication adherence and medical appointment attendance.                 SHORT TERM GOALS:   1. Identify barriers to adherence an transportation  2. Demonstrate understanding of consequences of poor adherence ??? ie resistance, harm that having detectable virus can do to body, increase in possibility of transmission to others.  3. Improve adherence to </= 1 missed dose per week which will be evidenced by undetectable viral load      PERSON:  Nurse                 INTERVENTIONS:   1. Nurse  will establish therapeutic/trust relationship and assess medication readiness with patient through use of motivational interviewing techniques  2. Nurse will provide education regarding the importance of medication  Adherence as well as consequences of non adherence.  3. Nurse will discuss the importance of honest communication with nurse/staff regarding level of adherence, difficulties  with adherence, and side effects  4. Nurse  will discuss strategies for medication adherence and provide pt with medication adherence tools  5. Nurse and Patient will coordinate with financial counselor regarding need financial assistance related to obtaining medications (Co-pay card, ADAP, SPAP, ICAP, etc)..  6. Nurse will contact patient by by phone on a monthly basis phone  to assess level of adherence, presence of side effects, provide continued education, offer support and encouragement.  7. Pt will attend regular appointments in clinic at least 3x per year.    8. Pt will notify nurse  if barriers to appointment attendance arise and nurse/SW will assist pt to access regular transportation assistance.                 OUTCOME:   1. Documented patient report of improved adherence in EPIC  Documented lab evidence (< viral load) of improved adherence as well as patient remaining in care with at least 3 visits per year to see provider     DATE REVIEW/TIME LINE: 6 MONTHS  Patient is aware of and in agreement with this plan

## 2019-10-26 MED ORDER — ENOXAPARIN 100 MG/ML SUBCUTANEOUS SYRINGE
Freq: Two times a day (BID) | SUBCUTANEOUS | 2 refills | 11.00000 days | Status: CP
Start: 2019-10-26 — End: ?

## 2019-10-27 ENCOUNTER — Ambulatory Visit: Admit: 2019-10-27 | Discharge: 2019-10-28 | Payer: MEDICAID

## 2019-10-27 DIAGNOSIS — S72001A Fracture of unspecified part of neck of right femur, initial encounter for closed fracture: Principal | ICD-10-CM

## 2019-10-27 DIAGNOSIS — Z952 Presence of prosthetic heart valve: Principal | ICD-10-CM

## 2019-10-27 MED ORDER — WARFARIN 2.5 MG TABLET: tablet | 11 refills | 0 days | Status: AC

## 2019-10-27 MED ORDER — ENOXAPARIN 100 MG/ML SUBCUTANEOUS SYRINGE: 100 mg | mL | Freq: Two times a day (BID) | 3 refills | 30 days | Status: AC

## 2019-10-27 MED ORDER — ENOXAPARIN 100 MG/ML SUBCUTANEOUS SYRINGE: 90 mg | mL | Freq: Two times a day (BID) | 5 refills | 11 days | Status: AC

## 2019-10-27 MED ORDER — WARFARIN 2.5 MG TABLET
ORAL_TABLET | 11 refills | 0.00000 days | Status: CP
Start: 2019-10-27 — End: 2019-10-27

## 2019-10-27 NOTE — Unmapped (Signed)
Assessment/Plan:     Current warfarin Dose:  5mg  dail + LMWH 100mg  BID  Steady State:yes  Date INR: October 27, 2019  Goal INR: 2-3  INR Results: 1.1  Lab Results   Component Value Date    INR, POC 1.1 10/27/2019    INR, POC 1.1 09/18/2019    INR, POC 1.1 09/11/2019       ASSESSMENT/PLAN:  Plan for Warfarin Dose:  increase dose: 7.5mg  daily + LMWH 100mg  BID    1. INR subtherapeutic, reported to be adherent.  Continue tx dose (approx 95kg) LMWH bridge until therapeutic INR given fairly recent h/o CVA.  Maintain close f/u.   2. R THR - no longer using walker, continues to improve postop    Repeat INR Date:  1 wk    Subject/Objective:     Patient: Warren [name]. 44 y.o.    Medical Record Number:  [id number]    PRIMARY CARE PHYSICIAN: Idelia Salm, MD    XB:[id number] mechanical heart valve, THR    PMHx:   Patient Active Problem List   Diagnosis   ??? Acute bacterial endocarditis   ??? History of prosthetic aortic valve   ??? HIV (human immunodeficiency virus infection) (CMS-HCC)   ??? Bacteremia due to Staphylococcus aureus   ??? Tobacco use disorder   ??? AKI (acute kidney injury) (CMS-HCC)   ??? Severe aortic insufficiency   ??? HLD (hyperlipidemia)   ??? CVA (cerebral vascular accident) (CMS-HCC)   ??? Status post mechanical aortic valve replacement   ??? Thrombocytopenia (CMS-HCC)   ??? Other long term (current) drug therapy   ??? Leukocytosis   ??? Hx of repair of aortic root   ??? History of antiretroviral therapy   ??? Complete heart block (CMS-HCC)   ??? Closed displaced fracture of right femoral neck (CMS-HCC)   ??? Aortic valve endocarditis   ??? Acute postoperative pain   ??? Acute blood loss anemia          Your Medication List          Accurate as of October 27, 2019  2:35 PM. If you have any questions, ask your nurse or doctor.            CHANGE how you take these medications    enoxaparin 100 mg/mL injection  Commonly known as: LOVENOX  Inject 1 mL (100 mg total) under the skin Two (2) times a day.  What changed: how much to take     warfarin 2.5 MG tablet  Commonly known as: JANTOVEN  7.5mg  (3 tabs) daily or as directed  What changed: additional instructions        CONTINUE taking these medications    BIKTARVY 50-200-25 mg tablet  Generic drug: bictegrav-emtricit-tenofov ala  Take 1 tablet by mouth daily.     metoprolol tartrate 25 MG tablet  Commonly known as: LOPRESSOR  Take 25 mg by mouth every twelve (12) hours.     oxyCODONE-acetaminophen 5-325 mg per tablet  Commonly known as: PERCOCET  Take 1-2 tablets by mouth every eight (8) hours.     potassium chloride 20 MEQ CR tablet  Commonly known as: KLOR-CON  Take 20 mEq by mouth daily.     pravastatin 80 MG tablet  Commonly known as: PRAVACHOL  Take 1 tablet (80 mg total) by mouth daily.              General:  Well appearing, no acute distress    Signs/Symptoms bleeding or bruising: no  Signs/Symptoms of Potential Embolic Events: no    Changes In:   Diet:no   Medications:no   Activities / Lifestyle:no   Health Status:no   Nonadherent: no       Current EtOH Use:no   Current Smoker/tobacco user: no    Medications reviewed including OTC and herbals:yes    The patient set a personal goal for anticoagulation management today:Take warfarin daily as recommended.    Medication adherence and barriers to the treatment plan have been addressed. Opportunities to optimize healthy behaviors have been discussed. Patient / caregiver voiced understanding.    Additional History:

## 2019-10-27 NOTE — Unmapped (Signed)
Refill provided 10/27/19 during clinic

## 2019-10-27 NOTE — Unmapped (Signed)
INR     1.1         SEC    13.1

## 2019-10-29 MED ORDER — ATORVASTATIN 40 MG TABLET
ORAL | 0.00000 days
Start: 2019-10-29 — End: ?

## 2019-10-30 NOTE — Unmapped (Signed)
RN attempted to reach patient to find out how his week went and to remind him of his appointment on Tuesday. There was no answer at the time of the phone call. Message left with the clinic phone number requesting the patient call back.

## 2019-10-31 ENCOUNTER — Ambulatory Visit: Payer: Medicaid Other | Attending: Orthopaedic Surgery

## 2019-11-03 ENCOUNTER — Encounter: Admit: 2019-11-03 | Discharge: 2019-11-03 | Payer: MEDICAID

## 2019-11-03 ENCOUNTER — Ambulatory Visit: Admit: 2019-11-03 | Discharge: 2019-11-03 | Payer: MEDICAID

## 2019-11-03 ENCOUNTER — Ambulatory Visit: Admit: 2019-11-03 | Discharge: 2019-11-03 | Payer: MEDICAID | Attending: Family | Primary: Family

## 2019-11-03 DIAGNOSIS — B20 Human immunodeficiency virus [HIV] disease: Principal | ICD-10-CM

## 2019-11-03 LAB — CBC W/ AUTO DIFF
BASOPHILS ABSOLUTE COUNT: 0.1 10*9/L (ref 0.0–0.1)
BASOPHILS RELATIVE PERCENT: 1.3 %
EOSINOPHILS RELATIVE PERCENT: 2.2 %
HEMATOCRIT: 41.3 % (ref 38.0–50.0)
HEMOGLOBIN: 13.6 g/dL (ref 13.5–17.5)
LYMPHOCYTES ABSOLUTE COUNT: 2.1 10*9/L (ref 0.7–4.0)
MEAN CORPUSCULAR HEMOGLOBIN CONC: 33 g/dL (ref 30.0–36.0)
MEAN CORPUSCULAR VOLUME: 81.3 fL (ref 81.0–95.0)
MEAN PLATELET VOLUME: 7.5 fL (ref 7.0–10.0)
MONOCYTES ABSOLUTE COUNT: 0.4 10*9/L (ref 0.1–1.0)
MONOCYTES RELATIVE PERCENT: 7.8 %
NEUTROPHILS ABSOLUTE COUNT: 2.7 10*9/L (ref 1.7–7.7)
NEUTROPHILS RELATIVE PERCENT: 49.9 %
PLATELET COUNT: 331 10*9/L (ref 150–450)
RED BLOOD CELL COUNT: 5.08 10*12/L (ref 4.32–5.72)
RED CELL DISTRIBUTION WIDTH: 16.1 % — ABNORMAL HIGH (ref 12.0–15.0)
WBC ADJUSTED: 5.5 10*9/L (ref 3.5–10.5)

## 2019-11-03 LAB — PLATELET COUNT: Platelets:NCnc:Pt:Bld:Qn:Automated count: 331

## 2019-11-03 MED ORDER — BICTEGRAVIR 50 MG-EMTRICITABINE 200 MG-TENOFOVIR ALAFENAM 25 MG TABLET
ORAL_TABLET | Freq: Every day | ORAL | 5 refills | 30 days | Status: CP
Start: 2019-11-03 — End: ?
  Filled 2019-11-19: qty 30, 30d supply, fill #0

## 2019-11-03 NOTE — Unmapped (Signed)
[facility] INFECTIOUS DISEASES CLINIC FOLLOW UP VISIT    Assessment/Recommendations:  Warren Fries. is being seen in the Infectious Diseases Clinic for HIV follow up.      Warren [name] was seen today for follow-up.    Diagnoses and all orders for this visit:    HIV disease (CMS-HCC)  -     CBC w/ Differential  -     HIV RNA, Quantitative, PCR  -     bictegrav-emtricit-tenofov ala (BIKTARVY) 50-200-25 mg tablet; Take 1 tablet by mouth daily.      HIV  Doing well overall. Fills ART via Medicaid.   ?? According to notes, patient was diagnosed in 2008 after routine STI testing.   ?? Denies prior opportunistic infections.  ?? Has been non adherent with ART in the past. Last took [place].  ?? 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  ?? Patient has been out of care since moving to [place] from [place] last year in 06/2018.  ?? Continue current therapy on Biktarvy   ?? CBC w diff, HIV RNA.  ?? Encouraged continued excellent ARV adherence.   ?? No need for OI prophylaxis as CD4= >200  ?? Now receiving ART via mail order through [facility].  ?? Patient is meeting with M. Nelda Bucks, LCSW to discuss housing options and alternate transportation possibilities.    Lab Results   Component Value Date    ACD4 399 (L) 09/18/2019    CD4 21 (L) 09/18/2019    HIVCP <40 (H) 05/26/2019    HIVRS Not Detected 09/18/2019     Genital warts, chronic  ?? Diagnosed in [place]. Evaluated with previous PCP in [place], first treated with cryotherapy and then Condylox without relief.   ?? Seen by Dr. Neysa Hotter who agreed with need for surgical intervention and biopsy. Seen by pre-op clinical for evaluation, patient is unsure when surgery is scheduled for.     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    ?? On 4/17 patient presented to OSH [facility]) with fevers/chillls/weakness with MSSA cultures positive. TEE on 4/21 showed a 2x2 cm vegetation on the non-coronary cusp on the aortic valve with mild-mod AI.  ?? Treated with Vancomycin and Cefepime, then narrowed to Cefazolin.  ?? 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 Pacemaker evaluation showed Medtronic Azure Dual Chamber Pacemaker, EKG &??telemetry demonstrating a-sensed, v-paced rhythm. Repeat TTE 5/4 showed Paravalvular echo lucency measuring approximately 2cm wide as previously seen on TEE. It extends 180 degrees along the right border of the homograft. There appears to be dehiscence and rocking of the aortic homograft. His status is tenuous with extremely high risk of decompensation &??death should the graft fail.   ?? Transferred to [facility] on 05/26/19, antibiotics changed to [name] 2 g q 4h x 6 weeks (05/26/19-07/05/19), Gentamicin x 2 weeks (05/26/19-06/13/19, completed) and Rifampin 300mg  q8h x 6 weeks (05/26/19-07/05/19)  ?? Underwent redo ASCENDING AORTA GRAFT, WITH CARDIOPULMONARY BYPASS, WITH AORTIC ROOT REPLACEMENT USING VALVED CONDUIT AND CORONARY RECONSTRUCTION (EG,BENTALL), redo sternotomy s/p Root replacement Feb 2020 with [name] at [facility], 07/21/19.  ?? On anticogaulation with Coumadin and Lovenox. INR goal of 2-3. Patient still not therapeutic on Coumadin.  ?? [name] at  [facility] has asked patient to start taking aspirin as well.    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 Complicated hospital course in 02/2018, see records in media tab for details. Summary below:  ?? 03/20/18: S/p aortic root replacement with homograft 25mm, reconstruction of aortomitral continuity, repair of right atrial fistula, mitral valve repair, Epicardial pacemaker placed for bradycardia   ?? History of bicuspid aortic valve with mod-severe AI  ?? AV vegetations found on TEE, blood cultures grew S. Pneumoniae  ?? Treated initially with Vancomycin and Ceftriaxone x 2 weeks to cover for possible meningitis as well and discharged to rehab on Ceftriaxone 2g IV daily x 4 weeks.     Sexual health & secondary prevention  Sex with women. Monogamous with single partner, has not had sexual contact since April 2021.  He does disclose status. Sometimes uses condoms.    Lab Results   Component Value Date    CTNAA Negative 06/23/2019    GCNAA Negative 06/23/2019    SPECTYPE Urine 06/23/2019    SPECSOURCE Urine 06/23/2019     ?? GC/CT NAATs -- tested on 06/23/19  ?? RPR -- NR 05/15/19 - repeat 1Y    Health maintenance  PCP: Dr. Johnsie Kindred  Lab Results   Component Value Date    CREATININE 1.05 09/18/2019    HEPCAB Nonreactive 07/10/2019    CHOL 134 05/25/2019    HDL 29 (L) 05/25/2019    LDL 54 (L) 05/25/2019    NONHDL 105 05/25/2019    TRIG 256 (H) 05/25/2019    A1C <4.0 (L) 07/10/2019     Communicable diseases  # TB - Needed  # HCV - negative 05/12/19; rescreen w/Ab q1-2y  05/12/19 Hep A IgM NR  05/12/19 Hep B C IgM NR    Cancer screening  # Anorectal - not yet done, patient is to undergo anal surgery and biopsies with further monitoring of anal canal  # Colorectal - SCREEN AGE 42+  # Liver - not applicable  # Lung - SCREEN AGE 74-80 IF >30 PY & CURRENT OR QUIT <15Y AGO  # Prostate - SCREEN AGE 60 HI-RISK, 50 OTW -- Q2-4Y    Cardiovascular disease  # The ASCVD Risk score Denman George DC Montez Hageman, et al., 2013) failed to calculate.  - No aspirin  - On statin (Pravastatin)  - BP controlled  - Smoker, 0.5 PPD x 20 years, now down to 4-5 cig/day. Uninterested in smoking cessation at this time.    Immunization History   Administered Date(s) Administered   ??? COVID-19 VACC,MRNA,(PFIZER)(PF)(IM) 08/13/2019, 11/03/2019     ?? Screening ordered today: IGRA  ?? Immunizations ordered today: Pfizer vaccine #2    COVID Education:  - Discussed the current COVID pandemic and strategies to avoid infection and what to do if the patient becomes symptomatic.  - Encouraged good hand washing (20 seconds), social distancing, limiting close personal contact - which may include new sexual partners or having multiple partners during this period.   - Discussed isolating at home but also ways to limit social isolation, such as continuing engagement with people either electronically or with safe distancing, and the ability to go outdoors alone or separated from others  - If the patient becomes ill with fever, respiratory  illness, sudden loss of taste and smell, GI complaints - contact clinic for further instructions.  - Reasons to visit the emergency department include SOB, confusion, lightheadedness when standing.   - Needs second CoVid vaccine today    I personally spent 45 minutes face-to-face and non-face-to-face in the care of this patient, which includes all pre, intra, and post visit time on the date of service.    Disposition  Return to clinic 4-6 weeks or sooner if needed.    Next visit:  ?? Needs release for HIV care, [facility] in [facility], FNP-BC  [facility] at [facility]  [address], [place], [place] [address]    Phone: [phone]   Fax: [phone]        Subjective:      Chief Complaint   OPAT and HIV followup    HPI  Return patient visit for Warren Utsey., a 44 y.o. male.     HIV:  ?? Denies missed doses.  ?? Denies any side effects from medications.  Denies any fever, chills, nausea, vomiting, rash, urinary complaints, diarrhea, constipation.  Patient reports partner has become a heavy drinker which has changed their relationship. He would like to find his own place because he suspects that their relationship is coming to an end. He has saved $700 to buy a car. His last car was not reliable and does not work.  Has been having trouble with Medicaid transport, has had to cancel multiple appointments due to inability of contracted transportation to bring him to the appointments. He is frustrated by this.    Valve replacement:   ?? Continues to do well  ?? Has follow up with [name] soon  ?? Denies SOB, chest pain    Right hip replacement through [facility]  ?? Girlfriend had fallen and he tried to lift her up but ended up falling back instead. He broke his hip and required surgery. Post op 2 weeks. Ambulating without a walker.  ?? Started PT but had to stop because could not secure reliable transportation to his appointments.    Genital warts:  ?? Patient would like to know when he will undergo anal surgery.      Past Medical History:   Diagnosis Date   ??? Bicuspid aortic valve     mod-severe AI   ??? Endocarditis 02/2018    S. Pneumoniae, Tx Vancomycin and Ceftriaxone x 2 weeks, then Ceftriaxone 2g daily x 4 weeks   ??? 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 05/26/19 [name] 12g continuous infusion+Rifampin 300mg  Q8H, 06/25/19 Change to Cefazolin 2g Q8H+Rifampin, TTE 5/4 showed Paravalvular echo lucency measuring approximately 2cm wide as previously seen on TEE. It extends 180 degrees along the right border of the homograft. Appears to be dehiscence and rocking of the aortic homograft.   ??? HIV disease (CMS-HCC) 2008    Dx. in [place]. Denies OI. History of nonadherence to ART. Last on Symtuza. Started on Dolutegravir 50mg  BID and Truvada daily.   ??? Myocardial infarction (CMS-HCC)     Feb 2020   ??? Stroke (CMS-HCC)        Medications and Allergies   Reviewed and updated today. See bottom of this visit's encounter summary for details.  Current Outpatient Medications on File Prior to Visit   Medication Sig   ??? aspirin (ECOTRIN) 81 MG tablet Take 81 mg by mouth daily.   ???  atorvastatin (LIPITOR) 40 MG tablet Take 40 mg by mouth.   ??? enoxaparin (LOVENOX) 100 mg/mL injection Inject 1 mL (100 mg total) under the skin Two (2) times a day.   ??? metoprolol tartrate (LOPRESSOR) 25 MG tablet Take 25 mg by mouth every twelve (12) hours.    ??? oxyCODONE-acetaminophen (PERCOCET) 5-325 mg per tablet Take 1-2 tablets by mouth every eight (8) hours.   ??? potassium chloride (KLOR-CON) 20 MEQ CR tablet Take 20 mEq by mouth daily.   ??? warfarin (JANTOVEN) 2.5 MG tablet 7.5mg  (3 tabs) daily or as directed     No current facility-administered medications on file prior to visit.     Previous antibiotics:  Gentamicin  Vancomycin  Ceftriaxone  [name], stopped due to AIN    Current/Prior immunomodulators:  None    Allergies  No Known Allergies    Social History  Social History     Tobacco Use   ??? Smoking status: Current Every Day Smoker     Packs/day: 0.25     Types: Cigarettes   ??? Smokeless tobacco: Never Used   ??? Tobacco comment: 4 cigarettes per day   Substance Use Topics   ??? Alcohol use: Yes     Alcohol/week: 2.0 standard drinks     Types: 2 Cans of beer per week     Comment: daily     Family History  Family History   Problem Relation Age of Onset   ??? Mental illness Mother    ??? No Known Problems Father    ??? No Known Problems Sister    ??? No Known Problems Brother        Review of Systems  As per HPI. Remainder of 10 systems reviewed, negative.        Immunizations:  Immunization History   Administered Date(s) Administered   ??? COVID-19 VACC,MRNA,(PFIZER)(PF)(IM) 08/13/2019, 11/03/2019       Review of Systems:  10 systems reviewed and negative except as per HPI.       Objective:      BP 98/74 (BP Site: L Arm, BP Position: Sitting, BP Cuff Size: Large)  - Pulse 90  - Temp 36.7 ??C (98 ??F)  - Ht 182.9 cm (6')  - Wt 97 kg (213 lb 12.8 oz)  - BMI 29.00 kg/m??   Wt Readings from Last 3 Encounters:   11/03/19 97 kg (213 lb 12.8 oz)   10/08/19 94.8 kg (209 lb)   10/01/19 94.8 kg (209 lb)       Const looks well and attentive, alert, appropriate Eyes sclerae anicteric, noninjected OU   ENT no thrush, leukoplakia or oral lesions   Lymph no cervical or supraclavicular LAD   CV RRR. No murmurs. No rub or gallop. S1/S2. Sharp click with S2 noted from valve replacement.   Lungs CTAB ant/post, normal work of breathing   GI Soft, no organomegaly. NTND. NABS.   GU deferred   Rectal deferred   Skin no petechiae, ecchymoses or obvious rashes on clothed exam, right hip incision well approximated, and intact. No erythema   MSK no joint tenderness and normal ROM throughout   Neuro grossly intact   Psych Appropriate affect. Eye contact good. Linear thoughts. Fluent speech. In good spirits.     Laboratory Data  Reviewed in Epic today, using Synopsis and Chart Review filters.    Lab Results   Component Value Date    CREATININE 1.05 09/18/2019    HEPCAB Nonreactive 07/10/2019  CHOL 134 05/25/2019    HDL 29 (L) 05/25/2019    LDL 54 (L) 05/25/2019    NONHDL 105 05/25/2019    TRIG 256 (H) 05/25/2019    A1C <4.0 (L) 07/10/2019     Laboratory Data  Reviewed in Epic today, using Synopsis and Chart Review filters.    Lab Results   Component Value Date    CREATININE 1.05 09/18/2019    HEPCAB Nonreactive 07/10/2019    CHOL 134 05/25/2019    HDL 29 (L) 05/25/2019    LDL 54 (L) 05/25/2019    NONHDL 105 05/25/2019    TRIG 256 (H) 05/25/2019    A1C <4.0 (L) 07/10/2019     Lab Results   Component Value Date    WBC 5.5 09/18/2019    NEUTROABS 2.9 09/18/2019    LYMPHSABS 1.9 09/18/2019    EOSABS 0.2 09/18/2019    HGB 12.1 (L) 09/18/2019    HCT 38.4 09/18/2019    PLT 505 (H) 09/18/2019       Lab Results   Component Value Date    NA 139 09/18/2019    K 4.2 09/18/2019    CL 110 (H) 09/18/2019    CO2 23.6 09/18/2019    BUN 13 09/18/2019    CREATININE 1.05 09/18/2019    CALCIUM 10.2 09/18/2019    MG 2.2 06/02/2019       Lab Results   Component Value Date    ALKPHOS 97 06/17/2019    BILITOT 0.5 09/18/2019    PROT 7.7 06/17/2019    ALBUMIN 4.0 06/17/2019    ALT 32 09/18/2019    AST 30 09/18/2019       No results found for: ESR, CRP      Serologies:  Lab Results   Component Value Date    Hep B S Ab Nonreactive 06/24/2019    Hep B Core Total Ab Nonreactive 06/24/2019    Hepatitis C Ab Nonreactive 07/10/2019       Microbiology:    No results found for: ANACXNo results found for: ANACX    Studies:   05/26/19 Echocardiogram  05/26/19 Pacemaker Evaluation

## 2019-11-03 NOTE — Unmapped (Unsigned)
Referral Services Note     Duration of Intervention: 10 min     REASON/TYPE OF CONTACT: Face to Face, SW met with patient regarding ongoing transportation issues and housing concerns.     ASSESSMENT: Patient reports that he continues to have issues with medicaid transportation.  Transportation has not been reliable to pick patient up.  Patient was able to get a ride from a neighbor today but cannot rely on that in the future. Patient also reports that housing situation has not been working out and he foresees needing to be in different housing. Patient reports making about 1200/month. Patient would like to move closer to his doctors for convenience sakes. Patient open to getting connected to opportunities for housing in other counties. Patient agreed to look through [email or web address] for opportunities.     INTERVENTION:  SW assessed needs and discussed resources in the community.     PLAN:  Patient will look through housing opportunities and discuss next steps to help connect with RW funding for housing.     Mayumi Summerson LCSW, LCAS  ID Clinic Social Work   Direct: [phone]

## 2019-11-03 NOTE — Unmapped (Signed)
Met w/pt in clinic to complete RW application. Completed application w/pt. Verified income documents on file. Pt stated he does not want anything mailed to the address on file, will be obtaining a PO Box. Pt is aware we cannot complete application until mailing address has been given. Pt stated it was okay to email him a reminder, email sent to: [email or web address]    Kaitlyn Abshire  Benefits Coordinator, ID Clinic  Time of intervention: 10 mins

## 2019-11-04 ENCOUNTER — Telehealth: Payer: Self-pay

## 2019-11-04 ENCOUNTER — Ambulatory Visit: Payer: Medicaid Other

## 2019-11-04 NOTE — Telephone Encounter (Signed)
Called pt re: no show appt. Pt has had several no show appts and is therefore DCed from PT services at this time. Pt may return to PT with a new referral for PT from his MD.

## 2019-11-04 NOTE — Unmapped (Signed)
Referral Services Note     Duration of Intervention: 15 min     REASON/TYPE OF CONTACT: Phone and E-mail, SW contacted [employer], point person for [employer] in [place] to connect with a community Sports coach in [place].  Chavanne Lamb connected to [employer].     ASSESSMENT: SW reached out to Doralee Albino, community case Production designer, theatre/television/film in [place].  Case manager discussed ability to take on cases in [place] county area.  SW provided brief history of patient and ongoing needs. Case manager discussed her ability to connect and make a referral to Erick Blinks for [employer]. Junious Dresser took down patient name and contact information and reports that she should be able to meet with him in a couple of weeks for an intake.      INTERVENTION:  SW made a referral for ongoing community case management for patient. Referral placed with Erick Blinks for [employer].     PLAN:  SW will reach out to patient to make patient aware that case manager, Doralee Albino would be reaching out.     Cobie Leidner LCSW, LCAS  ID Clinic Social Work   Direct: [phone]

## 2019-11-05 LAB — HIV RNA, QUANTITATIVE, PCR

## 2019-11-05 LAB — HIV RNA: HIV 1 RNA:NCnc:Pt:Ser/Plas:Qn:Probe.amp.tar: 0

## 2019-11-06 NOTE — Unmapped (Signed)
Encounter Date : 11/03/2019    Encounter Type: Face to Face    Reason: Patient came in for in person return visit with Varney Daily, FNP    Assessment: Pt. Came in for return appointment 10:30, but his appointment time was not until 1:30. Pt. Received his second Covid vaccine in the same day clinic first, and then was seen in the ID clinic at 12:30pm. Pt. Reports that although RN sent times and dates of appointments to [facility] transportation, they have not been picking him up for medical appointments. Pt. Reports that today, and last week he was able to get a ride from his girlfriend's best friend, but he is concerned about how sustainable this will be over a long term period of time. He has appointments at both [facility] and [facility], and is concerned about appointments that he missed at [facility]. Pt. Also mentions that he wanted to talk about housing during his visit today. He is currently staying with his girlfriend, but is aware that this will not work for much longer. Pt. Reports that his girlfriend recently lost her job, and since then she  constantly calls him to find out what he;s doing, and the situation becomes more volatile. Pt. States that he is on disability, and gets about 1100 dollars per month. He would like to move closer to [facility], so that he is closer to his appointments. Pt. Also mentions that he continues to have his INRs taken in the Internal Medicine clinic, but his lab continues to comeback at 1.1, which is considered subtherapeutic.Goal INF is 2-3.  His Warfarin dose was increased from 5 mg daily to 7.5 mg daily. Pt. Reports that [facility]Dr. At [facility] also wants him to take Aspirin.     Adherence: Pt. Reports consistent adherence to Biktarvy    Intervention: Pt. Was connected to Social worker [place], LCSW for housing and transportation needs. He was also referred to Theda Sers  for RW renewal.    Plan: Continue to contact patient frequently to make sure he gets to and from appointments at [facility]. Also frequently assess the stability  of the patient's housing, and reassess for medical needs.    Duration: 20 minutes

## 2019-11-06 NOTE — Unmapped (Signed)
Referral Services Note     Duration of Intervention: <     REASON/TYPE OF CONTACT: Phone, SW followed up with patient regarding housing supports and community case manager connection     ASSESSMENT: Patient did not answer    INTERVENTION:  SW attempted to engage patient regarding referrals made for housing and case management assistance.     PLAN:  SW will reach out in one week to engage.     Zackary Mckeone LCSW, LCAS  ID Clinic Social Work   Direct: [phone]

## 2019-11-10 NOTE — Unmapped (Unsigned)
Referral Services Note     Duration of Intervention: < 5 min     REASON/TYPE OF CONTACT: Phone, SW attempted to reach out to patient to follow up on resources connected to patient including housing and case manager support     ASSESSMENT: Patient did not answer     INTERVENTION:  SW left a discreet message for a call back     PLAN:  Social worker will reach out in 1 week. Attempt 2/3.     Hussein Macdougal LCSW, LCAS  ID Clinic Social Work   Direct: [phone]

## 2019-11-12 ENCOUNTER — Ambulatory Visit: Admit: 2019-11-12 | Payer: MEDICAID

## 2019-11-16 NOTE — Unmapped (Signed)
called to schedule coag appointment with Dr Lorin Picket

## 2019-11-17 NOTE — Unmapped (Signed)
Referral Services Note     Duration of Intervention: 5 min     REASON/TYPE OF CONTACT: E-mail, Doralee Albino, Community Case Production designer, theatre/television/film for [facility], emailed this Child psychotherapist with an update for this patient.     ASSESSMENT: Ms. Leonor Liv reported that she reached out to patient and patient had been in contact with Erick Blinks and was involved in the [employer] program. Patient had reported intentions of moving to [place] or [place]. Doralee Albino was going to hold off on case management services until he decided where he would be with housing.  SW attempted to call patient for a direct update but patient did not answer. Social worker left a discreet message for a call back.  Patient was given resources to both [facility] in [place] and [employer] and agreed to call back Ms. Leonor Liv when he figures out where he plans to move.      INTERVENTION:  SW engaged in collaboration with community case managers to help patient get connected with their community.    PLAN:  Patient will reach out to this Child psychotherapist as needed. SW copied nursing case manager, Francisca December, to note to update about current state and collaboration with patient.     Ananda Sitzer LCSW, LCAS  ID Clinic Social Work   Direct: [phone]

## 2019-11-18 NOTE — Unmapped (Signed)
[facility] Refill Coordination Note    Specialty Medication(s) to be Shipped:   Infectious Disease: Biktarvy    Other medication(s) to be shipped: No additional medications requested for fill at this time     Warren Klecka., DOB: 08/15/1975  Phone: [phone] (home)       All above HIPAA information was verified with patient.     Was a Nurse, learning disability used for this call? No    Completed refill call assessment today to schedule patient's medication shipment from the [facility] [phone]).       Specialty medication(s) and dose(s) confirmed: Regimen is correct and unchanged.   Changes to medications: Warren [name] reports no changes at this time.  Changes to insurance: No  Questions for the pharmacist: No    Confirmed patient received Welcome Packet with first shipment. The patient will receive a drug information handout for each medication shipped and additional FDA Medication Guides as required.       DISEASE/MEDICATION-SPECIFIC INFORMATION        N/A    SPECIALTY MEDICATION ADHERENCE     Medication Adherence    Patient reported X missed doses in the last month: 0  Specialty Medication: biktarvy 20-200-25mg   Patient is on additional specialty medications: No  Patient is on more than two specialty medications: No                Biktarvy 50-200-25 mg: 5 days of medicine on hand         SHIPPING     Shipping address confirmed in Epic.     Delivery Scheduled: Yes, Expected medication delivery date: 11/20/19.     Medication will be delivered via UPS to the prescription address in Epic WAM.    Nancy Nordmann [facility] Specialty Technician

## 2019-11-19 MED FILL — BIKTARVY 50 MG-200 MG-25 MG TABLET: 30 days supply | Qty: 30 | Fill #0 | Status: AC

## 2019-11-20 NOTE — Unmapped (Signed)
Encounter Date: 11/20/2019    Encounter Type: Phone    Attempted to contact patient for updates on medical and housing needs. There was no answer at the time of the phone call, left VM for patient to call back.

## 2019-11-26 ENCOUNTER — Ambulatory Visit: Payer: Medicaid Other | Admitting: Orthopaedic Surgery

## 2019-12-02 NOTE — Unmapped (Signed)
Encounter Date: 12/02/2019    Encounter Type: Phone    RN attempted to contact patient to checkin with patient to find out if he had any medical or housing needs at this time. There was no answer at the time of the phone call, RN left VM requesting the patient to call back.

## 2019-12-09 ENCOUNTER — Encounter: Admit: 2019-12-09 | Discharge: 2019-12-10 | Payer: MEDICAID

## 2019-12-09 DIAGNOSIS — I639 Cerebral infarction, unspecified: Principal | ICD-10-CM

## 2019-12-09 DIAGNOSIS — I330 Acute and subacute infective endocarditis: Principal | ICD-10-CM

## 2019-12-09 DIAGNOSIS — Z952 Presence of prosthetic heart valve: Principal | ICD-10-CM

## 2019-12-09 NOTE — Unmapped (Signed)
Assessment/Plan:     Current warfarin Dose:  7.5mg  daily + LMWH 100mg  BID  Steady State:no  Date INR: December 09, 2019  Goal INR: 2-3  INR Results: 1.0  Lab Results   Component Value Date    INR, POC 1.1 10/27/2019    INR, POC 1.1 09/18/2019    INR, POC 1.1 09/11/2019       ASSESSMENT/PLAN:  Plan for Warfarin Dose:  continue current dose    1. INR subtherapeutic, nonadherent (several warfarin doses).  Given hx of CVA in 2020 (see add hx), prior to mech AVR 07/21/19, reasonable to d/c LMWH bridge at this time.  Discussed importance of adherence and to set phone reminders, reassess at [facility].    2. Time - >93min spent w/ >50% in face-to-face education/counselling and treatment planning regarding above issues.    Repeat INR Date:  2 wks    Subject/Objective:     Patient: Warren [name]. 44 y.o.    Medical Record Number:  [id number]    PRIMARY CARE PHYSICIAN: Idelia Salm, MD    XB:[id number] mechanical heart valve    PMHx:   Patient Active Problem List   Diagnosis   ??? Acute bacterial endocarditis   ??? History of prosthetic aortic valve   ??? HIV (human immunodeficiency virus infection) (CMS-HCC)   ??? Bacteremia due to Staphylococcus aureus   ??? Tobacco use disorder   ??? AKI (acute kidney injury) (CMS-HCC)   ??? Severe aortic insufficiency   ??? HLD (hyperlipidemia)   ??? CVA (cerebral vascular accident) (CMS-HCC)   ??? Status post mechanical aortic valve replacement   ??? Thrombocytopenia (CMS-HCC)   ??? Other long term (current) drug therapy   ??? Leukocytosis   ??? Hx of repair of aortic root   ??? History of antiretroviral therapy   ??? Complete heart block (CMS-HCC)   ??? Closed displaced fracture of right femoral neck (CMS-HCC)   ??? Aortic valve endocarditis   ??? Acute postoperative pain   ??? Acute blood loss anemia          Your Medication List          Accurate as of December 09, 2019  2:15 PM. If you have any questions, ask your nurse or doctor.            STOP taking these medications    enoxaparin 100 mg/mL injection  Commonly known as: LOVENOX        CONTINUE taking these medications    aspirin 81 MG tablet  Commonly known as: ECOTRIN  Take 81 mg by mouth daily.     atorvastatin 40 MG tablet  Commonly known as: LIPITOR  Take 40 mg by mouth.     BIKTARVY 50-200-25 mg tablet  Generic drug: bictegrav-emtricit-tenofov ala  Take 1 tablet by mouth daily.     metoprolol tartrate 25 MG tablet  Commonly known as: LOPRESSOR  Take 25 mg by mouth every twelve (12) hours.     oxyCODONE-acetaminophen 5-325 mg per tablet  Commonly known as: PERCOCET  Take 1-2 tablets by mouth every eight (8) hours.     potassium chloride 20 MEQ CR tablet  Commonly known as: KLOR-CON  Take 20 mEq by mouth daily.     warfarin 2.5 MG tablet  Commonly known as: JANTOVEN  7.5mg  (3 tabs) daily or as directed              General:  Well appearing, no acute distress    Signs/Symptoms bleeding or  bruising: no    Signs/Symptoms of Potential Embolic Events: no    Changes In:   Diet:no   Medications:no   Activities / Lifestyle:no   Health Status:no   Nonadherent: no       Medications reviewed including OTC and herbals:yes    The patient set a personal goal for anticoagulation management today:Take warfarin daily as recommended.    Medication adherence and barriers to the treatment plan have been addressed. Opportunities to optimize healthy behaviors have been discussed. Patient / caregiver voiced understanding.    Additional History: per [facility] surgery: '43 y.o. male who was seen in consultation by [name] on 06/04/19 for surgical evaluation of severe prosthetic aortic valve endocarditis. 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 He recovered at rehab for 6 weeks and since has recovered with no residual deficits. 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 He was discharged with IV antibiotics. Ultimately, he was evaluated for high risk redo surgery virtually. It was recommended to proceed with completion of 6 weeks of antibiotics and then redo-root replacement. He was managed as an outpatient with [facility] and completed IV Cefazolin and Rifampin on 07/10/19.'  Had mech AVR 07/21/19 at [facility].

## 2019-12-09 NOTE — Unmapped (Signed)
1. Stop enoxaparin shots  2. Set phone reminder to take warfarin each day

## 2019-12-11 NOTE — Unmapped (Signed)
[facility] Refill Coordination Note    Specialty Medication(s) to be Shipped:   Infectious Disease: Biktarvy    Other medication(s) to be shipped: No additional medications requested for fill at this time     Warren [name]., DOB: [birth year]  Phone: [phone] (home)       All above HIPAA information was verified with patient.     Was a Nurse, learning disability used for this call? No    Completed refill call assessment today to schedule patient's medication shipment from the [facility] [phone]).       Specialty medication(s) and dose(s) confirmed: Regimen is correct and unchanged.   Changes to medications: Warren [name] reports no changes at this time.  Changes to insurance: No  Questions for the pharmacist: No    Confirmed patient received Welcome Packet with first shipment. The patient will receive a drug information handout for each medication shipped and additional FDA Medication Guides as required.       DISEASE/MEDICATION-SPECIFIC INFORMATION        N/A    SPECIALTY MEDICATION ADHERENCE     Medication Adherence    Patient reported X missed doses in the last month: 0  Specialty Medication: Biktarvy  Patient is on additional specialty medications: No                Biktarvy 50-200-25 mg: 10 days of medicine on hand          SHIPPING     Shipping address confirmed in Epic.     Delivery Scheduled: Yes, Expected medication delivery date: 12/22/19.     Medication will be delivered via UPS to the prescription address in Epic WAM.    Unk Lightning   [facility] Specialty Technician

## 2019-12-21 MED FILL — BIKTARVY 50 MG-200 MG-25 MG TABLET: 30 days supply | Qty: 30 | Fill #1 | Status: AC

## 2019-12-21 MED FILL — BIKTARVY 50 MG-200 MG-25 MG TABLET: ORAL | 30 days supply | Qty: 30 | Fill #1

## 2020-01-05 NOTE — Unmapped (Signed)
Encounter Date: 01/05/2020    Encounter Type: Phone Call    RN attempted to contact patient to checkin with patient to find out if he had any needs at this time. There was no answer at the time of the phone call, RN left VM requesting the patient to call back

## 2020-01-08 NOTE — Unmapped (Signed)
Error

## 2020-01-11 NOTE — Unmapped (Deleted)
Internal Medicine Clinic Visit    Reason for visit: ***    A/P:    ***    There are no diagnoses linked to this encounter.    No follow-ups on file.      __________________________________________________________    HPI:    Last visit 6/18.  __________________________________________________________    Problem List:  Patient Active Problem List   Diagnosis   ??? Acute bacterial endocarditis   ??? History of prosthetic aortic valve   ??? HIV (human immunodeficiency virus infection) (CMS-HCC)   ??? Bacteremia due to Staphylococcus aureus   ??? Tobacco use disorder   ??? AKI (acute kidney injury) (CMS-HCC)   ??? Severe aortic insufficiency   ??? HLD (hyperlipidemia)   ??? CVA (cerebral vascular accident) (CMS-HCC)   ??? Status post mechanical aortic valve replacement   ??? Thrombocytopenia (CMS-HCC)   ??? Other long term (current) drug therapy   ??? Leukocytosis   ??? Hx of repair of aortic root   ??? History of antiretroviral therapy   ??? Complete heart block (CMS-HCC)   ??? Closed displaced fracture of right femoral neck (CMS-HCC)   ??? Aortic valve endocarditis   ??? Acute postoperative pain   ??? Acute blood loss anemia       Medications:  Reviewed in EPIC  __________________________________________________________    Physical Exam:   Vital Signs:  There were no vitals filed for this visit.    Gen: Well appearing male/male sitting in chair in NAD.  HEENT: [place]/AT. Sclera anicteric. No thyromegaly. No cervical or supraclavicular LAD.  CV: RRR, no murmurs appreciated.  Pulm: Normal WOB. CTAB.  Abd: Soft, NTND.  Neuro: CN II-XII grossly intact.  Ext: No edema      Medication adherence and barriers to the treatment plan have been addressed. Opportunities to optimize healthy behaviors have been discussed. Patient / caregiver voiced understanding.        Jeffie Pollock, MD

## 2020-01-19 NOTE — Unmapped (Signed)
[facility] Refill Coordination Note    Specialty Medication(s) to be Shipped:   Infectious Disease: Biktarvy    Other medication(s) to be shipped: No additional medications requested for fill at this time     Warren [name]., DOB: 1975/11/16  Phone: [phone] (home)       All above HIPAA information was verified with patient.     Was a Nurse, learning disability used for this call? No    Completed refill call assessment today to schedule patient's medication shipment from the [facility] [phone]).       Specialty medication(s) and dose(s) confirmed: Regimen is correct and unchanged.   Changes to medications: Warren [name] reports no changes at this time.  Changes to insurance: No  Questions for the pharmacist: No    Confirmed patient received Welcome Packet with first shipment. The patient will receive a drug information handout for each medication shipped and additional FDA Medication Guides as required.       DISEASE/MEDICATION-SPECIFIC INFORMATION        N/A    SPECIALTY MEDICATION ADHERENCE     Medication Adherence    Patient reported X missed doses in the last month: 0  Specialty Medication: biktarvy 50-200-25mg                 biktarvy  : 5 days of medicine on hand         SHIPPING     Shipping address confirmed in Epic.     Delivery Scheduled: Yes, Expected medication delivery date: 12/30.     Medication will be delivered via UPS to the prescription address in Epic WAM.    Warren [name]   [facility] Specialty Technician

## 2020-01-20 MED FILL — BIKTARVY 50 MG-200 MG-25 MG TABLET: ORAL | 30 days supply | Qty: 30 | Fill #2

## 2020-01-20 MED FILL — BIKTARVY 50 MG-200 MG-25 MG TABLET: 30 days supply | Qty: 30 | Fill #2 | Status: AC

## 2020-02-15 NOTE — Unmapped (Signed)
Encounter Date: 02/15/2019    Encounter Type: Phone    Reason: RN attempted to contact patient to find out how the patient was doing with medication adherence. There was no answer at the time of the phone call. RN left VM requesting patient to call back. Pt. Has missed appointments at the ID clinic and Internal Medicine clinic.If patient calls back, he should be scheduled for an appointment with Varney Daily, FNP.    Duration: 5 minutes

## 2020-02-17 NOTE — Unmapped (Signed)
The [facility] has made a second and final attempt to reach this patient to refill the following medication:biktarvy.      We have left voicemails on the following phone numbers: [phone] .    Dates contacted: 1/21, 1/26  Last scheduled delivery: shipped 12/29    The patient may be at risk of non-compliance with this medication. The patient should call the [facility] at [phone] (option 4) to refill medication.    Westley Gambles   [facility] Specialty Technician

## 2020-02-29 NOTE — Unmapped (Signed)
Referral Services Note     Duration of Intervention: 10 min     REASON/TYPE OF CONTACT: Phone, Patient called this social worker asking for the number to the [employer] coordinator in his area.     ASSESSMENT: Patient reports that his situation has changed and will be in need of some services around housing. Patient given contact information of [employer] coordinator Erick Blinks in [place].  Patient also discussed that he does not have a follow up medical appointments and was advised to call [facility] to help refill prescription. Patient reported that he needed to take care of a couple of things but that he would reach out if he needed assistance. Patient also given number to [employer] medicaid transport to set up transportation to medical appointments.     INTERVENTION:  SW provided resource navigation for patient to get access to basic and medical needs.     PLAN:  SW will message case worker, Francisca December, to follow up with patient to get connected with care.     Moreen Piggott LCSW, LCAS  ID Clinic Social Work   Direct: [phone]

## 2020-03-14 NOTE — Unmapped (Signed)
RN attempted to contact patient for [facility] follow up, and to schedule an appointment  with provider. There was no answer at the time of the phone call. RN left VM to request patient to call back.

## 2020-03-19 NOTE — Unmapped (Signed)
[facility] Clinical Assessment & Refill Coordination Note    Warren Muzzy., DOB: [birth year]  Phone: [phone] (home)     All above HIPAA information was verified with patient.     Was a Nurse, learning disability used for this call? No    Specialty Medication(s):   Infectious Disease: Biktarvy     Current Outpatient Medications   Medication Sig Dispense Refill   ??? amoxicillin (AMOXIL) 500 MG capsule Take 2,000 mg by mouth once as needed. Prn as indicated by PCP for dental visits.     ??? aspirin (ECOTRIN) 81 MG tablet Take 81 mg by mouth daily. (Patient not taking: Reported on 01/27/2020)     ??? atorvastatin (LIPITOR) 40 MG tablet Take 40 mg by mouth.     ??? atorvastatin (LIPITOR) 40 MG tablet Take 1 tablet by mouth nightly.     ??? bictegrav-emtricit-tenofov ala (BIKTARVY) 50-200-25 mg tablet Take 1 tablet by mouth daily. 30 tablet 5   ??? metoprolol tartrate (LOPRESSOR) 25 MG tablet Take 25 mg by mouth every twelve (12) hours.      ??? metoprolol tartrate (LOPRESSOR) 25 MG tablet Take 25 mg by mouth every twelve (12) hours.     ??? oxyCODONE-acetaminophen (PERCOCET) 5-325 mg per tablet Take 1-2 tablets by mouth every eight (8) hours. (Patient not taking: Reported on 01/27/2020)     ??? potassium chloride (KLOR-CON) 20 MEQ CR tablet Take 20 mEq by mouth daily. (Patient not taking: Reported on 01/27/2020)     ??? warfarin (JANTOVEN) 2.5 MG tablet 7.5mg  (3 tabs) daily or as directed 90 tablet 5   ??? warfarin (JANTOVEN) 2.5 MG tablet Take 7.5 mg by mouth daily.       No current facility-administered medications for this visit.        Changes to medications: Augusto reports no changes at this time.    No Known Allergies    Changes to allergies: No    SPECIALTY MEDICATION ADHERENCE     Biktarvy   : 2 days of medicine on hand     Medication Adherence    Patient reported X missed doses in the last month: 0  Specialty Medication: Biktarvy  Patient is on additional specialty medications: No  Any gaps in refill history greater than 2 weeks in the last 3 months: yes  Demonstrates understanding of importance of adherence: yes  Informant: patient  Provider-estimated medication adherence level: good  Patient is at risk for Non-Adherence: Yes  The following intervention(s) were discussed with the patient: Other  Interventions discussed: I suggested that since he does not have a living situration that is conducive to him having the medication delivered, we can deliver to [facility] medication(s) dose(s) confirmed: Regimen is correct and unchanged.     Are there any concerns with adherence? Yes: patient will run out of medication and is not sure if he can get to [facility] to pick up a delivery    Adherence counseling provided? No counseling needed.  He said he will make an appointment with ID next week so he can get transportation to take him to [facility]    CLINICAL MANAGEMENT AND INTERVENTION      Clinical Benefit Assessment:    Do you feel the medicine is effective or helping your condition? Yes    HIV ASSOCIATED LABS:     Lab Results   Component Value Date/Time    HIVRS Not Detected 11/03/2019 02:01  PM    HIVRS Not Detected 09/18/2019 12:40 PM    HIVRS Not Detected 06/24/2019 02:05 PM    HIVCP <40 (H) 05/26/2019 01:46 PM    ACD4 399 (L) 09/18/2019 12:40 PM    ACD4 264 (L) 06/24/2019 02:05 PM       Clinical Benefit counseling provided? Labs from 11/03/19 show evidence of clinical benefit    Adverse Effects Assessment:    Are you experiencing any side effects? No    Are you experiencing difficulty administering your medicine? No    Quality of Life Assessment:    How many days over the past month did your HIV  keep you from your normal activities? For example, brushing your teeth or getting up in the morning. 0    Have you discussed this with your provider? Not needed    Therapy Appropriateness:    Is therapy appropriate? Yes, therapy is appropriate and should be continued    DISEASE/MEDICATION-SPECIFIC INFORMATION      N/A    PATIENT SPECIFIC NEEDS     - Does the patient have any physical, cognitive, or cultural barriers? Yes - He has no transportation or permanent housing    - Is the patient high risk? No    - Does the patient require a Care Management Plan? No     - Does the patient require physician intervention or other additional services (i.e. nutrition, smoking cessation, social work)? Yes - he is working with social workers in the ID Clinic to help with housing.      SHIPPING     Specialty Medication(s) to be Shipped:   Infectious Disease: Unable to schedule shipping until he contacts me on 03/21/20 to let me know if he is able to go to [place] to pick up the medication     Other medication(s) to be shipped: No additional medications requested for fill at this time     Changes to insurance: No    Delivery Scheduled: Not yet. Pending call from patient on 03/21/20     The patient will receive a drug information handout for each medication shipped and additional FDA Medication Guides as required.  Verified that patient has previously received a Conservation officer, historic buildings.    All of the patient's questions and concerns have been addressed.    [name]   [facility] Specialty Pharmacist

## 2020-03-22 NOTE — Unmapped (Signed)
Left a msg and sent text msg to schedule

## 2020-03-24 NOTE — Unmapped (Signed)
Called to see if patient is able to get to the [facility] pharmacy this week to pick up his Biktarvy so I can arrange for delivery.     Corliss Skains. [place], [place].D.  Specialty Pharmacist  [facility]  [phone] option 4
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
## 2020-04-01 MED FILL — BIKTARVY 50 MG-200 MG-25 MG TABLET: ORAL | 30 days supply | Qty: 30 | Fill #3

## 2020-04-01 NOTE — Unmapped (Signed)
Update:     I spoke to Mr. Pritt and he will receive his Biktarvy on Saturday, 04/02/20  via UPS.    Corliss Skains. [place], [place].D.  Specialty Pharmacist  [facility]  [phone] option 4

## 2020-04-01 NOTE — Unmapped (Signed)
Update:     Patient is now able to have his medication shipped to his home since he is now in a new apartment.  I called and left a voicemail yesterday and today to schedule delivery.    Corliss Skains. [place], [place].D.  Specialty Pharmacist  [facility]  [phone] option 4

## 2020-04-29 ENCOUNTER — Encounter: Admit: 2020-04-29 | Discharge: 2020-04-30 | Payer: MEDICAID

## 2020-04-29 ENCOUNTER — Encounter: Admit: 2020-04-29 | Discharge: 2020-04-30 | Payer: MEDICAID | Attending: Family | Primary: Family

## 2020-04-29 DIAGNOSIS — B20 Human immunodeficiency virus [HIV] disease: Principal | ICD-10-CM

## 2020-04-29 DIAGNOSIS — Z952 Presence of prosthetic heart valve: Principal | ICD-10-CM

## 2020-04-29 DIAGNOSIS — A630 Anogenital (venereal) warts: Principal | ICD-10-CM

## 2020-04-29 DIAGNOSIS — Z113 Encounter for screening for infections with a predominantly sexual mode of transmission: Principal | ICD-10-CM

## 2020-04-29 LAB — CBC W/ AUTO DIFF
BASOPHILS ABSOLUTE COUNT: 0 10*9/L (ref 0.0–0.1)
BASOPHILS RELATIVE PERCENT: 0.8 %
EOSINOPHILS ABSOLUTE COUNT: 0.1 10*9/L (ref 0.0–0.5)
EOSINOPHILS RELATIVE PERCENT: 2.2 %
HEMATOCRIT: 43.3 % (ref 39.0–48.0)
HEMOGLOBIN: 14.6 g/dL (ref 12.9–16.5)
LYMPHOCYTES ABSOLUTE COUNT: 1.5 10*9/L (ref 1.1–3.6)
LYMPHOCYTES RELATIVE PERCENT: 28.5 %
MEAN CORPUSCULAR HEMOGLOBIN CONC: 33.6 g/dL (ref 32.0–36.0)
MEAN CORPUSCULAR HEMOGLOBIN: 28.7 pg (ref 25.9–32.4)
MEAN CORPUSCULAR VOLUME: 85.5 fL (ref 77.6–95.7)
MEAN PLATELET VOLUME: 7.8 fL (ref 6.8–10.7)
MONOCYTES ABSOLUTE COUNT: 0.5 10*9/L (ref 0.3–0.8)
MONOCYTES RELATIVE PERCENT: 8.5 %
NEUTROPHILS ABSOLUTE COUNT: 3.2 10*9/L (ref 1.8–7.8)
NEUTROPHILS RELATIVE PERCENT: 60 %
PLATELET COUNT: 244 10*9/L (ref 150–450)
RED BLOOD CELL COUNT: 5.07 10*12/L (ref 4.26–5.60)
RED CELL DISTRIBUTION WIDTH: 13.3 % (ref 12.2–15.2)
WBC ADJUSTED: 5.3 10*9/L (ref 3.6–11.2)

## 2020-04-29 LAB — BASIC METABOLIC PANEL
ANION GAP: 4 mmol/L — ABNORMAL LOW (ref 5–14)
BLOOD UREA NITROGEN: 13 mg/dL (ref 9–23)
BUN / CREAT RATIO: 13
CALCIUM: 10.1 mg/dL (ref 8.7–10.4)
CHLORIDE: 109 mmol/L — ABNORMAL HIGH (ref 98–107)
CO2: 23.7 mmol/L (ref 20.0–31.0)
CREATININE: 1 mg/dL
EGFR CKD-EPI AA MALE: >90 mL/min/{1.73_m2} (ref >=60)
EGFR CKD-EPI NON-AA MALE: >90 mL/min/{1.73_m2} (ref >=60)
GLUCOSE RANDOM: 97 mg/dL (ref 70–179)
POTASSIUM: 4 mmol/L (ref 3.5–5.1)
SODIUM: 137 mmol/L (ref 135–145)

## 2020-04-29 LAB — ALT: ALT (SGPT): 50 U/L — ABNORMAL HIGH (ref 10–49)

## 2020-04-29 LAB — AST: AST (SGOT): 29 U/L (ref <=34)

## 2020-04-29 LAB — BILIRUBIN, TOTAL: BILIRUBIN TOTAL: 0.6 mg/dL (ref 0.3–1.2)

## 2020-04-29 LAB — PROTIME-INR
INR: 0.97
PROTIME: 11.4 s (ref 10.3–13.4)

## 2020-04-29 MED ORDER — BICTEGRAVIR 50 MG-EMTRICITABINE 200 MG-TENOFOVIR ALAFENAM 25 MG TABLET
ORAL_TABLET | Freq: Every day | ORAL | 5 refills | 30 days | Status: CP
Start: 2020-04-29 — End: ?
  Filled 2020-06-27: qty 30, 30d supply, fill #0
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
## 2020-04-30 LAB — LYMPH MARKER LIMITED,FLOW
ABSOLUTE CD3 CNT: 1005 {cells}/uL (ref 915–3400)
ABSOLUTE CD4 CNT: 345 {cells}/uL — ABNORMAL LOW (ref 510–2320)
ABSOLUTE CD8 CNT: 630 {cells}/uL (ref 180–1520)
CD3% (T CELLS): 67 % (ref 61–86)
CD4% (T HELPER): 23 % — ABNORMAL LOW (ref 34–58)
CD4:CD8 RATIO: 0.5 — ABNORMAL LOW (ref 0.9–4.8)
CD8% T SUPPRESR: 42 % — ABNORMAL HIGH (ref 12–38)

## 2020-05-02 DIAGNOSIS — Z7901 Long term (current) use of anticoagulants: Principal | ICD-10-CM

## 2020-05-02 DIAGNOSIS — B20 Human immunodeficiency virus [HIV] disease: Principal | ICD-10-CM

## 2020-05-02 DIAGNOSIS — Z952 Presence of prosthetic heart valve: Principal | ICD-10-CM

## 2020-05-02 LAB — TB MITOGEN: TB MITOGEN VALUE: 10

## 2020-05-02 LAB — QUANTIFERON TB GOLD PLUS
QUANTIFERON ANTIGEN 1 MINUS NIL: -0.27 [IU]/mL
QUANTIFERON ANTIGEN 2 MINUS NIL: -0.25 [IU]/mL
QUANTIFERON MITOGEN: 9.55 [IU]/mL
QUANTIFERON TB GOLD PLUS: NEGATIVE
QUANTIFERON TB NIL VALUE: 0.45 [IU]/mL

## 2020-05-02 LAB — TB AG2: TB AG2 VALUE: 0.2

## 2020-05-02 LAB — TB AG1: TB AG1 VALUE: 0.18

## 2020-05-02 LAB — TB NIL: TB NIL VALUE: 0.45

## 2020-05-02 LAB — SYPHILIS SCREEN: SYPHILIS RPR SCREEN: NONREACTIVE

## 2020-05-02 MED ORDER — ENOXAPARIN 100 MG/ML SUBCUTANEOUS SYRINGE
Freq: Every day | SUBCUTANEOUS | 3 refills | 20 days | Status: CP
Start: 2020-05-02 — End: ?

## 2020-05-02 MED ORDER — WARFARIN 2.5 MG TABLET
ORAL_TABLET | 5 refills | 0 days | Status: CP
Start: 2020-05-02 — End: ?

## 2020-05-02 NOTE — Unmapped (Signed)
Encounter Date: 04/29/2020    Encounter Type: Face to Face    Reason: Follow up    Assessment: Pt is in clinic for face to face visit with provider. His last in person appointment was 11/03/2019. Pt. Has had issues with housing and transportation that have made it difficult with medical appointment attendance. He missed 5-6 days of Biktarvy, due to transition in housing, as well as several doses of warfarin. Last INR 1.0 on  12/09/2019. Now that the patient has more stable housing, and with improvements in [place] transportation, patient states that he will attend more appointments. RN also discussed food access during visit. Pt. Reports that there are some days that he goes without food, because he cant afford it. Pt. Also has not seen his Cardiologist since last year.     Intervention: Discussed information about foods to avoid while taking Coumadin. Labs for his INR were drawn along with standard ID labs. RN provided patient the phone number and address to a food bank in his local area, and was also seen by clinic social worker for additional resources. RN will contact patient's Cardiologist to schedule and appointment.    Plan: - Schedule Follow up with Cardiology ( [name] and Dr. Tanda Rockers)            - Check INR            - Follow up with Resources provided by social work      Duration: 60 min

## 2020-05-02 NOTE — Unmapped (Signed)
Saw ID recently, INR obtained and was normalized around 1.  Has been out of warfarin for quite some time, starting to have numbness in legs and reported circulation problems.  H/o CVA in 2020, has mech AVR.  Will restart warfarin 7.5mg  daily (has never been therapeutic) + LMWH 100mg  BID, message sent to staff:  Ms. B,  Can you sched coag f/u same day as ID on 05/27/20?  Thanks    [employer],  Thanks again for reaching out on this patient.  Anticoagulation has been restarted including LMWH bridge.      Arby Barrette

## 2020-05-03 LAB — HIV RNA, QUANTITATIVE, PCR
HIV RNA LOG(10): 2.82 {Log_copies}/mL — ABNORMAL HIGH (ref <0.00)
HIV RNA QNT RSLT: DETECTED — AB
HIV RNA: 654 {copies}/mL — ABNORMAL HIGH (ref <0)

## 2020-05-03 NOTE — Unmapped (Signed)
called patient and patient stated he will call back to schedule

## 2020-05-05 NOTE — Unmapped (Signed)
The [facility] has made a fourth and final attempt to reach this patient to refill the following medication: Biktarvy.      We have left voicemails on the following phone numbers: [phone].    Dates contacted: 03/31, 04/04, 04/12, and 04/14  Last scheduled delivery: 04/02/20    The patient may be at risk of non-compliance with this medication. The patient should call the [facility] at [phone] (option 4) to refill medication.    [name]   [facility] Specialty Pharmacist

## 2020-05-06 NOTE — Unmapped (Signed)
RN was contacted by provider and shared services pharmacy stating that the patient has not responded to call attempts to set up delivery for Biktarvy. RN attempted to contact patient, but there was no answer. Left VM requesting the patient to contact pharmacy at [phone](option 4).

## 2020-05-09 NOTE — Unmapped (Signed)
RD called pt to screen for food insecurity based on [employer] and RW eligibility. RD left VM for pt to call back clinic for further questions.    Unk Pinto, MS, RD, LDN

## 2020-05-27 ENCOUNTER — Ambulatory Visit: Admit: 2020-05-27 | Payer: MEDICAID | Attending: Family | Primary: Family

## 2020-05-27 ENCOUNTER — Ambulatory Visit: Admit: 2020-05-27 | Payer: MEDICAID

## 2020-05-27 DIAGNOSIS — Z952 Presence of prosthetic heart valve: Principal | ICD-10-CM

## 2020-05-27 DIAGNOSIS — B20 Human immunodeficiency virus [HIV] disease: Principal | ICD-10-CM

## 2020-05-27 NOTE — Unmapped (Signed)
Referral Services Note     Duration of Intervention: 5 minutes    REASON/TYPE OF CONTACT: Text    ASSESSMENT: Provider Varney Daily requested I reach out to the Pt to see if he's intending to attend his scheduled ID visit today.     INTERVENTION:  Texted the Pt asking him to confirm his appointment or to let me know if I could assist in rescheduling.     PLAN:  Pt will confirm or reschedule.    Adalberto Ill, Darcey Nora, LCSW  [facility] Youth Social Worker  Direct: [phone]  Main ID: [phone]

## 2020-05-31 NOTE — Unmapped (Signed)
Called Pt and LVM to call to rescheduled  appt with provider scott.

## 2020-06-08 NOTE — Unmapped (Incomplete)
It was great to see you today in clinic. Here is the plan as we discussed:    - ***

## 2020-06-08 NOTE — Unmapped (Deleted)
DIVISION OF CARDIOLOGY  [facility] [place]        Date of Service: 06/09/2020    PCP: Referring Provider:   Idelia Salm, MD  [address] [place] [address]  [place] [address]  Phone: [phone]  Fax: [phone] Donetta Potts, MD  [address]  [place] [address]  [place],  [place] [address]  Phone: [phone]  Fax: [phone]     Assessment and Plan:     History of native and prosthetic AoV endocarditis: ***    The patient was seen and discussed with Dr. Jon Billings. Plan for follow-up in ***.    Subjective:     Reason for consultation: history of native and prosthetic AoV endocarditis     Brief HPI: 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 The patient is seen at the request of Donetta Potts for evaluation of endocarditis of both native and prosthetic aortic valve. He was admitted to the [facility] cardiology service from 5/3-5/11 with prosthetic AV endocarditis. SRS was consulted but deemed patient too high-risk for reoperation.   ??  He was subsequently referred to [facility] where he underwent a redo [place] with mechanical AVR (On-X) on 07/21/19. Plan was for 2 weeks of IV cefazolin post-surgery (end-date 7/14).   ??  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 He was discharged with IV antibiotics on 5/11 on nafcillin 2g q4 till 6/13 (6 weeks), Gentamicin 3 mg/kg/24 for 2 weeks (to end 5/22), and Rifampin 300 mg q8 till 6/13 (6 weeks). He was evaluated for high risk redo surgery??@ [facility] but referred to [facility] as he was considered too high risk. It was recommended to proceed with completion of 6 weeks of antibiotics and then redo-root replacement.????He was managed as an outpatient with [facility] and completed IV Cefazolin and Rifampin on 6/18. He was started on Keflex for suppressive therapy through surgery on 6/29.??His risk factor for these infections is not clear to me-denies IVDU.     ROS: 10 systems were reviewed and negative except as noted in HPI.    Cardiovascular history:  Infective endocarditis of the aortic valve complicated by aortic insufficient and root abscess (status post homograft in 02/2018)    Cardiac medications:  Metoprolol tartrate 25 mg BID  Coumadin 2.5 mg QHS  Pravastatin 80 mg QD    ECG in 05/2019: Atrial sensed and ventricular-paced rhythm - (personally reviewed)        TTE (05/26/19):   -S/P aortic homograft 2020. Paravalvular echo lucency measuring approximately 2cm wide as previously seen on TEE. It extends 180 degrees along the right border of the homograft. There appears to be dehiscence and rocking of the aortic homograft.  -There is mild to moderate regurgitation of the prosthetic aortic valve.  -The left ventricle is normal in size with normal wall thickness.  -  The left ventricular systolic function is mildly decreased, LVEF is visually estimated at 45-50%.  -There is decreased contractile function involving the apical segment(s).  -There is mild mitral valve regurgitation.  -The right ventricle is normal in size, with normal systolic function.  -There is no evidence of a significant pericardial effusion.    LHC: N/A    Stress test: N/A    Ambulatory monitor: N/A    Past medical history:  Patient Active Problem List   Diagnosis   ??? Acute bacterial endocarditis   ??? History of prosthetic aortic valve   ??? HIV (human immunodeficiency virus infection) (CMS-HCC)   ??? Bacteremia due to Staphylococcus aureus   ??? Tobacco use disorder   ??? AKI (acute kidney injury) (CMS-HCC)   ??? Severe aortic insufficiency   ??? HLD (hyperlipidemia)   ??? CVA (cerebral vascular accident) (CMS-HCC)   ??? Status post mechanical aortic valve replacement   ??? Thrombocytopenia (CMS-HCC)   ??? Other long term (current) drug therapy   ??? Leukocytosis   ??? Hx of repair of aortic root   ??? History of antiretroviral therapy   ??? Complete heart block (CMS-HCC)   ??? Closed displaced fracture of right femoral neck (CMS-HCC)   ??? Aortic valve endocarditis   ??? Acute postoperative pain   ??? Acute blood loss anemia       Medications:   Patient's Medications   New Prescriptions    No medications on file   Previous Medications    AMOXICILLIN (AMOXIL) 500 MG CAPSULE    Take 2,000 mg by mouth once as needed. Prn as indicated by PCP for dental visits.    ASPIRIN (ECOTRIN) 81 MG TABLET    Take 81 mg by mouth daily.     ATORVASTATIN (LIPITOR) 40 MG TABLET    Take 40 mg by mouth.    ATORVASTATIN (LIPITOR) 40 MG TABLET    Take 1 tablet by mouth nightly.    BICTEGRAV-EMTRICIT-TENOFOV ALA (BIKTARVY) 50-200-25 MG TABLET    Take 1 tablet by mouth daily.    ENOXAPARIN (LOVENOX) 100 MG/ML INJECTION    Inject 1 mL (100 mg total) under the skin daily.    METOPROLOL TARTRATE (LOPRESSOR) 25 MG TABLET    Take 25 mg by mouth every twelve (12) hours.     METOPROLOL TARTRATE (LOPRESSOR) 25 MG TABLET    Take 25 mg by mouth every twelve (12) hours.    OXYCODONE-ACETAMINOPHEN (PERCOCET) 5-325 MG PER TABLET    Take 1-2 tablets by mouth every eight (8) hours.     POTASSIUM CHLORIDE (KLOR-CON) 20 MEQ CR TABLET    Take 20 mEq by mouth daily.     WARFARIN (JANTOVEN) 2.5 MG TABLET    7.5mg  (3 tabs) daily or as directed   Modified Medications    No medications on file   Discontinued Medications    No medications on file       Allergies:  No Known Allergies    Social history:  He  reports that he has been smoking cigarettes. He has been smoking about 0.25 packs per day. He has never used smokeless tobacco. He reports current alcohol use of about 2.0 standard drinks of alcohol per week. He reports previous drug use. Drug: Marijuana.    Family history:  His family history includes Mental illness in his mother; No Known Problems in his brother, father, and sister.    Most recent labs:  Lab Results   Component Value Date    Sodium  137 04/29/2020    Potassium 4.0 04/29/2020    Chloride 109 (H) 04/29/2020    CO2 23.7 04/29/2020    BUN 13 04/29/2020    Creatinine 1.00 04/29/2020    Magnesium 2.2 06/02/2019     Lab Results   Component Value Date    HGB 14.6 04/29/2020    MCV 85.5 04/29/2020    Platelet 244 04/29/2020     Lab Results   Component Value Date    Cholesterol 134 05/25/2019    Triglycerides 256 (H) 05/25/2019    HDL 29 (L) 05/25/2019    Non-HDL Cholesterol 105 05/25/2019    LDL Calculated 54 (L) 05/25/2019    Hemoglobin A1C <4.0 (L) 07/10/2019    INR, POC 1.0 12/09/2019    INR 0.97 04/29/2020        Objective:      There were no vitals taken for this visit.   Wt Readings from Last 3 Encounters:   04/29/20 (!) 101.5 kg (223 lb 12.8 oz)   11/03/19 97 kg (213 lb 12.8 oz)   10/08/19 94.8 kg (209 lb)       Physical Exam:  GEN: well-appearing male in no acute distress  CV: normal rate and regular rhythm  PULM: CTAB, no increased work of breathing on room air  ABD: soft and nontender  EXT: warm with intact distal pulses and no peripheral edema  NEURO: alert and oriented x4

## 2020-06-15 NOTE — Unmapped (Signed)
Encounter Date: 06/15/2020    Encounter Type: Phone    Assessment: RN attempted to contact patient to assess medication adherence. There was no answer at the time, and call went directly to voicemail.     Intervention: Left VM requesting patient return call.    Duration: 5 min

## 2020-06-27 NOTE — Unmapped (Addendum)
[facility] Clinical Assessment & Refill Coordination Note    Warren Keltz., DOB: 11-04-1975  Phone: [phone] (home)     All above HIPAA information was verified with patient.     Was a Nurse, learning disability used for this call? No    Specialty Medication(s):   Infectious Disease: Biktarvy     Current Outpatient Medications   Medication Sig Dispense Refill   ??? amoxicillin (AMOXIL) 500 MG capsule Take 2,000 mg by mouth once as needed. Prn as indicated by PCP for dental visits.     ??? aspirin (ECOTRIN) 81 MG tablet Take 81 mg by mouth daily.  (Patient not taking: Reported on 05/03/2020)     ??? atorvastatin (LIPITOR) 40 MG tablet Take 40 mg by mouth. (Patient not taking: Reported on 05/03/2020)     ??? atorvastatin (LIPITOR) 40 MG tablet Take 1 tablet by mouth nightly. (Patient not taking: Reported on 05/03/2020)     ??? bictegrav-emtricit-tenofov ala (BIKTARVY) 50-200-25 mg tablet Take 1 tablet by mouth daily. 30 tablet 5   ??? enoxaparin (LOVENOX) 100 mg/mL injection Inject 1 mL (100 mg total) under the skin daily. 20 mL 3   ??? metoprolol tartrate (LOPRESSOR) 25 MG tablet Take 25 mg by mouth every twelve (12) hours.      ??? metoprolol tartrate (LOPRESSOR) 25 MG tablet Take 25 mg by mouth every twelve (12) hours.     ??? oxyCODONE-acetaminophen (PERCOCET) 5-325 mg per tablet Take 1-2 tablets by mouth every eight (8) hours.  (Patient not taking: Reported on 05/03/2020)     ??? potassium chloride (KLOR-CON) 20 MEQ CR tablet Take 20 mEq by mouth daily.      ??? warfarin (JANTOVEN) 2.5 MG tablet 7.5mg  (3 tabs) daily or as directed 90 tablet 5     No current facility-administered medications for this visit.        Changes to medications: Eddison reports no changes at this time.    No Known Allergies    Changes to allergies: No    SPECIALTY MEDICATION ADHERENCE     Biktarvy   : 2 days of medicine on hand       Medication Adherence    Patient reported X missed doses in the last month: 2  Specialty Medication: Biktarvy  Patient is on additional specialty medications: No  Any gaps in refill history greater than 2 weeks in the last 3 months: yes  Demonstrates understanding of importance of adherence: no  Informant: patient  Provider-estimated medication adherence level: variable  Patient is at risk for Non-Adherence: Yes  The following intervention(s) were discussed with the patient: Pill boxes, Alarm Clock  Reasons for non-adherence: patient forgets          Specialty medication(s) dose(s) confirmed: Regimen is correct and unchanged.     Are there any concerns with adherence? Yes: has missed unknown number of doses.  We last filled his medication on 04/01/20.    HIV ASSOCIATED LABS:     Lab Results   Component Value Date/Time    HIVRS Detected (A) 04/29/2020 04:23 PM    HIVRS Not Detected 11/03/2019 02:01 PM    HIVRS Not Detected 09/18/2019 12:40 PM    HIVCP 654 (H) 04/29/2020 04:23 PM    HIVCP <40 (H) 05/26/2019 01:46 PM    ACD4 345 (L) 04/29/2020 04:23 PM    ACD4 399 (L) 09/18/2019 12:40 PM    ACD4 264 (L) 06/24/2019 02:05 PM       Adherence  counseling provided? Yes: I discussed the use of both an alarm to ring at the same time every day and a pill box to help him remember to take the medication.    CLINICAL MANAGEMENT AND INTERVENTION      Clinical Benefit Assessment:    Do you feel the medicine is effective or helping your condition? Yes    Clinical Benefit counseling provided? Not needed    Adverse Effects Assessment:    Are you experiencing any side effects? No    Are you experiencing difficulty administering your medicine? No    Quality of Life Assessment:    How many days over the past month did your HIV  keep you from your normal activities? For example, brushing your teeth or getting up in the morning. 0    Have you discussed this with your provider? Not needed    Acute Infection Status:    Acute infections noted within Epic:  No active infections  Patient reported infection: None    Therapy Appropriateness:    Is therapy appropriate? Yes, therapy is appropriate and should be continued    DISEASE/MEDICATION-SPECIFIC INFORMATION      N/A    PATIENT SPECIFIC NEEDS     - Does the patient have any physical, cognitive, or cultural barriers? No    - Is the patient high risk? No    - Does the patient require a Care Management Plan? No     - Does the patient require physician intervention or other additional services (i.e. nutrition, smoking cessation, social work)? No      SHIPPING     Specialty Medication(s) to be Shipped:   Infectious Disease: Biktarvy    Other medication(s) to be shipped: No additional medications requested for fill at this time     Changes to insurance: No    Delivery Scheduled: Yes, Expected medication delivery date: 06/28/20.     Medication will be delivered via UPS to the confirmed prescription address in [facility].    The patient will receive a drug information handout for each medication shipped and additional FDA Medication Guides as required.  Verified that patient has previously received a Conservation officer, historic buildings and a Surveyor, mining.    The patient or caregiver noted above participated in the development of this care plan and knows that they can request review of or adjustments to the care plan at any time.      All of the patient's questions and concerns have been addressed.    [name]   [facility] Specialty Pharmacist

## 2020-07-21 NOTE — Unmapped (Signed)
Referral Service Attempt    Duration of Intervention: 5 minutes    TYPE OF CONTACT: Text    SW attempted to follow up on provider referral for Medication Access Concerns    [facility] has been trying to reach the Pt via their phone number and has not been able to get through or leave a message. Dr. Lyda Perone requested this SW text the Pt to ask them to call shared services.    This SW texted the Pt: Please call the [facility] at [phone] (option 4) to refill your medication.      Lyman Speller, Darcey Nora, LCSW  [facility] Social Worker  Direct: [phone]  Main ID: [phone]

## 2020-07-29 ENCOUNTER — Ambulatory Visit: Admit: 2020-07-29 | Payer: MEDICAID | Attending: Family | Primary: Family

## 2020-07-29 NOTE — Unmapped (Signed)
Encounter Date: 07/29/2020  ??  Encounter Type: Phone  ??  Assessment: RN attempted to contact patient to find out if he was coming to his appt today, 7/8 @1 :30pm  ??  Intervention:  Recording states  the subscriber you have dialed is not in service. RN is not able to leave a message.    Duration: 1 min
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
## 2020-08-01 NOTE — Unmapped (Signed)
The [facility] has made a third and final attempt to reach this patient to refill the following medication: Biktarvy.      We have been unable to leave messages on the following phone numbers: [phone].  Patient is not signed up for myChart.  I emailed him asking him to call back with no success..    Dates contacted: 06/29, 07/05, and 07/11  Last scheduled delivery: 06/28/20    The patient may be at risk of non-compliance with this medication. The patient should call the [facility] at [phone] (option 4) to refill medication.    [name]   [facility] Specialty Pharmacist

## 2020-08-25 ENCOUNTER — Encounter: Admit: 2020-08-25 | Discharge: 2020-08-25 | Payer: MEDICAID

## 2020-08-25 ENCOUNTER — Encounter: Admit: 2020-08-25 | Discharge: 2020-08-25 | Payer: MEDICAID | Attending: Family | Primary: Family

## 2020-08-25 DIAGNOSIS — Z952 Presence of prosthetic heart valve: Principal | ICD-10-CM

## 2020-08-25 DIAGNOSIS — A630 Anogenital (venereal) warts: Principal | ICD-10-CM

## 2020-08-25 DIAGNOSIS — I639 Cerebral infarction, unspecified: Principal | ICD-10-CM

## 2020-08-25 DIAGNOSIS — B20 Human immunodeficiency virus [HIV] disease: Principal | ICD-10-CM

## 2020-08-25 DIAGNOSIS — R030 Elevated blood-pressure reading, without diagnosis of hypertension: Principal | ICD-10-CM

## 2020-08-25 LAB — CBC W/ AUTO DIFF
BASOPHILS ABSOLUTE COUNT: 0.1 10*9/L (ref 0.0–0.1)
BASOPHILS RELATIVE PERCENT: 1 %
EOSINOPHILS ABSOLUTE COUNT: 0.2 10*9/L (ref 0.0–0.5)
EOSINOPHILS RELATIVE PERCENT: 2.6 %
HEMATOCRIT: 44 % (ref 39.0–48.0)
HEMOGLOBIN: 14.4 g/dL (ref 12.9–16.5)
LYMPHOCYTES ABSOLUTE COUNT: 2 10*9/L (ref 1.1–3.6)
LYMPHOCYTES RELATIVE PERCENT: 33.5 %
MEAN CORPUSCULAR HEMOGLOBIN CONC: 32.9 g/dL (ref 32.0–36.0)
MEAN CORPUSCULAR HEMOGLOBIN: 29.2 pg (ref 25.9–32.4)
MEAN CORPUSCULAR VOLUME: 89 fL (ref 77.6–95.7)
MEAN PLATELET VOLUME: 7.3 fL (ref 6.8–10.7)
MONOCYTES ABSOLUTE COUNT: 0.5 10*9/L (ref 0.3–0.8)
MONOCYTES RELATIVE PERCENT: 8.4 %
NEUTROPHILS ABSOLUTE COUNT: 3.3 10*9/L (ref 1.8–7.8)
NEUTROPHILS RELATIVE PERCENT: 54.5 %
NUCLEATED RED BLOOD CELLS: 0 /100{WBCs} (ref <=4)
PLATELET COUNT: 260 10*9/L (ref 150–450)
RED BLOOD CELL COUNT: 4.94 10*12/L (ref 4.26–5.60)
RED CELL DISTRIBUTION WIDTH: 12.8 % (ref 12.2–15.2)
WBC ADJUSTED: 6 10*9/L (ref 3.6–11.2)

## 2020-08-25 LAB — BASIC METABOLIC PANEL
ANION GAP: 5 mmol/L (ref 5–14)
BLOOD UREA NITROGEN: 11 mg/dL (ref 9–23)
BUN / CREAT RATIO: 10
CALCIUM: 9.1 mg/dL (ref 8.7–10.4)
CHLORIDE: 106 mmol/L (ref 98–107)
CO2: 25.9 mmol/L (ref 20.0–31.0)
CREATININE: 1.07 mg/dL
EGFR CKD-EPI (2021) MALE: 89 mL/min/{1.73_m2} (ref >=60)
GLUCOSE RANDOM: 83 mg/dL (ref 70–179)
POTASSIUM: 4 mmol/L (ref 3.4–4.8)
SODIUM: 137 mmol/L (ref 135–145)

## 2020-08-25 LAB — URINALYSIS WITH CULTURE REFLEX
BACTERIA: NONE SEEN /HPF
BILIRUBIN UA: NEGATIVE
BLOOD UA: NEGATIVE
GLUCOSE UA: NEGATIVE
KETONES UA: NEGATIVE
LEUKOCYTE ESTERASE UA: NEGATIVE
NITRITE UA: NEGATIVE
PH UA: 5.5 (ref 5.0–9.0)
PROTEIN UA: NEGATIVE
RBC UA: 1 /HPF (ref 0–3)
SPECIFIC GRAVITY UA: 1.02 (ref 1.005–1.030)
SQUAMOUS EPITHELIAL: 1 /HPF (ref 0–5)
UROBILINOGEN UA: 0.2
WBC UA: 0 /HPF (ref 0–3)

## 2020-08-25 LAB — LIPID PANEL
CHOLESTEROL/HDL RATIO SCREEN: 6 — ABNORMAL HIGH (ref 1.0–4.5)
CHOLESTEROL: 167 mg/dL (ref <=200)
HDL CHOLESTEROL: 28 mg/dL — ABNORMAL LOW (ref 40–60)
NON-HDL CHOLESTEROL: 139 mg/dL — ABNORMAL HIGH (ref 70–130)
TRIGLYCERIDES: 561 mg/dL — ABNORMAL HIGH (ref 0–150)

## 2020-08-25 LAB — HEMOGLOBIN A1C
ESTIMATED AVERAGE GLUCOSE: 88 mg/dL
HEMOGLOBIN A1C: 4.7 % — ABNORMAL LOW (ref 4.8–5.6)

## 2020-08-25 LAB — ALT: ALT (SGPT): 26 U/L (ref 10–49)

## 2020-08-25 LAB — BILIRUBIN, TOTAL: BILIRUBIN TOTAL: 0.5 mg/dL (ref 0.3–1.2)

## 2020-08-25 LAB — AST: AST (SGOT): 17 U/L (ref <=34)

## 2020-08-25 MED ORDER — METOPROLOL TARTRATE 25 MG TABLET
ORAL_TABLET | Freq: Two times a day (BID) | ORAL | 3 refills | 90 days | Status: CP
Start: 2020-08-25 — End: 2021-08-25

## 2020-08-25 MED ORDER — PODOFILOX 0.5 % TOPICAL GEL
Freq: Two times a day (BID) | TOPICAL | 0 refills | 0 days | Status: CP
Start: 2020-08-25 — End: ?
  Filled 2020-08-31: qty 3.5, 30d supply, fill #0

## 2020-08-25 MED ORDER — ATORVASTATIN 40 MG TABLET
ORAL_TABLET | Freq: Every day | ORAL | 3 refills | 90.00000 days | Status: CP
Start: 2020-08-25 — End: ?

## 2020-08-25 MED ORDER — BICTEGRAVIR 50 MG-EMTRICITABINE 200 MG-TENOFOVIR ALAFENAM 25 MG TABLET
ORAL_TABLET | Freq: Every day | ORAL | 1 refills | 90.00000 days | Status: CP
Start: 2020-08-25 — End: ?
  Filled 2020-08-29: qty 60, 60d supply, fill #0

## 2020-08-25 NOTE — Unmapped (Signed)
Referral Services Note     Duration of Intervention: 5 minutes    REASON/TYPE OF CONTACT: Phone    ASSESSMENT: Pt was referred by provider Varney Daily as he had expressed interest in mental health counseling at visit on Tuesday.     Upon calling pt he stated that he was a 3 out of 10 in terms of motivation for treatment and would only engage if life got more stressful for him. He feels he can handle the stress right now. He denies immediate referral to mental health.     INTERVENTION:  Provided motivational interviewing and education about mental health resources. Updated provider that pt is not interested in care right now.     PLAN:  Pt will call if he does want a referral to counseling.      Bradly Bienenstock LCSW, CHES  [facility] Lead Social Worker  Direct: [phone]  Main ID: [phone]

## 2020-08-25 NOTE — Unmapped (Signed)
INR    1.0      SEC   12.0

## 2020-08-25 NOTE — Unmapped (Signed)
**  Start taking warfarin in the mornings with your other medicines, focus on taking each day please

## 2020-08-25 NOTE — Unmapped (Signed)
[facility]IN[facility]  [address]  [place], [place]  [address]  P [phone]  F [phone]     Primary care provider: Idelia Salm, MD    Assessment/Plan:      HIV (dx'd 2008)  - chronic, stable   ?? According to notes, patient was diagnosed in 2008 after routine STI testing.   ?? Denies prior opportunistic infections.  ?? Has been non adherent with ART in the past. Last took [place].  ?? 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  ?? Patient had been out of care since moving to [place] from [place] in 06/2018.  ?? In Medical Case Management with IExcell Seltzer, RN in light of complex medical history  ?? Has not been coming to appointments due to limitations in transportation.    Overall doing well. Current regimen: Biktarvy (BIC/FTC/TAF)  Misses doses of ARVs rarely    Med access via Medicaid  CD4 count 200's-300's  Discussed ARV adherence and taking ARVs with food    Lab Results   Component Value Date    ACD4 345 (L) 04/29/2020    CD4 23 (L) 04/29/2020    HIVRS Detected (A) 04/29/2020    HIVCP 654 (H) 04/29/2020     ??? CD4, HIV RNA, and safety labs (full return panel)  ??? Continue current therapy. On Biktarvy  ??? Encouraged continued excellent ARV adherence      Genital warts  - chronic, stable  Diagnosed in [place]. Evaluated with previous PCP in [place], first treated with cryotherapy and then Condylox without relief.   Seen by Dr. Neysa Hotter who agreed with need for surgical intervention and biopsy. Seen by pre-op clinical for evaluation in 09/2019.  Will ask I. Baker to help facilitate another appointment with Dr. Neysa Hotter to again address patient's genital warts.  ?? Found an old bottle of Condylox and started using on some anal lesions and warts started to go away. Would like to have more Condylox.  ?? On exam, noted large genital wart to 6:00 position of anus and multiple other smaller warts. Would benefit from surgical intervention. I will notify patient that he needs to follow up with Dr. Neysa Hotter as he has established care with him and will give him Condylox for smaller lesions.      Mental Health - chronic, stable  Patient came to [facility] from [place] in mid 2020. Started having complex medical issues that lead to major heart surgery  Had girlfriend he somewhat depended on during this time but now that relationship has ended under bad circumstances.  He has moved into another apartment but has no transportation besides Medicaid transport.  ?? He is interested in therapy.  ?? Is at peace, sees ex-girlfriend sometimes casually  ?? Not interested in support group      Hyperlipidemia - chronic, progressive  Patient had been taking lipitor but had run out.  ?? Refilled today by PCP  ?? 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 On 4/17 patient presented to OSH [facility]) with fevers/chillls/weakness with MSSA cultures positive. TEE on 4/21 showed a 2x2 cm vegetation on the non-coronary cusp on the aortic valve with mild-mod AI.  ?? Treated with Vancomycin and Cefepime, then narrowed to Cefazolin.  ?? Per discharge summary on 06/02/19:   CTA on 05/18/19 showed??a??contained rupture or pseudoaneurysm of the aortic  outflow tract??measuring 4.2 x 3.0 cm??proximal to the anterior leaflet of the aortic valve with possible aortic root thrombus.??Previous TEE from OSH 4/27 showed EF ??60-65% with severe AI. Pacemaker evaluation showed Medtronic Azure Dual Chamber Pacemaker, EKG &??telemetry demonstrating a-sensed, v-paced rhythm. Repeat TTE 5/4 showed Paravalvular echo lucency measuring approximately 2cm wide as previously seen on TEE. It extends 180 degrees along the right border of the homograft. There appears to be dehiscence and rocking of the aortic homograft. His status is tenuous with extremely high risk of decompensation &??death should the graft fail.   ?? Transferred to [facility] on 05/26/19, antibiotics changed to [name] 2 g q 4h x 6 weeks (05/26/19-07/05/19), Gentamicin x 2 weeks (05/26/19-06/13/19, completed) and Rifampin 300mg  q8h x 6 weeks (05/26/19-07/05/19)  ?? Underwent redo ASCENDING AORTA GRAFT, WITH CARDIOPULMONARY BYPASS, WITH AORTIC ROOT REPLACEMENT USING VALVED CONDUIT AND CORONARY RECONSTRUCTION (EG,BENTALL), redo sternotomy s/p Root replacement Feb 2020 with [name] at [facility], 07/21/19.  ?? Is supposed to be on anticogaulation with Warfarin. INR goal of 2-3.  ?? Patient has not been able to keep INR appointments due to unreliable transportation.  ?? R. Scott, [place], has been following patient for INR, he will see him today.   ?? Patient was supposed to have appointment with [name] for follow up, CT scan, and follow up echocardiogram in 01/2020, but did not end up keeping the appointment.      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 Summary below:  ?? 03/20/18: S/p aortic root replacement with homograft 25mm, reconstruction of aortomitral continuity, repair of right atrial fistula, mitral valve repair, Epicardial pacemaker placed for bradycardia   ?? History of bicuspid aortic valve with mod-severe AI  ?? AV vegetations found on TEE, blood cultures grew S. Pneumoniae  ?? Treated initially with Vancomycin and Ceftriaxone x 2 weeks to cover for possible meningitis as well and discharged to rehab on Ceftriaxone 2g IV daily x 4 weeks.   ?? No chest discomfort, SOB  ?? Needs follow up with [name] with [facility]      Nicotine dependence  - chronic, stable  Smoker, 0.5 PPD x 20 years   5-6 cigs/day  ??? Revisit readiness at next appt. Not interested in quitting.      Sexual health & secondary prevention  - abstinent  Not in relationship.   Parts of body used during sex include: mouth and penis. Does not have anal sex. Gives and receives oral sex. Insertive partner for vaginal sex.   Since last visit has had no sex and has not had add'l STI screening.  He sometimes uses condom for vaginal sex  He does routinely discuss HIV status with partner(s).  Have not discussed interest in having children.    Lab Results   Component Value Date    RPR Nonreactive 04/29/2020    CTNAA Negative 04/29/2020    CTNAA Negative 06/23/2019    GCNAA Negative 04/29/2020    GCNAA Negative 06/23/2019    SPECSOURCE Urine (Male) 04/29/2020    SPECSOURCE Urine 06/23/2019     ??? GC/CT NAATs - patient declined screening  ??? RPR - needed but deferred to future visit  Health maintenance  - chronic, stable  PCP: Dr. Johnsie Kindred with [facility].    Oral health  He does  have a dentist. Last dental exam 2022.    Eye health  He does not use corrective lenses. Last eye exam unknown.    Metabolic conditions  Wt Readings from Last 5 Encounters:   08/25/20 96.7 kg (213 lb 3.2 oz)   04/29/20 (!) 101.5 kg (223 lb 12.8 oz)   11/03/19 97 kg (213 lb 12.8 oz)   10/08/19 94.8 kg (209 lb)   10/01/19 94.8 kg (209 lb)     Lab Results   Component Value Date    CREATININE 1.07 08/25/2020    GLUCOSEU Negative 08/25/2020    GLU 83 08/25/2020    A1C 4.7 (L) 08/25/2020    ALT 26 08/25/2020    ALT 50 (H) 04/29/2020    ALT 32 09/18/2019     # Kidney health - creatinine and UA today  # Bone health -      - 40-50 YO: OPTIMIZE Ca, VIT D, EXERCISE & CALCULATE FRAX SCORE  # Diabetes assessment - random glucose today  # NAFLD assessment - suspicion for NAFLD low    Communicable diseases  Lab Results   Component Value Date    QFTTBGOLD Negative 04/29/2020    HEPBSAB Nonreactive 06/24/2019    HEPCAB Nonreactive 07/10/2019     # TB screening - no longer needed; negative IGRA, low risk 04/29/20  # Hepatitis screening - As noted: 05/12/19 Hep A IgM NR  05/12/19 Hep B C IgM NR  # MMR screening - not assessed    Cancer screening  No results found for: PSASCRN, PSA, PAP, FINALDX  # Anorectal - not yet done  # Colorectal - screening not indicated  # Liver - no screening indicated  # Lung - screening not indicated  # Prostate - screening not indicated    Cardiovascular disease  Lab Results   Component Value Date    CHOL 167 08/25/2020    HDL 28 (L) 08/25/2020    LDL  08/25/2020      Comment:      NHLBI Recommended Ranges, LDL Cholesterol, for Adults (20+yrs) (ATPIII), mg/dL  Optimal              <295  Near Optimal        100-129  Borderline High     130-159  High                160-189  Very High            >=190  NHLBI Recommended Ranges, LDL Cholesterol, for Children (2-19 yrs), mg/dL  Desirable            <621  Borderline High     110-129  High                 >=130    Unable to calculate due to triglyceride greater than 400 mg/dL.  Unable to calculate due to triglyceride greater than 400 mg/dL.  Unable to calculate due to triglyceride greater than 400 mg/dL.    NONHDL 139 (H) 08/25/2020    TRIG 561 (H) 08/25/2020     # The ASCVD Risk score Denman George DC Jorge Ny al., 2013) failed to calculate.  - is not taking aspirin   - is taking statin (needs to restart)  - BP control good  - current smoker  # AAA screening - no indication for  screening    Immunization History   Administered Date(s) Administered   ??? COVID-19 VACC,MRNA,(PFIZER)(PF)(IM) 08/13/2019, 11/03/2019, 04/29/2020   ??? COVID-19 VACCINE, MRNA(MODERNA)(BOOSTER)(PF) 08/25/2020   ??? Pneumococcal Conjugate 20-valent 08/25/2020     ??? Immunizations today - PCV-20, CoVid booster      Counseling services took more than 50% of today's visit.  I personally spent 30 minutes face-to-face and non-face-to-face in the care of this patient, which includes all pre, intra, and post visit time on the date of service.      Disposition  Next appointment: 3 months      To do @ next RTC  ??? Discuss anal pap    Varney Daily, FNP-BC  [facility] Infectious Diseases Clinic at [facility]  [address], [place], [place] [address]    Phone: [phone]   Fax: [phone]             Subjective      Chief Complaint   HIV follow up    HPI  In addition to details in A&P above:  ?? Doing well overall. No complaints.  Denies any fever, chills, nausea, vomiting, rash, urinary complaints, diarrhea, constipation.  Mental Health stable.  Still trying to move closer to [facility].  Still having unreliable medical transportation in [place], [place]  Would like to address genital warts.      Past Medical History:   Diagnosis Date   ??? Bicuspid aortic valve     mod-severe AI   ??? Endocarditis 02/2018    S. Pneumoniae, Tx Vancomycin and Ceftriaxone x 2 weeks, then Ceftriaxone 2g daily x 4 weeks   ??? 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 05/26/19 [name] 12g continuous infusion+Rifampin 300mg  Q8H, 06/25/19 Change to Cefazolin 2g Q8H+Rifampin, TTE 5/4 showed Paravalvular echo lucency measuring approximately 2cm wide as previously seen on TEE. It extends 180 degrees along the right border of the homograft. Appears to be dehiscence and rocking of the aortic homograft.   ??? HIV disease (CMS-HCC) 2008    Dx. in [place]. Denies OI. History of nonadherence to ART. Last on Symtuza. Started on Dolutegravir 50mg  BID and Truvada daily.   ??? Myocardial infarction (CMS-HCC)     Feb 2020   ??? Stroke (CMS-HCC)          Social History  Background - Born and raised in [place], [place]. Moved to [place] with friend.    Housing - in apartment by himself  School / Work & Benefits - on disability    Social History     Tobacco Use   ??? Smoking status: Current Every Day Smoker     Packs/day: 0.25     Types: Cigarettes   ??? Smokeless tobacco: Never Used   ??? Tobacco comment: 4 cigarettes per day   Vaping Use   ??? Vaping Use: Every day   Substance Use Topics   ??? Alcohol use: Yes     Alcohol/week: 2.0 standard drinks     Types: 2 Cans of beer per week     Comment: daily   ??? Drug use: Not Currently     Types: Marijuana     Comment: Rare use         Review of Systems  As per HPI. All others negative.      Medications and Allergies  He has a current medication list which includes the following prescription(s): bictegrav-emtricit-tenofov ala, warfarin, amoxicillin, aspirin, atorvastatin, metoprolol tartrate, metoprolol tartrate, oxycodone-acetaminophen,  podofilox, and potassium chloride.    Allergies: Patient has no known allergies.      Family History  His family history includes Mental illness in his mother; No Known Problems in his brother, father, and sister.           Objective      BP 121/80  - Pulse 91  - Temp 36.6 ??C (97.9 ??F) (Oral)  - Ht 182.9 cm (6')  - Wt 96.7 kg (213 lb 3.2 oz)  - BMI 28.92 kg/m??      Const ??? WDWN, NAD, non-toxic appearance    Eyes ??? lids normal bilaterally, conjunctiva anicteric and noninjected OU   ??? PERRL    ENMT ??? normal appearance of external nose and ears, no nasal discharge   ??? OP clear    Neck ??? neck of normal appearance and trachea midline   ??? no thyromegaly, nodules, or tenderness    Lymph ??? no LAD in neck    CV ??? RRR, no m/r/g, S1/S2, mechanical click noted from valve replacement   ??? no peripheral edema, WWP    Resp ??? normal WOB   ??? on RA, no breathlessness with speaking, no coughing, CTAB    GI ??? normal inspection, NTND, NABS   ??? no umbilical hernia on exam    GU ??? deferred   MSK ??? no clubbing or cyanosis of hands   ??? no focal tenderness or abnormalities of joints of RUE, LUE, RLE, or LLE    Skin ??? no rashes, lesions, or ulcers of visualized skin   ??? no nodules or areas of induration of palpated skin    Neuro ??? CNs II-XII grossly intact   ??? sensation to light touch grossly intact throughout    Psych ??? appropriate affect   ??? oriented to person, place, time

## 2020-08-25 NOTE — Unmapped (Signed)
Assessment/Plan:     Current warfarin Dose:  7.5mg  daily  Steady State:no  Date INR: August 25, 2020  Goal INR: 2-3  INR Results: 1.0  Lab Results   Component Value Date    INR, POC 1.0 08/25/2020    INR, POC 1.0 12/09/2019    INR, POC 1.1 10/27/2019    INR 0.97 04/29/2020       ASSESSMENT/PLAN:  Plan for Warfarin Dose:  continue current dose    1. INR subtherapeutic, nonadherent.  Change to taking warfarin in am w/ other meds to help w/ adherence, reassess at [facility].  No longer on LMWH bridge  2. Refills for atorvastatin and metoprolol   3. H/o elevated BP - at goal, CTM    Repeat INR Date:  3 wks    Subject/Objective:     Patient: Warren [name]. 45 y.o.    Medical Record Number:  [id number]    PRIMARY CARE PHYSICIAN: Idelia Salm, MD    GH:[id number] mechanical heart valve, BP    PMHx:   Patient Active Problem List   Diagnosis   ??? Acute bacterial endocarditis   ??? History of prosthetic aortic valve   ??? HIV (human immunodeficiency virus infection) (CMS-HCC)   ??? Bacteremia due to Staphylococcus aureus   ??? Tobacco use disorder   ??? AKI (acute kidney injury) (CMS-HCC)   ??? Severe aortic insufficiency   ??? HLD (hyperlipidemia)   ??? CVA (cerebral vascular accident) (CMS-HCC)   ??? Status post mechanical aortic valve replacement   ??? Thrombocytopenia (CMS-HCC)   ??? Other long term (current) drug therapy   ??? Leukocytosis   ??? Hx of repair of aortic root   ??? History of antiretroviral therapy   ??? Complete heart block (CMS-HCC)   ??? Closed displaced fracture of right femoral neck (CMS-HCC)   ??? Aortic valve endocarditis   ??? Acute postoperative pain   ??? Acute blood loss anemia          Your Medication List          Accurate as of August 25, 2020 11:53 AM. If you have any questions, ask your nurse or doctor.            STOP taking these medications    enoxaparin 100 mg/mL injection  Commonly known as: LOVENOX  Stopped by: Dwana Melena, PA        CHANGE how you take these medications    atorvastatin 40 MG tablet  Commonly known as: LIPITOR  Take 1 tablet (40 mg total) by mouth daily.  What changed:   ?? when to take this  ?? Another medication with the same name was removed. Continue taking this medication, and follow the directions you see here.  Changed by: Dwana Melena, PA        CONTINUE taking these medications    amoxicillin 500 MG capsule  Commonly known as: AMOXIL  Take 2,000 mg by mouth once as needed. Prn as indicated by PCP for dental visits.     aspirin 81 MG tablet  Commonly known as: ECOTRIN  Take 81 mg by mouth daily.     bictegrav-emtricit-tenofov ala 50-200-25 mg tablet  Commonly known as: BIKTARVY  Take 1 tablet by mouth daily.     metoprolol tartrate 25 MG tablet  Commonly known as: LOPRESSOR  Take 25 mg by mouth every twelve (12) hours.     metoprolol tartrate 25 MG tablet  Commonly known as: LOPRESSOR  Take 1  tablet (25 mg total) by mouth every twelve (12) hours.     oxyCODONE-acetaminophen 5-325 mg per tablet  Commonly known as: PERCOCET  Take 1-2 tablets by mouth every eight (8) hours.     potassium chloride 20 MEQ CR tablet  Commonly known as: KLOR-CON  Take 20 mEq by mouth daily.     warfarin 2.5 MG tablet  Commonly known as: JANTOVEN  7.5mg  (3 tabs) daily or as directed              General:  Well appearing, no acute distress    Signs/Symptoms bleeding or bruising: no    Signs/Symptoms of Potential Embolic Events: no    Changes In:   Diet:no   Medications:no   Activities / Lifestyle:no   Health Status:no   Nonadherent: no       Medications reviewed including OTC and herbals:yes    The patient set a personal goal for anticoagulation management today:Take warfarin daily as recommended.    Medication adherence and barriers to the treatment plan have been addressed. Opportunities to optimize healthy behaviors have been discussed. Patient / caregiver voiced understanding.    Additional History:

## 2020-08-26 DIAGNOSIS — A630 Anogenital (venereal) warts: Principal | ICD-10-CM

## 2020-08-26 LAB — LYMPH MARKER LIMITED,FLOW
ABSOLUTE CD3 CNT: 1360 {cells}/uL (ref 915–3400)
ABSOLUTE CD4 CNT: 480 {cells}/uL — ABNORMAL LOW (ref 510–2320)
ABSOLUTE CD8 CNT: 840 {cells}/uL (ref 180–1520)
CD3% (T CELLS): 68 % (ref 61–86)
CD4% (T HELPER): 24 % — ABNORMAL LOW (ref 34–58)
CD4:CD8 RATIO: 0.6 — ABNORMAL LOW (ref 0.9–4.8)
CD8% T SUPPRESR: 42 % — ABNORMAL HIGH (ref 12–38)

## 2020-08-29 NOTE — Unmapped (Signed)
[facility] Clinical Assessment & Refill Coordination Note    Warren [name]., DOB: 1975-01-23  Phone: [phone] (home)     All above HIPAA information was verified with patient.     Was a Nurse, learning disability used for this call? No    Specialty Medication(s):   Infectious Disease: Biktarvy     Current Outpatient Medications   Medication Sig Dispense Refill   ??? amoxicillin (AMOXIL) 500 MG capsule Take 2,000 mg by mouth once as needed. Prn as indicated by PCP for dental visits. (Patient not taking: Reported on 08/25/2020)     ??? aspirin (ECOTRIN) 81 MG tablet Take 81 mg by mouth daily.  (Patient not taking: No sig reported)     ??? atorvastatin (LIPITOR) 40 MG tablet Take 1 tablet (40 mg total) by mouth daily. 90 tablet 3   ??? bictegrav-emtricit-tenofov ala (BIKTARVY) 50-200-25 mg tablet Take 1 tablet by mouth daily. 90 tablet 1   ??? metoprolol tartrate (LOPRESSOR) 25 MG tablet Take 25 mg by mouth every twelve (12) hours. (Patient not taking: Reported on 08/25/2020)     ??? metoprolol tartrate (LOPRESSOR) 25 MG tablet Take 1 tablet (25 mg total) by mouth every twelve (12) hours. 180 tablet 3   ??? oxyCODONE-acetaminophen (PERCOCET) 5-325 mg per tablet Take 1-2 tablets by mouth every eight (8) hours.  (Patient not taking: No sig reported)     ??? podofilox (CONDYLOX) 0.5 % gel Apply topically Two (2) times a day. Use for 3 days, hold for 4 days. Repeat as needed up to 4 times. 3.5 g 0   ??? potassium chloride (KLOR-CON) 20 MEQ CR tablet Take 20 mEq by mouth daily.  (Patient not taking: Reported on 08/25/2020)     ??? warfarin (JANTOVEN) 2.5 MG tablet 7.5mg  (3 tabs) daily or as directed 90 tablet 5     No current facility-administered medications for this visit.        Changes to medications: Warren [name] reports no changes at this time.    No Known Allergies    Changes to allergies: No    SPECIALTY MEDICATION ADHERENCE     Biktarvy 50-200-25 mg: 0 days of medicine on hand       Medication Adherence    Patient reported X missed doses in the last month: 3  Specialty Medication: Biktarvy 50-200-25mg   Patient is on additional specialty medications: No  Any gaps in refill history greater than 2 weeks in the last 3 months: yes  Demonstrates understanding of importance of adherence: yes  Informant: patient  Provider-estimated medication adherence level: variable  Patient is at risk for Non-Adherence: Yes  The following intervention(s) were discussed with the patient: None   Other non-adherence reason: Patient say he has not missed doses.  However, last fill date was 06/27/20.           Specialty medication(s) dose(s) confirmed: Regimen is correct and unchanged.     Are there any concerns with adherence? Yes: large gaps between fill dates    Adherence counseling provided? None. He says he does not miss doses even though his last fill was 06/27/20    CLINICAL MANAGEMENT AND INTERVENTION      Clinical Benefit Assessment:    Do you feel the medicine is effective or helping your condition? Yes    HIV ASSOCIATED LABS:     Lab Results   Component Value Date/Time    HIVRS Detected (A) 04/29/2020 04:23 PM    HIVRS Not Detected 11/03/2019  02:01 PM    HIVRS Not Detected 09/18/2019 12:40 PM    HIVCP 654 (H) 04/29/2020 04:23 PM    HIVCP <40 (H) 05/26/2019 01:46 PM    ACD4 480 (L) 08/25/2020 10:18 AM    ACD4 345 (L) 04/29/2020 04:23 PM    ACD4 399 (L) 09/18/2019 12:40 PM       Clinical Benefit counseling provided? Not needed    Adverse Effects Assessment:    Are you experiencing any side effects? No    Are you experiencing difficulty administering your medicine? No    Quality of Life Assessment:    How many days over the past month did your HIV  keep you from your normal activities? For example, brushing your teeth or getting up in the morning. 0    Have you discussed this with your provider? Not needed    Acute Infection Status:    Acute infections noted within Epic:  No active infections  Patient reported infection: None    Therapy Appropriateness:    Is therapy appropriate? Yes, therapy is appropriate and should be continued    DISEASE/MEDICATION-SPECIFIC INFORMATION      N/A    PATIENT SPECIFIC NEEDS     - Does the patient have any physical, cognitive, or cultural barriers? No    - Is the patient high risk? No    - Does the patient require a Care Management Plan? No     - Does the patient require physician intervention or other additional services (i.e. nutrition, smoking cessation, social work)? No      SHIPPING     Specialty Medication(s) to be Shipped:   Infectious Disease: Biktarvy    Other medication(s) to be shipped: podofilox 0.5 % gel     Changes to insurance: No    Delivery Scheduled: Yes, Expected medication delivery date: 08/30/20 for Biktarvy and 08/31/20 for podofilox.     Medication will be delivered via UPS to the confirmed prescription address in [facility].    The patient will receive a drug information handout for each medication shipped and additional FDA Medication Guides as required.  Verified that patient has previously received a Conservation officer, historic buildings and a Surveyor, mining.    The patient or caregiver noted above participated in the development of this care plan and knows that they can request review of or adjustments to the care plan at any time.      All of the patient's questions and concerns have been addressed.    [name]   [facility] Specialty Pharmacist

## 2020-08-29 NOTE — Unmapped (Signed)
Called patient to determine if he was able to get in touch with [facility] to schedule next [place] shipment. Last filled 06/27/2020 for 30 day supply and patient is at risk of becoming nonadherent. Per [name], PharmD note on 08/01/20, they have been unable to reach patient. Reached patient and he relayed he believes he did receive a shipment in July and took his last Biktarvy pill last night. Encouraged patient to call [facility] to schedule next shipment and in the future to make sure a plan is in place for shipments ~1 week before running out of medication. Mr. Patchell confirms he has [facility]'s number and will call them now.     Derrel Nip, PharmD  PGY2 Ambulatory Care Pharmacy Resident

## 2020-08-30 LAB — HIV RNA, QUANTITATIVE, PCR: HIV RNA QNT RSLT: NOT DETECTED

## 2020-09-01 NOTE — Unmapped (Signed)
Attempted to contact patient by phone. Left discrete message for patient to call clinic at [phone].  I am calling to discuss:  1. Patient should have follow up with Dr. Neysa Hotter for genital wart excision which was being scheduled in 09/2019 but patient had unstable housing and transportation at the time and couldn't follow up.  2. Notified by [facility] that Medicaid does not cover Condylox.

## 2020-09-15 ENCOUNTER — Ambulatory Visit: Admit: 2020-09-15 | Payer: MEDICAID

## 2020-10-14 NOTE — Unmapped (Signed)
[facility] Specialty Pharmacy Refill Coordination Note    Specialty Medication(s) to be Shipped:   Infectious Disease: Biktarvy       Other medication(s) to be shipped: No additional medications requested for fill at this time     Warren [name]., DOB: 02-26-1975  Phone: [phone] (home)       All above HIPAA information was verified with patient.     Was a Nurse, learning disability used for this call? No    Completed refill call assessment today to schedule patient's medication shipment from the [facility] [phone]).  All relevant notes have been reviewed.     Specialty medication(s) and dose(s) confirmed: Regimen is correct and unchanged.   Changes to medications: [name] reports no changes at this time.  Changes to insurance: No  New side effects reported not previously addressed with a pharmacist or physician: None reported  Questions for the pharmacist: No    Confirmed patient received a Conservation officer, historic buildings and a Surveyor, mining with first shipment. The patient will receive a drug information handout for each medication shipped and additional FDA Medication Guides as required.       DISEASE/MEDICATION-SPECIFIC INFORMATION        N/A    SPECIALTY MEDICATION ADHERENCE     Medication Adherence    Patient reported X missed doses in the last month: 0  Specialty Medication: biktarvy 50-200-25mg   Patient is on additional specialty medications: No  Patient is on more than two specialty medications: No  Any gaps in refill history greater than 2 weeks in the last 3 months: no  Demonstrates understanding of importance of adherence: yes  Informant: patient  Reliability of informant: reliable  Provider-estimated medication adherence level: good  Patient is at risk for Non-Adherence: No  Reasons for non-adherence: no problems identified              Were doses missed due to medication being on hold? No    biktarvy  50-200-25 mg: 14 days of medicine on hand         REFERRAL TO PHARMACIST     Referral to the pharmacist: Not needed      [facility]     Shipping address confirmed in Epic.     Delivery Scheduled: Yes, Expected medication delivery date: 10/04.     Medication will be delivered via UPS to the prescription address in Epic WAM.    Warren [name]   [facility] Specialty Technician

## 2020-10-24 MED FILL — BIKTARVY 50 MG-200 MG-25 MG TABLET: ORAL | 60 days supply | Qty: 60 | Fill #1

## 2020-11-09 NOTE — Unmapped (Signed)
Name: Mahamud Metts.  Date: 11/09/2020  Address: [address] [place]   [address]  [place] [address]   County of Residence:  [facility]  Phone: [phone]     Started assessment with patient options: over the phone     Is this the same address for mailing? No  If No, Mailing Address is: po box    Housing Financial controller, Medicare Part A and Medicare Part B    Tax Filing Status  I did not file taxes     Employment Status  Medically Unable to Work    Income  Tree surgeon (Retirement/Survivor's/Disability)    If no or low income, how are you meeting your basic needs?  Food Stamps/EBT, Research officer, political party Tax Household Members including relationship to you:   N/A    Someone in my household receives: Not Applicable (for household members)  Specify who: N/A    Do you have a current diagnosis for Hepatitis C?  Lab Results   Component Value Date    HEPCAB Nonreactive 07/10/2019       Have you used tobacco products four or more times per week in the last six months?  Yes    Teacher, adult education  Patient has affordable insurance through [employer], [place], and or Employment and is not eligible.    Patient given ACA education if they qualified based on answers to questions above.     Patient was informed of the following programs;   N/A    The following applications/handouts were given to patient:   N/A    The following forms were also started with the patient:   N/A    Juanell Fairly application status: Incomplete; patient needs to send ssi letter, sent an email    Patient is applying for [employer] on Charges Only     Additional Comments: No issues accessing meds, sent an email for patient to send income document.      Rhetta Mura  ID Clinic Benefits Counselor  Time of Intervention-38mins

## 2020-11-18 NOTE — Unmapped (Signed)
[facility] Specialty Pharmacy Refill Coordination Note    Specialty Medication(s) to be Shipped:   Infectious Disease: Biktarvy    Other medication(s) to be shipped: No additional medications requested for fill at this time     Warren [name]., DOB: 02/21/1975  Phone: [phone] (home)       All above HIPAA information was verified with patient.     Was a Nurse, learning disability used for this call? No    Completed refill call assessment today to schedule patient's medication shipment from the [facility] [phone]).  All relevant notes have been reviewed.     Specialty medication(s) and dose(s) confirmed: Regimen is correct and unchanged.   Changes to medications: Warren [name] reports no changes at this time.  Changes to insurance: No  New side effects reported not previously addressed with a pharmacist or physician: None reported  Questions for the pharmacist: No    Confirmed patient received a Conservation officer, historic buildings and a Surveyor, mining with first shipment. The patient will receive a drug information handout for each medication shipped and additional FDA Medication Guides as required.       DISEASE/MEDICATION-SPECIFIC INFORMATION        N/A    SPECIALTY MEDICATION ADHERENCE     Medication Adherence    Patient reported X missed doses in the last month: 0  Specialty Medication: BIKTARVY  Patient is on additional specialty medications: No              Were doses missed due to medication being on hold? No    Biktarvy 50-200-25 mg: 10-14 days of medicine on hand        REFERRAL TO PHARMACIST     Referral to the pharmacist: Not needed      [facility]     Shipping address confirmed in Epic.     Delivery Scheduled: Yes, Expected medication delivery date: 11/25/20.     Medication will be delivered via UPS to the prescription address in Epic WAM.    Warren [name]   [facility] Specialty Technician

## 2020-11-23 NOTE — Unmapped (Signed)
Patient completed RW paperwork. Patient is eligible for RW B&C grant services and Caps on Charges. IPL= 102%;FPL= 102%. Expires: 11/22/2021    Warren [name]  ID Clinic Benefits Counselor  Time of Intervention-48mins

## 2020-11-24 MED FILL — BIKTARVY 50 MG-200 MG-25 MG TABLET: ORAL | 60 days supply | Qty: 60 | Fill #2

## 2020-11-30 NOTE — Unmapped (Unsigned)
Referral Services Note     Duration of Intervention: 60 minutes    TYPE OF CONTACT: Phone    ASSESSMENT: Pt stated transportation issues. Stated that Medicaid transporation in [facility] stated he was ineligible for transportation. He didn't realize he scheduled an ID appt for tomorrow. He states he has to go to court and asked this to be changed to next week. 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     INTERVENTION:  SW called [employer] transportation and they stated they needed pt to do a new assessment with [employer]. Called [facility] and they stated that pt expired and will need to reapply for Medicaid. Talked with Medicaid broker and they stated that pt has Medicaid Direct so must go through [facility] DSS for transportation. When we told them that they said they cannot as it appears he has [facility], they advised [place] to call Medicaid Ombudsman at [phone].     Called Debbie at [facility] transportation [phone]. She stated she could re-instate pt but they can only bring pts to [facility] Friday Mornings. Said the best appt time would be 10am Fridays. Talked with Varney Daily who agreed to this. Sent message to [facility] to change appt to next Friday at 10am. Attempted to call transportation vendor per Debbie at [phone] ext [phone] but this was not accepting calls.     Called pt that if he would like to have 2 day access to transportation for appts any day of the week he would have to re-enroll in a managed care medicaid plan.  Advised pt to call Medicaid broker at [phone] to see what plans may work best for him.     PLAN:  Will try to call transportation vendor tomorrow to schedule transportation for pt appt next Friday with Varney Daily.     Bradly Bienenstock, LCSW, CHES  [facility] ID Clinic Lead Social Worker

## 2020-12-14 DIAGNOSIS — B20 Human immunodeficiency virus [HIV] disease: Principal | ICD-10-CM

## 2020-12-14 MED ORDER — BIKTARVY 50 MG-200 MG-25 MG TABLET
ORAL_TABLET | Freq: Every day | ORAL | 1 refills | 90 days | Status: CP
Start: 2020-12-14 — End: ?
  Filled 2021-01-05: qty 60, 60d supply, fill #0

## 2020-12-14 NOTE — Unmapped (Signed)
[facility] Specialty Pharmacy Refill Coordination Note    Specialty Medication(s) to be Shipped:   Infectious Disease: Biktarvy    Other medication(s) to be shipped: No additional medications requested for fill at this time     Warren Birr., DOB: 01-Aug-1975  Phone: [phone] (home)       All above HIPAA information was verified with patient.     Was a Nurse, learning disability used for this call? No    Completed refill call assessment today to schedule patient's medication shipment from the [facility] [phone]).  All relevant notes have been reviewed.     Specialty medication(s) and dose(s) confirmed: Regimen is correct and unchanged.   Changes to medications: Warren [name] reports no changes at this time.  Changes to insurance: No  New side effects reported not previously addressed with a pharmacist or physician: None reported  Questions for the pharmacist: No    Confirmed patient received a Conservation officer, historic buildings and a Surveyor, mining with first shipment. The patient will receive a drug information handout for each medication shipped and additional FDA Medication Guides as required.       DISEASE/MEDICATION-SPECIFIC INFORMATION        N/A    SPECIALTY MEDICATION ADHERENCE     Medication Adherence    Patient reported X missed doses in the last month: 0  Specialty Medication: BIKTARVY  Patient is on additional specialty medications: No              Were doses missed due to medication being on hold? No    Biktarvy 50-200-25 mg: 14 days of medicine on hand        REFERRAL TO PHARMACIST     Referral to the pharmacist: Not needed      [facility]     Shipping address confirmed in Epic.     Delivery Scheduled: Yes, Expected medication delivery date: 12/23/20.     Medication will be delivered via UPS to the prescription address in Epic WAM.    Unk Lightning   [facility] Pharm[facility] Specialty Technician

## 2020-12-22 NOTE — Unmapped (Signed)
Warren [name]. 's Biktarvy shipment will be delayed as a result of the medication is too soon to refill until 12/15.     I have reached out to the patient  at [phone] and left a voicemail message.  We will wait for a call back from the patient to reschedule the delivery.  We have not confirmed the new delivery date.

## 2020-12-30 NOTE — Unmapped (Signed)
Spoke with [name] today and relayed the message that Warren [name] is refill too soon until 12/15 and he agreed to the delivery of his Biktarvy on 12/16 by UPS.

## 2021-01-27 NOTE — Unmapped (Signed)
TYPE OF ENCOUNTER: phone    REASON: [facility] initiation attempt    ASSESSMENT: Spoke with patient. Agrees to [facility], but currently busy and agrees to talk around 1300 today    ADHERENCE: not assessed    INTERVENTION:  Will return phone call      CONTACT DURATION: 5 minutes

## 2021-01-31 NOTE — Unmapped (Signed)
[facility] Specialty Pharmacy Clinical Assessment & Refill Coordination Note    Warren [name]., DOB: Sep 18, 1975  Phone: [phone] (home)     All above HIPAA information was verified with patient.     Was a Nurse, learning disability used for this call? No    Specialty Medication(s):   Infectious Disease: Biktarvy     Current Outpatient Medications   Medication Sig Dispense Refill   ??? amoxicillin (AMOXIL) 500 MG capsule Take 2,000 mg by mouth once as needed. Prn as indicated by PCP for dental visits. (Patient not taking: Reported on 08/25/2020)     ??? aspirin (ECOTRIN) 81 MG tablet Take 81 mg by mouth daily.  (Patient not taking: No sig reported)     ??? atorvastatin (LIPITOR) 40 MG tablet Take 1 tablet (40 mg total) by mouth daily. 90 tablet 3   ??? bictegrav-emtricit-tenofov ala (BIKTARVY) 50-200-25 mg tablet Take 1 tablet by mouth daily. 90 tablet 1   ??? metoprolol tartrate (LOPRESSOR) 25 MG tablet Take 25 mg by mouth every twelve (12) hours. (Patient not taking: Reported on 08/25/2020)     ??? metoprolol tartrate (LOPRESSOR) 25 MG tablet Take 1 tablet (25 mg total) by mouth every twelve (12) hours. 180 tablet 3   ??? oxyCODONE-acetaminophen (PERCOCET) 5-325 mg per tablet Take 1-2 tablets by mouth every eight (8) hours.  (Patient not taking: No sig reported)     ??? podofilox (CONDYLOX) 0.5 % gel Apply topically Two (2) times a day. Use for 3 days, hold for 4 days. Repeat as needed up to 4 times. 3.5 g 0   ??? potassium chloride (KLOR-CON) 20 MEQ CR tablet Take 20 mEq by mouth daily.  (Patient not taking: Reported on 08/25/2020)     ??? warfarin (JANTOVEN) 2.5 MG tablet 7.5mg  (3 tabs) daily or as directed 90 tablet 5     No current facility-administered medications for this visit.        Changes to medications: Dujuan reports no changes at this time.    No Known Allergies    Changes to allergies: No    SPECIALTY MEDICATION ADHERENCE     Biktarvy 50-200-25 mg: approximately 34 days of medicine on hand       Medication Adherence    Patient reported X missed doses in the last month: 0  Specialty Medication: Biktarvy 50-200-25mg   Patient is on additional specialty medications: No  Demonstrates understanding of importance of adherence: yes  Informant: patient  Provider-estimated medication adherence level: good  Patient is at risk for Non-Adherence: No          Specialty medication(s) dose(s) confirmed: Regimen is correct and unchanged.     Are there any concerns with adherence? No    Adherence counseling provided? Not needed    CLINICAL MANAGEMENT AND INTERVENTION      Clinical Benefit Assessment:    Do you feel the medicine is effective or helping your condition? Yes    HIV ASSOCIATED LABS:     Lab Results   Component Value Date/Time    HIVRS Not Detected 08/25/2020 10:18 AM    HIVRS Detected (A) 04/29/2020 04:23 PM    HIVRS Not Detected 11/03/2019 02:01 PM    HIVCP 654 (H) 04/29/2020 04:23 PM    HIVCP <40 (H) 05/26/2019 01:46 PM    ACD4 480 (L) 08/25/2020 10:18 AM    ACD4 345 (L) 04/29/2020 04:23 PM    ACD4 399 (L) 09/18/2019 12:40 PM       Clinical  Benefit counseling provided? Labs from 08/25/20 show evidence of clinical benefit    Adverse Effects Assessment:    Are you experiencing any side effects? No    Are you experiencing difficulty administering your medicine? No    Quality of Life Assessment:    How many days over the past month did your HIV  keep you from your normal activities? For example, brushing your teeth or getting up in the morning. 0    Have you discussed this with your provider? Not needed    Acute Infection Status:    Acute infections noted within Epic:  No active infections  Patient reported infection: None    Therapy Appropriateness:    Is therapy appropriate and patient progressing towards therapeutic goals? Yes, therapy is appropriate and should be continued    DISEASE/MEDICATION-SPECIFIC INFORMATION      N/A    PATIENT SPECIFIC NEEDS     - Does the patient have any physical, cognitive, or cultural barriers? No    - Is the patient high risk? No    - Does the patient require a Care Management Plan? No       SHIPPING     Specialty Medication(s) to be Shipped:   Infectious Disease: Patient does not need Biktarvy shipped at this time    Other medication(s) to be shipped: No additional medications requested for fill at this time     Changes to insurance: No    Delivery Scheduled: No. Will schedule a refill call approximately 3 weeks from today.     The patient will receive a drug information handout for each medication shipped and additional FDA Medication Guides as required.  Verified that patient has previously received a Conservation officer, historic buildings and a Surveyor, mining.    The patient or caregiver noted above participated in the development of this care plan and knows that they can request review of or adjustments to the care plan at any time.      All of the patient's questions and concerns have been addressed.    [name]   [facility] Specialty Pharmacist

## 2021-02-08 NOTE — Unmapped (Signed)
Referral Services Note     Duration of Intervention: 10 minutes    TYPE OF CONTACT: Phone    ASSESSMENT: Pt is scheduled for an appointment on Feb 2nd and needs transportation to the [facility].     INTERVENTION: SW contacted Isla Pence at the Non-Emergency Medicaid Transportation number [phone]). Debbie informed sw the contracted Zenaida Niece only comes to [facility] once a week on Fridays. If we need to schedule transportation any other day of the week we would need to go through volunteer services. SW was transferred to the volunteer driver line ([phone]). SW left a message regarding the request.     PLAN:  SW is waiting on a call from the volunteer driver's line. If no contact is made sw will follow up again.    Sofie Rower, LCSW-A  [facility] ID Clinic Social Work

## 2021-02-08 NOTE — Unmapped (Signed)
TYPE OF ENCOUNTER: Telephone    REASON:  [facility] Enrollment    ASSESSMENT: Call placed to patient to establish [facility] nursing care. Pt with hx of HIV, Pacemaker, bicuspid aortic valve replacement.  Last CD4 count 08/25/20 480.  VL not detectable. PT/INR 12.1/1.0 (Reviewed by Salli Quarry PA).  No issues with ADLS.  Address is stable at this time.  Reports receiving SNAP at $130/month and reported not eating well.  I know I have to do better.  RW eligible through 11/2021    ADHERENCE:Reported missing  Warfarin doses at times but stated  I take it. Reports 100% adherence with Biktarvy.  No RTC scheduled.     INTERVENTION:  Reached out to provider for Warfarin and they were able to schedule appt for the same day at 11:15. Discussed possibly arranging to have labs drawn at nearby [place], but patient requested to have all done during visit. Voiced concerns about transportation. Stated  now that I have that new insurance, [employer], [place] try to arrange transportation. Talked with [employer] LCSW to assist with transportation. Talked with about reaching out to neighboring Food bank to help out needs. Identified [employer] a few blocks from patients home. Discussed hours 10-2pm.     PLAN: Scheduled next appt. 03/15/21 at 0930 a.m. Additional appt. with Salli Quarry at 11:15. Pt has agreed to attend appointments with plans to have lab work drawn at that time.     CONTACT DURATION: 60 minutes ( Care coordination/ and phone time)

## 2021-02-08 NOTE — Unmapped (Signed)
*   YES below indicates need present in that domain. Briefly note what each  yes means. *    MEDICAL NEEDS:  1. HIV Medical Needs: no   2. HIV Medication Adherence: no   3. HIV Medical Care (engagement in care): no   4. Non-HIV Medical Needs: yes Pt is on Coumadin with no weekly labs for management     MH/SA NEEDS:  5. Mental Health: no   6. Drug/Alcohol Use: no      FISCAL NEEDS:  7. Financial Situation: yes  Reports having BCBS insurance and receives SNAP $130/month  8. Benefits/Insurance/Pharmacy Assistance: no      BASIC NEEDS:  9. Housing: no    10. Transportation: yes  Will reach to SW for assistance. Pt stated that he will try as well   11. Social Support System: no    12. Food/Nutrition, Clothing, Utilities: yes Reported having a food bank two blocks, called the [employer] encouraged to go by. Hours discusssed per website.     PREVENTION NEEDS:  13. Risk Reduction Counseling Needs: no    14. Disclosure to Partners: no  Reports no partner at this time    OTHER NEEDS: (Optional Section, add more items as needed)  15. Activities of Daily Living: no    16. Medication Side Effects Management: no   17. Non-HIV Medication Adherence: yes Concerned about warfarin weekly labs  18. Knowledge of Medical Aspects of HIV: no    19. Provider Relationships: no  20. Family Functioning: no    21. Domestic Violence: no    22. Other Competing Demands: no   23. Criminal Justice System Involvement: no  24. Communication Skills (Health Literacy): no

## 2021-02-08 NOTE — Unmapped (Signed)
NEED: Patient identified or is identified as having difficulty with medication adherence and medical appointment attendance.                 SHORT TERM GOALS:   1. Identify barriers to adherence and transportation  2. Demonstrate understanding of consequences of poor adherence - ie resistance, harm that having detectable virus can do to body, increase in possibility of transmission to others.  3. Improve adherence to </= 1 missed dose per week which will be evidenced by undetectable viral load      PERSON:  Nurse, patient, financial counselor, pharm d, medical provider, Social worker                 INTERVENTIONS:   1. Nurse  will establish therapeutic/trust relationship and assess medication readiness with patient through use of motivational interviewing techniques  2. Nurse will provide education regarding the importance of medication  Adherence as well as consequences of non adherence.  3. Nurse will discuss the importance of honest communication with nurse/staff regarding level of adherence, difficulties with adherence, and side effects  4. Nurse  will discuss strategies for medication adherence and provide pt with medication adherence tools  5. Nurse and Patient will coordinate with financial counselor regarding need financial assistance related to obtaining medications (Co-pay card, ADAP, SPAP, ICAP, etc)..  6. Nurse will contact patient by by phone on a monthly (weekly/biweekly/monthly) basis phone  to assess level of adherence, presence of side effects, provide continued education, offer support and encouragement.  7. Pt will attend regular appointments in clinic at least 3x per year.    8. Pt will notify nurse  if barriers to appointment attendance arise and nurse/SW will assist pt to access regular transportation assistance.                 OUTCOME:   1. Documented patient report of improved adherence in EPIC  Documented lab evidence (< viral load) of improved adherence as well as patient remaining in care with at least 3 visits per year to see provider     DATE REVIEW/TIME LINE: 6 MONTHS  Patient is aware of and in agreement with this plan

## 2021-02-24 NOTE — Unmapped (Signed)
[facility] Refill Coordination Note    Specialty Medication(s) to be Shipped:   Infectious Disease: Biktarvy    Other medication(s) to be shipped: No additional medications requested for fill at this time     Warren [name]., DOB: 1975/06/03  Phone: [phone] (home)       All above HIPAA information was verified with patient.     Was a Nurse, learning disability used for this call? No    Completed refill call assessment today to schedule patient's medication shipment from the [facility] [phone]).  All relevant notes have been reviewed.     Specialty medication(s) and dose(s) confirmed: Regimen is correct and unchanged.   Changes to medications: Warren [name] reports no changes at this time.  Changes to insurance: No  New side effects reported not previously addressed with a pharmacist or physician: None reported  Questions for the pharmacist: No    Confirmed patient received a Conservation officer, historic buildings and a Surveyor, mining with first shipment. The patient will receive a drug information handout for each medication shipped and additional FDA Medication Guides as required.       DISEASE/MEDICATION-SPECIFIC INFORMATION        N/A    SPECIALTY MEDICATION ADHERENCE     Medication Adherence    Patient reported X missed doses in the last month: 0  Specialty Medication: BIKTARVY 50-200-25 mg  Patient is on additional specialty medications: No              Were doses missed due to medication being on hold? No    Biktarvy 50-200-25 mg: 10 days of medicine on hand        REFERRAL TO PHARMACIST     Referral to the pharmacist: Not needed      [facility]     Shipping address confirmed in Epic.     Delivery Scheduled: Yes, Expected medication delivery date: 03/03/21.     Medication will be delivered via UPS to the prescription address in Epic WAM.    Unk Lightning   [facility] Specialty Technician

## 2021-03-02 MED FILL — BIKTARVY 50 MG-200 MG-25 MG TABLET: ORAL | 60 days supply | Qty: 60 | Fill #1

## 2021-03-09 ENCOUNTER — Ambulatory Visit: Admit: 2021-03-09 | Payer: MEDICARE

## 2021-03-10 ENCOUNTER — Ambulatory Visit: Admit: 2021-03-10 | Payer: MEDICARE

## 2021-03-10 ENCOUNTER — Ambulatory Visit: Admit: 2021-03-10 | Payer: MEDICARE | Attending: Family | Primary: Family

## 2021-03-10 NOTE — Unmapped (Unsigned)
INFECTIOUS DISEASES CLINIC  [address]  [place], [place]  [address]  P [phone]  F [phone]     Primary care provider: Idelia Salm, MD    Assessment/Plan:      HIV (dx'd 2008)  - chronic, stable   ?? According to notes, patient was diagnosed in 2008 after routine STI testing.   ?? Denies prior opportunistic infections.  ?? Has been non adherent with ART in the past. Last took [place].  ?? 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  ?? Patient had been out of care since moving to [place] from [place] in 06/2018.  ?? In Medical Case Management with IExcell Seltzer, RN in light of complex medical history  ?? Has not been coming to appointments due to limitations in transportation.    Overall doing well. Current regimen: Biktarvy (BIC/FTC/TAF)  Misses doses of ARVs rarely    Med access via Medicaid  CD4 count 200's-300's  Discussed ARV adherence and taking ARVs with food    Lab Results   Component Value Date    ACD4 480 (L) 08/25/2020    CD4 24 (L) 08/25/2020    HIVRS Not Detected 08/25/2020    HIVCP 654 (H) 04/29/2020     ??? CD4, HIV RNA, and safety labs (full return panel)  ??? Continue current therapy. On Biktarvy  ??? Encouraged continued excellent ARV adherence      Genital warts  - chronic, stable  Diagnosed in [place]. Evaluated with previous PCP in [place], first treated with cryotherapy and then Condylox without relief.   Seen by Dr. Neysa Hotter who agreed with need for surgical intervention and biopsy. Seen by pre-op clinical for evaluation in 09/2019.  Will ask I. Baker to help facilitate another appointment with Dr. Neysa Hotter to again address patient's genital warts.  ?? Found an old bottle of Condylox and started using on some anal lesions and warts started to go away. Would like to have more Condylox.   ?? On exam, noted large genital wart to 6:00 position of anus and multiple other smaller warts. Would benefit from surgical intervention. I will notify patient that he needs to follow up with Dr. Neysa Hotter as he has established care with him and will give him Condylox for smaller lesions.      Mental Health - chronic, stable  Patient came to [facility] from [place] in mid 2020. Started having complex medical issues that lead to major heart surgery  Had girlfriend he somewhat depended on during this time but now that relationship has ended under bad circumstances.  He has moved into another apartment but has no transportation besides Medicaid transport.  ?? He is interested in therapy.  ?? Is at peace, sees ex-girlfriend sometimes casually  ?? Not interested in support group      Hyperlipidemia - chronic, progressive  Patient had been taking lipitor but had run out.  ?? Refilled today by PCP  ?? 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 On 4/17 patient presented to OSH [facility]) with fevers/chillls/weakness with MSSA cultures positive. TEE on 4/21 showed a 2x2 cm vegetation on the non-coronary cusp on the aortic valve with mild-mod AI.  ?? Treated with Vancomycin and Cefepime, then narrowed to Cefazolin.  ?? 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 Pacemaker evaluation showed Medtronic Azure Dual Chamber Pacemaker, EKG &??telemetry demonstrating a-sensed, v-paced rhythm. Repeat TTE 5/4 showed Paravalvular echo lucency measuring approximately 2cm wide as previously seen on TEE. It extends 180 degrees along the right border of the homograft. There appears to be dehiscence and rocking of the aortic homograft. His status is tenuous with extremely high risk of decompensation &??death should the graft fail.   ?? Transferred to [facility] on 05/26/19, antibiotics changed to [name] 2 g q 4h x 6 weeks (05/26/19-07/05/19), Gentamicin x 2 weeks (05/26/19-06/13/19, completed) and Rifampin 300mg  q8h x 6 weeks (05/26/19-07/05/19)  ?? Underwent redo ASCENDING AORTA GRAFT, WITH CARDIOPULMONARY BYPASS, WITH AORTIC ROOT REPLACEMENT USING VALVED CONDUIT AND CORONARY RECONSTRUCTION (EG,BENTALL), redo sternotomy s/p Root replacement Feb 2020 with [name] [facility], 07/21/19.  ?? Is supposed to be on anticogaulation with Warfarin. INR goal of 2-3.  ?? Patient has not been able to keep INR appointments due to unreliable transportation.  ?? R. Scott, [place], has been following patient for INR, he will see him today.   ?? Patient was supposed to have appointment with [name] for follow up, CT scan, and follow up echocardiogram in 01/2020, but did not end up keeping the appointment.      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 Summary below:  ?? 03/20/18: S/p aortic root replacement with homograft 25mm, reconstruction of aortomitral continuity, repair of right atrial fistula, mitral valve repair, Epicardial pacemaker placed for bradycardia   ?? History of bicuspid aortic valve with mod-severe AI  ?? AV vegetations found on TEE, blood cultures grew S. Pneumoniae  ?? Treated initially with Vancomycin and Ceftriaxone x 2 weeks to cover for possible meningitis as well and discharged to rehab on Ceftriaxone 2g IV daily x 4 weeks.   ?? No chest discomfort, SOB  ?? Needs follow up with [name] with [facility]      Nicotine dependence  - chronic, stable  Smoker, 0.5 PPD x 20 years   5-6 cigs/day  ??? Revisit readiness at next appt. Not interested in quitting.      Sexual health & secondary prevention  - abstinent  Not in relationship.   Parts of body used during sex include: mouth and penis. Does not have anal sex. Gives and receives oral sex. Insertive partner for vaginal sex.   Since last visit has had no sex and has not had add'l STI screening.  He sometimes uses condom for vaginal sex  He does routinely discuss HIV status with partner(s).  Have not discussed interest in having children.    Lab Results   Component Value Date    RPR Nonreactive 04/29/2020    CTNAA Negative 04/29/2020    CTNAA Negative 06/23/2019    GCNAA Negative 04/29/2020    GCNAA Negative 06/23/2019    SPECSOURCE Urine (Male) 04/29/2020    SPECSOURCE Urine 06/23/2019     ??? GC/CT NAATs - patient declined screening  ??? RPR - needed but deferred to future visit  Health maintenance  - chronic, stable  PCP: Dr. Johnsie Kindred with [facility].    Oral health  He does  have a dentist. Last dental exam 2022.    Eye health  He does not use corrective lenses. Last eye exam unknown.    Metabolic conditions  Wt Readings from Last 5 Encounters:   08/25/20 96.6 kg (213 lb)   08/25/20 96.7 kg (213 lb 3.2 oz)   04/29/20 (!) 101.5 kg (223 lb 12.8 oz)   11/03/19 97 kg (213 lb 12.8 oz)   10/08/19 94.8 kg (209 lb)     Lab Results   Component Value Date    CREATININE 1.07 08/25/2020    GLUCOSEU Negative 08/25/2020    GLU 83 08/25/2020    A1C 4.7 (L) 08/25/2020    ALT 26 08/25/2020    ALT 50 (H) 04/29/2020    ALT 32 09/18/2019     # Kidney health - creatinine and UA today  # Bone health -      - 40-50 YO: OPTIMIZE Ca, VIT D, EXERCISE & CALCULATE FRAX SCORE  # Diabetes assessment - random glucose today  # NAFLD assessment - suspicion for NAFLD low    Communicable diseases  Lab Results   Component Value Date    QFTTBGOLD Negative 04/29/2020    HEPBSAB Nonreactive 06/24/2019    HEPCAB Nonreactive 07/10/2019     # TB screening - no longer needed; negative IGRA, low risk 04/29/20  # Hepatitis screening - As noted: 05/12/19 Hep A IgM NR  05/12/19 Hep B C IgM NR  # MMR screening - not assessed    Cancer screening  No results found for: PSASCRN, PSA, PAP, FINALDX  # Anorectal - not yet done  # Colorectal - screening not indicated  # Liver - no screening indicated  # Lung - screening not indicated  # Prostate - screening not indicated    Cardiovascular disease  Lab Results   Component Value Date    CHOL 167 08/25/2020    HDL 28 (L) 08/25/2020    LDL  08/25/2020      Comment:      NHLBI Recommended Ranges, LDL Cholesterol, for Adults (20+yrs) (ATPIII), mg/dL  Optimal              <161  Near Optimal        100-129  Borderline High     130-159  High                160-189  Very High            >=190  NHLBI Recommended Ranges, LDL Cholesterol, for Children (2-19 yrs), mg/dL  Desirable            <096  Borderline High     110-129  High                 >=130    Unable to calculate due to triglyceride greater than 400 mg/dL.  Unable to calculate due to triglyceride greater than 400 mg/dL.  Unable to calculate due to triglyceride greater than 400 mg/dL.    NONHDL 139 (H) 08/25/2020    TRIG 561 (H) 08/25/2020     # The ASCVD Risk score (Arnett DK, et al., 2019) failed to calculate.  - is not taking aspirin   - is taking statin (needs to restart)  - BP control good  - current smoker  # AAA screening - no indication for screening  Immunization History   Administered Date(s) Administered   ??? COVID-19 VACC,MRNA(BOOSTER)OR(6-52YR)MODERNA 08/25/2020   ??? COVID-19 VACC,MRNA,(PFIZER)(PF) 08/13/2019, 11/03/2019, 04/29/2020   ??? Pneumococcal Conjugate 20-valent 08/25/2020     ??? Immunizations today - PCV-20, CoVid booster      Counseling services took more than 50% of today's visit.  I personally spent 30 minutes face-to-face and non-face-to-face in the care of this patient, which includes all pre, intra, and post visit time on the date of service.      Disposition  Next appointment: 3 months      To do @ next RTC  ??? Discuss anal pap    Varney Daily, FNP-BC  [facility] Diseases [facility] at [facility]  [address] [place], [place] [address]    Phone: [phone]   Fax: [phone]             Subjective      Chief Complaint   HIV follow up    HPI  In addition to details in A&P above:  ?? Doing well overall. No complaints.  Denies any fever, chills, nausea, vomiting, rash, urinary complaints, diarrhea, constipation.  Mental Health stable.  Still trying to move closer to [facility].  Still having unreliable medical transportation in [place], [place]  Would like to address genital warts.      Past Medical History:   Diagnosis Date   ??? Bicuspid aortic valve     mod-severe AI   ??? Endocarditis 02/2018    S. Pneumoniae, Tx Vancomycin and Ceftriaxone x 2 weeks, then Ceftriaxone 2g daily x 4 weeks   ??? 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 05/26/19 [name] 12g continuous infusion+Rifampin 300mg  Q8H, 06/25/19 Change to Cefazolin 2g Q8H+Rifampin, TTE 5/4 showed Paravalvular echo lucency measuring approximately 2cm wide as previously seen on TEE. It extends 180 degrees along the right border of the homograft. Appears to be dehiscence and rocking of the aortic homograft.   ??? HIV disease (CMS-HCC) 2008    Dx. in [place]. Denies OI. History of nonadherence to ART. Last on Symtuza. Started on Dolutegravir 50mg  BID and Truvada daily.   ??? Myocardial infarction (CMS-HCC)     Feb 2020   ??? Stroke (CMS-HCC)          Social History  Background - Born and raised in [place], [place]. Moved to [place] with friend.    Housing - in apartment by himself  School / Work & Benefits - on disability    Social History     Tobacco Use   ??? Smoking status: Every Day     Packs/day: 0.25     Types: Cigarettes   ??? Smokeless tobacco: Never   ??? Tobacco comments:     4 cigarettes per day   Vaping Use   ??? Vaping Use: Every day   Substance Use Topics   ??? Alcohol use: Yes     Alcohol/week: 2.0 standard drinks     Types: 2 Cans of beer per week     Comment: daily ??? Drug use: Not Currently     Types: Marijuana     Comment: Rare use         Review of Systems  As per HPI. All others negative.      Medications and Allergies  He has a current medication list which includes the following prescription(s): amoxicillin, aspirin, atorvastatin, biktarvy, metoprolol tartrate, metoprolol tartrate, oxycodone-acetaminophen, podofilox, potassium chloride, and warfarin.  Allergies: Patient has no known allergies.      Family History  His family history includes Mental illness in his mother; No Known Problems in his brother, father, and sister.          Objective:      There were no vitals taken for this visit.  Wt Readings from Last 3 Encounters:   08/25/20 96.6 kg (213 lb)   08/25/20 96.7 kg (213 lb 3.2 oz)   04/29/20 (!) 101.5 kg (223 lb 12.8 oz)       Const {CBH Blank Multiple:32997::looks well,attentive, alert, appropriate}   Eyes sclerae anicteric, noninjected OU   ENT {INF PE KVQ:2595638756}   Lymph {INF PE [phone]}   CV RRR. {Blank single:19197::SEM @2RICS .,SEM @ LLSB.,No murmurs.} No rub or gallop. S1/S2.   Lungs {INF PE PULM:[phone]}   GI Soft, no organomegaly. NTND. NABS.   GU {INF PE EP:3295188416}   Rectal {INF PE RECTAL:5347601691}   Skin {INF PE SAYT:0160109323}   MSK {INF PE FTD:3220254270}   Neuro {INF PE NEURO:[phone]}   Psych {Blank single:19197::Flat,Appropriate} affect. Eye contact good. Linear thoughts. Fluent speech.     Laboratory Data  Reviewed in Epic today, using Synopsis and Chart Review filters.    Lab Results   Component Value Date    CREATININE 1.07 08/25/2020    QFTTBGOLD Negative 04/29/2020    HEPCAB Nonreactive 07/10/2019    CHOL 167 08/25/2020    HDL 28 (L) 08/25/2020    LDL  08/25/2020      Comment:      NHLBI Recommended Ranges, LDL Cholesterol, for Adults (20+yrs) (ATPIII), mg/dL  Optimal              <623  Near Optimal        100-129  Borderline High     130-159  High                160-189  Very High >=190  NHLBI Recommended Ranges, LDL Cholesterol, for Children (2-19 yrs), mg/dL  Desirable            <762  Borderline High     110-129  High                 >=130    Unable to calculate due to triglyceride greater than 400 mg/dL.  Unable to calculate due to triglyceride greater than 400 mg/dL.  Unable to calculate due to triglyceride greater than 400 mg/dL.    NONHDL 139 (H) 08/25/2020    TRIG 561 (H) 08/25/2020    A1C 4.7 (L) 08/25/2020

## 2021-03-28 DIAGNOSIS — B20 Human immunodeficiency virus [HIV] disease: Principal | ICD-10-CM

## 2021-03-28 NOTE — Unmapped (Signed)
Opened in error. Last filled on 2/9 for a 60 day supply. Closing encounter and pushing out appropriately.

## 2021-04-14 ENCOUNTER — Ambulatory Visit: Admit: 2021-04-14 | Payer: MEDICAID | Attending: Family | Primary: Family

## 2021-04-27 NOTE — Unmapped (Signed)
The [facility] has made a second and final attempt to reach this patient to refill the following medication: Biktarvy.      We have been unable to leave messages on the following phone numbers: [phone]  and have sent a text message to the following phone numbers: [phone] .    Dates contacted: 3/28, 4/6  Last scheduled delivery: shipped 2/6 (for 60 days)    The patient may be at risk of non-compliance with this medication. The patient should call the [facility] at [phone]  Option 4, then Option 2 (all other specialty patients) to refill medication.    Warren [name]   [facility] Specialty Technician

## 2021-05-18 NOTE — Unmapped (Incomplete)
It was great to see you today in clinic. Here is the plan as we discussed:    - ***

## 2021-05-18 NOTE — Unmapped (Unsigned)
DIVISION OF CARDIOLOGY  [facility] [place]        Date of Service: 05/18/2021    PCP: Referring Provider:   Dorris Carnes, MD  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 Due to MSSA prosthetic AoV endocarditis, he underwent a redo Bentall with mechanical AVR (On-X) on 07/21/19. ***  - coumadin with routine INR monitoring     The patient was staffed with Dr. Jon Billings. Plan for follow-up in ***.    Subjective:     Reason for visit: follow-up regarding history of native and prosthetic AV endocarditis s/p redo AVR    Brief HPI: Warren [name] is a 46 year old man with a PMH of Strep pneumoniae AoV endocarditis complicated by severe aortic insufficiency and root abscess (s/p aortic homograft in 02/2018), HIV, and CKD3. The patient is seen at the request of Donetta Potts for evaluation of endocarditis of both native and prosthetic aortic valve. He was admitted to the [facility] cardiology service in May 2021 with prosthetic AV endocarditis. SRS was consulted but deemed patient too high-risk for reoperation. He was subsequently referred to [facility] where he underwent a redo Bentall with mechanical AVR (On-X) on 07/21/19 plus IV cefazolin post-surgery (end-date in July 2021).      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 He recovered at rehab for 6 weeks and recovered without residual deficits. He moved to [place] in the last year and unfortunately, presented to OSH w/ fatigue, found to have MSSA bacteremia and vegetation on non-coronary cusp of prosthetic AV w/ moderate AI as well as aortic root suture line dehiscence causing pseudoaneurysm. He was discharged with IV antibiotics on 5/11 on nafcillin 2g q4 till 6/13 (6 weeks), Gentamicin 3 mg/kg/24 for 2 weeks (to end 5/22), and Rifampin 300 mg q8 till 6/13 (6 weeks). He was evaluated for high risk redo surgery @ [facility] but referred to [facility] as he was considered too high risk. It was recommended to proceed with completion of 6 weeks of antibiotics and then redo-root replacement.  He was managed as an outpatient with [facility] and completed IV Cefazolin and Rifampin on 6/18. He was started on Keflex for suppressive therapy through surgery on 6/29. His risk factor for these infections is not clear to me-denies IVDU.     ROS: 10 systems were reviewed and negative except as noted in HPI.    Cardiovascular history:  HF with mildly reduced LVEF  CHB status post Medtronic dual-chamber PPM  MSSA prosthetic aortic valve endocarditis  Redo Bentall with mechanical AVR (On-X) on 07/21/19    Cardiac medications:  Coumadin   Atorvastatin     ECG in 05/2019: Atrial-sensed and ventricular-paced rhythm - (personally reviewed)        TTE 08/30/19 [facility]):  Normal function of mechanical aortic prosthesis. s/p redo root replacement for recurrent infective endocarditis with Bentall procedure: #27-29 mm  mechanical On-X valve, #32 Valsalva conduit via redo sternotomy on 07/21/2019 at [facility]. AT 92 msec. DVI   0.69, EOA 2.9, iEOA 1.42 cm2/m2. Normal mechanical bileaflet motion. The aortic valve has been repaired/replaced. Aortic valve regurgitation is trivial, likely normal washing jet. There is a mechanical valved conduit present in the aortic position.   Procedure Date: 07/21/19.   Left ventricular ejection fraction, by estimation, is 50 to 55%. The left ventricle has low normal function. The left ventricle has no regional wall motion abnormalities. There is mild left ventricular hypertrophy. Left ventricular diastolic   parameters are indeterminate.   Right ventricular systolic function is normal. The right ventricular size is normal. There is normal pulmonary artery systolic pressure. The estimated right ventricular systolic pressure is 21.3 mmHg.   Right atrial size was mildly dilated.   The mitral valve has been repaired. Trivial mitral valve regurgitation. No evidence of mitral stenosis. The mean mitral valve gradient is 2.4 mm Hg with average heart rate of 89 bpm. There is a repair present in the mitral position. Procedure Date:   02/2018.   Aortic root/ascending aorta has been repaired/replaced.   The inferior vena cava is normal in size with greater than 50% respiratory variability, suggesting right atrial pressure of 3 mm Hg.     TTE 05/26/19:   S/P aortic homograft 2020. Paravalvular echo lucency measuring approximately 2 cm wide as previously seen on TEE. It extends 180 degrees along the right border of the homograft. There appears to be dehiscence and rocking of the aortic homograft.  There is mild to moderate regurgitation of the prosthetic aortic valve.  The left ventricle is normal in size with normal wall thickness.  The left ventricular systolic function is mildly decreased, LVEF is visually estimated at 45-50%.  There is decreased contractile function involving the apical segment(s).  There is mild mitral valve regurgitation.  The right ventricle is normal in size, with normal systolic function.  There is no evidence of a significant pericardial effusion.    LHC: None on file    Stress test: None on file    Ambulatory monitor: None on file    Objective:      There were no vitals taken for this visit.   Wt Readings from Last 3 Encounters:   08/25/20 96.6 kg (213 lb)   08/25/20 96.7 kg (213 lb 3.2 oz)   04/29/20 (!) 101.5 kg (223 lb 12.8 oz)       Physical Exam:  GEN: well-appearing male in no acute distress  CV: normal rate and regular rhythm  PULM: CTAB with normal work of breathing on room air  EXT: warm with intact distal pulses and no peripheral edema  NEURO: alert and oriented x4

## 2021-06-12 IMAGING — CR DG CHEST 1V
1 series · 1 of 1 positions shown · non-contrast
Comparison: 05/09/2019 chest radiograph.

CLINICAL DATA: Preoperative evaluation

EXAM:
CHEST  1 VIEW

[chest ap]
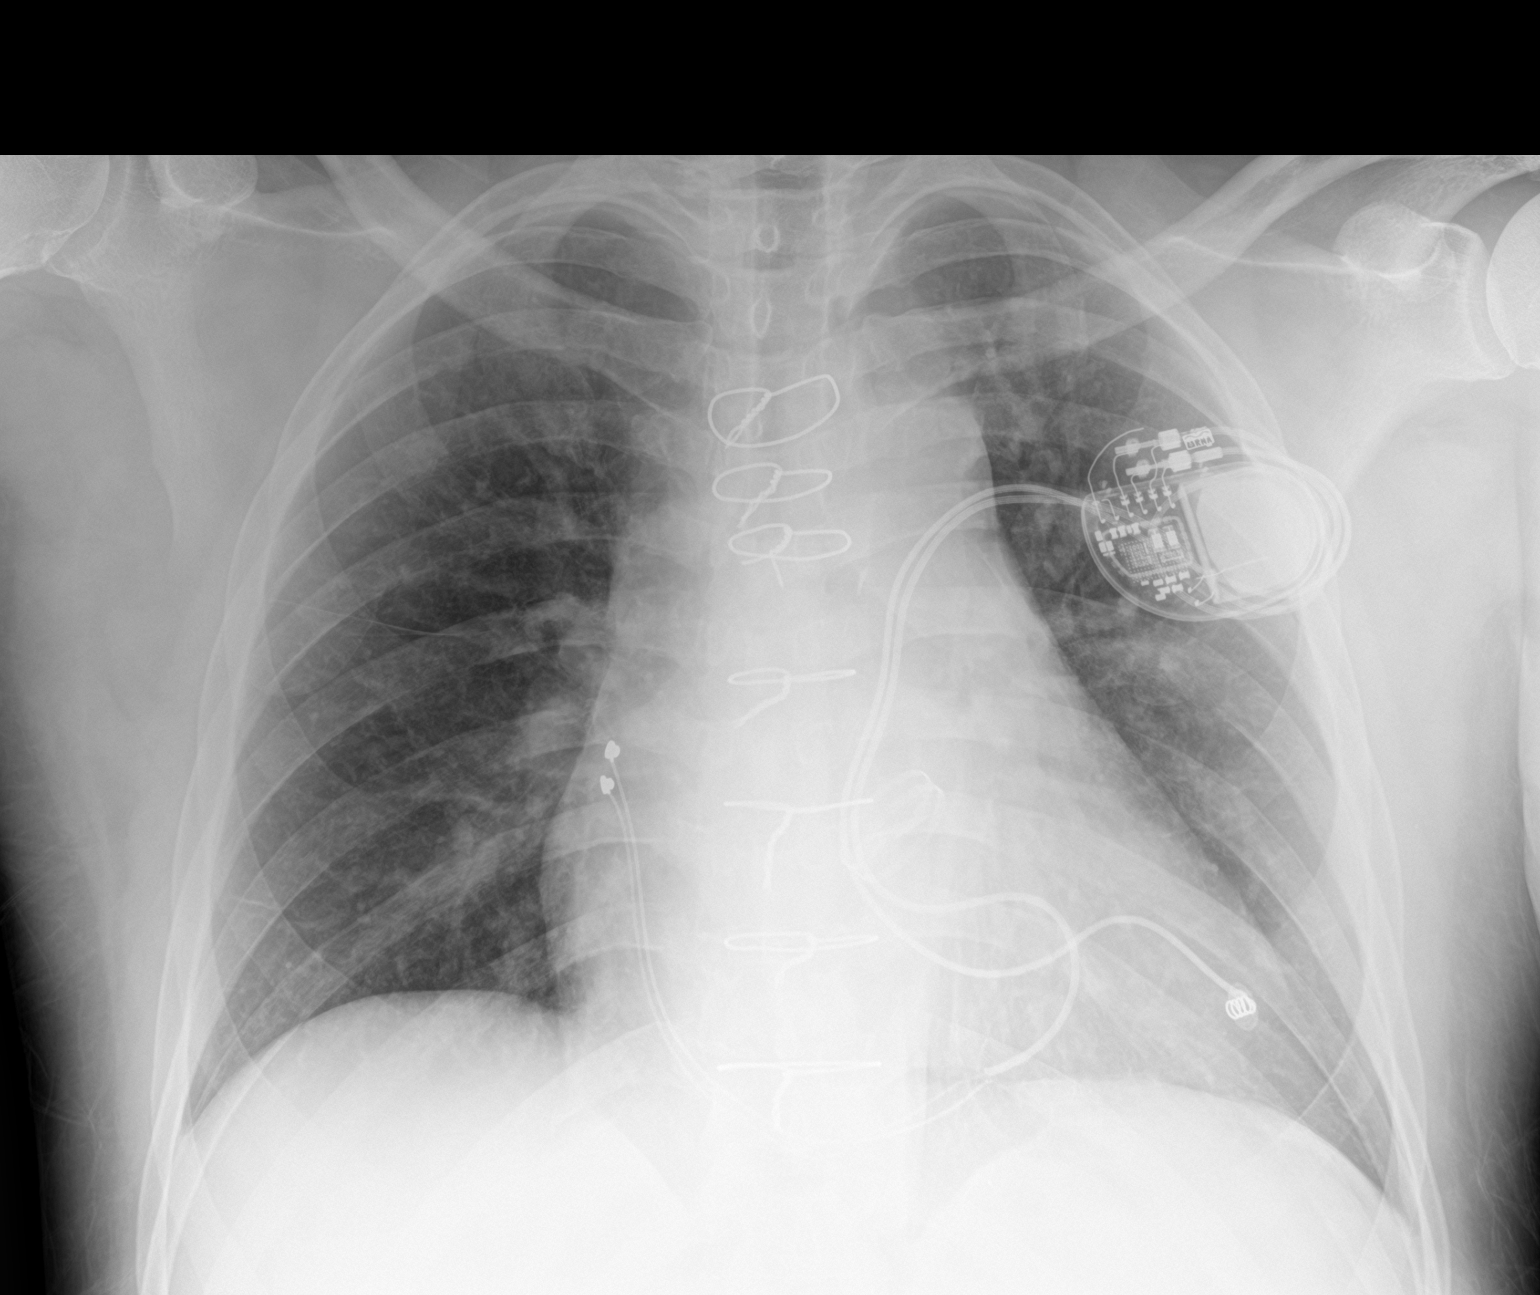

[1 of 1 positions shown; findings below may reference images not displayed]

FINDINGS: Intact sternotomy wires. Aortic valve prosthesis in place. Stable
left chest pacemaker with leads overlying heart bilaterally. Stable
cardiomediastinal silhouette with mild cardiomegaly. No
pneumothorax. No pleural effusion. No overt pulmonary edema. No
acute consolidative airspace disease.
IMPRESSION: Stable mild cardiomegaly without overt pulmonary edema. No active
pulmonary disease.

## 2021-07-17 MED FILL — BIKTARVY 50 MG-200 MG-25 MG TABLET: ORAL | 30 days supply | Qty: 30 | Fill #2

## 2021-07-17 NOTE — Unmapped (Signed)
Referral Services Note     Duration of Intervention: 11 minutes    TYPE OF CONTACT: Phone    ASSESSMENT: Pt has an upcoming appointment scheduled on 07/28/21. Pt reported the customer service number on the back of his Medicaid insurance card is [phone]. Pt endorsed he will contact the phone number on the back of his insurance card to set up his Medicaid transportation, following this interaction.    INTERVENTION:  SW encouraged pt to contact the customer service number on the back of his insurance card to setup Medicaid transportation for his upcoming appointment . SW encouraged pt to call the SW team back for additional questions or concerns at [phone].    PLAN: Pt will contact his insurance to set up Medicaid transportation.    Luella Cook, LCSW-A, LCAS-A, NCTTP  Social Work Practitioner - [facility]

## 2021-07-17 NOTE — Unmapped (Signed)
[facility] Clinical Assessment & Refill Coordination Note    Warren [name]., DOB: 03/27/1975  Phone: [phone] (home)     All above HIPAA information was verified with patient.     Was a Nurse, learning disability used for this call? No    Specialty Medication(s):   Infectious Disease: Biktarvy     Current Outpatient Medications   Medication Sig Dispense Refill    atorvastatin (LIPITOR) 40 MG tablet Take 1 tablet (40 mg total) by mouth daily. 90 tablet 3    bictegrav-emtricit-tenofov ala (BIKTARVY) 50-200-25 mg tablet Take 1 tablet by mouth daily. 90 tablet 1    metoprolol tartrate (LOPRESSOR) 25 MG tablet Take 1 tablet (25 mg total) by mouth every twelve (12) hours. 180 tablet 3    podofilox (CONDYLOX) 0.5 % gel Apply topically Two (2) times a day. Use for 3 days, hold for 4 days. Repeat as needed up to 4 times. 3.5 g 0    warfarin (JANTOVEN) 2.5 MG tablet 7.5mg  (3 tabs) daily or as directed 90 tablet 5     No current facility-administered medications for this visit.        Changes to medications: Quintez reports no changes at this time.    No Known Allergies    Changes to allergies: No    SPECIALTY MEDICATION ADHERENCE     Biktarvy 50-200-25 mg: approximately 14 days of medicine on hand       Medication Adherence    Patient reported X missed doses in the last month: 0  Specialty Medication: Biktarvy50-200-25mg   Patient is on additional specialty medications: No  Any gaps in refill history greater than 2 weeks in the last 3 months: yes  Demonstrates understanding of importance of adherence: yes  Informant: patient  Provider-estimated medication adherence level: good  Patient is at risk for Non-Adherence: No          Specialty medication(s) dose(s) confirmed: Regimen is correct and unchanged.     Are there any concerns with adherence? No    Mr Consiglio stated the long gap in refills is due to receiving a 60 day supply leading to having extra    Adherence counseling provided? Not needed    CLINICAL MANAGEMENT AND INTERVENTION      Clinical Benefit Assessment:    Do you feel the medicine is effective or helping your condition? Yes    HIV ASSOCIATED LABS:     **He is overdue for labs and an appointment.  He has had transportation problems.  I transferred him to the clinic to make an appointment**    Lab Results   Component Value Date/Time    HIVRS Not Detected 08/25/2020 10:18 AM    HIVRS Detected (A) 04/29/2020 04:23 PM    HIVRS Not Detected 11/03/2019 02:01 PM    HIVCP 654 (H) 04/29/2020 04:23 PM    HIVCP <40 (H) 05/26/2019 01:46 PM    ACD4 480 (L) 08/25/2020 10:18 AM    ACD4 345 (L) 04/29/2020 04:23 PM    ACD4 399 (L) 09/18/2019 12:40 PM       Clinical Benefit counseling provided? Labs from 08/25/20 show evidence of clinical benefit    Adverse Effects Assessment:    Are you experiencing any side effects? No    Are you experiencing difficulty administering your medicine? No    Quality of Life Assessment:      How many days over the past month did your HIV  keep you from your normal activities?  For example, brushing your teeth or getting up in the morning. 0    Have you discussed this with your provider? Not needed    Acute Infection Status:    Acute infections noted within Epic:  No active infections  Patient reported infection: None    Therapy Appropriateness:    Is therapy appropriate and patient progressing towards therapeutic goals? Yes, therapy is appropriate and should be continued    DISEASE/MEDICATION-SPECIFIC INFORMATION      N/A    PATIENT SPECIFIC NEEDS     Does the patient have any physical, cognitive, or cultural barriers? No    Is the patient high risk? No    Does the patient require a Care Management Plan? No       SHIPPING     Specialty Medication(s) to be Shipped:   Infectious Disease: Biktarvy    Other medication(s) to be shipped: No additional medications requested for fill at this time     Changes to insurance: No    Delivery Scheduled: Yes, Expected medication delivery date: 07/18/21.     Medication will be delivered via UPS to the confirmed prescription address in [facility].    The patient will receive a drug information handout for each medication shipped and additional FDA Medication Guides as required.  Verified that patient has previously received a Conservation officer, historic buildings and a Surveyor, mining.    The patient or caregiver noted above participated in the development of this care plan and knows that they can request review of or adjustments to the care plan at any time.      All of the patient's questions and concerns have been addressed.    [name]   [facility] Specialty Pharmacist

## 2021-08-03 ENCOUNTER — Ambulatory Visit: Admit: 2021-08-03 | Payer: MEDICAID | Attending: Family | Primary: Family

## 2021-08-11 NOTE — Unmapped (Signed)
Attempted to reach patient via phone. VM is full unable to leave message.     Sent Text requesting they call our office back. No confirmation if home monitor was ever received. No transmissions to date, no response from patient on previous attempts to reach them.     New recall placed for scheduling an in person visit to help establish care.

## 2021-08-11 NOTE — Unmapped (Signed)
[facility] Refill Coordination Note    Specialty Medication(s) to be Shipped:   Infectious Disease: Biktarvy    Other medication(s) to be shipped: No additional medications requested for fill at this time     Ariano Inda., DOB: Aug 06, 1975  Phone: [phone] (home)       All above HIPAA information was verified with patient.     Was a Nurse, learning disability used for this call? No    Completed refill call assessment today to schedule patient's medication shipment from the [facility] [phone]).  All relevant notes have been reviewed.     Specialty medication(s) and dose(s) confirmed: Regimen is correct and unchanged.   Changes to medications: Geff reports no changes at this time.  Changes to insurance: No  New side effects reported not previously addressed with a pharmacist or physician: None reported  Questions for the pharmacist: No    Confirmed patient received a Conservation officer, historic buildings and a Surveyor, mining with first shipment. The patient will receive a drug information handout for each medication shipped and additional FDA Medication Guides as required.       DISEASE/MEDICATION-SPECIFIC INFORMATION        N/A    SPECIALTY MEDICATION ADHERENCE     Medication Adherence    Patient reported X missed doses in the last month: 0  Specialty Medication: biktarvy              Were doses missed due to medication being on hold? No    biktarvy 50-200-25mg   : 14 days of medicine on hand       REFERRAL TO PHARMACIST     Referral to the pharmacist: Not needed      [facility]     Shipping address confirmed in Epic.     Delivery Scheduled: Yes, Expected medication delivery date: 7/26.     Medication will be delivered via UPS to the prescription address in Epic WAM.    Westley Gambles   [facility] Specialty Technician

## 2021-08-15 MED FILL — BIKTARVY 50 MG-200 MG-25 MG TABLET: ORAL | 30 days supply | Qty: 30 | Fill #3

## 2021-08-17 ENCOUNTER — Ambulatory Visit: Admit: 2021-08-17 | Payer: MEDICAID | Attending: Family | Primary: Family

## 2021-08-17 ENCOUNTER — Ambulatory Visit: Admit: 2021-08-17 | Payer: MEDICAID

## 2021-08-17 NOTE — Unmapped (Signed)
RN contacted patient to confirm appointment on 7/27 @ 1:30. Pt. States he plans to be here.

## 2021-08-25 NOTE — Unmapped (Signed)
[facility] Referral Notice:      Attempts to reach patient to re-engage in ID clinic care have not been successful. Patient is considered out of care from the ID clinic.   Patient referred to [facility] to confirm or assist in engagement in HIV care to assist in re-engagement in HIV care.     [facility] Notes: Patient has missed 13 appts since last [facility] appt. RNs have attempted to call patient before appts, pt confirms that they will attend, then does not show. Patient has identified issues with med adherence.  Last HIV Viral Load: 08/25/20 (40 copies)  Last [facility] visit: 08/25/20  Number of times contacted: Many times by the care team.     [facility] Care Plan: Out of Care Patient (>6 months)    Patient's last ID clinic appt: 08/25/20    Is the patient considered to have a detectable viral load? No    Goal: Patient will have an office visit within 6 months of the last office visit unless stated otherwise by their provider.     Contact Interventions may include:  ??? Placing phone calls to the patient and/or listed contacts.  ??? Sending the patient an Out of Care text message through the [employer] or [facility] cell phone (if texting agreement is signed).  ??? Sending the patient an Out of Care MyChart/letter.  ?? Contacting the patient's most recent pharmacies as needed for additional information such as contact information or refill history.  ?? Contacting the patient's case manager/caregiver as needed for additional information, if assigned.  ?? Researching other online resources as needed.     If the patient is reached, the [name] will attempt to:  ?? Update all contact information including primary and secondary phone numbers and emergency contact details and address. Confirm access to MyChart.  ?? Complete the Out of Care Intervention questionnaire with the patient.  ?? Explore and discuss any barriers to visit attendance the patient may be experiencing that result in missed appointments, no-shows, cancellations, or lack of a scheduled follow-up appointment. These may include communication barriers (language, phone access, texting, MyChart), transportation, financial concerns, time off work, mental health/substance use, and/or multiple medical appointments.  ?? Make appropriate referrals and link the patient to clinic nurses, social workers, benefits counselors, or other support services in the [facility] ID clinic to address concerns or barriers to care.  ?? Schedule a follow-up appointment at an appropriate interval based on a completed assessment.  ?? Confirm the patient has access to ART medications until that appointment. If the patient is not currently taking ART, needs refills, or is having difficulty taking the medication, refer to the clinical staff for follow up via [employer].    ?? Monitor attendance of the patient's scheduled appointment and provide reminder calls/texts/messages consistent with patient preference, if needed.  ?? Continue follow-up if indicated.    Expected Outcome:  Patient will have an office visit within 6 months of the last office visit unless stated otherwise by their provider.    If the patient has an urgent medical problem send a chat message to the clinic nursing staff for immediate follow-up     **If the patient is not able to be located or retained in HIV care, a referral will be placed to the [facility] HIV [facility] Counselor for further follow-up.    Duration of Service: 5 minutes    Lauren Gardiner Barefoot  Linkage and Horticulturist, commercial  Infectious Diseases Clinic

## 2021-08-29 NOTE — Unmapped (Signed)
[facility] Nursing Attempt       TYPE OF ENCOUNTER: Phone     REASON: Missed appointment     ASSESSMENT: Pt. Has not been seen in 1 year, and has canceled multiple appointments.     INTERVENTION:  RN attempted to contact patient to assess if there is an issue with transportation. There was no answer at the time of the phone call.     PLAN: RN will attempt to call back.     CONTACT DURATION: 5 min

## 2021-08-30 NOTE — Unmapped (Unsigned)
Referring Provider: Orlean [name], AGNP  [address]  [address]  [place],  [place] [address]   Primary Provider: Dorris Carnes, MD  [address]  [place] [address]   Other Providers:  Warren [name] EP Follow Up Note    Reason for Visit:  Warren [name]. is a 46 y.o. male being seen for {Blank single:19197::hospital follow up,urgent visit for ***,routine visit and continued care of ***,routine visit and continued care of ischemic cardiomyopathy,routine visit and continued care of nonischemic cardiomyopathy}.    Assessment & Plan:    1. CHB  s/p Medtronic dual chamber ppm  Device checked by device nurse  I have reviewed and agree with the findings  Normal device function  A pace -   V pace - ***  Events -   Battery -   Underlying Rhythm -   Continue remotes         2. Mechanical Aortic Valve  Warfarin goal 2-3-->1.5-2.0  For On-X aortic mechanical valves, the INR goal is 2.0-3.0 initially, but can be decreased to 1.5-2.0 after 3 months post-op.      ECG: {Blank single:19197::deferred,NSR,Afib,A paced,V paced,AV paced}.  See official ECG report.    Follow-up:  No follow-ups on file.    History of Present Illness:  Warren [name]. is a 46 y.o. male with a past medical history of HIV, CVA, HLD    **lifelong prophylactic antibiotics prior to any elective invasive procedures    ***    Interval History: ***    Cardiovascular History & Procedures:    Cath / PCI:  ?? ***    CV Surgery:  ?? 07/22/2019 mechanical aortic valve replacement  ?? 07/21/2019   ?? Bentall (#27-37mm mechanical On-X valve, #32 Valsalva conduit) via redo sternotomy for bioprosthetic valve endocarditis   ?? 02/2018 aortic root replacement with Homograft  ?? **    EP Procedures and Devices:  ?? 02/2018 dual chamber ppm implant  ?? **    Non-Invasive Evaluation(s):  Echo:  ?? 05/26/2019 TTE    1. S/P aortic homograft 2020. Paravalvular echo lucency measuring  approximately 2cm wide as previously seen on TEE. It extends 180 degrees along the right border of the homograft. There appears to be dehiscence and rocking of the aortic homograft.    2. There is mild to moderate regurgitation of the prosthetic aortic valve.    3. The left ventricle is normal in size with normal wall thickness.    4. LV systolic function mildly decreased, LVEF estimated 45-50%.    5. Decreased contractile function involving the apical segment(s).    6. There is mild mitral valve regurgitation.    7. The right ventricle is normal in size, with normal systolic function.    8. There is no evidence of a significant pericardial effusion.  ?? **    Holter:  ?? ***    Cardiac CT/MRI/Nuclear Tests:  ?? ***               Other Past Medical History:  See below for the complete EPIC list of past medical and surgical history.      Allergies:  Patient has no known allergies.    Current Medications:    Current Outpatient Medications on File Prior to Visit   Medication Sig   ??? atorvastatin (LIPITOR) 40 MG tablet Take 1 tablet (40 mg total) by mouth daily.   ??? bictegrav-emtricit-tenofov ala (BIKTARVY) 50-200-25 mg  tablet Take 1 tablet by mouth daily.   ??? metoprolol tartrate (LOPRESSOR) 25 MG tablet Take 1 tablet (25 mg total) by mouth every twelve (12) hours.   ??? podofilox (CONDYLOX) 0.5 % gel Apply topically Two (2) times a day. Use for 3 days, hold for 4 days. Repeat as needed up to 4 times.   ??? warfarin (JANTOVEN) 2.5 MG tablet 7.5mg  (3 tabs) daily or as directed     No current facility-administered medications on file prior to visit.       Family History:  The patient's family history includes Mental illness in his mother; No Known Problems in his brother, father, and sister.    Social history:  He  reports that he has been smoking cigarettes. He has been smoking an average of .25 packs per day. He has never used smokeless tobacco. He reports current alcohol use of about 2.0 standard drinks of alcohol per week. He reports that he does not currently use drugs after having used the following drugs: Marijuana.    Review of Systems:  {Blank single:19197::As per HPI,As per HPI and as follows}.  Rest of the review of {Blank single:19197::twelve,ten} systems is negative or unremarkable except as stated above.    Physical Exam:  VITAL SIGNS: There were no vitals filed for this visit.   Wt Readings from Last 3 Encounters:   08/25/20 96.6 kg (213 lb)   08/25/20 96.7 kg (213 lb 3.2 oz)   04/29/20 (!) 101.5 kg (223 lb 12.8 oz)      Today's There is no height or weight on file to calculate BMI.        GENERAL: {Blank single:19197::chronically ill-appearing in no acute distress,well-appearing in no acute distress}  HEENT: Normocephalic and atraumatic. Conjunctivae and sclerae clear and anicteric.    NECK: Supple, JVP {Blank multiple:19196::not seen above the clavicle with HOB at *** degrees,*** cm H2O,***cm above the clavicle with HOB at *** degrees}.   CARDIOVASCULAR: Rate and rhythm are {Blank single:19197::irregularly irregular,regular with frequent ectopic beats,regular}.  {Blank single:19197::Normal} S1, S2. There is {Blank multiple:19196::no murmur, gallops or rubs,a grade ***/VI *** murmur, loudest at ***,S3 gallop,S4 gallop,no rub}  RESPIRATORY: {Blank single:19197::Decreased breath sounds at bases,Bibasilar crackles up to ***,left basilar crackles,right basilar crackles,Normal respiratory effort.,Normal respiratory effort. Clear to auscultation bilaterally.}.  There are {Blank single:19197::wheezes,no wheezes}.  ABDOMEN: Soft, non-tender, {Blank single:19197::Abdomen distended.,Abdomen nondistended.}    EXTREMITIES:  Radial pulses are {Blank single:19197::nonpalpable,1+,1-2+,2+}, {Blank single:19197::left greater than right,right greater than left,bilaterally}.   There is {Blank single:19197::3+ pitting pedal/pretibial edema,2+ pitting pedal/pretibial edema,1+ pitting pedal/pretibial edema,trace pedal/pretibial edema,***+ nonpitting edema,no pedal edema}, {Blank single:19197::left greater than right,right greater than left,bilaterally}.   SKIN: No rashes, ecchymosis or petechiae.  Warm, well perfused. Well healed left sided  Pacemaker/ICD *** scar   NEURO/PSYCH: Alert and oriented x 3. Affect appropriate.  Nonfocal    Pertinent Laboratory Studies:   No visits with results within 4 Month(s) from this visit.   Latest known visit with results is:   Anticoag visit on 08/25/2020   Component Date Value Ref Range Status   ??? INR, POC 08/25/2020 1.0  Provider Interpretation Final   ??? PT POC 08/25/2020 12.1  Provider Interpretation:  See INR s Final       Lab Results   Component Value Date    Creatinine 1.07 08/25/2020    Creatinine 1.00 04/29/2020    BUN 11 08/25/2020    BUN 13 04/29/2020    Sodium 137 08/25/2020    Potassium  4.0 08/25/2020    CO2 25.9 08/25/2020    Magnesium 2.2 06/02/2019    Total Bilirubin 0.5 08/25/2020    INR, POC 1.0 08/25/2020       No results found for: DIGOXIN    Lab Results   Component Value Date    Cholesterol 167 08/25/2020    Triglycerides 561 (H) 08/25/2020    HDL 28 (L) 08/25/2020    Non-HDL Cholesterol 139 (H) 08/25/2020    LDL Calculated  08/25/2020      Comment:      NHLBI Recommended Ranges, LDL Cholesterol, for Adults (20+yrs) (ATPIII), mg/dL  Optimal              <161  Near Optimal        100-129  Borderline High     130-159  High                160-189  Very High            >=190  NHLBI Recommended Ranges, LDL Cholesterol, for Children (2-19 yrs), mg/dL  Desirable            <096  Borderline High     110-129  High                 >=130    Unable to calculate due to triglyceride greater than 400 mg/dL.  Unable to calculate due to triglyceride greater than 400 mg/dL.  Unable to calculate due to triglyceride greater than 400 mg/dL.       Lab Results   Component Value Date    WBC 6.0 08/25/2020    HGB 14.4 08/25/2020    HCT 44.0 08/25/2020    Platelet 260 08/25/2020 Past Medical History:   Diagnosis Date   ??? Bicuspid aortic valve     mod-severe AI   ??? Endocarditis 02/2018    S. Pneumoniae, Tx Vancomycin and Ceftriaxone x 2 weeks, then Ceftriaxone 2g daily x 4 weeks   ??? 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 05/26/19 [name] 12g continuous infusion+Rifampin 300mg  Q8H, 06/25/19 Change to Cefazolin 2g Q8H+Rifampin, TTE 5/4 showed Paravalvular echo lucency measuring approximately 2cm wide as previously seen on TEE. It extends 180 degrees along the right border of the homograft. Appears to be dehiscence and rocking of the aortic homograft.   ??? HIV disease (CMS-HCC) 2008    Dx. in [place]. Denies OI. History of nonadherence to ART. Last on Symtuza. Started on Dolutegravir 50mg  BID and Truvada daily.   ??? Myocardial infarction (CMS-HCC)     Feb 2020   ??? Stroke (CMS-HCC)        Past Surgical History:   Procedure Laterality Date   ??? CARDIAC PACEMAKER PLACEMENT  03/20/2018    Epicardial pacemaker placed for bradycardia    ??? CARDIAC SURGERY  02/2018   ??? CARDIAC VALVE REPLACEMENT  02/2018    S/p aortic root replacement with homograft 25mm, reconstruction of aortomitral continuity, repair of right atrial fistula, mitral valve repair

## 2021-08-31 ENCOUNTER — Ambulatory Visit: Admit: 2021-08-31 | Payer: MEDICAID | Attending: Nurse Practitioner | Primary: Nurse Practitioner

## 2021-09-08 DIAGNOSIS — B20 Human immunodeficiency virus [HIV] disease: Principal | ICD-10-CM

## 2021-09-08 MED ORDER — BIKTARVY 50 MG-200 MG-25 MG TABLET
ORAL_TABLET | Freq: Every day | ORAL | 1 refills | 90 days
Start: 2021-09-08 — End: ?

## 2021-09-27 NOTE — Unmapped (Signed)
The [facility] has made a second and final attempt to reach this patient to refill the following medication: Biktarvy.      We have left voicemails on the following phone numbers: [phone] .    Dates contacted: 8/18, 8/30  Last scheduled delivery: shipped 7/25    The patient may be at risk of non-compliance with this medication. The patient should call the [facility] at [phone]  Option 4, then Option 2 (all other specialty patients) to refill medication.    Westley Gambles   [facility] Specialty Technician

## 2021-09-27 NOTE — Unmapped (Signed)
RN attempted to contact patient and schedule a clinic visit. There was no answer at the time of the phone call. RN left VM requesting the patient to call back,

## 2021-10-03 NOTE — Unmapped (Signed)
Error

## 2021-10-03 NOTE — Unmapped (Signed)
TYPE OF ENCOUNTER: Phone     REASON: Appointment     ASSESSMENT: Pt. Called requesting an appointment. He has been out of care since 2022. Pt. Prefers to schedule his appointment on the same day as his Internal medicine visit. Pt. Is scheduled with his Internal Medicine physician on 9/15, but his ID provider will not be in clinic that day.     ADHERENCE: Pt. States that he misses a few doses per week, but is starting to get back on track.     INTERVENTION: Pt. Called Internal Medicine to try to reschedule for another day, when both providers are here. Initially, the patient would have to wait until 10/5, inorder to see both providers on the same day. RN contacted Arna Medici, RN to assist with scheduling. Pt. Was scheduled with Dr. Vickey Sages ( Dr. Manuela Schwartz resident) and Varney Daily on 9/22.        CONTACT DURATION: 30 minutes

## 2021-10-11 NOTE — Unmapped (Signed)
RN contacted patient to remind him of his appointment on Friday. Pt. States,  I won't miss it, and confirms that medicaid transportation has been set up.

## 2021-10-13 ENCOUNTER — Ambulatory Visit
Admit: 2021-10-13 | Discharge: 2021-10-13 | Payer: MEDICARE | Attending: Student in an Organized Health Care Education/Training Program | Primary: Student in an Organized Health Care Education/Training Program

## 2021-10-13 ENCOUNTER — Ambulatory Visit: Admit: 2021-10-13 | Discharge: 2021-10-13 | Payer: MEDICARE | Attending: Family | Primary: Family

## 2021-10-13 ENCOUNTER — Ambulatory Visit: Admit: 2021-10-13 | Discharge: 2021-10-13 | Payer: MEDICARE | Attending: Internal Medicine | Primary: Internal Medicine

## 2021-10-13 DIAGNOSIS — Z5181 Encounter for therapeutic drug level monitoring: Principal | ICD-10-CM

## 2021-10-13 DIAGNOSIS — Z1159 Encounter for screening for other viral diseases: Principal | ICD-10-CM

## 2021-10-13 DIAGNOSIS — F22 Delusional disorders: Principal | ICD-10-CM

## 2021-10-13 DIAGNOSIS — R410 Disorientation, unspecified: Principal | ICD-10-CM

## 2021-10-13 DIAGNOSIS — R799 Abnormal finding of blood chemistry, unspecified: Principal | ICD-10-CM

## 2021-10-13 DIAGNOSIS — F29 Unspecified psychosis not due to a substance or known physiological condition: Principal | ICD-10-CM

## 2021-10-13 DIAGNOSIS — B20 Human immunodeficiency virus [HIV] disease: Principal | ICD-10-CM

## 2021-10-13 DIAGNOSIS — Z0184 Encounter for antibody response examination: Principal | ICD-10-CM

## 2021-10-13 DIAGNOSIS — E785 Hyperlipidemia, unspecified: Principal | ICD-10-CM

## 2021-10-13 DIAGNOSIS — Z952 Presence of prosthetic heart valve: Principal | ICD-10-CM

## 2021-10-13 DIAGNOSIS — A630 Anogenital (venereal) warts: Principal | ICD-10-CM

## 2021-10-13 DIAGNOSIS — Z113 Encounter for screening for infections with a predominantly sexual mode of transmission: Principal | ICD-10-CM

## 2021-10-13 DIAGNOSIS — Z7289 Other problems related to lifestyle: Principal | ICD-10-CM

## 2021-10-13 DIAGNOSIS — Z9189 Other specified personal risk factors, not elsewhere classified: Principal | ICD-10-CM

## 2021-10-13 DIAGNOSIS — Z79899 Other long term (current) drug therapy: Principal | ICD-10-CM

## 2021-10-13 DIAGNOSIS — Z23 Encounter for immunization: Principal | ICD-10-CM

## 2021-10-13 DIAGNOSIS — R4182 Altered mental status, unspecified: Principal | ICD-10-CM

## 2021-10-13 DIAGNOSIS — Z131 Encounter for screening for diabetes mellitus: Principal | ICD-10-CM

## 2021-10-13 LAB — CBC W/ AUTO DIFF
BASOPHILS ABSOLUTE COUNT: 0.1 10*9/L (ref 0.0–0.1)
BASOPHILS RELATIVE PERCENT: 1.2 %
EOSINOPHILS ABSOLUTE COUNT: 0.1 10*9/L (ref 0.0–0.5)
EOSINOPHILS RELATIVE PERCENT: 1.6 %
HEMATOCRIT: 47.3 % (ref 39.0–48.0)
HEMOGLOBIN: 15.1 g/dL (ref 12.9–16.5)
LYMPHOCYTES ABSOLUTE COUNT: 1.6 10*9/L (ref 1.1–3.6)
LYMPHOCYTES RELATIVE PERCENT: 25.1 %
MEAN CORPUSCULAR HEMOGLOBIN CONC: 31.9 g/dL — ABNORMAL LOW (ref 32.0–36.0)
MEAN CORPUSCULAR HEMOGLOBIN: 28.8 pg (ref 25.9–32.4)
MEAN CORPUSCULAR VOLUME: 90.3 fL (ref 77.6–95.7)
MEAN PLATELET VOLUME: 7.3 fL (ref 6.8–10.7)
MONOCYTES ABSOLUTE COUNT: 0.5 10*9/L (ref 0.3–0.8)
MONOCYTES RELATIVE PERCENT: 8.4 %
NEUTROPHILS ABSOLUTE COUNT: 4.2 10*9/L (ref 1.8–7.8)
NEUTROPHILS RELATIVE PERCENT: 63.7 %
PLATELET COUNT: 281 10*9/L (ref 150–450)
RED BLOOD CELL COUNT: 5.23 10*12/L (ref 4.26–5.60)
RED CELL DISTRIBUTION WIDTH: 13.2 % (ref 12.2–15.2)
WBC ADJUSTED: 6.6 10*9/L (ref 3.6–11.2)

## 2021-10-13 LAB — BILIRUBIN, TOTAL: BILIRUBIN TOTAL: 0.7 mg/dL (ref 0.3–1.2)

## 2021-10-13 LAB — ALBUMIN / CREATININE URINE RATIO
ALBUMIN QUANT URINE: <0.3 mg/dL
CREATININE, URINE: 156.7 mg/dL

## 2021-10-13 LAB — BASIC METABOLIC PANEL
ANION GAP: 8 mmol/L (ref 5–14)
BLOOD UREA NITROGEN: 10 mg/dL (ref 9–23)
BUN / CREAT RATIO: 10
CALCIUM: 9.1 mg/dL (ref 8.7–10.4)
CHLORIDE: 111 mmol/L — ABNORMAL HIGH (ref 98–107)
CO2: 24.9 mmol/L (ref 20.0–31.0)
CREATININE: 0.99 mg/dL
EGFR CKD-EPI (2021) MALE: >90 mL/min/{1.73_m2} (ref >=60)
GLUCOSE RANDOM: 91 mg/dL (ref 70–179)
POTASSIUM: 3.9 mmol/L (ref 3.4–4.8)
SODIUM: 144 mmol/L (ref 135–145)

## 2021-10-13 LAB — PROTIME-INR
INR: 0.95
PROTIME: 10.7 s (ref 9.8–12.8)

## 2021-10-13 LAB — TOXICOLOGY SCREEN, URINE
AMPHETAMINE SCREEN URINE: NEGATIVE
BARBITURATE SCREEN URINE: NEGATIVE
BENZODIAZEPINE SCREEN, URINE: NEGATIVE
BUPRENORPHINE, URINE SCREEN: NEGATIVE
CANNABINOID SCREEN URINE: POSITIVE — AB
COCAINE(METAB.)SCREEN, URINE: POSITIVE — AB
FENTANYL SCREEN, URINE: NEGATIVE
METHADONE SCREEN, URINE: NEGATIVE
OPIATE SCREEN URINE: NEGATIVE
OXYCODONE SCREEN URINE: NEGATIVE

## 2021-10-13 LAB — LIPID PANEL
CHOLESTEROL/HDL RATIO SCREEN: 4.1 (ref 1.0–4.5)
CHOLESTEROL: 177 mg/dL (ref <=200)
HDL CHOLESTEROL: 43 mg/dL (ref 40–60)
LDL CHOLESTEROL CALCULATED: 88 mg/dL (ref 40–99)
NON-HDL CHOLESTEROL: 134 mg/dL — ABNORMAL HIGH (ref 70–130)
TRIGLYCERIDES: 228 mg/dL — ABNORMAL HIGH (ref 0–150)
VLDL CHOLESTEROL CAL: 45.6 mg/dL (ref 11–50)

## 2021-10-13 LAB — VITAMIN B12: VITAMIN B-12: 1185 pg/mL — ABNORMAL HIGH (ref 211–911)

## 2021-10-13 LAB — TSH: THYROID STIMULATING HORMONE: 1.698 u[IU]/mL (ref 0.550–4.780)

## 2021-10-13 LAB — ALT: ALT (SGPT): 13 U/L (ref 10–49)

## 2021-10-13 LAB — HEMOGLOBIN A1C
ESTIMATED AVERAGE GLUCOSE: 88 mg/dL
HEMOGLOBIN A1C: 4.7 % — ABNORMAL LOW (ref 4.8–5.6)

## 2021-10-13 LAB — AST: AST (SGOT): 20 U/L (ref <=34)

## 2021-10-13 MED ORDER — ATORVASTATIN 40 MG TABLET
ORAL_TABLET | Freq: Every day | ORAL | 3 refills | 90 days | Status: CP
Start: 2021-10-13 — End: ?
  Filled 2021-10-30: qty 90, 90d supply, fill #0

## 2021-10-13 MED ORDER — BIKTARVY 50 MG-200 MG-25 MG TABLET
ORAL_TABLET | Freq: Every day | ORAL | 0 refills | 30.00000 days | Status: CP
Start: 2021-10-13 — End: ?
  Filled 2021-10-13: qty 30, 30d supply, fill #0

## 2021-10-13 MED ORDER — PODOFILOX 0.5 % TOPICAL GEL
Freq: Two times a day (BID) | TOPICAL | 0 refills | 0 days | Status: CP
Start: 2021-10-13 — End: ?

## 2021-10-13 MED ORDER — WARFARIN 5 MG TABLET
ORAL_TABLET | Freq: Every day | ORAL | 11 refills | 30 days | Status: CP
Start: 2021-10-13 — End: 2022-10-13
  Filled 2021-10-30: qty 30, 30d supply, fill #0

## 2021-10-13 MED ORDER — ENOXAPARIN 30 MG/0.3 ML SUBCUTANEOUS SYRINGE
Freq: Two times a day (BID) | SUBCUTANEOUS | 0 refills | 30 days | Status: CP
Start: 2021-10-13 — End: 2022-10-13
  Filled 2021-10-30: qty 54, 30d supply, fill #0

## 2021-10-13 NOTE — Unmapped (Signed)
[facility]  [address]  [place], [place]  [address]  P [phone]  F [phone]     Primary care provider: Bobbie Stack, MD    Assessment/Plan:      HIV (dx'd July 22, 2006)  - chronic, stable   According to notes, patient was diagnosed in 07/22/06 after routine STI testing.   Denies prior opportunistic infections.  Has been non adherent with ART in the past. Last took [place].  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  Patient had been out of care since moving to [place] from [place] in Jul 22, 2018.  In Medical Case Management with IExcell Seltzer, RN in light of complex medical history  Has not been coming to appointments due to limitations in transportation.    Overall doing well. Current regimen: Biktarvy (BIC/FTC/TAF)  Misses doses of ARVs rarely    Med access via Medicaid  CD4 count  200's-300's  Discussed ARV adherence and taking ARVs with food    Lab Results   Component Value Date    ACD4 416 (L) 10/13/2021    CD4 26 (L) 10/13/2021    HIVRS Detected (A) 10/13/2021    HIVCP 34 (H) 10/13/2021     CD4, HIV RNA, and safety labs (full return panel)  Continue current therapy. On Biktarvy, has not been taking Bitkarvy consistently due to being paranoid that he is being watched. Sent script to [facility] to get 30 day supply today. Francisca December, RN has picked up meds from pharmacy and handed it to patient during his IM appointment.  Encouraged continued excellent ARV adherence  Now has better insurance. Would like to be on Cabenuva. Stressed importance of patient attending ID appointments consistently since missed doses of Cabenuva can certainly lead to resistance. He verbalized understanding of this and reports he can now attend his appointments consistently now that he has a new transportation provider.  Added toxo IgG on 10/16/21.      Genital warts  - chronic, stable  Diagnosed in [place]. Evaluated with previous PCP in [place], first treated with cryotherapy and then Condylox without relief.   Seen by Dr. Neysa Hotter who agreed with need for surgical intervention and biopsy. Seen by pre-op clinical for evaluation in 09/2019.  Will ask I. Baker to help facilitate another appointment with Dr. Neysa Hotter to again address patient's genital warts.  On exam, noted cluster of genital warts to about 11:30 position of anus. No other small genital warts noted.  Patient previously seen by Dr. Neysa Hotter in 09/2019 and scheduled for surgical intervention but patient unable to follow up due to inconsistent transportation. He now gets transportation through Medicare which is more reliable and would like genital warts addressed.  Patient requested Condylox to bridge him to the appointment with Dr. Neysa Hotter and reviewed instructions on it's use. He verbalized understanding and will follow directions as noted.      Delusional/paranoid thinking - new  Patient came to [facility] from [place] in mid 07-22-2018. Started having complex medical issues that lead to major heart surgery  Had girlfriend he somewhat depended on during this time but now that relationship has ended under bad circumstances.  He has moved into another apartment but had no transportation besides Medicaid transport which was unreliable but now using Medicare transportation.  See HPI below for details and Dr. Shiela Mayer note from today (10/13/21).  Seeming new onset of paranoia and delusions. Recent death of his sister in Jul 21, 2021. Recent marijuana and cocaine use. Noticeable weight  loss.  Patient often tearful during the visit.  Denies SI or HI. He is pre-occupied with being watched from all aspects of his life by friends/family who he believes has conspired to ruin his life. He believes there's cameras in his home, and his cell phone is bugged. Reports recent history of smoking marijuana and snorted cocaine a few days ago.  SW team and Gwynneth Albright, RN are aware of this. Due to paranoid thoughts, SW team recommended limiting the number of caregivers the patient comes in contact with through today's visit.  He has appointment with Internal Medicine after this appointment and warm hand-off given to Dr. Delane Ginger who was seeing him today.  See her note for further plan and close follow up.   Current symptoms are out of character for the patient.   Discussed case with Dr. Cardell Peach who has low suspicion for HIV-related etiology of mental status changes. However, in setting of seeming acute symptoms and weight loss, should rule out MRI brain with/without contrast to rule out CNS Lymphoma.  Order placed. Able to secure appointment for MRI on 10/30/21 at 7:45am at spine center, same day as PCP appointment.      Hyperlipidemia - chronic, progressive  Patient had been taking lipitor but had run out.  Refilled today by PCP  Obtained lipid panel and A1C today per Dr. 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 TEE on 4/21 showed a 2x2 cm vegetation on the non-coronary cusp on the aortic valve with mild-mod AI.  Treated with Vancomycin and Cefepime, then narrowed to Cefazolin.  Per discharge summary on 06/02/19:   CTA on 05/18/19 showed a contained rupture or pseudoaneurysm of the aortic outflow tract measuring 4.2 x 3.0 cm proximal to the anterior leaflet of the aortic valve with possible aortic root thrombus. Previous TEE from OSH 4/27 showed EF  60-65% with severe AI. Pacemaker evaluation showed Medtronic Azure Dual Chamber Pacemaker, EKG & telemetry demonstrating a-sensed, v-paced rhythm. Repeat TTE 5/4 showed Paravalvular echo lucency measuring approximately 2cm wide as previously seen on TEE. It extends 180 degrees along the right border of the homograft. There appears to be dehiscence and rocking of the aortic homograft. His status is tenuous with extremely high risk of decompensation & death should the graft fail.   Transferred to [facility] on 05/26/19, antibiotics changed to [name] 2 g q 4h x 6 weeks (05/26/19-07/05/19), Gentamicin x 2 weeks (05/26/19-06/13/19, completed) and Rifampin 300mg  q8h x 6 weeks (05/26/19-07/05/19)  Underwent redo ASCENDING AORTA GRAFT, WITH CARDIOPULMONARY BYPASS, WITH AORTIC ROOT REPLACEMENT USING VALVED CONDUIT AND CORONARY RECONSTRUCTION (EG,BENTALL), redo sternotomy s/p Root replacement Feb 2020 with [name] at [facility], 07/21/19.  Is supposed to be on anticogaulation with Warfarin. INR goal of 2-3.  Patient has not been able to keep INR appointments due to unreliable transportation.  Patient was supposed to have appointment with [name] for follow up, CT scan, and follow up echocardiogram in 01/2020, but did not end up keeping the appointment.  Dr. Delane Ginger has ordered Echocardiogram and referred patient to [facility] for future follow up.      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 Summary below:  03/20/18: S/p aortic root replacement with homograft 25mm, reconstruction of aortomitral continuity, repair of right atrial fistula, mitral valve repair, Epicardial pacemaker placed for bradycardia   History of bicuspid aortic valve with mod-severe AI  AV vegetations found on TEE, blood cultures grew S. Pneumoniae  Treated initially with Vancomycin and Ceftriaxone x 2 weeks to cover for possible meningitis as well and discharged to rehab on Ceftriaxone 2g IV daily x 4 weeks.   No chest discomfort, SOB  Dr. Delane Ginger referred to [facility] Cardiology and order for Echo placed. Will try to secure Echo appointment for the same day as PCP appointment.      Nicotine dependence  - chronic, stable  Smoker, 0.5 PPD x 20 years   5-6 cigs/day  Revisit readiness at next appt. Not interested in quitting.      Sexual health & secondary prevention  - chronic, stable  Not in relationship.  LSE recently.  Parts of body used during sex include: mouth and penis. Does not have anal sex. Gives and receives oral sex. Insertive partner for vaginal sex.   Since last visit has had no sex and has not had add'l STI screening.  He sometimes uses condom for vaginal sex  He does routinely discuss HIV status with partner(s).  Have not discussed interest in having children.    Lab Results   Component Value Date    RPR Nonreactive 10/13/2021    RPR Nonreactive 04/29/2020    CTNAA Positive (A) 10/13/2021    CTNAA Negative 10/13/2021    CTNAA Negative 10/13/2021    GCNAA Positive (A) 10/13/2021    GCNAA Negative 10/13/2021    GCNAA Negative 10/13/2021    SPECSOURCE Rectum 10/13/2021    SPECSOURCE Throat 10/13/2021    SPECSOURCE Urine (Male) 10/13/2021     GC/CT NAATs - obtained today from all exposed anatomical site(s)  RPR - for screening obtained today  UPDATE: +GC/CT of rectum, needs treatment. Gwynneth Albright, RN has tried to reach out to inform patient of need for treatment.      Health maintenance  - chronic, stable  PCP: Dr. Glenetta Hew with [facility].    Oral health  He does  have a dentist. Last dental exam 2022.    Eye health  He does not use corrective lenses. Last eye exam unknown.    Metabolic conditions  Wt Readings from Last 5 Encounters:   10/13/21 87.5 kg (193 lb)   10/13/21 (P) 87.7 kg (193 lb 6.4 oz)   08/25/20 96.6 kg (213 lb)   08/25/20 96.7 kg (213 lb 3.2 oz)   04/29/20 (!) 101.5 kg (223 lb 12.8 oz)     Lab Results   Component Value Date    CREATININE 0.99 10/13/2021    GLUCOSEU Negative 08/25/2020    ALBCRERAT  10/13/2021      Comment:      Unable to calculate.    GLU 91 10/13/2021    A1C 4.7 (L) 10/13/2021    ALT 13 10/13/2021    ALT 26 08/25/2020    ALT 50 (H) 04/29/2020     # Kidney health - creatinine and UA today  # Bone health -      - 40-50 YO: OPTIMIZE Ca, VIT D, EXERCISE & CALCULATE FRAX SCORE  # Diabetes assessment - random glucose today  #  NAFLD assessment - suspicion for NAFLD low    Communicable diseases  Lab Results   Component Value Date    QFTTBGOLD Negative 04/29/2020    HEPBSAB Nonreactive 06/24/2019    HEPCAB Nonreactive 07/10/2019     # TB screening - no longer needed; negative IGRA, low risk 04/29/20  # Hepatitis screening -  As noted : 05/12/19 Hep A IgM NR  05/12/19 Hep B C IgM NR - Needs Hep A and Hep B vaccination  # MMR screening - not assessed    Cancer screening  No results found for: PSASCRN, PSA, PAP, FINALDX  # Anorectal - not yet done  # Colorectal - screening not indicated  # Liver - no screening indicated  # Lung - screening not indicated  # Prostate - screening not indicated    Cardiovascular disease  Lab Results   Component Value Date    CHOL 177 10/13/2021    HDL 43 10/13/2021    LDL 88 10/13/2021    NONHDL 134 (H) 10/13/2021    TRIG 228 (H) 10/13/2021     # The ASCVD Risk score (Arnett DK, et al., 07/12/17) failed to calculate.  - is not taking aspirin   - is taking statin (needs to restart)  - BP control good  - current smoker  # AAA screening - no indication for screening    Immunization History   Administered Date(s) Administered    COVID-19 VAC,MRNA,TRIS(12Y UP)(PFIZER)(GRAY CAP) 04/29/2020    COVID-19 VACC,MRNA(BOOSTER)OR(6-77YR)MODERNA 08/25/2020    COVID-19 VACC,MRNA,(PFIZER)(PF) 08/13/2019, 11/03/2019, 04/29/2020    COVID-19 VACCINE,MRNA(MODERNA)(PF) 08/25/2020    HEPATITIS B VACCINE ADULT, ADJUVANTED, IM(HEPLISAV B) 10/13/2021    Influenza Vaccine Quad (IIV4 PF) 34mo+ injectable 10/13/2021    Pneumococcal Conjugate 20-valent 08/25/2020     Immunizations today - hepatitis B and influenza  Hep B #2 at appropriate time  Hep A #1  New CoVid bosoter    I personally spent 60 minutes face-to-face and non-face-to-face in the care of this patient, which includes all pre, intra, and post visit time on the date of service.  All documented time was specific to the E/M visit and does not include any procedures that may have been performed.      Disposition  Next appointment: 4 weeks      To do @ next RTC  Discuss anal pap  Hep A#1    Varney Daily, FNP-BC  [facility] at [facility]  [address], [place], [place] [address]    Phone: [phone]   Fax: [phone]             Subjective      Chief Complaint   HIV follow up    HPI  In addition to details in A&P above:  Has more bumps around rectum, would like to see GI again and treatment to bridge him till he can be seen  No interim illnesses.  Has been out of meds (all meds)  Has multiple new ideations which is out of character for the patient. He discussed the following:  Sister passed away in 2021-07-12, in [place]. He reports he believes she was killed by their family members for the insurance money ($1 million). He then had a dream in July 2023 where she told him that she was not buried and still in the morgue which upset him quite a bit.  He grew up in foster care along with his sister. He recently found out that his mother was a prostitute and  became pregnant with him from one of her clients. This father was wealthy and owned a company, which the father bequeathed to him (company and a house) over his half siblings. He reports that those siblings cheated him out of this inheritance unbeknownst to him.   The foster family he grew up with reportedly bequeathed their house to him as well and he reports that the same family members cheated him out of this as well, by forming a management company that took over the property.   He believes that all the friends he has made in [place] have turned out to be family members that have conspired to destroy his life and happiness. He believes he is being watched by cameras in his home and that his mobile phone is bugged. Due to feeling watched, he has missed doses of his Biktarvy. During our visit when I mentioned his HIV diagnosis, he refused to speak until we put his mobile phone out of the room so he could not be heard.  He also believes that his HIV AVS has been put on the internet to expose his HIV diagnosis as well as information about his genital wart treatment (surgical report?)        Past Medical History:   Diagnosis Date    Bicuspid aortic valve     mod-severe AI    Endocarditis 02/2018    S. 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 05/26/19 [name] 12g continuous infusion+Rifampin 300mg  Q8H, 06/25/19 Change to Cefazolin 2g Q8H+Rifampin, TTE 5/4 showed Paravalvular echo lucency measuring approximately 2cm wide as previously seen on TEE. It extends 180 degrees along the right border of the homograft. Appears to be dehiscence and rocking of the aortic homograft.    HIV disease (CMS-HCC) 2008    Dx. in [place]. Denies OI. History of nonadherence to ART. Last on Symtuza. Started on Dolutegravir 50mg  BID and Truvada daily.    Myocardial infarction (CMS-HCC)     Feb 2020    Stroke (CMS-HCC)          Social History  Background - Born and raised in [place], [place]. Moved to [place] with friend.    Housing - in apartment  by himself  School / Work & Benefits - on disability    Social History     Tobacco Use    Smoking status: Every Day     Packs/day: .25     Types: Cigarettes    Smokeless tobacco: Never    Tobacco comments:     4 cigarettes per day   Vaping Use    Vaping Use: Every day   Substance Use Topics    Alcohol use: Yes     Alcohol/week: 2.0 standard drinks of alcohol     Types: 2 Cans of beer per week     Comment: daily    Drug use: Not Currently     Types: Marijuana     Comment: Rare use         Review of Systems  As per HPI. All others negative.      Medications and Allergies  He has a current medication list which includes the following prescription(s): atorvastatin, biktarvy, enoxaparin, podofilox, and warfarin.    Allergies: Patient has no known allergies.      Family History  His family history includes Mental illness  in his mother; No Known Problems in his brother, father, and sister.          Objective:      Temp 36.7 ??C (98 ??F) (Temporal)  - Ht 182.9 cm (6')  - BMI 28.89 kg/m??   Wt Readings from Last 3 Encounters:   10/13/21 87.5 kg (193 lb)   10/13/21 (P) 87.7 kg (193 lb 6.4 oz)   08/25/20 96.6 kg (213 lb)      10/13/2021   Vitals - 1 value per visit    BP 138/85    BP 138/85    Heart Rate 92    Heart Rate 92    Temp 36.7 ??C (98 ??F)    Temp 36.7 ??C (98 ??F)    Resp 18    Weight (lb) 193 lbs    Weight (lb) 193 lbs 6 oz    Weight (kg) 87.544 kg    Weight (kg) 87.726 kg    Height IN (Length) 72 in    Height IN (Length) 72 in    Height (CM) (Length) 182.9 cm    Height (CM) (Length) 182.9 cm    BMI (Calculated) 26.17 kg/m2    BMI (Calculated) 26.22 kg/m2    BSA (Calculated - sq m) 2.11 m2    BSA (Calculated - sq m) 2.11 m2    Visit Report --    Visit Report --    Visit Report --    Visit Report --          Const Exhibiting paranoid/delusional thinking , noticeable weight loss   Eyes sclerae anicteric, noninjected OU   ENT no thrush, leukoplakia or oral lesions   Lymph no cervical or supraclavicular LAD   CV RRR. No murmurs. No rub or gallop. S1/S2.   Lungs CTAB ant/post, normal work of breathing   GI Soft, no organomegaly. NTND. NABS.   GU deferred   Rectal About 1.5in cluster of genital warts at about 11:30 position of anus   Skin no petechiae, ecchymoses or obvious rashes on clothed exam   MSK no joint tenderness and normal ROM throughout   Neuro CN grossly intact   Psych Tearful  affect. Eye contact good.  Fluent speech.     Laboratory Data  Reviewed in Epic today, using Synopsis and Chart Review filters.    Lab Results   Component Value Date    CREATININE 0.99 10/13/2021    QFTTBGOLD Negative 04/29/2020    HEPCAB Nonreactive 07/10/2019    CHOL 177 10/13/2021    HDL 43 10/13/2021    LDL 88 10/13/2021    NONHDL 134 (H) 10/13/2021    TRIG 228 (H) 10/13/2021    A1C 4.7 (L) 10/13/2021

## 2021-10-13 NOTE — Unmapped (Signed)
Internal Medicine Clinic Visit    Reason for Visit: Mechanical valve, HIV, paranoia/psychosis    A/P:    Paranoia (CMS-HCC) / Psychosis (CMS-HCC) / Altered mental status  Presenting with delusions of reference (someone on TV speaking to him) and paranoid delusions (belief people are watching him).  He stopped taking his medications (including Biktarvy) 1 month ago in the setting of this. Differential includes psychosis secondary to substances (recent cocaine use and marijuana) or other medical condition versus primary psychotic disorder. Given the rather acute nature and his older age of onset, favor brief psychotic disorder (possibly death of his sister as inciting event) or depression with psychosis rather than schizophrenia, etc. PHQ 9 score was only 2 today, though he was tearful throughout the visit.  He did not meet criteria voluntary commitment as he is not an immediate threat to himself or others. I have reached out to psychiatry pager and will send them a message to see if we can get him tucked into care. He did decline referral to psychiatry today as well as counseling (even when offered via the lens of grief counseling for his sister), but hopeful he will change his mind on this.  We will bring back for close follow-up.  Given his history of HIV, we will need to consider MRI and LP at his follow-up if he is still showing signs of paranoia.    - Vitamin B12 Level  - Toxicology Screen, Urine  - Ordered by ID: CMP, CBC, STI screening including RPR  - Ordered by me prior to visit: TSH,     Status post mechanical aortic valve replacement  He has been off warfarin for a long time.  We will start him back at 5 mg daily and bridge, bringing him back for anticoagulation clinic next week.  - enoxaparin (LOVENOX) 30 mg/0.3 mL Syrg; Inject 0.9 mL (90 mg total) under the skin Two (2) times a day.  Dispense: 54 mL; Refill: 0  - warfarin (JANTOVEN) 5 MG tablet; Take 1 tablet (5 mg total) by mouth daily.  Dispense: 30 tablet; Refill: 11  - Ambulatory referral to Cardiology; Future  - Echocardiogram W Colorflow Spectral Doppler; Future  - INR    Hyperlipidemia  Lipid panel ordered prior to visit to assess.  - atorvastatin (LIPITOR) 40 MG tablet; Take 1 tablet (40 mg total) by mouth daily.  Dispense: 90 tablet; Refill: 3    HIV infection (CMS-HCC)  Following with [facility].  Biktarvy delivered to his room by [facility] today.  Today we discussed strategies to get him back on Biktarvy and he feels there is a safe place in his house that he could take it daily.  ID is considering injectable therapy for him which I think could be very helpful.  - A1C    Return in about 2 weeks (around 10/27/2021).    I personally spent 80 minutes face-to-face and non-face-to-face in the care of this patient, which includes all pre, intra, and post visit time on the date of service.  __________________________________________________________    HPI:    46 year old man with a history of HIV, tobacco use, prior stroke, thrombocytopenia, endocarditis s/p mechanical aortic valve replacement who presents today to follow-up.    He reports no acute concerns. States he is here for follow up.     Saw ID earlier today.  Spoke with ID before his appointment and they were concerned that he was displaying new paranoia, which is very unusual for him.  HIV historically  well controlled but he has not been taking medications in the last month. He has fallen out of care this year.      When talking to the patient, he notes that his sister passed away in 07-25-2022.  He does not have any other support system. He reports being alone a lot of the time. He has not really talked to anyone about the death of his sister. He doesn't have anyone he can trust. He has God, but does not go to church.  He has concerns that he is being watched. He does not know who is watching him. He reports having 2 groups of people that he hangs out with.  He realized that they were conspiring with each other to get him out of the house so that the other group could be in his house and use his things.  He notes he discovered this after finding items in his house that did not belong to him (beers he does not normally drink, etc.).  He states he has not had auditory hallucinations.  However he does note that the TV is talking to him. For example, one day he was wearing a mask and someone on the TV said stop hiding. A friend called him from [place] and let him know (through coded language) that he was being watched in the shower. No changes in sleep. No visual or auditory hallucinations. Recent cocaine use. He thinks he may have lost some weight. He does feel a bit down with his sister passing, but no thoughts of hurting himself or others.     Aortic valve replacement: Not taken Coumadin in a long time. Feels comfortable with doing Lovenox shots; states he has done this in the past and does not need teaching. No chest pain, syncope, shortness of breath or LE edema. No longer taking metoprolol and not sure why he was on this.     Social: Lives in [name], ~50 minutes away. Benedetto Goad driver outside waiting for him. Lives alone. Sister passed in 07-24-2021. She was his main support. They grew up in foster care together but he found out she was his biological sibling in December.   __________________________________________________________    Problem List:  Patient Active Problem List   Diagnosis    Acute bacterial endocarditis    History of prosthetic aortic valve    HIV (human immunodeficiency virus infection) (CMS-HCC)    Bacteremia due to Staphylococcus aureus    Tobacco use disorder    AKI (acute kidney injury) (CMS-HCC)    Severe aortic insufficiency    HLD (hyperlipidemia)    CVA (cerebral vascular accident) (CMS-HCC)    Status post mechanical aortic valve replacement    Thrombocytopenia (CMS-HCC)    Other long term (current) drug therapy    Leukocytosis    Hx of repair of aortic root    History of antiretroviral therapy Complete heart block (CMS-HCC)    Closed displaced fracture of right femoral neck (CMS-HCC)    Aortic valve endocarditis    Acute postoperative pain    Acute blood loss anemia       Medications:  Reviewed in EPIC  __________________________________________________________    Physical Exam:   Vital Signs:  Vitals:    10/13/21 1620   BP: 138/85   BP Site: L Arm   BP Position: Sitting   BP Cuff Size: Large   Pulse: 92   Resp: 18   Temp: 36.7 ??C (98 ??F)   TempSrc: Temporal   Weight:  87.5 kg (193 lb)   Height: 182.9 cm (6')      Body mass index is 26.18 kg/m??.    Gen: Well appearing, A&O x 4, well kempt appearance, no acute distress but tearful when speaking about his sister  CV: RRR  Pulm: CTA bilaterally, no crackles or wheezes  Ext: No edema  Mental Status:  - Calm, warm, interactive behavior that fluctuated with periods of withdrawal (would get quiet at certain points in the visit and shut down--for example when asked if he has been taking HIV medications he looked to his phone and then just stopped talking)  - Good eye contact   - Normal speech  - Mood and affect congruent (a bit down and tearful when discussing hard things in his life)  - Thought process linear and goal directed, and logical at most points in the history (talking about his sister and health) but not delusions (when referring to TV or being watched).   - Evidence of paranoia and delusional thinking (outlined above). Does not appear to be responding to internal stimuli.   - Good attention and not distractible  - Fair insight into health (motivated to get back involved in care) but poor insight into other areas

## 2021-10-13 NOTE — Unmapped (Signed)
Contacted patient to make sure he was enroute on medicaid transportation for his appts at 1:30 and 3:00. There was no answer at the time of the call.

## 2021-10-14 LAB — LYMPH MARKER LIMITED,FLOW
ABSOLUTE CD3 CNT: 1104 {cells}/uL (ref 915–3400)
ABSOLUTE CD4 CNT: 416 {cells}/uL — ABNORMAL LOW (ref 510–2320)
ABSOLUTE CD8 CNT: 640 {cells}/uL (ref 180–1520)
CD3% (T CELLS): 69 % (ref 61–86)
CD4% (T HELPER): 26 % — ABNORMAL LOW (ref 34–58)
CD4:CD8 RATIO: 0.7 — ABNORMAL LOW (ref 0.9–4.8)
CD8% T SUPPRESR: 40 % — ABNORMAL HIGH (ref 12–38)

## 2021-10-14 LAB — HIV RNA, QUANTITATIVE, PCR
HIV RNA LOG10: 1.53 {Log_copies}/mL — ABNORMAL HIGH (ref <0.00)
HIV RNA QNT RSLT: DETECTED — AB
HIV RNA: 34 {copies}/mL — ABNORMAL HIGH (ref <0)

## 2021-10-14 LAB — SYPHILIS SCREEN: SYPHILIS RPR SCREEN: NONREACTIVE

## 2021-10-16 LAB — HEPATITIS C ANTIBODY: HEPATITIS C ANTIBODY: NONREACTIVE

## 2021-10-16 LAB — HEPATITIS A IGG: HEPATITIS A IGG: NONREACTIVE

## 2021-10-16 LAB — HEPATITIS B SURFACE ANTIBODY
HEPATITIS B SURFACE ANTIBODY QUANT: <8 m[IU]/mL (ref <8.00)
HEPATITIS B SURFACE ANTIBODY: NONREACTIVE

## 2021-10-16 LAB — HEPATITIS B CORE ANTIBODY, TOTAL: HEPATITIS B CORE TOTAL ANTIBODY: NONREACTIVE

## 2021-10-16 LAB — HEPATITIS B SURFACE ANTIGEN: HEPATITIS B SURFACE ANTIGEN: NONREACTIVE

## 2021-10-16 MED ORDER — BIKTARVY 50 MG-200 MG-25 MG TABLET
ORAL_TABLET | Freq: Every day | ORAL | 11 refills | 30 days | Status: CP
Start: 2021-10-16 — End: ?
  Filled 2021-12-25: qty 30, 30d supply, fill #0
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
## 2021-10-16 NOTE — Unmapped (Signed)
Social Work Environmental consultant    1. Patient income level: <300% FPL    2. Has the patient's income changed since your last RW application due to COVID-19 or other reasons? decreased    3.  Has the patient completed RW enrollment paperwork: Yes      *If no, have the patient speak with a RW benefit coordinator before they can use food pantry*    4.  What local food resources has the patient tried to access and what were the results? N/a    The patient is Eligible for the Food Pantry Program.     If patient meets above criteria and there are no alternative options for food assistance, then the patient can receive assistance through the ID Clinic.      Extra Screening:     I'm going to read you two statements that people have made about their food situation. For each statement, please tell me whether the statement was often true, sometimes true, or never true for your household in the last month.      1. ???We worried whether our food would run out before we got money to buy more.???   Often True     2. ???The food that we bought just didn't last, and we didn't have money to get more.  Often True    3. Do you follow a special diet?  No  4. Do you have a place to store food?  Yes  5. Do you have a place to cook food? Yes      **Warren [name] has given pt food bag as means of emergency foods/last resort. All other methods to receive food were exhausted prior to receiving food bag.

## 2021-10-16 NOTE — Unmapped (Signed)
Called patient several times since Friday. Left voicemail to discuss labs. Hoping he comes back to clinic for follow up soon.

## 2021-10-16 NOTE — Unmapped (Signed)
[facility] Nursing Note     TYPE OF ENCOUNTER: Phone    REASON: Follow up and  STI results     ASSESSMENT: RN attempted to reach out to patient to follow up and discuss positive Gonorrhea and Chlamydia results, along with the need for treatment. RN was not able to reach the patient at the time of the call. It went directly to VM, so RN left a VM. RN also sent the following text stating the following:    RN: [name], its Akili Cuda texting you from my work phone. Is everything ok? I tried to call you.     PLAN: RN will continue to try and contact patient.    CONTACT DURATION: 1 min

## 2021-10-17 LAB — TOXOPLASMA GONDII ANTIBODY, IGG: TOXOPLASMA GONDII IGG: NEGATIVE

## 2021-10-20 DIAGNOSIS — Z952 Presence of prosthetic heart valve: Principal | ICD-10-CM

## 2021-10-20 DIAGNOSIS — A630 Anogenital (venereal) warts: Principal | ICD-10-CM

## 2021-10-24 NOTE — Unmapped (Addendum)
[facility] Nursing Note          TYPE OF ENCOUNTER: Phone     REASON: Follow up and Results      ASSESSMENT: RN made another attempt to contact patient to give results and for general follow up. Phone went directly to VM. RN left VM requesting the patient to call back. RN sent the following text:     Dare, Please call or text back. It's Vyla Pint. Corazon and I have been trying to reach you. I hope that you are ok.      CONTACT DURATION: 1 min

## 2021-10-27 ENCOUNTER — Institutional Professional Consult (permissible substitution): Admit: 2021-10-27 | Discharge: 2021-10-28 | Payer: MEDICARE

## 2021-10-27 NOTE — Unmapped (Cosign Needed)
[facility]  Ep Cardiac Device Service  [address]   Tel: [phone]  Fax: [phone]  Visit Date:  10/27/2021    Order:       There are no diagnoses linked to this encounter.  MRI Consult with Implanted CIED  Patient is  approved for MRI.  Patient has a nonconditional device  Implants       No active implants to display in this view.            DEVICE INTERROGATION:     [id number]??? XT DR MRI (Pacemaker)  DEVICE SERIAL NUMBER  [id number]  DEVICE TYPE  Pacemaker  DATE OF IMPLANT  20-Mar-2018  Lead 1  Lead Model 4968 CapSure?? Epi  MR Conditional No  Lead Position Atrial  Lead Length 60 cm  Manufacturer Medtronic  Serial Number [id number]  Implant Date 20-Mar-2018  Lead 2  Lead Model greatbatch  MR Conditional No  Lead Position RV  Lead Length  Manufacturer - - -  Serial Number [id number]  Implant Date 20-Mar-2018          Prescreening Requirements  *The patient has no implanted lead extenders, lead adaptors, or abandoned leads-  *The patient has no broken leads or leads with intermittent electrical contact, as confirmed by lead impedance history >200 ohms and <1500 ohms   *Implanted for more than 6 weeks-unless EP Attending approval   *Device is operating within the projected service life-Cannot be done if EOS, ERI, near ERI without Ep Attending approval   *For patients whose device will be programmed to an asynchronous pacing mode when the MRI SureScan mode is programmed to On, no diaphragmatic stimulation is present when the paced leads have a pacing output of 5.0 V and a pulse width of 1.0 ms.  *Pacing capture thresholds ? 2.0 V at 0.4 ms-if dependent (pacing left on)        MR CONDITIONAL STATUS AND MANAGEMENT      MR NON-CONDITIONAL SYSTEM  Risks and Potential Complications discussed with patient and patient consents to procedure      Device will be programmed under the direction of the Ep Attending by the Ep Device RN

## 2021-10-30 DIAGNOSIS — B20 Human immunodeficiency virus [HIV] disease: Principal | ICD-10-CM

## 2021-10-30 DIAGNOSIS — R410 Disorientation, unspecified: Principal | ICD-10-CM

## 2021-10-31 NOTE — Unmapped (Signed)
[facility] Nursing Note        TYPE OF ENCOUNTER: Phone     REASON: Follow up and Results     ASSESSMENT: Pt. Tested positive for Gonorrhea and Chlamydia during his visit on 9/22, and needs treatment. Pt. Also needs CT scan scheduled.     ADHERENCE: Not assessed at this time.     INTERVENTION:  Pt. Was given result and scheduled to come in for treatment on 10/13. Pt. Missed his appointment with Dr. Glenetta Hew in Internal Medicine today. RN was able to reschedule patient's appt. To 10/13 at 11:05 am.     PLAN: Will contact Radiology on 10/10 to schedule and appointment for CT scan.    CONTACT DURATION: 10 min ( Includes coordination of care)

## 2021-10-31 NOTE — Unmapped (Signed)
[facility] Nursing Note    TYPE OF ENCOUNTER: Phone     REASON: CT scan scheduling      ASSESSMENT: Provider ordered a CT scan for the patient. RN contacted Lindalou Hose, financial counselor, to assess the need for authorization. Per Fleet Contras, authorization is not needed.     ADHERENCE: Pt. States that he is taking his medicine.     INTERVENTION:  CT was scheduled for 10/13 @ 1pm at [facility]. Pt. Was reminded that he also has appointments at 11:00 and 1:10 on the same day.     PLAN: Pt. Will contact Medicaid transportation today, to set up a ride. RN will contact patient on Thursday, to confirm that his ride was scheduled.     CONTACT DURATION: 20 min ( scheduling of CT scan)

## 2021-11-02 NOTE — Unmapped (Signed)
Error

## 2021-11-02 NOTE — Unmapped (Signed)
[facility] Nursing Note    TYPE OF ENCOUNTER: Phone     REASON: Appointment Reminder    ASSESSMENT: RN contacted patient to remind him of his appointments on 11/03/2021. Pt. Has an appointment with the his PCP at 11:05, CT scan at 1:00, and ID appt at 1:10.    ADHERENCE: Not assessed at this time     PLAN:  Pt. Confirmed that his medicaid transportation is set up for tomorrow, so he will be at all scheduled appointments     CONTACT DURATION: 5 minutes.

## 2021-11-02 NOTE — Unmapped (Signed)
Addended by: Varney Daily on: 11/02/2021 01:23 PM     Modules accepted: Orders

## 2021-11-03 ENCOUNTER — Institutional Professional Consult (permissible substitution): Admit: 2021-11-03 | Discharge: 2021-11-04 | Payer: MEDICARE

## 2021-11-03 ENCOUNTER — Ambulatory Visit: Admit: 2021-11-03 | Discharge: 2021-11-04 | Payer: MEDICARE

## 2021-11-03 DIAGNOSIS — F4321 Adjustment disorder with depressed mood: Principal | ICD-10-CM

## 2021-11-03 DIAGNOSIS — Z13820 Encounter for screening for osteoporosis: Principal | ICD-10-CM

## 2021-11-03 DIAGNOSIS — M8468XA Pathological fracture in other disease, other site, initial encounter for fracture: Principal | ICD-10-CM

## 2021-11-03 DIAGNOSIS — E559 Vitamin D deficiency, unspecified: Principal | ICD-10-CM

## 2021-11-03 DIAGNOSIS — Z952 Presence of prosthetic heart valve: Principal | ICD-10-CM

## 2021-11-03 DIAGNOSIS — Z8781 Personal history of (healed) traumatic fracture: Principal | ICD-10-CM

## 2021-11-03 MED ADMIN — azithromycin (ZITHROMAX) tablet 1,000 mg: 1000 mg | ORAL | @ 17:00:00 | Stop: 2021-11-03

## 2021-11-03 MED ADMIN — cefTRIAXone (ROCEPHIN) 500 mg, lidocaine (PF) (XYLOCAINE-MPF) 10 mg/mL (1 %) 1.4286 mL injection: 500 mg | INTRAMUSCULAR | @ 17:00:00 | Stop: 2021-11-03

## 2021-11-03 NOTE — Unmapped (Addendum)
Thank you for coming today!  Important to take all of your medications every day.     Ordered bone density scan to screen for osteoporosis or bone thinning  Please call [phone] to schedule your imaging.    Please have your cardiologist next week check your INR level  I will write this in my note as well

## 2021-11-03 NOTE — Unmapped (Signed)
Azithromycin 1000 mg administered po and Ceftriaxone 500 mg administered IM in right buttock per providers order. Pt tolerated the procedure well. Pt ate snack before  receiving his meds. This Clinical research associate gave pt condoms to take home with him. Advised no sexual activity for 10 days.Will send CD report.
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
## 2021-11-09 ENCOUNTER — Ambulatory Visit
Admit: 2021-11-09 | Discharge: 2021-11-10 | Payer: MEDICARE | Attending: Cardiovascular Disease | Primary: Cardiovascular Disease

## 2021-11-09 DIAGNOSIS — I442 Atrioventricular block, complete: Principal | ICD-10-CM

## 2021-11-09 NOTE — Unmapped (Unsigned)
Referring Provider: Bobbie Stack, MD  [address]  [address]  [place],  [place] [address]   Primary Provider: Bobbie Stack, MD  [address]  [place] [address]   Other Providers:  Dr Gust Rung EP Follow Up Note    Reason for Visit:  Warren Skemp. is a 46 y.o. male being seen for {Blank single:19197::hospital follow up,urgent visit for ***,routine visit and continued care of ***,routine visit and continued care of ischemic cardiomyopathy,routine visit and continued care of nonischemic cardiomyopathy}.    Assessment & Plan:    1.  S/p Medtronic dual chamber pacemaker  Device checked by device nurse  I have reviewed and agree with the findings  Normal device function  A pace -   V pace - ***  Events -   Battery -   Underlying Rhythm -   Device Dependent -   Continue remotes         Bicuspid aortic valve and endocarditis s/p Aortic valve replacement  MV repair   HLD  Past MI  CVA (2020)    ECG: {Blank single:19197::deferred,NSR,Afib,A paced,V paced,AV paced}.  See official ECG report.    Follow-up:  No follow-ups on file.    History of Present Illness:  Warren [name]. is a 46 y.o. 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      Allergies:  Patient has no known allergies.    Current Medications:    Current Outpatient Medications on File Prior to Visit   Medication Sig    atorvastatin (LIPITOR) 40 MG tablet Take 1 tablet (40 mg total) by mouth daily.    bictegrav-emtricit-tenofov ala (BIKTARVY) 50-200-25 mg tablet Take 1 tablet by mouth daily.    enoxaparin (LOVENOX) 30 mg/0.3 mL Syrg Inject 3 syringes (90 mg total) under the skin Two (2) times a day.    podofilox (CONDYLOX) 0.5 % gel Apply topically Two (2) times a day. Use for 3 days, hold for 4 days. Repeat as needed up to 4 times. (Patient not taking: Reported on 11/09/2021)    warfarin (JANTOVEN) 5 MG tablet Take 1 tablet (5 mg total) by mouth daily.     No current facility-administered medications on file prior to visit.       Family History:  The patient's family history includes Mental illness in his mother; No Known Problems in his brother, father, and sister.    Social history:  He  reports that he has been smoking cigarettes. He has a 7.00 pack-year smoking history. He has never used  smokeless tobacco. He reports current alcohol use of about 3.0 standard drinks of alcohol per week. He reports that he does not currently use drugs after having used the following drugs: Marijuana and Cocaine.    Review of Systems:  {Blank single:19197::As per HPI,As per HPI and as follows}.  Rest of the review of {Blank single:19197::twelve,ten} systems is negative or unremarkable except as stated above.    Physical Exam:  VITAL SIGNS: There were no vitals filed for this visit.   Wt Readings from Last 3 Encounters:   11/09/21 90.5 kg (199 lb 9.6 oz)   11/03/21 87.5 kg (193 lb)   10/13/21 87.5 kg (193 lb)      Today's There is no height or weight on file to calculate BMI.        GENERAL: {Blank single:19197::chronically ill-appearing in no acute distress,well-appearing in no acute distress}  HEENT: Normocephalic and atraumatic. Conjunctivae and sclerae pack-year smoking history. He has never used smokeless tobacco. He reports current alcohol use of about 3.0 standard drinks of alcohol per week. He reports that he does not currently use drugs after having used the following drugs: Marijuana and Cocaine.    Review of Systems:  As per HPI and as follows.  Rest of the review of ten systems is negative or unremarkable except as stated above.    Physical Exam:  VITAL SIGNS:   Vitals:    11/10/21 1115   BP: 115/78   Pulse: 92   Temp: 35.9 ??C (96.7 ??F)   SpO2: 95%      Wt Readings from Last 3 Encounters:   11/10/21 89.4 kg (197 lb)   11/09/21 90.5 kg (199 lb 9.6 oz)   11/03/21 87.5 kg (193 lb)      Today's Body mass index is 26.72 kg/m??.        GENERAL: well-appearing in no acute distress  HEENT: Normocephalic and atraumatic. Conjunctivae and sclerae clear and anicteric.    NECK: Supple.   CARDIOVASCULAR: Rate and rhythm are regular.  Normal S1, S2. There is no murmur, gallops or rubs  RESPIRATORY: Normal respiratory effort..  There are no wheezes.  ABDOMEN: Soft, non-tender, Abdomen nondistended.    EXTREMITIES:  There is no pedal edema, bilaterally.   SKIN: No rashes, ecchymosis or petechiae.  Warm, well perfused. Well healed left sided ICD scar   NEURO/PSYCH: Alert and oriented x 3. Affect appropriate.  Nonfocal    Pertinent Laboratory Studies:   Office Visit on 11/09/2021   Component Date Value Ref Range Status    EKG Ventricular Rate 11/09/2021 89  BPM Incomplete    EKG Atrial Rate 11/09/2021 89  BPM Incomplete    EKG P-R Interval 11/09/2021 178  ms Incomplete    EKG QRS Duration 11/09/2021 172  ms Incomplete    EKG Q-T Interval 11/09/2021 406  ms Incomplete    EKG QTC Calculation 11/09/2021 493  ms Incomplete    EKG Calculated P Axis 11/09/2021 63  degrees Incomplete    EKG Calculated R Axis 11/09/2021 176  degrees Incomplete    EKG Calculated T Axis 11/09/2021 22  degrees Incomplete    QTC Fredericia 11/09/2021 462  ms Incomplete   Office Visit on 10/13/2021 493  ms Preliminary    EKG Calculated P Axis 11/09/2021 63  degrees Preliminary    EKG Calculated R Axis 11/09/2021 176  degrees Preliminary    EKG Calculated T Axis 11/09/2021 22  degrees Preliminary    QTC Fredericia 11/09/2021 462  ms Preliminary   Office Visit on 10/13/2021   Component Date Value Ref Range Status    Vitamin B-12 10/13/2021 1,185 (H)  211 - 911 pg/ml Final    Amphetamines Screen, Ur 10/13/2021 Negative  <500 ng/mL Final    Barbiturates Screen, Ur 10/13/2021 Negative  <200 ng/mL Final    Benzodiazepines Screen, Urine 10/13/2021 Negative  <200 ng/mL Final    Cannabinoids Screen, Ur 10/13/2021 Positive (A)  <20 ng/mL Final    Methadone Screen, Urine 10/13/2021 Negative  <300 ng/mL Final    Cocaine(Metab.)Screen, Urine 10/13/2021 Positive (A)  <150 ng/mL Final    Opiates Screen, Ur 10/13/2021 Negative  <300 ng/mL Final    Fentanyl Screen, Ur 10/13/2021 Negative  <1.0 ng/mL Final    Oxycodone Screen, Ur 10/13/2021 Negative  <100 ng/mL Final    Buprenorphine, Urine 10/13/2021 Negative  <5 ng/mL Final   Office Visit on 10/13/2021   Component Date Value Ref Range Status    ALT 10/13/2021 13  10 - 49 U/L Final    AST 10/13/2021 20  <=34 U/L Final    Total Bilirubin 10/13/2021 0.7  0.3 - 1.2 mg/dL Final    Sodium 16/10/9602 144  135 - 145 mmol/L Final    Potassium 10/13/2021 3.9  3.4 - 4.8 mmol/L Final    Chloride 10/13/2021 111 (H)  98 - 107 mmol/L Final    CO2 10/13/2021 24.9  20.0 - 31.0 mmol/L Final    Anion Gap 10/13/2021 8  5 - 14 mmol/L Final    BUN 10/13/2021 10  9 - 23 mg/dL Final    Creatinine 54/09/8117 0.99  0.60 - 1.10 mg/dL Final    BUN/Creatinine Ratio 10/13/2021 10   Final    eGFR CKD-EPI (2021) Male 10/13/2021 >90  >=60 mL/min/1.53m2 Final    Glucose 10/13/2021 91  70 - 179 mg/dL Final    Calcium 14/78/2956 9.1  8.7 - 10.4 mg/dL Final    HIV RNA Quant Result 10/13/2021 Detected (A)  Not Detected Final    HIV RNA 10/13/2021 34 (H)  <0 copies/mL Final    HIV RNA Log10 10/13/2021 1.53 (H) <0.00 log copies/mL Final    RPR 10/13/2021 Nonreactive  Nonreactive Final    Chlamydia trachomatis, NAA 10/13/2021 Positive (A)  Negative Final    Gonorrhoeae NAA 10/13/2021 Positive (A)  Negative Final    CT/GC Specimen Type 10/13/2021 Swab   Final    CT/GC Specimen Source 10/13/2021 Rectum   Final    Chlamydia trachomatis, NAA 10/13/2021 Negative  Negative Final    Gonorrhoeae NAA 10/13/2021 Negative  Negative Final    CT/GC Specimen Type 10/13/2021 Swab   Final    CT/GC Specimen Source 10/13/2021 Throat   Final    Chlamydia trachomatis, NAA 10/13/2021 Negative  Negative Final    Gonorrhoeae NAA 10/13/2021 Negative  Negative Final    CT/GC Specimen Type 10/13/2021 Urine   Final    CT/GC Specimen Source 10/13/2021 Urine (Male)   Final    Creat U 10/13/2021 156.7  Undefined mg/dL Final    Albumin Quantitative, Urine 10/13/2021 <0.3  Undefined mg/dL Final    Albumin/Creatinine Ratio 10/13/2021    Final    Hepatitis C Ab 10/13/2021 Nonreactive  Nonreactive Final    Hep B Core Total Ab 10/13/2021 Nonreactive  Nonreactive Final    Hep B S Ab 10/13/2021 Nonreactive  Nonreactive, Grayzone Final    Hep B Surf Ab Quant 10/13/2021 <8.00  <8.00 m(IU)/mL Final    Hep  B Surface Ag 10/13/2021 Nonreactive  Nonreactive Final    Hep A IgG 10/13/2021 Nonreactive  Nonreactive Final    TSH 10/13/2021 1.698  0.550 - 4.780 uIU/mL Final    PT 10/13/2021 10.7  9.8 - 12.8 sec Final    INR 10/13/2021 0.95   Final    Hemoglobin A1C 10/13/2021 4.7 (L)  4.8 - 5.6 % Final    Estimated Average Glucose 10/13/2021 88  mg/dL Final    Triglycerides 10/13/2021 228 (H)  0 - 150 mg/dL Final    Cholesterol 16/10/9602 177  <=200 mg/dL Final    HDL 54/09/8117 43  40 - 60 mg/dL Final    LDL Calculated 10/13/2021 88  40 - 99 mg/dL Final    VLDL Cholesterol Cal 10/13/2021 45.6  11 - 50 mg/dL Final    Chol/HDL Ratio 10/13/2021 4.1  1.0 - 4.5 Final    Non-HDL Cholesterol 10/13/2021 134 (H)  70 - 130 mg/dL Final    FASTING 14/78/2956 Unknown   Final    WBC 10/13/2021 6.6  3.6 - 11.2 10*9/L Final    RBC 10/13/2021 5.23  4.26 - 5.60 10*12/L Final    HGB 10/13/2021 15.1  12.9 - 16.5 g/dL Final    HCT 21/30/8657 47.3  39.0 - 48.0 % Final    MCV 10/13/2021 90.3  77.6 - 95.7 fL Final    MCH 10/13/2021 28.8  25.9 - 32.4 pg Final    MCHC 10/13/2021 31.9 (L)  32.0 - 36.0 g/dL Final    RDW 84/69/6295 13.2  12.2 - 15.2 % Final    MPV 10/13/2021 7.3  6.8 - 10.7 fL Final    Platelet 10/13/2021 281  150 - 450 10*9/L Final    Neutrophils % 10/13/2021 63.7  % Final    Lymphocytes % 10/13/2021 25.1  % Final    Monocytes % 10/13/2021 8.4  % Final    Eosinophils % 10/13/2021 1.6  % Final    Basophils % 10/13/2021 1.2  % Final    Absolute Neutrophils 10/13/2021 4.2  1.8 - 7.8 10*9/L Final    Absolute Lymphocytes 10/13/2021 1.6  1.1 - 3.6 10*9/L Final    Absolute Monocytes 10/13/2021 0.5  0.3 - 0.8 10*9/L Final    Absolute Eosinophils 10/13/2021 0.1  0.0 - 0.5 10*9/L Final    Absolute Basophils 10/13/2021 0.1  0.0 - 0.1 10*9/L Final    CD3% (T Cells) 10/13/2021 69  61 - 86 % Final    Absolute CD3 Count 10/13/2021 1,104  915 - 3,400 /uL Final    CD4% (T Helper) 10/13/2021 26 (L)  34 - 58 % Final    Absolute CD4 Count 10/13/2021 416 (L)  510 - 2,320 /uL Final    CD8% T Suppressor 10/13/2021 40 (H)  12 - 38 % Final    Absolute CD8 Count 10/13/2021 640  180 - 1,520 /uL Final    CD4:CD8 Ratio 10/13/2021 0.7 (L)  0.9 - 4.8 Final    Toxoplasma Gondii IgG 10/13/2021 Negative  Negative Final       Lab Results   Component Value Date    Creatinine 0.99 10/13/2021    Creatinine 1.07 08/25/2020    BUN 10 10/13/2021    BUN 11 08/25/2020    Sodium 144 10/13/2021    Potassium 3.9 10/13/2021    CO2 24.9 10/13/2021    Magnesium 2.2 06/02/2019    Total Bilirubin 0.7 10/13/2021    INR, POC 1.0 08/25/2020    INR  0.95 10/13/2021       No results found for: DIGOXIN    Lab Results   Component Value Date    TSH 1.698 10/13/2021    Cholesterol 177 10/13/2021    Triglycerides 228 (H) 10/13/2021    HDL 43 10/13/2021    Non-HDL Cholesterol 134 (H) 10/13/2021    LDL Calculated 88 10/13/2021       Lab Results   Component Value Date    WBC 6.6 10/13/2021    HGB 15.1 10/13/2021    HCT 47.3 10/13/2021    Platelet 281 10/13/2021         Past Medical History:   Diagnosis Date    Bicuspid aortic valve     mod-severe AI    Endocarditis 02/2018    S. 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 05/26/19 [name] 12g continuous infusion+Rifampin 300mg  Q8H, 06/25/19 Change to Cefazolin 2g Q8H+Rifampin, TTE 5/4 showed Paravalvular echo lucency measuring approximately 2cm wide as previously seen on TEE. It extends 180 degrees along the right border of the homograft. Appears to be dehiscence and rocking of the aortic homograft.    HIV disease (CMS-HCC) 2008    Dx. in [place]. Denies OI. History of nonadherence to ART. Last on Symtuza. Started on Dolutegravir 50mg  BID and Truvada daily.    Myocardial infarction (CMS-HCC)     Feb 2020    Stroke (CMS-HCC)        Past Surgical History:   Procedure Laterality Date    CARDIAC PACEMAKER PLACEMENT  03/20/2018    Epicardial pacemaker placed for bradycardia     CARDIAC SURGERY  02/2018    CARDIAC VALVE REPLACEMENT  02/2018    S/p aortic root replacement with homograft 25mm, reconstruction of aortomitral continuity, repair of right atrial fistula, mitral valve repair

## 2021-11-09 NOTE — Unmapped (Signed)
Cardiology Consultation Note    Requesting Provider: Johnsie Cancel,*   Primary Provider: Bobbie Stack, MD     Reason for Consult:   This 46 y.o. male is seen at the request of Johnsie Cancel, MD for evaluation after mechanical aortic valve replacement.     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 No chest pain or heart failure symptoms   #Moderate to severe MR s/p MV repair   #Severe bradycardia s/p PPM: has been undergoing remote monitoring and demonstrated normal pacemaker function  #Previous embolic stroke: no residual deficits that he is aware of    #HLD  #Tobacco abuse: not interest in cessation at this time    #HIV    Follow up with transthoracic echocardiogram   Counseled patient on the importance of continuing warfarin and the bridge with lovenox to maintain an INR of 2-3  Scheduled to return next week for an INR check       Follow-up: Return in about 6 months (around 05/11/2022).      Dictated using Animal nutritionist, please excuse typos      History of Present Illness:    Warren [name]. is a 46 y.o. 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    Patient reports that he is transitioning his care to [facility] from [facility]. He has a complicated history of recurrent endocarditis and 2 sternotomies with mechanical valve in the aortic position and mitral valve repair. He does not recall all the details related to these events; however, feels as if he has recovered well from a cardiovascular standpoint. He admits that he has not taken his warfarin or lovenox for 2-3 days.     He has had no fevers, chills, nausea, vomiting, chest pain, shortness of breath, orthopnea, PND, or lower extremity swelling.     He smoked about 1/2 ppd and is not interested in cessation at this point   Does not know his family history     Cardiovascular History:  Bicuspid aortic valve and endocarditis s/p Aortic valve replacement and root repair   MV repair   Severe brachycardia s/p PPM  HLD  CVA (2020)  HIV    Interventions / Surgery:  Mechanical Aortic Valve Replacement (07/21/19)  -Mechanic Onyx 27/32mm valve visualized in AV position with root replacement mean gradient well seated   IMPLANTS:   Implant Name Type Inv. Item Serial No. Manufacturer Lot No. LRB No. Used Action   GRAFT, VASC GELWEAVE VALSALVA [id number] - [id number] Wynetta Emery VALSALVA 32MMX15CM [id number] [employer] [place] [id number] 1 Implanted   VALVE, AORTIC ON-X W/RING 27-29MM - [id number] VALVE, AORTIC ON-X W/RING 27-29MM [id number] ON-X [employer] N/A 1 Implanted   - Normal Biventricular function LV EF >55% on inotropic support   -Trace TR   -Trace MR   - Trace PI.   -No dissections visualized   ICD implantation  Imaging:  CT Angio Chest Aorta  05/18/19  Cardiovascular: Heart is normal size. Aorta normal caliber measuring   maximally 3.6 cm at the sinuses of Valsalva. There is abnormal   collection of contrast noted which extends from the aortic outflow   tract just proximal to the anterior leaflet of the aortic valve.   This is concerning for ruptured aortic outflow tract or   pseudoaneurysm. This appears contained within area of soft tissue   measuring approximately 4.2 x 3.0 cm near the aortic valve.   Stranding noted around the ascending thoracic aorta. Echo  08/30/19   1. Normal function of mechanical aortic prosthesis. s/p redo root replacement for recurrent infective endocarditis with Bentall procedure: #27-89mm mechanical On-X valve, #32 Valsalva conduit via redo sternotomy on 07/21/2019 at [facility]. AT 92 msec. DVI   0.69, EOA 2.9, iEOA 1.42 cm2/m2. Normal mechanical bileaflet motion. The aortic valve has been repaired/replaced. Aortic valve regurgitation is trivial, likely normal washing jet. There is a mechanical valved conduit present in the aortic position.   Procedure Date: 07/21/19.    2. Left ventricular ejection fraction, by estimation, is 50 to 55%. The left ventricle has low normal function. The left ventricle has no regional wall motion abnormalities. There is mild left ventricular hypertrophy. Left ventricular diastolic   parameters are indeterminate.    3. Right ventricular systolic function is normal. The right ventricular size is normal. There is normal pulmonary artery systolic pressure. The estimated right ventricular systolic pressure is 21.3 mmHg.    4. Right atrial size was mildly dilated.    5. The mitral valve has been repaired. Trivial mitral valve regurgitation. No evidence of mitral stenosis. The mean mitral valve gradient is 2.4 mmHg with average heart rate of 89 bpm. There is a repair present in the mitral position. Procedure Date:   02/2018.    6. Aortic root/ascending aorta has been repaired/replaced.    7. The inferior vena cava is normal in size with greater than 50% respiratory variability, suggesting right atrial pressure of 3 mmHg.   07/26/19 TEE  -Mechanic Onyx 27/26mm valve visualized in AV position with root replacement mean gradient well seated   - Normal Biventricular function LV EF >55% on inotropic support   -Trace TR   -Trace MR   - Trace PI.   -No dissections visualized   05/26/19   1. S/P aortic homograft 2020. Paravalvular echo lucency measuring  approximately 2cm wide as previously seen on TEE. It extends 180 degrees along  the right border of the homograft. There appears to be dehiscence and rocking  of the aortic homograft.    2. There is mild to moderate regurgitation of the prosthetic aortic valve.    3. The left ventricle is normal in size with normal wall thickness.    4. The left ventricular systolic function is mildly decreased, LVEF is  visually estimated at 45-50%.    5. There is decreased contractile function involving the apical segment(s).    6. There is mild mitral valve regurgitation.    7. The right ventricle is normal in size, with normal systolic function.    8. There is no evidence of a significant pericardial effusion.  05/13/19 TEE  1. Left ventricular ejection fraction, by estimation, is 60 to 65%. The left ventricle has normal function. The left ventricle has no regional wall motion abnormalities.    2. Right ventricular systolic function is normal. The right ventricular size is normal.    3.  No left atrial/left atrial appendage thrombus was detected.    4. The mitral valve is normal in structure. No evidence of mitral valve regurgitation. No evidence of mitral stenosis.    5. 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 The aortic valve is tricuspid. Aortic valve regurgitation is mild to moderate. No aortic stenosis is present.    6. The inferior vena cava is normal in size with greater than 50% respiratory variability, suggesting right atrial pressure of 3 mmHg.         Past Medical & Surgical History:  Past Medical History:   Diagnosis Date    Bicuspid aortic valve     mod-severe AI    Endocarditis 02/2018    S. 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 05/26/19 [name] 12g continuous infusion+Rifampin 300mg  Q8H, 06/25/19 Change to Cefazolin 2g Q8H+Rifampin, TTE 5/4 showed Paravalvular echo lucency measuring approximately 2cm wide as previously seen on TEE. It extends 180 degrees along the right border of the homograft. Appears to be dehiscence and rocking of the aortic homograft.    HIV disease (CMS-HCC) 2008    Dx. in [place]. Denies OI. History of nonadherence to ART. Last on Symtuza. Started on Dolutegravir 50mg  BID and Truvada daily.    Myocardial infarction (CMS-HCC)     Feb 2020    Stroke (CMS-HCC)         Allergies:  No Known Allergies     Current Medications:    Current Outpatient Medications:     atorvastatin (LIPITOR) 40 MG tablet, Take 1 tablet (40 mg total) by mouth daily., Disp: 90 tablet, Rfl: 3    bictegrav-emtricit-tenofov ala (BIKTARVY) 50-200-25 mg tablet, Take 1 tablet by mouth daily., Disp: 30 tablet, Rfl: 11    enoxaparin (LOVENOX) 30 mg/0.3 mL Syrg, Inject 3 syringes (90 mg total) under the skin Two (2) times a day., Disp: 54 mL, Rfl: 0    warfarin (JANTOVEN) 5 MG tablet, Take 1 tablet (5 mg total) by mouth daily., Disp: 30 tablet, Rfl: 11    podofilox (CONDYLOX) 0.5 % gel, Apply topically Two (2) times a day. Use for 3 days, hold for 4 days. Repeat as needed up to 4 times. (Patient not taking: Reported on 11/09/2021), Disp: 3.5 g, Rfl: 0    Social History:  Social History     Tobacco Use    Smoking status: Every Day     Packs/day: 0.25     Years: 28.00     Additional pack years: 0.00     Total pack years: 7.00     Types: Cigarettes    Smokeless tobacco: Never    Tobacco comments:     4 cigarettes per day   Substance Use Topics    Alcohol use: Yes     Alcohol/week: 3.0 standard drinks of alcohol     Types: 3 Cans of beer per week   He reports that he does not currently use drugs after having used the following drugs: Marijuana and Cocaine.    Family History:  His family history includes Mental illness in his mother; No Known Problems in his brother, father, and sister.    Review of Systems:  Review of ten systems is negative or unremarkable except as stated above.    Physical Exam:  VITAL SIGNS: BP 108/79 (BP Site: L  Arm, BP Position: Sitting, BP Cuff Size: Large)  - Pulse 98  - Resp 16  - Ht 182.9 cm (6')  - Wt 90.5 kg (199 lb 9.6 oz)  - SpO2 100%  - BMI 27.07 kg/m??   GENERAL: NAD.  HEENT: Normocephalic and atraumatic. Conjunctivae and sclerae clear and anicteric. No xanthelasma.   NECK: Supple, without masses, thyroid enlargement or adenopathy.  CARDIOVASCULAR: RRR. Normal S1 and S2. Mechanical valve click noted loudest in the RUSB. No murmurs, rubs, or gallops.   RESPIRATORY: Normal respiratory effort without use of accessory muscles. Clear to auscultation bilaterally.  ABDOMEN: Soft, not tender or distended, with audible bowel sounds. No palpable organ enlargement or abnormal masses.  EXTREMITIES:  No pretibial or ankle edema. Ambulatory ability satisfactory.  SKIN: No rashes, ecchymosis or petechiae.  NEUROLOGIC: Appropriate mood and affect. Alert and oriented to person, place, and time. No gross motor or sensory deficits evident.    Wt Readings from Last 12 Encounters:   11/09/21 90.5 kg (199 lb 9.6 oz)   11/03/21 87.5 kg (193 lb)   10/13/21 87.5 kg (193 lb)   10/13/21 (P) 87.7 kg (193 lb 6.4 oz)   08/25/20 96.6 kg (213 lb)   08/25/20 96.7 kg (213 lb 3.2 oz)   04/29/20 (!) 101.5 kg (223 lb 12.8 oz)   11/03/19 97 kg (213 lb 12.8 oz)   10/08/19 94.8 kg (209 lb)   10/01/19 94.8 kg (209 lb)   09/18/19 92.9 kg (204 lb 12.8 oz)   08/21/19 92.1 kg (203 lb)       Pertinent Laboratory Studies:  Lab Results   Component Value Date    Triglycerides 228 (H) 10/13/2021    Triglycerides 561 (H) 08/25/2020    Triglycerides 256 (H) 05/25/2019    HDL 43 10/13/2021    HDL 28 (L) 08/25/2020    HDL 29 (L) 05/25/2019    Non-HDL Cholesterol 134 (H) 10/13/2021    Non-HDL Cholesterol 139 (H) 08/25/2020    Non-HDL Cholesterol 105 05/25/2019    LDL Calculated 88 10/13/2021    LDL Calculated  08/25/2020      Comment:      NHLBI Recommended Ranges, LDL Cholesterol, for Adults (20+yrs) (ATPIII), mg/dL  Optimal              <045  Near Optimal 100-129  Borderline High     130-159  High                160-189  Very High            >=190  NHLBI Recommended Ranges, LDL Cholesterol, for Children (2-19 yrs), mg/dL  Desirable            <409  Borderline High     110-129  High                 >=130    Unable to calculate due to triglyceride greater than 400 mg/dL.  Unable to calculate due to triglyceride greater than 400 mg/dL.  Unable to calculate due to triglyceride greater than 400 mg/dL.    LDL Calculated 54 (L) 05/25/2019    Creatinine 0.99 10/13/2021    BUN 10 10/13/2021    Potassium 3.9 10/13/2021    Magnesium 2.2 06/02/2019    WBC 6.6 10/13/2021    HGB 15.1 10/13/2021    HCT 47.3 10/13/2021    Platelet 281 10/13/2021    INR, POC 1.0 08/25/2020    INR, POC 1.0 12/09/2019  INR 0.95 10/13/2021    INR 0.97 04/29/2020       Pertinent Test Results:         Please excuse typos, dictation completed with Dragon voice recognition software    Milas Gain, MD  [facility] Interventional Cardiology Fellow, PGY-7  Pager: [phone]       Elpidio Anis  MD [facility]  Division of Cardiology   [facility] [facility]   Office [phone]   Pager [phone]

## 2021-11-10 ENCOUNTER — Ambulatory Visit: Admit: 2021-11-10 | Discharge: 2021-11-11 | Payer: MEDICARE | Attending: Family | Primary: Family

## 2021-11-10 ENCOUNTER — Institutional Professional Consult (permissible substitution)
Admit: 2021-11-10 | Discharge: 2021-11-11 | Payer: MEDICARE | Attending: Nurse Practitioner | Primary: Nurse Practitioner

## 2021-11-10 DIAGNOSIS — I442 Atrioventricular block, complete: Principal | ICD-10-CM

## 2021-11-10 DIAGNOSIS — Z45018 Encounter for adjustment and management of other part of cardiac pacemaker: Principal | ICD-10-CM

## 2021-11-10 NOTE — Unmapped (Signed)
[facility]  Cardiac Device Clinic-  [place]  Tel: [phone]  Fax: [phone]    Cardiac Implanted Electronic Device Evaluation-In Person Outpatient Visit    Visit Date:  11/10/2021    Manufacturer of Device: Medtronic     Model: Azure XT DR MRI [id number]  Type of Device: Dual Chamber Pacemaker  Indication for Implant: CHF  Implant date:03/20/2018    Battery: 5.9 years estimated longevity    Mode: MVPR (AAIR-/-DDDR)  Lower rate limit: 90 bpm  Upper Tracking Limit: 130 bpm    Presenting rhythm:  AS/VP  Underlying rhythm: CHB   Dependent: Yes    Pacing Percentages  AP: 53.7  VP: 100    Impedence (ohms)  Atrial lead: 608  Right Ventricular lead: 285     Sensing (mV)  Atrial lead: 4.0 mV  Right Ventricular lead: Paced     Capture threshold (V @ ms)  Atrial lead: 0.75V @ 0.40 ms  Right Ventricular lead: 1.00 V @ 0.40 ms    Diagnostics/Episodes  (29) AT/AF    Fluid Monitoring   Not Applicable    Reprogramming  None    Follow-up Plan:  Schedule Clinic Follow-Up in 1 year.  Patient to setup home remote monitoring with Medtronic.  Patient states that he his currently enrolled at [facility] for monitoring.       Please see scanned and / or downloaded PDF file in this patient's chart for this encounter for full details of device interrogation and reprogramming.

## 2021-11-10 NOTE — Unmapped (Signed)
Contacted patient via mobile and confirmed two identifiers.  Patient canceled appointment today. He agrees to be seen on Monday, 10/23 at 11:30am instead. Has Echo scheduled at main hospital. No further questions or concerns at this time.

## 2021-11-13 ENCOUNTER — Ambulatory Visit: Admit: 2021-11-13 | Discharge: 2021-11-14 | Payer: MEDICARE

## 2021-11-13 ENCOUNTER — Ambulatory Visit: Admit: 2021-11-13 | Discharge: 2021-11-14 | Payer: MEDICARE | Attending: Family | Primary: Family

## 2021-11-13 NOTE — Unmapped (Unsigned)
[facility]  [address]  [place], [place]  [address]  P [phone]  F [phone]     Primary care provider: Bobbie Stack, MD    Assessment/Plan:      HIV (dx'd July 25, 2006)  - chronic, stable   According to notes, patient was diagnosed in 2006/07/25 after routine STI testing.   Denies prior opportunistic infections.  Has been non adherent with ART in the past. Last took [place].  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  Patient had been out of care since moving to [place] from [place] in Jul 25, 2018.  In Medical Case Management with IExcell Seltzer, RN in light of complex medical history  Has not been coming to appointments due to limitations in transportation.    Overall doing well. Current regimen: Biktarvy (BIC/FTC/TAF)  Misses doses of ARVs rarely    Med access via Medicaid  CD4 count  200's-300's  Discussed ARV adherence and taking ARVs with food    Lab Results   Component Value Date    ACD4 416 (L) 10/13/2021    CD4 26 (L) 10/13/2021    HIVRS Detected (A) 10/13/2021    HIVCP 34 (H) 10/13/2021     CD4, HIV RNA, and safety labs (full return panel)  Continue current therapy. On Biktarvy, has not been taking Bitkarvy consistently due to being paranoid that he is being watched. Sent script to [facility] to get 30 day supply today. Francisca December, RN has picked up meds from pharmacy and handed it to patient during his IM appointment.  Encouraged continued excellent ARV adherence  Now has better insurance. Would like to be on Cabenuva. Stressed importance of patient attending ID appointments consistently since missed doses of Cabenuva can certainly lead to resistance. He verbalized understanding of this and reports he can now attend his appointments consistently now that he has a new transportation provider.  Added toxo IgG on 10/16/21.      Genital warts  - chronic, stable  Diagnosed in [place]. Evaluated with previous PCP in [place], first treated with cryotherapy and then Condylox without relief.   Seen by Dr. Neysa Hotter who agreed with need for surgical intervention and biopsy. Seen by pre-op clinical for evaluation in 09/2019.  Will ask I. Baker to help facilitate another appointment with Dr. Neysa Hotter to again address patient's genital warts.  On exam, noted cluster of genital warts to about 11:30 position of anus. No other small genital warts noted.  Patient previously seen by Dr. Neysa Hotter in 09/2019 and scheduled for surgical intervention but patient unable to follow up due to inconsistent transportation. He now gets transportation through Medicare which is more reliable and would like genital warts addressed.  Patient requested Condylox to bridge him to the appointment with Dr. Neysa Hotter and reviewed instructions on it's use. He verbalized understanding and will follow directions as noted.      Delusional/paranoid thinking - new  Patient came to [name] from [place] in mid 07-25-18. Started having complex medical issues that lead to major heart surgery  Had girlfriend he somewhat depended on during this time but now that relationship has ended under bad circumstances.  He has moved into another apartment but had no transportation besides Medicaid transport which was unreliable but now using Medicare transportation.  See HPI below for details and Dr. Shiela Mayer note from today (10/13/21).  Seeming new onset of paranoia and delusions. Recent death of his sister in 07/24/2021. Recent marijuana and cocaine use. Noticeable weight  loss.  Patient often tearful during the visit.  Denies SI or HI. He is pre-occupied with being watched from all aspects of his life by friends/family who he believes has conspired to ruin his life. He believes there's cameras in his home, and his cell phone is bugged. Reports recent history of smoking marijuana and snorted cocaine a few days ago.  SW team and Gwynneth Albright, RN are aware of this. Due to paranoid thoughts, SW team recommended limiting the number of caregivers the patient comes in contact with through today's visit.  He has appointment with Internal Medicine after this appointment and warm hand-off given to Dr. Delane Ginger who was seeing him today.  See her note for further plan and close follow up.   Current symptoms are out of character for the patient.   Discussed case with Dr. Cardell Peach who has low suspicion for HIV-related etiology of mental status changes. However, in setting of seeming acute symptoms and weight loss, should rule out MRI brain with/without contrast to rule out CNS Lymphoma.  Order placed. Able to secure appointment for MRI on 10/30/21 at 7:45am at spine center, same day as PCP appointment.      Hyperlipidemia - chronic, progressive  Patient had been taking lipitor but had run out.  Refilled today by PCP  Obtained lipid panel and A1C today per Dr. 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 TEE on 4/21 showed a 2x2 cm vegetation on the non-coronary cusp on the aortic valve with mild-mod AI.  Treated with Vancomycin and Cefepime, then narrowed to Cefazolin.  Per discharge summary on 06/02/19:   CTA on 05/18/19 showed a contained rupture or pseudoaneurysm of the aortic outflow tract measuring 4.2 x 3.0 cm proximal to the anterior leaflet of the aortic valve with possible aortic root thrombus. Previous TEE from OSH 4/27 showed EF  60-65% with severe AI. Pacemaker evaluation showed Medtronic Azure Dual Chamber Pacemaker, EKG & telemetry demonstrating a-sensed, v-paced rhythm. Repeat TTE 5/4 showed Paravalvular echo lucency measuring approximately 2cm wide as previously seen on TEE. It extends 180 degrees along the right border of the homograft. There appears to be dehiscence and rocking of the aortic homograft. His status is tenuous with extremely high risk of decompensation & death should the graft fail.   Transferred to [facility] on 05/26/19, antibiotics changed to [name] 2 g q 4h x 6 weeks (05/26/19-07/05/19), Gentamicin x 2 weeks (05/26/19-06/13/19, completed) and Rifampin 300mg  q8h x 6 weeks (05/26/19-07/05/19)  Underwent redo ASCENDING AORTA GRAFT, WITH CARDIOPULMONARY BYPASS, WITH AORTIC ROOT REPLACEMENT USING VALVED CONDUIT AND CORONARY RECONSTRUCTION (EG,BENTALL), redo sternotomy s/p Root replacement Feb 2020 with [name] at [facility], 07/21/19.  Is supposed to be on anticogaulation with Warfarin. INR goal of 2-3.  Patient has not been able to keep INR appointments due to unreliable transportation.  Patient was supposed to have appointment with [name] for follow up, CT scan, and follow up echocardiogram in 01/2020, but did not end up keeping the appointment.  Dr. Delane Ginger has ordered Echocardiogram and referred patient to [facility] for future follow up.      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 Summary below:  03/20/18: S/p aortic root replacement with homograft 25mm, reconstruction of aortomitral continuity, repair of right atrial fistula, mitral valve repair, Epicardial pacemaker placed for bradycardia   History of bicuspid aortic valve with mod-severe AI  AV vegetations found on TEE, blood cultures grew S. Pneumoniae  Treated initially with Vancomycin and Ceftriaxone x 2 weeks to cover for possible meningitis as well and discharged to rehab on Ceftriaxone 2g IV daily x 4 weeks.   No chest discomfort, SOB  Dr. Delane Ginger referred to [facility] Cardiology and order for Echo placed. Will try to secure Echo appointment for the same day as PCP appointment.      Nicotine dependence  - chronic, stable  Smoker, 0.5 PPD x 20 years   5-6 cigs/day  Revisit readiness at next appt. Not interested in quitting.      Sexual health & secondary prevention  - chronic, stable  Not in relationship.  LSE recently.  Parts of body used during sex include: mouth and penis. Does not have anal sex. Gives and receives oral sex. Insertive partner for vaginal sex.   Since last visit has had no sex and has not had add'l STI screening.  He sometimes uses condom for vaginal sex  He does routinely discuss HIV status with partner(s).  Have not discussed interest in having children.    Lab Results   Component Value Date    RPR Nonreactive 10/13/2021    RPR Nonreactive 04/29/2020    CTNAA Positive (A) 10/13/2021    CTNAA Negative 10/13/2021    CTNAA Negative 10/13/2021    GCNAA Positive (A) 10/13/2021    GCNAA Negative 10/13/2021    GCNAA Negative 10/13/2021    SPECSOURCE Rectum 10/13/2021    SPECSOURCE Throat 10/13/2021    SPECSOURCE Urine (Male) 10/13/2021     GC/CT NAATs - obtained today from all exposed anatomical site(s)  RPR - for screening obtained today  UPDATE: +GC/CT of rectum, needs treatment. Gwynneth Albright, RN has tried to reach out to inform patient of need for treatment.      Health maintenance  - chronic, stable  PCP: Dr. Glenetta Hew with [facility] Internal Medicine.    Oral health  He does  have a dentist. Last dental exam 2022.    Eye health  He does not use corrective lenses. Last eye exam unknown.    Metabolic conditions  Wt Readings from Last 5 Encounters:   11/10/21 89.4 kg (197 lb)   11/09/21 90.5 kg (199 lb 9.6 oz)   11/03/21 87.5 kg (193 lb)   10/13/21 87.5 kg (193 lb)   10/13/21 (P) 87.7 kg (193 lb 6.4 oz)     Lab Results   Component Value Date    CREATININE 0.99 10/13/2021    GLUCOSEU Negative 08/25/2020    ALBCRERAT  10/13/2021      Comment:      Unable to calculate.    GLU 91 10/13/2021    A1C 4.7 (L) 10/13/2021    ALT 13 10/13/2021    ALT 26 08/25/2020    ALT 50 (H) 04/29/2020     # Kidney health - creatinine and UA today  # Bone health -      - 40-50 YO: OPTIMIZE Ca, VIT D, EXERCISE & CALCULATE FRAX SCORE  # Diabetes assessment - random glucose today  # NAFLD assessment -  suspicion for NAFLD low    Communicable diseases  Lab Results   Component Value Date    QFTTBGOLD Negative 04/29/2020    HEPAIGG Nonreactive 10/13/2021    HEPBSAB Nonreactive 10/13/2021    HEPCAB Nonreactive 10/13/2021     # TB screening - no longer needed; negative IGRA, low risk 04/29/20  # Hepatitis screening -  As noted : 05/12/19 Hep A IgM NR  05/12/19 Hep B C IgM NR - Needs Hep A and Hep B vaccination  # MMR screening - not assessed    Cancer screening  No results found for: PSASCRN, PSA, PAP, FINALDX  # Anorectal - not yet done  # Colorectal - screening not indicated  # Liver - no screening indicated  # Lung - screening not indicated  # Prostate - screening not indicated    Cardiovascular disease  Lab Results   Component Value Date    CHOL 177 10/13/2021    HDL 43 10/13/2021    LDL 88 10/13/2021    NONHDL 134 (H) 10/13/2021    TRIG 228 (H) 10/13/2021     # The ASCVD Risk score (Arnett DK, et al., Jul 19, 2017) failed to calculate.  - is not taking aspirin   - is taking statin (needs to restart)  - BP control good  - current smoker  # AAA screening - no indication for screening    Immunization History   Administered Date(s) Administered    COVID-19 VAC,MRNA,TRIS(12Y UP)(PFIZER)(GRAY CAP) 08/13/2019, 04/29/2020    COVID-19 VACC,MRNA(BOOSTER)OR(6-52YR)MODERNA 08/25/2020    COVID-19 VACC,MRNA,(PFIZER)(PF) 08/13/2019, 11/03/2019, 04/29/2020    COVID-19 VACCINE,MRNA(MODERNA)(PF) 08/25/2020    Covid-19 Vac, (20yr+) (Spikevax) Monovalent Xbb.1.5 Moder  11/03/2021    HEPATITIS B VACCINE ADULT, ADJUVANTED, IM(HEPLISAV B) 10/13/2021    Influenza Vaccine Quad (IIV4 PF) 72mo+ injectable 10/13/2021    Pneumococcal Conjugate 20-valent 08/25/2020     Immunizations today - hepatitis B and influenza  Hep B #2 at appropriate time  Hep A #1  New CoVid bosoter    I personally spent 60 minutes face-to-face and non-face-to-face in the care of this patient, which includes all pre, intra, and post visit time on the date of service.  All documented time was specific to the E/M visit and does not include any procedures that may have been performed.      Disposition  Next appointment: 4 weeks      To do @ next RTC  Discuss anal pap  Hep A#1    Varney Daily, FNP-BC  [facility] at [facility]  [address], [place], [place] [address]    Phone: [phone]   Fax: [phone]             Subjective      Chief Complaint   HIV follow up    HPI  In addition to details in A&P above:  Has more bumps around rectum, would like to see GI again and treatment to bridge him till he can be seen  No interim illnesses.  Has been out of meds (all meds)  Has multiple new ideations which is out of character for the patient. He discussed the following:  Sister passed away in Jul 19, 2021, in [place]. He reports he believes she was killed by their family members for the insurance money ($1 million). He then had a dream in July 2023 where she told him that she was not buried and still in the morgue which upset him quite a bit.  He grew up in foster  care along with his sister. He recently found out that his mother was a prostitute and became pregnant with him from one of her clients. This father was wealthy and owned a company, which the father bequeathed to him (company and a house) over his half siblings. He reports that those siblings cheated him out of this inheritance unbeknownst to him.   The foster family he grew up with reportedly bequeathed their house to him as well and he reports that the same family members cheated him out of this as well, by forming a management company that took over the property.   He believes that all the friends he has made in [place] have turned out to be family members that have conspired to destroy his life and happiness. He believes he is being watched by cameras in his home and that his mobile phone is bugged. Due to feeling watched, he has missed doses of his Biktarvy. During our visit when I mentioned his HIV diagnosis, he refused to speak until we put his mobile phone out of the room so he could not be heard.  He also believes that his HIV AVS has been put on the internet to expose his HIV diagnosis as well as information about his genital wart treatment (surgical report?)        Past Medical History:   Diagnosis Date    Bicuspid aortic valve     mod-severe AI    Endocarditis 02/2018    S. 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 05/26/19 [name] 12g continuous infusion+Rifampin 300mg  Q8H, 06/25/19 Change to Cefazolin 2g Q8H+Rifampin, TTE 5/4 showed Paravalvular echo lucency measuring approximately 2cm wide as previously seen on TEE. It extends 180 degrees along the right border of the homograft. Appears to be dehiscence and rocking of the aortic homograft.    HIV disease (CMS-HCC) 2008    Dx. in [place]. Denies OI. History of nonadherence to ART. Last on Symtuza. Started on Dolutegravir 50mg  BID and Truvada daily.    Myocardial infarction (CMS-HCC)     Feb 2020    Stroke (CMS-HCC)          Social History  Background - Born and raised in [place], [place]. Moved to [place] with friend.    Housing - in apartment  by himself  School / Work & Benefits - on disability    Social History     Tobacco Use    Smoking status: Every Day     Packs/day: 0.25     Years: 28.00     Additional pack years: 0.00     Total pack years: 7.00     Types: Cigarettes    Smokeless tobacco: Never    Tobacco comments:     4 cigarettes per day   Vaping Use    Vaping Use: Every day   Substance Use Topics    Alcohol use: Yes     Alcohol/week: 3.0 standard drinks of alcohol     Types: 3 Cans of beer per week    Drug use: Not Currently     Types: Marijuana, Cocaine     Comment: Rare use         Review of Systems  As per HPI. All others negative.      Medications and Allergies  He has a current medication list which  includes the following prescription(s): atorvastatin, biktarvy, enoxaparin, podofilox, and warfarin.    Allergies: Patient has no known allergies.      Family History  His family history includes Mental illness in his mother; No Known Problems in his brother, father, and sister.          Objective:      There were no vitals taken for this visit.  Wt Readings from Last 3 Encounters:   11/10/21 89.4 kg (197 lb)   11/09/21 90.5 kg (199 lb 9.6 oz)   11/03/21 87.5 kg (193 lb)      10/13/2021   Vitals - 1 value per visit    BP 138/85    BP 138/85    Heart Rate 92    Heart Rate 92    Temp 36.7 ??C (98 ??F)    Temp 36.7 ??C (98 ??F)    Resp 18    Weight (lb) 193 lbs    Weight (lb) 193 lbs 6 oz    Weight (kg) 87.544 kg    Weight (kg) 87.726 kg    Height IN (Length) 72 in    Height IN (Length) 72 in    Height (CM) (Length) 182.9 cm    Height (CM) (Length) 182.9 cm    BMI (Calculated) 26.17 kg/m2    BMI (Calculated) 26.22 kg/m2    BSA (Calculated - sq m) 2.11 m2    BSA (Calculated - sq m) 2.11 m2    Visit Report --    Visit Report --    Visit Report --    Visit Report --          Const Exhibiting paranoid/delusional thinking , noticeable weight loss   Eyes sclerae anicteric, noninjected OU   ENT no thrush, leukoplakia or oral lesions   Lymph no cervical or supraclavicular LAD   CV RRR. No murmurs. No rub or gallop. S1/S2.   Lungs CTAB ant/post, normal work of breathing   GI Soft, no organomegaly. NTND. NABS.   GU deferred   Rectal About 1.5in cluster of genital warts at about 11:30 position of anus   Skin no petechiae, ecchymoses or obvious rashes on clothed exam   MSK no joint tenderness and normal ROM throughout   Neuro CN grossly intact   Psych Tearful  affect. Eye contact good.  Fluent speech.     Laboratory Data  Reviewed in Epic today, using Synopsis and Chart Review filters.    Lab Results   Component Value Date    CREATININE 0.99 10/13/2021    QFTTBGOLD Negative 04/29/2020    HEPCAB Nonreactive 10/13/2021    CHOL 177 10/13/2021    HDL 43 10/13/2021    LDL 88 10/13/2021    NONHDL 134 (H) 10/13/2021    TRIG 228 (H) 10/13/2021    A1C 4.7 (L) 10/13/2021

## 2021-11-15 NOTE — Unmapped (Signed)
[facility] Nursing Note       TYPE OF ENCOUNTER:Phone     REASON: Follow up     ASSESSMENT: Pt. Was scheduled for an appt with provider, on 10/23 but was a no show. Pt. Was seen for an Echo cardiogram on the same day at [facility]. Pt. Asked about who would review the results of the echo with him. RN provided the patient with the number to the Cardiology clinic and Internal Medicine Clinic.     ADHERENCE: Pt. States that he is taking his medication without any issues.     INTERVENTION: Pt.'s next visit was scheduled for 12/1 with Varney Daily. Provider is aware and approves follow up date.     PLAN: RN will continue to contact patient weekly.    CONTACT DURATION: 5 min

## 2021-12-08 NOTE — Unmapped (Signed)
[facility] Nursing Note    TYPE OF ENCOUNTER: Phone     REASON: Follow up     ASSESSMENT: RN contacted patient to make sure he was well and had everything he needed.    ADHERENCE: Pt. States that he is taking his medicine without any issues.     INTERVENTION: Pt. Was reminded that he has appointments with both ID and IM providers on 12/1.     PLAN:  RN will continue to follow up     CONTACT DURATION: 5 min

## 2021-12-18 ENCOUNTER — Ambulatory Visit: Admit: 2021-12-18 | Payer: MEDICARE

## 2021-12-22 ENCOUNTER — Ambulatory Visit: Admit: 2021-12-22 | Payer: MEDICARE

## 2021-12-22 ENCOUNTER — Ambulatory Visit: Admit: 2021-12-22 | Payer: MEDICARE | Attending: Family | Primary: Family

## 2021-12-22 NOTE — Unmapped (Unsigned)
[facility]  [address]  [place], [place]  [address]  P [phone]  F [phone]     Primary care provider: Bobbie Stack, MD    Assessment/Plan:      HIV (dx'd 08-02-2006)  - chronic, stable   According to notes, patient was diagnosed in 2006-08-02 after routine STI testing.   Denies prior opportunistic infections.  Has been non adherent with ART in the past. Last took [place].  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  Patient had been out of care since moving to [place] from [place] in 08/02/18.  In Medical Case Management with IExcell Seltzer, RN in light of complex medical history  Has not been coming to appointments due to limitations in transportation.    Overall doing well. Current regimen: Biktarvy (BIC/FTC/TAF)  Misses doses of ARVs rarely    Med access via Medicaid  CD4 count  200's-300's  Discussed ARV adherence and taking ARVs with food    Lab Results   Component Value Date    ACD4 416 (L) 10/13/2021    CD4 26 (L) 10/13/2021    HIVRS Detected (A) 10/13/2021    HIVCP 34 (H) 10/13/2021     CD4, HIV RNA, and safety labs (full return panel)  Continue current therapy. On Biktarvy, has not been taking Bitkarvy consistently due to being paranoid that he is being watched. Sent script to [facility] to get 30 day supply today. Francisca December, RN has picked up meds from pharmacy and handed it to patient during his IM appointment.  Encouraged continued excellent ARV adherence  Now has better insurance. Would like to be on Cabenuva. Stressed importance of patient attending ID appointments consistently since missed doses of Cabenuva can certainly lead to resistance. He verbalized understanding of this and reports he can now attend his appointments consistently now that he has a new transportation provider.  Added toxo IgG on 10/16/21.      Genital warts  - chronic, stable  Diagnosed in [place]. Evaluated with previous PCP in [place], first treated with cryotherapy and then Condylox without relief.   Seen by Dr. Neysa Hotter who agreed with need for surgical intervention and biopsy. Seen by pre-op clinical for evaluation in 09/2019.  Will ask I. Baker to help facilitate another appointment with Dr. Neysa Hotter to again address patient's genital warts.  On exam, noted cluster of genital warts to about 11:30 position of anus. No other small genital warts noted.  Patient previously seen by Dr. Neysa Hotter in 09/2019 and scheduled for surgical intervention but patient unable to follow up due to inconsistent transportation. He now gets transportation through Medicare which is more reliable and would like genital warts addressed.  Patient requested Condylox to bridge him to the appointment with Dr. Neysa Hotter and reviewed instructions on it's use. He verbalized understanding and will follow directions as noted.      Delusional/paranoid thinking - new  Patient came to [facility] from [place] in mid 2018-08-02. Started having complex medical issues that lead to major heart surgery  Had girlfriend he somewhat depended on during this time but now that relationship has ended under bad circumstances.  He has moved into another apartment but had no transportation besides Medicaid transport which was unreliable but now using Medicare transportation.  See HPI below for details and Dr. Shiela Mayer note from today (10/13/21).  Seeming new onset of paranoia and delusions. Recent death of his sister in 08-01-21. Recent marijuana and cocaine use. Noticeable weight  loss.  Patient often tearful during the visit.  Denies SI or HI. He is pre-occupied with being watched from all aspects of his life by friends/family who he believes has conspired to ruin his life. He believes there's cameras in his home, and his cell phone is bugged. Reports recent history of smoking marijuana and snorted cocaine a few days ago.  SW team and Gwynneth Albright, RN are aware of this. Due to paranoid thoughts, SW team recommended limiting the number of caregivers the patient comes in contact with through today's visit.  He has appointment with Internal Medicine after this appointment and warm hand-off given to Dr. Delane Ginger who was seeing him today.  See her note for further plan and close follow up.   Current symptoms are out of character for the patient.   Discussed case with Dr. Cardell Peach who has low suspicion for HIV-related etiology of mental status changes. However, in setting of seeming acute symptoms and weight loss, should rule out MRI brain with/without contrast to rule out CNS Lymphoma.  Order placed. Able to secure appointment for MRI on 10/30/21 at 7:45am at spine center, same day as PCP appointment.      Hyperlipidemia - chronic, progressive  Patient had been taking lipitor but had run out.  Refilled today by PCP  Obtained lipid panel and A1C today per Dr. 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 TEE on 4/21 showed a 2x2 cm vegetation on the non-coronary cusp on the aortic valve with mild-mod AI.  Treated with Vancomycin and Cefepime, then narrowed to Cefazolin.  Per discharge summary on 06/02/19:   CTA on 05/18/19 showed a contained rupture or pseudoaneurysm of the aortic outflow tract measuring 4.2 x 3.0 cm proximal to the anterior leaflet of the aortic valve with possible aortic root thrombus. Previous TEE from OSH 4/27 showed EF  60-65% with severe AI. Pacemaker evaluation showed Medtronic Azure Dual Chamber Pacemaker, EKG & telemetry demonstrating a-sensed, v-paced rhythm. Repeat TTE 5/4 showed Paravalvular echo lucency measuring approximately 2cm wide as previously seen on TEE. It extends 180 degrees along the right border of the homograft. There appears to be dehiscence and rocking of the aortic homograft. His status is tenuous with extremely high risk of decompensation & death should the graft fail.   Transferred to [facility] on 05/26/19, antibiotics changed to [name] 2 g q 4h x 6 weeks (05/26/19-07/05/19), Gentamicin x 2 weeks (05/26/19-06/13/19, completed) and Rifampin 300mg  q8h x 6 weeks (05/26/19-07/05/19)  Underwent redo ASCENDING AORTA GRAFT, WITH CARDIOPULMONARY BYPASS, WITH AORTIC ROOT REPLACEMENT USING VALVED CONDUIT AND CORONARY RECONSTRUCTION (EG,BENTALL), redo sternotomy s/p Root replacement Feb 2020 with [name] at [facility], 07/21/19.  Is supposed to be on anticogaulation with Warfarin. INR goal of 2-3.  Patient has not been able to keep INR appointments due to unreliable transportation.  Patient was supposed to have appointment with [name] for follow up, CT scan, and follow up echocardiogram in 01/2020, but did not end up keeping the appointment.  Dr. Delane Ginger has ordered Echocardiogram and referred patient to [name] for future follow up.      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 Summary below:  03/20/18: S/p aortic root replacement with homograft 25mm, reconstruction of aortomitral continuity, repair of right atrial fistula, mitral valve repair, Epicardial pacemaker placed for bradycardia   History of bicuspid aortic valve with mod-severe AI  AV vegetations found on TEE, blood cultures grew S. Pneumoniae  Treated initially with Vancomycin and Ceftriaxone x 2 weeks to cover for possible meningitis as well and discharged to rehab on Ceftriaxone 2g IV daily x 4 weeks.   No chest discomfort, SOB  Dr. Delane Ginger referred to [facility] and order for Echo placed. Will try to secure Echo appointment for the same day as PCP appointment.      Nicotine dependence  - chronic, stable  Smoker, 0.5 PPD x 20 years   5-6 cigs/day  Revisit readiness at next appt. Not interested in quitting.      Sexual health & secondary prevention  - chronic, stable  Not in relationship.  LSE recently.  Parts of body used during sex include: mouth and penis. Does not have anal sex. Gives and receives oral sex. Insertive partner for vaginal sex.   Since last visit has had no sex and has not had add'l STI screening.  He sometimes uses condom for vaginal sex  He does routinely discuss HIV status with partner(s).  Have not discussed interest in having children.    Lab Results   Component Value Date    RPR Nonreactive 10/13/2021    RPR Nonreactive 04/29/2020    CTNAA Positive (A) 10/13/2021    CTNAA Negative 10/13/2021    CTNAA Negative 10/13/2021    GCNAA Positive (A) 10/13/2021    GCNAA Negative 10/13/2021    GCNAA Negative 10/13/2021    SPECSOURCE Rectum 10/13/2021    SPECSOURCE Throat 10/13/2021    SPECSOURCE Urine (Male) 10/13/2021     GC/CT NAATs - obtained today from all exposed anatomical site(s)  RPR - for screening obtained today  UPDATE: +GC/CT of rectum, needs treatment. Gwynneth Albright, RN has tried to reach out to inform patient of need for treatment.      Health maintenance  - chronic, stable  PCP: Dr. Glenetta Hew with [facility].    Oral health  He does  have a dentist. Last dental exam 2022.    Eye health  He does not use corrective lenses. Last eye exam unknown.    Metabolic conditions  Wt Readings from Last 5 Encounters:   11/10/21 89.4 kg (197 lb)   11/09/21 90.5 kg (199 lb 9.6 oz)   11/03/21 87.5 kg (193 lb)   10/13/21 87.5 kg (193 lb)   10/13/21 (P) 87.7 kg (193 lb 6.4 oz)     Lab Results   Component Value Date    CREATININE 0.99 10/13/2021    GLUCOSEU Negative 08/25/2020    ALBCRERAT  10/13/2021      Comment:      Unable to calculate.    GLU 91 10/13/2021    A1C 4.7 (L) 10/13/2021    ALT 13 10/13/2021    ALT 26 08/25/2020    ALT 50 (H) 04/29/2020     # Kidney health - creatinine and UA today  # Bone health -      - 40-50 YO: OPTIMIZE Ca, VIT D, EXERCISE & CALCULATE FRAX SCORE  # Diabetes assessment - random glucose today  # NAFLD assessment -  suspicion for NAFLD low    Communicable diseases  Lab Results   Component Value Date    QFTTBGOLD Negative 04/29/2020    HEPAIGG Nonreactive 10/13/2021    HEPBSAB Nonreactive 10/13/2021    HEPCAB Nonreactive 10/13/2021     # TB screening - no longer needed; negative IGRA, low risk 04/29/20  # Hepatitis screening -  As noted : 05/12/19 Hep A IgM NR  05/12/19 Hep B C IgM NR - Needs Hep A and Hep B vaccination  # MMR screening - not assessed    Cancer screening  No results found for: PSASCRN, PSA, PAP, FINALDX  # Anorectal - not yet done  # Colorectal - screening not indicated  # Liver - no screening indicated  # Lung - screening not indicated  # Prostate - screening not indicated    Cardiovascular disease  Lab Results   Component Value Date    CHOL 177 10/13/2021    HDL 43 10/13/2021    LDL 88 10/13/2021    NONHDL 134 (H) 10/13/2021    TRIG 228 (H) 10/13/2021     # The ASCVD Risk score (Arnett DK, et al., 08-03-2017) failed to calculate.  - is not taking aspirin   - is taking statin (needs to restart)  - BP control good  - current smoker  # AAA screening - no indication for screening    Immunization History   Administered Date(s) Administered    COVID-19 VAC,MRNA,TRIS(12Y UP)(PFIZER)(GRAY CAP) 08/13/2019, 04/29/2020    COVID-19 VACC,MRNA(BOOSTER)OR(6-101YR)MODERNA 08/25/2020    COVID-19 VACC,MRNA,(PFIZER)(PF) 08/13/2019, 11/03/2019, 04/29/2020    COVID-19 VACCINE,MRNA(MODERNA)(PF) 08/25/2020    Covid-19 Vac, (35yr+) (Spikevax) Monovalent Xbb.1.5 Moder  11/03/2021    HEPATITIS B VACCINE ADULT, ADJUVANTED, IM(HEPLISAV B) 10/13/2021    Influenza Vaccine Quad (IIV4 PF) 80mo+ injectable 10/13/2021    Pneumococcal Conjugate 20-valent 08/25/2020     Immunizations today - hepatitis B and influenza  Hep B #2 at appropriate time  Hep A #1  New CoVid bosoter    I personally spent 60 minutes face-to-face and non-face-to-face in the care of this patient, which includes all pre, intra, and post visit time on the date of service.  All documented time was specific to the E/M visit and does not include any procedures that may have been performed.      Disposition  Next appointment: 4 weeks      To do @ next RTC  Discuss anal pap  Hep A#1    Varney Daily, FNP-BC  [facility] at [facility]  [address], [place], [place] [address]    Phone: [phone]   Fax: [phone]             Subjective      Chief Complaint   HIV follow up    HPI  In addition to details in A&P above:  Has more bumps around rectum, would like to see GI again and treatment to bridge him till he can be seen  No interim illnesses.  Has been out of meds (all meds)  Has multiple new ideations which is out of character for the patient. He discussed the following:  Sister passed away in 08/03/21, in [place]. He reports he believes she was killed by their family members for the insurance money ($1 million). He then had a dream in July 2023 where she told him that she was not buried and still in the morgue which upset him quite a bit.  He grew up in foster  care along with his sister. He recently found out that his mother was a prostitute and became pregnant with him from one of her clients. This father was wealthy and owned a company, which the father bequeathed to him (company and a house) over his half siblings. He reports that those siblings cheated him out of this inheritance unbeknownst to him.   The foster family he grew up with reportedly bequeathed their house to him as well and he reports that the same family members cheated him out of this as well, by forming a management company that took over the property.   He believes that all the friends he has made in [place] have turned out to be family members that have conspired to destroy his life and happiness. He believes he is being watched by cameras in his home and that his mobile phone is bugged. Due to feeling watched, he has missed doses of his Biktarvy. During our visit when I mentioned his HIV diagnosis, he refused to speak until we put his mobile phone out of the room so he could not be heard.  He also believes that his HIV AVS has been put on the internet to expose his HIV diagnosis as well as information about his genital wart treatment (surgical report?)        Past Medical History:   Diagnosis Date    Bicuspid aortic valve     mod-severe AI    Endocarditis 02/2018    S. 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 05/26/19 [name] 12g continuous infusion+Rifampin 300mg  Q8H, 06/25/19 Change to Cefazolin 2g Q8H+Rifampin, TTE 5/4 showed Paravalvular echo lucency measuring approximately 2cm wide as previously seen on TEE. It extends 180 degrees along the right border of the homograft. Appears to be dehiscence and rocking of the aortic homograft.    HIV disease (CMS-HCC) 2008    Dx. in [place]. Denies OI. History of nonadherence to ART. Last on Symtuza. Started on Dolutegravir 50mg  BID and Truvada daily.    Myocardial infarction (CMS-HCC)     Feb 2020    Stroke (CMS-HCC)          Social History  Background - Born and raised in [place], [place]. Moved to [place] with friend.    Housing - in apartment  by himself  School / Work & Benefits - on disability    Social History     Tobacco Use    Smoking status: Every Day     Packs/day: 0.25     Years: 28.00     Additional pack years: 0.00     Total pack years: 7.00     Types: Cigarettes    Smokeless tobacco: Never    Tobacco comments:     4 cigarettes per day   Vaping Use    Vaping Use: Every day   Substance Use Topics    Alcohol use: Yes     Alcohol/week: 3.0 standard drinks of alcohol     Types: 3 Cans of beer per week    Drug use: Not Currently     Types: Marijuana, Cocaine     Comment: Rare use         Review of Systems  As per HPI. All others negative.      Medications and Allergies  He has a current medication list which  includes the following prescription(s): atorvastatin, biktarvy, enoxaparin, podofilox, and warfarin.    Allergies: Patient has no known allergies.      Family History  His family history includes Mental illness in his mother; No Known Problems in his brother, father, and sister.          Objective:      There were no vitals taken for this visit.  Wt Readings from Last 3 Encounters:   11/10/21 89.4 kg (197 lb)   11/09/21 90.5 kg (199 lb 9.6 oz)   11/03/21 87.5 kg (193 lb)      10/13/2021   Vitals - 1 value per visit    BP 138/85    BP 138/85    Heart Rate 92    Heart Rate 92    Temp 36.7 ??C (98 ??F)    Temp 36.7 ??C (98 ??F)    Resp 18    Weight (lb) 193 lbs    Weight (lb) 193 lbs 6 oz    Weight (kg) 87.544 kg    Weight (kg) 87.726 kg    Height IN (Length) 72 in    Height IN (Length) 72 in    Height (CM) (Length) 182.9 cm    Height (CM) (Length) 182.9 cm    BMI (Calculated) 26.17 kg/m2    BMI (Calculated) 26.22 kg/m2    BSA (Calculated - sq m) 2.11 m2    BSA (Calculated - sq m) 2.11 m2    Visit Report --    Visit Report --    Visit Report --    Visit Report --          Const Exhibiting paranoid/delusional thinking , noticeable weight loss   Eyes sclerae anicteric, noninjected OU   ENT no thrush, leukoplakia or oral lesions   Lymph no cervical or supraclavicular LAD   CV RRR. No murmurs. No rub or gallop. S1/S2.   Lungs CTAB ant/post, normal work of breathing   GI Soft, no organomegaly. NTND. NABS.   GU deferred   Rectal About 1.5in cluster of genital warts at about 11:30 position of anus   Skin no petechiae, ecchymoses or obvious rashes on clothed exam   MSK no joint tenderness and normal ROM throughout   Neuro CN grossly intact   Psych Tearful  affect. Eye contact good.  Fluent speech.     Laboratory Data  Reviewed in Epic today, using Synopsis and Chart Review filters.    Lab Results   Component Value Date    CREATININE 0.99 10/13/2021    QFTTBGOLD Negative 04/29/2020    HEPCAB Nonreactive 10/13/2021    CHOL 177 10/13/2021    HDL 43 10/13/2021    LDL 88 10/13/2021    NONHDL 134 (H) 10/13/2021    TRIG 228 (H) 10/13/2021    A1C 4.7 (L) 10/13/2021

## 2021-12-22 NOTE — Unmapped (Signed)
[facility] Clinical Assessment & Refill Coordination Note    Warren Kress., DOB: 03-29-1975  Phone: [phone] (home)     All above HIPAA information was verified with patient.     Was a Nurse, learning disability used for this call? No    Specialty Medication(s):   Infectious Disease: Biktarvy     Current Outpatient Medications   Medication Sig Dispense Refill    atorvastatin (LIPITOR) 40 MG tablet Take 1 tablet (40 mg total) by mouth daily. 90 tablet 3    bictegrav-emtricit-tenofov ala (BIKTARVY) 50-200-25 mg tablet Take 1 tablet by mouth daily. 30 tablet 11    enoxaparin (LOVENOX) 30 mg/0.3 mL Syrg Inject 3 syringes (90 mg total) under the skin Two (2) times a day. 54 mL 0    podofilox (CONDYLOX) 0.5 % gel Apply topically Two (2) times a day. Use for 3 days, hold for 4 days. Repeat as needed up to 4 times. (Patient not taking: Reported on 11/09/2021) 3.5 g 0    warfarin (JANTOVEN) 5 MG tablet Take 1 tablet (5 mg total) by mouth daily. 30 tablet 11     No current facility-administered medications for this visit.        Changes to medications: Ritchard reports no changes at this time.    No Known Allergies    Changes to allergies: No    SPECIALTY MEDICATION ADHERENCE     Biktarvy 50-200-25 mg: approximately 7 days of medicine on hand       Medication Adherence    Patient reported X missed doses in the last month: 2  Specialty Medication: Biktarvy 50-200-25mg : he said he may have missed a couple  Patient is on additional specialty medications: No  Any gaps in refill history greater than 2 weeks in the last 3 months: yes  Demonstrates understanding of importance of adherence: no  Informant: patient  Provider-estimated medication adherence level: variable  Patient is at risk for Non-Adherence: Yes   Other non-adherence reason: Not calling the [facility] back when called multiple times   Confirmed plan for next specialty medication refill: delivery by pharmacy       Specialty medication(s) dose(s) confirmed: Regimen is correct and unchanged.     Are there any concerns with adherence? Yes: His last fill was in September.  He reports only missing a couple of times    Adherence counseling provided?  I strongly encouraged him to call us back each time we call and that missing doses can lead to the development of resistant HIV strains and it can then take multiple medications to control the virus.    CLINICAL MANAGEMENT AND INTERVENTION      Clinical Benefit Assessment:    Do you feel the medicine is effective or helping your condition? Yes    HIV ASSOCIATED LABS:     Lab Results   Component Value Date/Time    HIVRS Detected (A) 10/13/2021 04:08 PM    HIVRS Not Detected 08/25/2020 10:18 AM    HIVRS Detected (A) 04/29/2020 04:23 PM    HIVCP 34 (H) 10/13/2021 04:08 PM    HIVCP 654 (H) 04/29/2020 04:23 PM    HIVCP <40 (H) 05/26/2019 01:46 PM    ACD4 416 (L) 10/13/2021 04:08 PM    ACD4 480 (L) 08/25/2020 10:18 AM    ACD4 345 (L) 04/29/2020 04:23 PM       Clinical Benefit counseling provided? Not needed    Adverse Effects Assessment:    Are you  experiencing any side effects? No    Are you experiencing difficulty administering your medicine? No    Quality of Life Assessment:      How many days over the past month did your HIV  keep you from your normal activities? For example, brushing your teeth or getting up in the morning. 0    Have you discussed this with your provider? Not needed    Acute Infection Status:    Acute infections noted within Epic:  No active infections  Patient reported infection: None    Therapy Appropriateness:    Is therapy appropriate and patient progressing towards therapeutic goals? Yes, therapy is appropriate and should be continued    DISEASE/MEDICATION-SPECIFIC INFORMATION      N/A    HIV: Not Applicable    PATIENT SPECIFIC NEEDS     Does the patient have any physical, cognitive, or cultural barriers? No    Is the patient high risk? No    Did the patient require a clinical intervention? No    Does the patient require physician intervention or other additional services (i.e., nutrition, smoking cessation, social work)? No    SOCIAL DETERMINANTS OF HEALTH     At the [facility], we have learned that life circumstances - like trouble affording food, housing, utilities, or transportation can affect the health of many of our patients.   That is why we wanted to ask: are you currently experiencing any life circumstances that are negatively impacting your health and/or quality of life? Patient declined to answer    Social Determinants of Health     Financial Resource Strain: Low Risk  (05/25/2019)    Overall Financial Resource Strain (CARDIA)     Difficulty of Paying Living Expenses: Not very hard   Internet Connectivity: Not on file   Food Insecurity: Food Insecurity Present (10/13/2021)    Hunger Vital Sign     Worried About Running Out of Food in the Last Year: Sometimes true     Ran Out of Food in the Last Year: Sometimes true   Tobacco Use: High Risk (11/09/2021)    Patient History     Smoking Tobacco Use: Every Day     Smokeless Tobacco Use: Never     Passive Exposure: Not on file   Housing/Utilities: Low Risk  (05/25/2019)    Housing/Utilities     Within the past 12 months, have you ever stayed: outside, in a car, in a tent, in an overnight shelter, or temporarily in someone else's home (i.e. couch-surfing)?: No     Are you worried about losing your housing?: No     Within the past 12 months, have you been unable to get utilities (heat, electricity) when it was really needed?: No   Alcohol Use: Not on file   Transportation Needs: No Transportation Needs (05/25/2019)    PRAPARE - Therapist, art (Medical): No     Lack of Transportation (Non-Medical): No   Substance Use: Not on file   Health Literacy: Not on file   Physical Activity: Not on file   Interpersonal Safety: Not on file   Stress: Not on file   Intimate Partner Violence: Not on file   Depression: Not on file   Social Connections: Not on file       Would you be willing to receive help with any of the needs that you have identified today? Not applicable       SHIPPING  Specialty Medication(s) to be Shipped:   Infectious Disease: Biktarvy    Other medication(s) to be shipped:  warfarin 5mg      Changes to insurance: No    Delivery Scheduled: Yes, Expected medication delivery date: 12/26/21.     Medication will be delivered via UPS to the confirmed prescription address in [facility].    The patient will receive a drug information handout for each medication shipped and additional FDA Medication Guides as required.  Verified that patient has previously received a Conservation officer, historic buildings and a Surveyor, mining.    The patient or caregiver noted above participated in the development of this care plan and knows that they can request review of or adjustments to the care plan at any time.      All of the patient's questions and concerns have been addressed.    [name], PharmD   [facility] Specialty Pharmacist

## 2021-12-22 NOTE — Unmapped (Signed)
[facility] Nursing Attempt       TYPE OF ENCOUNTER: Phone    REASON: Appointment     ASSESSMENT: Pt. Has an appointment on 12.1 with Internal Medicine and the ID clinic at 1:30.  RN attempted to call patient and remind him of his appt, but there was no answer. RN sent a text remindinig him of his appt.       PLAN: RN will continue to follow up     CONTACT DURATION: 1 min

## 2021-12-25 MED FILL — WARFARIN 5 MG TABLET: ORAL | 30 days supply | Qty: 30 | Fill #1

## 2022-01-02 NOTE — Unmapped (Signed)
[facility] Nursing Attempt       TYPE OF ENCOUNTER: Phone     REASON: Follow up     ASSESSMENT: RN attempted to reach out to patient. Pt. Has no showed both appts with ID and IM.   Phone message states,  The person you are trying to reach is not taking calls at this time. Please try again later.    Plan: RN will continue to try and follow up    CONTACT DURATION: 1 min

## 2022-01-09 NOTE — Unmapped (Signed)
Left vm for patient to contact Benefits to renew RW application.    Warren [name]  Benefits Counselor  Time of Intervention: 2 mins

## 2022-01-16 NOTE — Unmapped (Signed)
[facility] Nursing Note        TYPE OF ENCOUNTER: Phone     REASON: Follow Up and Appointment     ASSESSMENT: RN contacted patient to check in and find out how things were going. RN has been unable to reach patient via phone, and he did not come to his last appt on 12/1.    ADHERENCE: RN asked the patient how he was doing with taking his medicine and he states  Ummm, Im working on things. Everything is ok. RN reiterated that it was ok to be honest about  whether or not he was taking his meds, and no judgements would be made around this. But patient still states,  yup, everything is ok.       INTERVENTION:  Pt. Was scheduled for an appt on 02/16/22. RN tried to get the patient in sooner, but he states,  I need sometime to get myself together. RN explained that things don't  have to be together in order for [place] to see him in clinic, but the patient states that he would still feel more comfortable waiting until the 26th. During the conversation, RN asked the patient if he felt safe in his space, and after a long pause he said,  Yes, everything is fine.    PLAN: RN contacted the Internal Medicine clinic to coordinate clinic visits  on the same day. Dr. Glenetta Hew did not have availability on the 26th, but did have an opening on the 25th. Pt. Was scheduled with Dr. Glenetta Hew on 1/25 at 2:25, and with the ID clinic on 1/25 @ 3 pm. RN tried to call the patient back with this information, but there was no answer. RN sent the following text:    RN: Warren [name], Its Warren [name] . I called Dr. Manuela Schwartz office and they did not have anything for 1/26, but they did have availability for 1/25. So I scheduled you an appt with Dr. Glenetta Hew on 1.25 @ 2:25 and with [facility] on the same day, but at 3 pm  RN: I hope that is ok. So both appointments are for Jan 25th- one at 2:25 and 3:00.  RN: Also, our benefits counselor called you to renew your [employer]. Remember that gives you access to certain services like dental, nutrition, mental health, lyft rides, and the ability to continue to work with me ??. When you can please call her at [phone]. Her name is Lissa Hoard    CONTACT DURATION: 25 min

## 2022-01-16 NOTE — Unmapped (Addendum)
Attempted to contact patient to renew [employer]. Patient answered, but they immediately hung up once benefits coordinator introduced themselves.      Used benefits note from 11/09/2020 to assess:    Housing Status  Stable/Permanent    Ran RTE Geologist, engineering) for active insurance plans and found:    Insurance  Medicare Part C and Medicaid    Updated CW.      Mickle Asper,  Benefits & Eligibility Coordinator  Time of Intervention: 5 minutes

## 2022-01-26 NOTE — Unmapped (Signed)
[facility] Refill Coordination Note    Specialty Medication(s) to be Shipped:   Infectious Disease: Biktarvy    Other medication(s) to be shipped: No additional medications requested for fill at this time     Warren Matczak., DOB: April 13, 1975  Phone: [phone] (home)       All above HIPAA information was verified with patient.     Was a Nurse, learning disability used for this call? No    Completed refill call assessment today to schedule patient's medication shipment from the [facility] [phone]).  All relevant notes have been reviewed.     Specialty medication(s) and dose(s) confirmed: Regimen is correct and unchanged.   Changes to medications: Warren [name] reports no changes at this time.  Changes to insurance: No  New side effects reported not previously addressed with a pharmacist or physician: None reported  Questions for the pharmacist: No    Confirmed patient received a Conservation officer, historic buildings and a Surveyor, mining with first shipment. The patient will receive a drug information handout for each medication shipped and additional FDA Medication Guides as required.       DISEASE/MEDICATION-SPECIFIC INFORMATION        N/A    SPECIALTY MEDICATION ADHERENCE     Medication Adherence    Patient reported X missed doses in the last month: 0  Specialty Medication: Biktarvy 50-200-25mg   Patient is on additional specialty medications: No  Patient is on more than two specialty medications: No  Any gaps in refill history greater than 2 weeks in the last 3 months: no  Demonstrates understanding of importance of adherence: yes                          Were doses missed due to medication being on hold? No    biktarvy 50-200-25mg   : 3 days of medicine on hand       REFERRAL TO PHARMACIST     Referral to the pharmacist: Not needed      [place]     Shipping address confirmed in Epic.     Delivery Scheduled: Yes, Expected medication delivery date: 1/08.     Medication will be delivered via UPS to the prescription address in Epic W[facility].    Warren [name]   [facility] Specialty Technician

## 2022-01-28 MED FILL — BIKTARVY 50 MG-200 MG-25 MG TABLET: ORAL | 30 days supply | Qty: 30 | Fill #1

## 2022-02-15 ENCOUNTER — Ambulatory Visit: Admit: 2022-02-15 | Payer: MEDICARE

## 2022-02-15 ENCOUNTER — Ambulatory Visit: Admit: 2022-02-15 | Payer: MEDICARE | Attending: Family | Primary: Family

## 2022-02-15 NOTE — Unmapped (Unsigned)
[facility]  [address]  [place], [place]  [address]  P [phone]  F [phone]     Primary care provider: Bobbie Stack, MD    Assessment/Plan:      HIV (dx'd 07/15/2006)  - chronic, stable   According to notes, patient was diagnosed in 2006/07/15 after routine STI testing.   Denies prior opportunistic infections.  Has been non adherent with ART in the past. Last took [place].  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  Patient had been out of care since moving to [place] from [place] in 07/15/2018.  In Medical Case Management with IExcell Seltzer, RN in light of complex medical history  Has not been coming to appointments due to limitations in transportation.    Overall doing well. Current regimen: Biktarvy (BIC/FTC/TAF)  Misses doses of ARVs rarely    Med access via Medicaid  CD4 count  200's-300's  Discussed ARV adherence and taking ARVs with food    Lab Results   Component Value Date    ACD4 416 (L) 10/13/2021    CD4 26 (L) 10/13/2021    HIVRS Detected (A) 10/13/2021    HIVCP 34 (H) 10/13/2021     CD4, HIV RNA, and safety labs (full return panel)  Continue current therapy. On Biktarvy, has not been taking Bitkarvy consistently due to being paranoid that he is being watched. Sent script to [facility] to get 30 day supply today. Francisca December, RN has picked up meds from pharmacy and handed it to patient during his IM appointment.  Encouraged continued excellent ARV adherence  Now has better insurance. Would like to be on Cabenuva. Stressed importance of patient attending ID appointments consistently since missed doses of Cabenuva can certainly lead to resistance. He verbalized understanding of this and reports he can now attend his appointments consistently now that he has a new transportation provider.  Added toxo IgG on 10/16/21.      Genital warts  - chronic, stable  Diagnosed in [place]. Evaluated with previous PCP in [place], first treated with cryotherapy and then Condylox without relief.   Seen by Dr. Neysa Hotter who agreed with need for surgical intervention and biopsy. Seen by pre-op clinical for evaluation in 09/2019.  Will ask I. Baker to help facilitate another appointment with Dr. Neysa Hotter to again address patient's genital warts.  On exam, noted cluster of genital warts to about 11:30 position of anus. No other small genital warts noted.  Patient previously seen by Dr. Neysa Hotter in 09/2019 and scheduled for surgical intervention but patient unable to follow up due to inconsistent transportation. He now gets transportation through Medicare which is more reliable and would like genital warts addressed.  Patient requested Condylox to bridge him to the appointment with Dr. Neysa Hotter and reviewed instructions on it's use. He verbalized understanding and will follow directions as noted.      Delusional/paranoid thinking - new  Patient came to [facility] from [place] in mid 07-15-2018. Started having complex medical issues that lead to major heart surgery  Had girlfriend he somewhat depended on during this time but now that relationship has ended under bad circumstances.  He has moved into another apartment but had no transportation besides Medicaid transport which was unreliable but now using Medicare transportation.  See HPI below for details and Dr. Shiela Mayer note from today (10/13/21).  Seeming new onset of paranoia and delusions. Recent death of his sister in 07-14-21. Recent marijuana and cocaine use. Noticeable weight  loss.  Patient often tearful during the visit.  Denies SI or HI. He is pre-occupied with being watched from all aspects of his life by friends/family who he believes has conspired to ruin his life. He believes there's cameras in his home, and his cell phone is bugged. Reports recent history of smoking marijuana and snorted cocaine a few days ago.  SW team and Gwynneth Albright, RN are aware of this. Due to paranoid thoughts, SW team recommended limiting the number of caregivers the patient comes in contact with through today's visit.  He has appointment with Internal Medicine after this appointment and warm hand-off given to Dr. Delane Ginger who was seeing him today.  See her note for further plan and close follow up.   Current symptoms are out of character for the patient.   Discussed case with Dr. Cardell Peach who has low suspicion for HIV-related etiology of mental status changes. However, in setting of seeming acute symptoms and weight loss, should rule out MRI brain with/without contrast to rule out CNS Lymphoma.  Order placed. Able to secure appointment for MRI on 10/30/21 at 7:45am at spine center, same day as PCP appointment.      Hyperlipidemia - chronic, progressive  Patient had been taking lipitor but had run out.  Refilled today by PCP  Obtained lipid panel and A1C today per Dr. 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 TEE on 4/21 showed a 2x2 cm vegetation on the non-coronary cusp on the aortic valve with mild-mod AI.  Treated with Vancomycin and Cefepime, then narrowed to Cefazolin.  Per discharge summary on 06/02/19:   CTA on 05/18/19 showed a contained rupture or pseudoaneurysm of the aortic outflow tract measuring 4.2 x 3.0 cm proximal to the anterior leaflet of the aortic valve with possible aortic root thrombus. Previous TEE from OSH 4/27 showed EF  60-65% with severe AI. Pacemaker evaluation showed Medtronic Azure Dual Chamber Pacemaker, EKG & telemetry demonstrating a-sensed, v-paced rhythm. Repeat TTE 5/4 showed Paravalvular echo lucency measuring approximately 2cm wide as previously seen on TEE. It extends 180 degrees along the right border of the homograft. There appears to be dehiscence and rocking of the aortic homograft. His status is tenuous with extremely high risk of decompensation & death should the graft fail.   Transferred to [facility] on 05/26/19, antibiotics changed to [name] 2 g q 4h x 6 weeks (05/26/19-07/05/19), Gentamicin x 2 weeks (05/26/19-06/13/19, completed) and Rifampin 300mg  q8h x 6 weeks (05/26/19-07/05/19)  Underwent redo ASCENDING AORTA GRAFT, WITH CARDIOPULMONARY BYPASS, WITH AORTIC ROOT REPLACEMENT USING VALVED CONDUIT AND CORONARY RECONSTRUCTION (EG,BENTALL), redo sternotomy s/p Root replacement Feb 2020 with [name] at [facility], 07/21/19.  Is supposed to be on anticogaulation with Warfarin. INR goal of 2-3.  Patient has not been able to keep INR appointments due to unreliable transportation.  Patient was supposed to have appointment with [name] for follow up, CT scan, and follow up echocardiogram in 01/2020, but did not end up keeping the appointment.  Dr. Delane Ginger has ordered Echocardiogram and referred patient to [facility] for future follow up.      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 Summary below:  03/20/18: S/p aortic root replacement with homograft 25mm, reconstruction of aortomitral continuity, repair of right atrial fistula, mitral valve repair, Epicardial pacemaker placed for bradycardia   History of bicuspid aortic valve with mod-severe AI  AV vegetations found on TEE, blood cultures grew S. Pneumoniae  Treated initially with Vancomycin and Ceftriaxone x 2 weeks to cover for possible meningitis as well and discharged to rehab on Ceftriaxone 2g IV daily x 4 weeks.   No chest discomfort, SOB  Dr. Delane Ginger referred to [facility] and order for Echo placed. Will try to secure Echo appointment for the same day as PCP appointment.      Nicotine dependence  - chronic, stable  Smoker, 0.5 PPD x 20 years   5-6 cigs/day  Revisit readiness at next appt. Not interested in quitting.      Sexual health & secondary prevention  - chronic, stable  Not in relationship.  LSE recently.  Parts of body used during sex include: mouth and penis. Does not have anal sex. Gives and receives oral sex. Insertive partner for vaginal sex.   Since last visit has had no sex and has not had add'l STI screening.  He sometimes uses condom for vaginal sex  He does routinely discuss HIV status with partner(s).  Have not discussed interest in having children.    Lab Results   Component Value Date    RPR Nonreactive 10/13/2021    RPR Nonreactive 04/29/2020    CTNAA Positive (A) 10/13/2021    CTNAA Negative 10/13/2021    CTNAA Negative 10/13/2021    GCNAA Positive (A) 10/13/2021    GCNAA Negative 10/13/2021    GCNAA Negative 10/13/2021    SPECSOURCE Rectum 10/13/2021    SPECSOURCE Throat 10/13/2021    SPECSOURCE Urine (Male) 10/13/2021     GC/CT NAATs - obtained today from all exposed anatomical site(s)  RPR - for screening obtained today  UPDATE: +GC/CT of rectum, needs treatment. Gwynneth Albright, RN has tried to reach out to inform patient of need for treatment.      Health maintenance  - chronic, stable  PCP: Dr. Glenetta Hew with [facility].    Oral health  He does  have a dentist. Last dental exam 2022.    Eye health  He does not use corrective lenses. Last eye exam unknown.    Metabolic conditions  Wt Readings from Last 5 Encounters:   11/10/21 89.4 kg (197 lb)   11/09/21 90.5 kg (199 lb 9.6 oz)   11/03/21 87.5 kg (193 lb)   10/13/21 87.5 kg (193 lb)   10/13/21 (P) 87.7 kg (193 lb 6.4 oz)     Lab Results   Component Value Date    CREATININE 0.99 10/13/2021    GLUCOSEU Negative 08/25/2020    ALBCRERAT  10/13/2021      Comment:      Unable to calculate.    GLU 91 10/13/2021    A1C 4.7 (L) 10/13/2021    ALT 13 10/13/2021    ALT 26 08/25/2020    ALT 50 (H) 04/29/2020     # Kidney health - creatinine and UA today  # Bone health -      - 40-50 YO: OPTIMIZE Ca, VIT D, EXERCISE & CALCULATE FRAX SCORE  # Diabetes assessment - random glucose today  # NAFLD assessment -  suspicion for NAFLD low    Communicable diseases  Lab Results   Component Value Date    QFTTBGOLD Negative 04/29/2020    HEPAIGG Nonreactive 10/13/2021    HEPBSAB Nonreactive 10/13/2021    HEPCAB Nonreactive 10/13/2021     # TB screening - no longer needed; negative IGRA, low risk 04/29/20  # Hepatitis screening -  As noted : 05/12/19 Hep A IgM NR  05/12/19 Hep B C IgM NR - Needs Hep A and Hep B vaccination  # MMR screening - not assessed    Cancer screening  No results found for: PSASCRN, PSA, PAP, FINALDX  # Anorectal - not yet done  # Colorectal - screening not indicated  # Liver - no screening indicated  # Lung - screening not indicated  # Prostate - screening not indicated    Cardiovascular disease  Lab Results   Component Value Date    CHOL 177 10/13/2021    HDL 43 10/13/2021    LDL 88 10/13/2021    NONHDL 134 (H) 10/13/2021    TRIG 228 (H) 10/13/2021     # The ASCVD Risk score (Arnett DK, et al., 07/20/2017) failed to calculate.  - is not taking aspirin   - is taking statin (needs to restart)  - BP control good  - current smoker  # AAA screening - no indication for screening    Immunization History   Administered Date(s) Administered    COVID-19 VAC,MRNA,TRIS(12Y UP)(PFIZER)(GRAY CAP) 08/13/2019, 04/29/2020    COVID-19 VACC,MRNA(BOOSTER)OR(6-48YR)MODERNA 08/25/2020    COVID-19 VACC,MRNA,(PFIZER)(PF) 08/13/2019, 11/03/2019, 04/29/2020    COVID-19 VACCINE,MRNA(MODERNA)(PF) 08/25/2020    Covid-19 Vac, (74yr+) (Spikevax) Monovalent Xbb.1.5 Moder  11/03/2021    HEPATITIS B VACCINE ADULT, ADJUVANTED, IM(HEPLISAV B) 10/13/2021    Influenza Vaccine Quad(IM)6 MO-Adult(PF) 10/13/2021    Pneumococcal Conjugate 20-valent 08/25/2020     Immunizations today - hepatitis B and influenza  Hep B #2 at appropriate time  Hep A #1  New CoVid bosoter    I personally spent 60 minutes face-to-face and non-face-to-face in the care of this patient, which includes all pre, intra, and post visit time on the date of service.  All documented time was specific to the E/M visit and does not include any procedures that may have been performed.      Disposition  Next appointment: 4 weeks      To do @ next RTC  Discuss anal pap  Hep A#1  Hep B #2    Varney Daily, FNP-BC  [facility] at [facility]  [address] [place], [place] [address]    Phone: [phone]   Fax: [phone]             Subjective      Chief Complaint   HIV follow up    HPI  In addition to details in A&P above:  Has more bumps around rectum, would like to see GI again and treatment to bridge him till he can be seen  No interim illnesses.  Has been out of meds (all meds)  Has multiple new ideations which is out of character for the patient. He discussed the following:  Sister passed away in Jul 20, 2021, in [place]. He reports he believes she was killed by their family members for the insurance money ($1 million). He then had a dream in July 2023 where she told him that she was not buried and still in the morgue which upset him quite a bit.  He grew up in  foster care along with his sister. He recently found out that his mother was a prostitute and became pregnant with him from one of her clients. This father was wealthy and owned a company, which the father bequeathed to him (company and a house) over his half siblings. He reports that those siblings cheated him out of this inheritance unbeknownst to him.   The foster family he grew up with reportedly bequeathed their house to him as well and he reports that the same family members cheated him out of this as well, by forming a management company that took over the property.   He believes that all the friends he has made in [place] have turned out to be family members that have conspired to destroy his life and happiness. He believes he is being watched by cameras in his home and that his mobile phone is bugged. Due to feeling watched, he has missed doses of his Biktarvy. During our visit when I mentioned his HIV diagnosis, he refused to speak until we put his mobile phone out of the room so he could not be heard.  He also believes that his HIV AVS has been put on the internet to expose his HIV diagnosis as well as information about his genital wart treatment (surgical report?)        Past Medical History:   Diagnosis Date    Bicuspid aortic valve     mod-severe AI    Endocarditis 02/2018    S. 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 05/26/19 [name] 12g continuous infusion+Rifampin 300mg  Q8H, 06/25/19 Change to Cefazolin 2g Q8H+Rifampin, TTE 5/4 showed Paravalvular echo lucency measuring approximately 2cm wide as previously seen on TEE. It extends 180 degrees along the right border of the homograft. Appears to be dehiscence and rocking of the aortic homograft.    HIV disease (CMS-HCC) 2008    Dx. in [place]. Denies OI. History of nonadherence to ART. Last on Symtuza. Started on Dolutegravir 50mg  BID and Truvada daily.    Myocardial infarction (CMS-HCC)     Feb 2020    Stroke (CMS-HCC)          Social History  Background - Born and raised in [place], [place]. Moved to [place] with friend.    Housing - in apartment  by himself  School / Work & Benefits - on disability    Social History     Tobacco Use    Smoking status: Every Day     Current packs/day: 0.25     Average packs/day: 0.3 packs/day for 28.0 years (7.0 ttl pk-yrs)     Types: Cigarettes    Smokeless tobacco: Never    Tobacco comments:     4 cigarettes per day   Vaping Use    Vaping Use: Every day   Substance Use Topics    Alcohol use: Yes     Alcohol/week: 3.0 standard drinks of alcohol     Types: 3 Cans of beer per week    Drug use: Not Currently Types: Marijuana, Cocaine     Comment: Rare use         Review of Systems  As per HPI. All others negative.      Medications and Allergies  He has a current medication list which includes the following prescription(s): atorvastatin, biktarvy, enoxaparin, podofilox, and warfarin.  Allergies: Patient has no known allergies.      Family History  His family history includes Mental illness in his mother; No Known Problems in his brother, father, and sister.          Objective:      There were no vitals taken for this visit.  Wt Readings from Last 3 Encounters:   11/10/21 89.4 kg (197 lb)   11/09/21 90.5 kg (199 lb 9.6 oz)   11/03/21 87.5 kg (193 lb)      10/13/2021   Vitals - 1 value per visit    BP 138/85    BP 138/85    Heart Rate 92    Heart Rate 92    Temp 36.7 ??C (98 ??F)    Temp 36.7 ??C (98 ??F)    Resp 18    Weight (lb) 193 lbs    Weight (lb) 193 lbs 6 oz    Weight (kg) 87.544 kg    Weight (kg) 87.726 kg    Height IN (Length) 72 in    Height IN (Length) 72 in    Height (CM) (Length) 182.9 cm    Height (CM) (Length) 182.9 cm    BMI (Calculated) 26.17 kg/m2    BMI (Calculated) 26.22 kg/m2    BSA (Calculated - sq m) 2.11 m2    BSA (Calculated - sq m) 2.11 m2    Visit Report --    Visit Report --    Visit Report --    Visit Report --          Const Exhibiting paranoid/delusional thinking , noticeable weight loss   Eyes sclerae anicteric, noninjected OU   ENT no thrush, leukoplakia or oral lesions   Lymph no cervical or supraclavicular LAD   CV RRR. No murmurs. No rub or gallop. S1/S2.   Lungs CTAB ant/post, normal work of breathing   GI Soft, no organomegaly. NTND. NABS.   GU deferred   Rectal About 1.5in cluster of genital warts at about 11:30 position of anus   Skin no petechiae, ecchymoses or obvious rashes on clothed exam   MSK no joint tenderness and normal ROM throughout   Neuro CN grossly intact   Psych Tearful  affect. Eye contact good.  Fluent speech.     Laboratory Data  Reviewed in Epic today, using Synopsis and Chart Review filters.    Lab Results   Component Value Date    CREATININE 0.99 10/13/2021    QFTTBGOLD Negative 04/29/2020    HEPCAB Nonreactive 10/13/2021    CHOL 177 10/13/2021    HDL 43 10/13/2021    LDL 88 10/13/2021    NONHDL 134 (H) 10/13/2021    TRIG 228 (H) 10/13/2021    A1C 4.7 (L) 10/13/2021

## 2022-02-22 NOTE — Unmapped (Signed)
Patient needs to reschedule missed appointment with Corazon

## 2022-03-01 NOTE — Unmapped (Signed)
[facility] Nursing Attempt        TYPE OF ENCOUNTER: Phone     REASON: Appointment    ASSESSMENT: Pt. Did not come to both his ID appt and appt with Internal Medicine on 1.25. RN contacted patient today to check in, but there was no answer.     ADHERENCE: Not assessed at this time     INTERVENTION:  RN left patient a VM and sent the following text:      RN: [name]! This is Jocee Kissick, I was just checking in. How is everything?    PLAN: RN will continue to follow up with patient     CONTACT DURATION: 1 min

## 2022-03-07 ENCOUNTER — Ambulatory Visit: Admit: 2022-03-07 | Discharge: 2022-03-07 | Payer: MEDICARE | Attending: Family | Primary: Family

## 2022-03-07 ENCOUNTER — Ambulatory Visit: Admit: 2022-03-07 | Discharge: 2022-03-07 | Payer: MEDICARE

## 2022-03-07 DIAGNOSIS — Z952 Presence of prosthetic heart valve: Principal | ICD-10-CM

## 2022-03-07 DIAGNOSIS — F17200 Nicotine dependence, unspecified, uncomplicated: Principal | ICD-10-CM

## 2022-03-07 DIAGNOSIS — I358 Other nonrheumatic aortic valve disorders: Principal | ICD-10-CM

## 2022-03-07 DIAGNOSIS — B349 Viral infection, unspecified: Principal | ICD-10-CM

## 2022-03-07 DIAGNOSIS — Z113 Encounter for screening for infections with a predominantly sexual mode of transmission: Principal | ICD-10-CM

## 2022-03-07 DIAGNOSIS — I351 Nonrheumatic aortic (valve) insufficiency: Principal | ICD-10-CM

## 2022-03-07 DIAGNOSIS — Z9889 Other specified postprocedural states: Principal | ICD-10-CM

## 2022-03-07 DIAGNOSIS — Z23 Encounter for immunization: Principal | ICD-10-CM

## 2022-03-07 DIAGNOSIS — Z1159 Encounter for screening for other viral diseases: Principal | ICD-10-CM

## 2022-03-07 DIAGNOSIS — Z1633 Resistance to antiviral drug(s): Principal | ICD-10-CM

## 2022-03-07 DIAGNOSIS — Z0184 Encounter for antibody response examination: Principal | ICD-10-CM

## 2022-03-07 DIAGNOSIS — I442 Atrioventricular block, complete: Principal | ICD-10-CM

## 2022-03-07 DIAGNOSIS — Z720 Tobacco use: Principal | ICD-10-CM

## 2022-03-07 DIAGNOSIS — Z7289 Other problems related to lifestyle: Principal | ICD-10-CM

## 2022-03-07 DIAGNOSIS — B20 Human immunodeficiency virus [HIV] disease: Principal | ICD-10-CM

## 2022-03-07 DIAGNOSIS — Z5181 Encounter for therapeutic drug level monitoring: Principal | ICD-10-CM

## 2022-03-07 DIAGNOSIS — Z79899 Other long term (current) drug therapy: Principal | ICD-10-CM

## 2022-03-07 DIAGNOSIS — Z5982 Transportation insecurity: Principal | ICD-10-CM

## 2022-03-07 DIAGNOSIS — Z9189 Other specified personal risk factors, not elsewhere classified: Principal | ICD-10-CM

## 2022-03-07 LAB — BASIC METABOLIC PANEL
ANION GAP: 7 mmol/L (ref 5–14)
BLOOD UREA NITROGEN: 11 mg/dL (ref 9–23)
BUN / CREAT RATIO: 11
CALCIUM: 9.3 mg/dL (ref 8.7–10.4)
CHLORIDE: 107 mmol/L (ref 98–107)
CO2: 24.8 mmol/L (ref 20.0–31.0)
CREATININE: 0.97 mg/dL
EGFR CKD-EPI (2021) MALE: >90 mL/min/{1.73_m2} (ref >=60)
GLUCOSE RANDOM: 87 mg/dL (ref 70–179)
POTASSIUM: 4.3 mmol/L (ref 3.4–4.8)
SODIUM: 139 mmol/L (ref 135–145)

## 2022-03-07 LAB — LYMPH MARKER LIMITED,FLOW
ABSOLUTE CD3 CNT: 792 {cells}/uL — ABNORMAL LOW (ref 915–3400)
ABSOLUTE CD4 CNT: 324 {cells}/uL — ABNORMAL LOW (ref 510–2320)
ABSOLUTE CD8 CNT: 444 {cells}/uL (ref 180–1520)
CD3% (T CELLS): 66 % (ref 61–86)
CD4% (T HELPER): 27 % — ABNORMAL LOW (ref 34–58)
CD4:CD8 RATIO: 0.7 — ABNORMAL LOW (ref 0.9–4.8)
CD8% T SUPPRESR: 37 % (ref 12–38)

## 2022-03-07 LAB — CBC W/ AUTO DIFF
BASOPHILS ABSOLUTE COUNT: 0 10*9/L (ref 0.0–0.1)
BASOPHILS RELATIVE PERCENT: 0.5 %
EOSINOPHILS ABSOLUTE COUNT: 0.1 10*9/L (ref 0.0–0.5)
EOSINOPHILS RELATIVE PERCENT: 0.9 %
HEMATOCRIT: 49.4 % — ABNORMAL HIGH (ref 39.0–48.0)
HEMOGLOBIN: 16.1 g/dL (ref 12.9–16.5)
LYMPHOCYTES ABSOLUTE COUNT: 1.2 10*9/L (ref 1.1–3.6)
LYMPHOCYTES RELATIVE PERCENT: 20.8 %
MEAN CORPUSCULAR HEMOGLOBIN CONC: 32.6 g/dL (ref 32.0–36.0)
MEAN CORPUSCULAR HEMOGLOBIN: 28.7 pg (ref 25.9–32.4)
MEAN CORPUSCULAR VOLUME: 88.2 fL (ref 77.6–95.7)
MEAN PLATELET VOLUME: 7.6 fL (ref 6.8–10.7)
MONOCYTES ABSOLUTE COUNT: 0.4 10*9/L (ref 0.3–0.8)
MONOCYTES RELATIVE PERCENT: 7.1 %
NEUTROPHILS ABSOLUTE COUNT: 4.1 10*9/L (ref 1.8–7.8)
NEUTROPHILS RELATIVE PERCENT: 70.7 %
PLATELET COUNT: 284 10*9/L (ref 150–450)
RED BLOOD CELL COUNT: 5.6 10*12/L (ref 4.26–5.60)
RED CELL DISTRIBUTION WIDTH: 12.4 % (ref 12.2–15.2)
WBC ADJUSTED: 5.8 10*9/L (ref 3.6–11.2)

## 2022-03-07 LAB — BILIRUBIN, TOTAL: BILIRUBIN TOTAL: 0.9 mg/dL (ref 0.3–1.2)

## 2022-03-07 LAB — ALT: ALT (SGPT): 21 U/L (ref 10–49)

## 2022-03-07 LAB — AST: AST (SGOT): 27 U/L (ref <=34)

## 2022-03-07 MED ORDER — NICOTINE 21 MG/24 HR DAILY TRANSDERMAL PATCH
MEDICATED_PATCH | TRANSDERMAL | 1 refills | 0 days | Status: CP
Start: 2022-03-07 — End: 2022-03-07

## 2022-03-07 MED ORDER — BIKTARVY 50 MG-200 MG-25 MG TABLET
ORAL_TABLET | Freq: Every day | ORAL | 11 refills | 30 days | Status: CP
Start: 2022-03-07 — End: ?
  Filled 2022-03-12: qty 30, 30d supply, fill #0

## 2022-03-07 MED ORDER — NICOTINE (POLACRILEX) 2 MG GUM
Freq: Two times a day (BID) | BUCCAL | 3 refills | 25 days | Status: CP | PRN
Start: 2022-03-07 — End: ?
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
## 2022-03-07 NOTE — Unmapped (Signed)
COVID Education:  Make sure you perform good hand washing (lasting 20 seconds), continue to social distance and limit close personal contact (which may include new sexual partners or having multiple partners during this period).  Try to isolate at home but please find ways to keep in touch with those close to you, such as meeting up with them electronically or socially distanced, and the ability to go outdoors alone or separated from others  If you become ill with fever, respiratory illness, sudden loss of taste and smell, stomach issues, diarrhea, nausea, vomiting - contact clinic for further instructions.  You should go to the emergency department if you develop systems such as shortness of breath, confusion, lightheadedness when standing, high fever.   Here is some information about HIV and CoVid vaccines: [email or web address]  If you're interested in receiving the CoVid vaccine when you're eligible, here are some resources for you to check and make an appointment:  Your local health department   [email or web address] through [facility]  [email or web address]  [facility] (if you are an established patient with them)  [email or web address]    URGENT CARE  Please call ahead to speak with the nursing staff if you are in need of an urgent appointment.       MEDICATIONS  For refills please contact your pharmacy and ask them to electronically send or fax the request to the clinic.   Please bring all medications in original bottles to every appointment.    [facility] (formerly ADAP) or [employer] Eligibility (required even if you do not receive medication through [facility])  Please remember to renew your Juanell Fairly eligibility during renewal periods which occur twice a year: January-March and July-September.     The following are needed for each renewal:   - [facility] Identification (if you don't have one, then a bill with your name and address in [place])   - proof of income (award letter, W-2, or last three check stubs)   If you are unable to come in for renewal, let us know if we can mail, fax or e-mail paperwork to you.   HMAP Contact: [phone].     Lab info:  Your most recent CD4 T-cell counts and viral loads are below. Here are a few things to keep in mind when looking at your numbers:  Our goal is to get your virus to be undetectable and keep it undetectable. If the virus is undetectable you are much more likely to stay healthy.  We consider your viral load to be undetectable if it says <40 or if it says Not detected.  For most people, we're checking CD4 counts every other visit (once or twice a year, or sometimes even less).  It's normal for your CD4 count to be different from visit to visit.   You can help by taking your medications at about the same time, every single day. If you're having trouble with taking your medications, it's important to let us know.    Lab Results   Component Value Date    ACD4 416 (L) 10/13/2021    CD4 26 (L) 10/13/2021    HIVCP 34 (H) 10/13/2021    HIVRS Detected (A) 10/13/2021        Please note that your laboratory and other results may be visible to you in real time, possibly before they reach your provider. Please allow 48 hours for clinical interpretation of these results. Importantly, even if a result is flagged as abnormal, it may not be one  that impacts your health.    It was nice to have a visit with you today!  Follow-up information:        Provider today:  Varney Daily, FNP-BC      ID CLINIC address:   [facility] Infectious Diseases Clinic at [facility]  [address]  [place], [place] [address]    Contact information:    The ID clinic phone number is [phone]   The ID clinic fax number is [phone]  For urgent issues on nights and weekends: Call the ID Physician on-call through the [facility] Operator at [phone].    Please sign up for My [facility] Chart - This is a great way to review your labs and track your appointments    Please try to arrive 30 minutes BEFORE your scheduled appointment time!  This will give you time to fill out any front desk paperwork needed for your visit, and allow you to be seen as close to your scheduled appointment time as possible.

## 2022-03-07 NOTE — Unmapped (Signed)
Internal Medicine Clinic Visit    Reason for visit: TUD    A/P:         1. Tobacco use disorder    2. Aortic valve endocarditis    3. Severe aortic insufficiency    4. Complete heart block (CMS-HCC)    5. HIV infection, unspecified symptom status (CMS-HCC)    6. Hx of repair of aortic root    7. Status post mechanical aortic valve replacement    8. Transportation insecurity      TUD (1ppd x 20 years):   Currently using 1ppd for the last 20 years. The last time he cut down was 2 years ago, was a 1/2ppd at that time. He is not sure what helped him cut down at that time. He is interested in cutting down at this time with the help of patches +/- gum.   - Nicotine patch daily  - Nicotine gum prn made available  - PCP to follow     HIV (dx 2008):  Followed by ID, last seen today 03/07/2022. No prior opportunistic infections. Has been nonadherent with ART in the past, last took [place]. Moved to [facility] from [place]. 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   - Continue Biktarvy  - Reiterated importance of medication compliance    Genital Warts:   S/p cryotx and then Condylox without relief.   - ID following closely  - Condylox    Delusional/Paranoid Thinking:   In the setting of ongoing PSUD, used crystal meth yesterday. Also using cocaine, marijuana, alcohol, and tobacco. Although ID clinic mentioned to me that he is having delusional/paranoid thinking, patient stated to me that he is not having this.   - PCP to follow and establish therapeutic relationship    PSUD:  Last used crystal meth yesterday. Uses cocaine monthly. Alcohol daily. All this information was provided to ID provider. When I asked him about this, patient states that he does not use any drugs.   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  - Social work consulted.   - PCP to follow    Return in about 4 weeks (around 04/04/2022) for Recheck TUD.    Staffed with Dr. Gerhard Munch, discussed  __________________________________________________________    HPI:  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     See above for full A&P.   __________________________________________________________    Medications:  Reviewed in EPIC  __________________________________________________________    Physical Exam:   Vital Signs:  Vitals:    03/07/22 1026   BP: 113/76   BP Site: L Arm   BP Position: Sitting   BP Cuff Size: Large   Pulse: 91   Temp: 35.1 ??C (  95.2 ??F)   TempSrc: Temporal   SpO2: 96%   Weight: 90.4 kg (199 lb 3.2 oz)   Height: 182.9 cm (6' 0.01)          Gen: Well appearing, NAD  CV: RRR, no murmurs  Pulm: CTA bilaterally, no crackles or wheezes  Abd: Soft, NTND, normal BS.   Ext: No edema    PHQ-9 Score:  PHQ-9 TOTAL SCORE: 0  GAD-7 Score:  GAD-7 Total Score: 5    Medication adherence and barriers to the treatment plan have been addressed. Opportunities to optimize healthy behaviors have been discussed. Patient / caregiver voiced understanding.
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
## 2022-03-07 NOTE — Unmapped (Addendum)
Name: Warren [name].  Date: 03/07/2022  Address: [address]  [place] [address]   [place] of Residence:  [facility]  Phone: [phone]     Started assessment with patient options: in clinic     Is this the same address for mailing? Yes  If No, Mailing Address is:     Housing Financial controller and Medicare Part C    Tax Filing Status  I did not file taxes     Employment Status  Medically Unable to Work    Income  Tree surgeon (Retirement/Survivor's/Disability)    If no or low income, how are you meeting your basic needs?  Food Stamps/EBT, Patent examiner, and Transportation Assistance    List Tax Household Members including relationship to you:   N/a    Someone in my household receives: No Household Income/Deductions of any kind  Specify who: n/a    Medication Access/Barriers: none    Do you have a current diagnosis for Hepatitis C?  Lab Results   Component Value Date    HEPCAB Nonreactive 10/13/2021       Have you used tobacco products four or more times per week in the last six months?  Yes    Teacher, adult education  Patient has affordable insurance through [employer], [place], and or Employment and is not eligible.    Patient given ACA education if they qualified based on answers to questions above.     MyChart  Do you have an active MyChart account? No     If MyChart is not set up, informed patient on how to set up MyChart Yes    Patient was informed of the following programs;   N/A    The following applications/handouts were given to patient:   N/A    The following forms were also started with the patient:   N/A    Juanell Fairly application status: Incomplete; patient needs to send 2023 or 2024 ssi letter    Patient is applying for [employer] on Charges Only     Additional Comments: No issues accessing meds. Will send income via email.      Rhetta Mura  ID Clinic Benefits Counselor  Time of Intervention-19mins

## 2022-03-07 NOTE — Unmapped (Signed)
It was a pleasure seeing you in clinic today. We have made the following plan.     1. We have prescribed you nicotine patches and gum to help you cut down on your tobacco use.     Otherwise, we look forward to seeing you in clinic at your next appointment!

## 2022-03-07 NOTE — Unmapped (Signed)
Immediately after or during the visit, I reviewed with the resident the medical history and the resident???s findings on physical examination.  I discussed with the resident the patient???s diagnosis and concur with the treatment plan as documented in the resident note. Doug Sou, MD

## 2022-03-08 DIAGNOSIS — Z9189 Other specified personal risk factors, not elsewhere classified: Principal | ICD-10-CM

## 2022-03-08 DIAGNOSIS — B20 Human immunodeficiency virus [HIV] disease: Principal | ICD-10-CM

## 2022-03-08 LAB — REFERRAL LABORATORY TEST, OTHER

## 2022-03-08 LAB — HIV RNA, QUANTITATIVE, PCR
HIV RNA LOG10: 3.88 {Log_copies}/mL — ABNORMAL HIGH (ref <0.00)
HIV RNA QNT RSLT: DETECTED — AB
HIV RNA: 7519 {copies}/mL — ABNORMAL HIGH (ref <0)

## 2022-03-08 LAB — SYPHILIS SCREEN: SYPHILIS RPR SCREEN: NONREACTIVE

## 2022-03-08 NOTE — Unmapped (Signed)
[facility] Refill Coordination Note    Specialty Medication(s) to be Shipped:   Infectious Disease: Biktarvy    Other medication(s) to be shipped:  warfarin      Warren Dove., DOB: 06-Aug-1975  Phone: [phone] (home)       All above HIPAA information was verified with patient.     Was a Nurse, learning disability used for this call? No    Completed refill call assessment today to schedule patient's medication shipment from the [facility] [phone]).  All relevant notes have been reviewed.     Specialty medication(s) and dose(s) confirmed: Regimen is correct and unchanged.   Changes to medications: Darnay reports no changes at this time.  Changes to insurance: No  New side effects reported not previously addressed with a pharmacist or physician: None reported  Questions for the pharmacist: No    Confirmed patient received a Conservation officer, historic buildings and a Surveyor, mining with first shipment. The patient will receive a drug information handout for each medication shipped and additional FDA Medication Guides as required.       DISEASE/MEDICATION-SPECIFIC INFORMATION        N/A    SPECIALTY MEDICATION ADHERENCE     Medication Adherence    Patient reported X missed doses in the last month: 0  Specialty Medication: Biktarvy 50-200-25 mg  Patient is on additional specialty medications: No  Patient is on more than two specialty medications: No  Any gaps in refill history greater than 2 weeks in the last 3 months: no  Demonstrates understanding of importance of adherence: yes  Informant: patient  Reliability of informant: reliable  Provider-estimated medication adherence level: good  Reasons for non-adherence: no problems identified  Confirmed plan for next specialty medication refill: delivery by pharmacy  Refills needed for supportive medications: not needed          Refill Coordination    Has the Patients' Contact Information Changed: No  Is the Shipping Address Different: No Were doses missed due to medication being on hold? No    Biktarvy  50-200-25 mg: 6 days of medicine on hand       REFERRAL TO PHARMACIST     Referral to the pharmacist: Not needed      [facility]     Shipping address confirmed in Epic.     Patient was notified of new phone menu : No    Delivery Scheduled: Yes, Expected medication delivery date: 02/20.     Medication will be delivered via UPS to the prescription address in Epic WAM.    Warren [name]   [facility] Specialty Technician

## 2022-03-11 LAB — HEPATITIS A IGG: HEPATITIS A IGG: NONREACTIVE

## 2022-03-11 LAB — HEPATITIS C ANTIBODY: HEPATITIS C ANTIBODY: NONREACTIVE

## 2022-03-11 LAB — HEPATITIS B SURFACE ANTIBODY
HEPATITIS B SURFACE ANTIBODY QUANT: 27.6 m[IU]/mL — ABNORMAL HIGH (ref <8.00)
HEPATITIS B SURFACE ANTIBODY: REACTIVE — AB

## 2022-03-11 LAB — HEPATITIS B CORE ANTIBODY, TOTAL: HEPATITIS B CORE TOTAL ANTIBODY: NONREACTIVE

## 2022-03-11 LAB — HEPATITIS B SURFACE ANTIGEN: HEPATITIS B SURFACE ANTIGEN: NONREACTIVE

## 2022-03-12 MED FILL — WARFARIN 5 MG TABLET: ORAL | 30 days supply | Qty: 30 | Fill #2

## 2022-03-19 LAB — REFERRAL LABORATORY TEST, OTHER

## 2022-03-20 NOTE — Unmapped (Addendum)
[facility] Nursing Attempt       TYPE OF ENCOUNTER: Phone     REASON: Follow up and RW Income Documents    ASSESSMENT: RN contacted the patient to follow up on his last visit, and to find out if he was able to find his SSI letter for RW renewal. There was no answer at the time of the call. RN left VM for the patient to call back.     CONTACT DURATION: 1 min

## 2022-03-22 NOTE — Unmapped (Signed)
Opened in error

## 2022-03-28 NOTE — Unmapped (Signed)
Patient completed RW paperwork. Patient is eligible for RW B&C grant services and Caps on Charges. IPL= 110%;FPL= 110%. Expires: 03/28/2022- RW1    Warren [name]  ID Clinic Benefits Counselor  Time of Intervention-17mins

## 2022-03-28 NOTE — Unmapped (Signed)
[facility] Nursing Note        TYPE OF ENCOUNTER: Phone      REASON: Proof of Income for RW and visit follow up      ASSESSMENT: RN contacted patient to remind him to send in his SSI letter to complete his RW application.:    RN: I just wanted to remind you that we still need a copy of your Social security award letter for the benefits team   CT: Ok. Thank you. Give me 15 minutes, and I'll send it to you if that's ok?  VH:QION, you can text me a picture  CT: Copy  CT: Sent Image of SSI letter       ADHERENCE: Not assessed at this time      INTERVENTION:  Reminded patient to send in income documents for RW renewal     PLAN: RN will continue to follow up      CONTACT DURATION: 5 min

## 2022-03-28 NOTE — Unmapped (Signed)
[facility] Nursing Note        TYPE OF ENCOUNTER: Phone      REASON: Proof of Income for RW and visit follow up      ASSESSMENT: Pt. Sent his SSI letter from 2021 on Friday. The benefits team needs a more recent letter from 2024 or 2023, to update the patient's RW. Please see the text encounter below:     RN: This was a copy of your 2021 statement. Do you have anything more recent from 2023?    CT: Image sent of 2024 SSI letter.     RN: Perfect! how you doing?    CT: Everything is good. Thank you for helping me. How is your day going so far?    RN: Your welcome! My day is good. have you started taking your meds again?    CT: Yes. Everything is back on track     RN: Good. Promise that you will let me know if you need something. I'll be checking in to make sure you are taking your meds. Your viral load was up the last time you were here.     CT: I appreciate at you. Thank u for the update. I'm on it promise     ADHERENCE: Pt. States  Everything is back on track when asked if he started taking his Biktarvy again.      INTERVENTION:  RN forwarded the patient's SSI letter to the Benefits team. RW Application is now complete.      PLAN: RN will continue to follow up      CONTACT DURATION: 5 min

## 2022-04-13 NOTE — Unmapped (Unsigned)
The [facility] has made a second and final attempt to reach this patient to refill the following medication:Biktarvy.      We have left voicemails on the following phone numbers: [phone] and have sent a text message to the following phone numbers: [phone] .    Dates contacted: 3/14, 3/22  Last scheduled delivery: shipped 2/19    The patient may be at risk of non-compliance with this medication. The patient should call the [facility] at [phone]  Option 4, then Option 2 (all other specialty patients) to refill medication.    Warren [name]   [facility] Specialty Technician

## 2022-04-13 NOTE — Unmapped (Signed)
6 month update- [facility] Goal  Plan      04/13/2022 6 month Update: Warren [name] has faced many challenges with medication adherence and appointment attendance due to challenges with his mental health vs. Drug induced paranoia. Pt. Has been afraid to take his medication because he believes that there are cameras in his light bulbs, that are recording him and being posted on the black web. It has also been difficult to reach the patient because he says that he is under surveillance, and people are listening to his phone calls, and seeing text messages. HIV Viral Load is detectable at 7,519.    RN has made several attempts to encourage the patient to talk with a mental health provider, but patient refuses. Another potential barrier to care is the patient's health insurance. Pt. Has [employer], and there is a possibility that this insurance will be out of network with [facility] by April 1st. This will pose a grave risk to the patient's health. Pt. Has built a level of trust with [facility] providers, that may be difficult to build with providers at another organization. It also may be difficult for the patient to navigate a health care system, if he has episodes of paranoia.    Over the next two weeks, RN will try to help patient complete a continuity of care form for 90 day extension to receive care at [facility]. This will hopefully give [place] time to sit down with patient to discuss a plan of care, and discuss more about accessing mental health services.      Goal Plans      Adherence  NEED: Patient identified or is identified as having difficulty with medication adherence and medical appointment attendance.                 SHORT TERM GOALS:   Identify barriers to adherence an transportation  Demonstrate understanding of consequences of poor adherence - ie resistance, harm that having detectable virus can do to body, increase in possibility of transmission to others.  Improve adherence to </= 1 missed dose per week which will be evidenced by undetectable viral load      PERSON:  Nurse                 INTERVENTIONS:   Nurse  will establish therapeutic/trust relationship and assess medication readiness with patient through use of motivational interviewing techniques  Nurse will provide education regarding the importance of medication  Adherence as well as consequences of non adherence.  Nurse will discuss the importance of honest communication with nurse/staff regarding level of adherence, difficulties with adherence, and side effects  Nurse  will discuss strategies for medication adherence and provide pt with medication adherence tools  Nurse and Patient will coordinate with financial counselor regarding need financial assistance related to obtaining medications (Co-pay card, ADAP, SPAP, ICAP, etc)..  Nurse will contact patient by by phone on a weekly basis  to assess level of adherence, presence of side effects, provide continued education, offer support and encouragement.  Pt will attend regular appointments in clinic at least 3x per year.    Pt will notify nurse  if barriers to appointment attendance arise and nurse/SW will assist pt to access regular transportation assistance.                 OUTCOME:   Documented patient report of improved adherence in EPIC  Documented lab evidence (< viral load) of improved adherence as well as patient remaining in care with  at least 3 visits per year to see provider      DATE REVIEW/TIME LINE: 6 MONTHS  Patient is aware of and in agreement with this plan      Nursing Diagnosis:  Knowledge Deficit r/t HIV diagnosis:     2. Goal:  Pt will demonstrate increased knowledge of HIV diagnosis.     Intervention:    Assess pt knowledge of disease process.  Assess pt learning ability and barriers to learning.  Teach pt about appointment and treatment schedules.  Teach pt about OI signs and symptoms, prophylaxis, prevention and treatment.  Teach pt about general terminology r/t diagnosis:  CD4, VL, ARV, OI.  Teach patient about medications:  typical regimens, common SE, importance of adherence.  Teach patient about disease transmission and prevention.  Teach pt about SE management.  Provide pt with supplemental written/oral information.  Encourage pt to include support system in learning process.  Refer pt to support system as necessary.     Outcomes:  Pt will demonstrate general knowledge r/t HIV diagnosis AEB, medication adherence, proper reporting of SE and other symptoms, absence of OI development, attending scheduled appointments, demonstration of ability to interpret lab reports, increased CD4, decreased VL and active participation in health care plan.      Nursing Diagnosis:  Ineffective Health Maintenence.     3. Goal:  Pt will comply and participate in the healthcare plan.      Interventions:   Assess the pt perception of health problems.  Assess pt knowledge of importance of health maintenance behaviors r/t HIV diagnosis (keeping scheduled appointments, monitoring lab values, medication adherence).  Assess pt beliefs about illness (religious beliefs/practices, cultural beliefs/practices, personal beliefs/practices).  Assess for potential barriers to care (familial, environmental, social, cultural).  Assess pt confidence in self to do desired behavior.  Assess pt reasons for non compliance with health care plan in the past.   Assess pt education level and best methods of learning.  Teach the patient the roles of health care providers as patient advocates.  Assist pt in following up with any outside referrals.  Teach benefits and importance of adherence to medications and health plan.   Discuss ramifications of non adherence to medications.  Discuss the importance of OI prophylaxis.  Teach patient signs and symptoms to look for that require medical attention.  Stress necessity of continued healthcare and follow up.  Link patient with appropriate support resources.  Include pt in planning of treatment.  Include pt social support system as allowed by pt.  Encourage questions and reinforce/clarify teaching as needed.  Encourage pt to actively participate in own health care plan.     Outcomes:    Pt will demonstrate effective health maintenance AEB decrease in viral load, increase in CD4, having correct pill count, attendance of medical appointments, decrease in hospital admissions/ED visits, active participation in the health care plan.

## 2022-04-16 ENCOUNTER — Ambulatory Visit: Admit: 2022-04-16 | Payer: MEDICARE | Attending: Family | Primary: Family

## 2022-04-20 ENCOUNTER — Encounter: Admit: 2022-04-20 | Discharge: 2022-04-20 | Payer: MEDICARE | Attending: Internal Medicine | Primary: Internal Medicine

## 2022-05-15 NOTE — Unmapped (Unsigned)
Cardiology Return Visit Note    Requesting Provider: Johnsie Cancel,*   Primary Provider: Bobbie Stack, MD     Reason for Consult:   This 47 y.o. male is seen at the request of Johnsie Cancel, MD for evaluation after mechanical aortic valve replacement.     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 No chest pain or heart failure symptoms   #Moderate to severe MR s/p MV repair   #Severe bradycardia s/p PPM: has been undergoing remote monitoring and demonstrated normal pacemaker function  #Previous embolic stroke: no residual deficits that he is aware of    #HLD  #Tobacco abuse: not interest in cessation at this time    #HIV    Echo from 11/13/21 notes normal LV function with normal prosthetic valve function and trivial MR  Counseled patient on the importance of continuing warfarin and the bridge with lovenox to maintain an INR of 2-3        Follow-up: No follow-ups on file.      Dictated using Animal nutritionist, please excuse typos      History of Present Illness:    Warren [name]. is a 47 y.o. 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     He has a complicated history of recurrent endocarditis and 2 sternotomies with mechanical valve in the aortic position and mitral valve repair. He does not recall all the details related to these events; however, feels as if he has recovered well from a cardiovascular standpoint. He admits that he has not taken his warfarin or lovenox for 2-3 days.     Mr. Abood returns to the clinic for a follow-up.    He smoked about 1/2 ppd and is not interested in cessation at this point   Does not know his family history     Cardiovascular History:  Bicuspid aortic valve and endocarditis s/p Aortic valve replacement and root repair   MV repair   Severe brachycardia s/p PPM  HLD  CVA (2020)  HIV    Interventions / Surgery:  Mechanical Aortic Valve Replacement (07/21/19)  -Mechanic Onyx 27/59mm valve visualized in AV position with root replacement mean gradient well seated   IMPLANTS:   Implant Name Type Inv. Item Serial No. Manufacturer Lot No. LRB No. Used Action   GRAFT, VASC GELWEAVE VALSALVA [id number] - [id number] Wynetta Emery VALSALVA [id number] [employer] [place] [id number] Implanted   VALVE, AORTIC ON-X Sandre Kitty 27-29MM - [id number] VALVE, AORTIC ON-X W/RING 27-29MM [id number] ON-X [employer] N/A 1 Implanted   - Normal Biventricular function LV EF >55% on inotropic support   -Trace TR   -Trace MR   - Trace PI.   -No dissections visualized   ICD implantation    Imaging:  CT Angio Chest Aorta  05/18/19  Cardiovascular: Heart is normal size. Aorta normal caliber measuring   maximally 3.6 cm at  the sinuses of Valsalva. There is abnormal   collection of contrast noted which extends from the aortic outflow   tract just proximal to the anterior leaflet of the aortic valve.   This is concerning for ruptured aortic outflow tract or   pseudoaneurysm. This appears contained within area of soft tissue   measuring approximately 4.2 x 3.0 cm near the aortic valve.   Stranding noted around the ascending thoracic aorta.     Echo  11/13/21   1. Aortic valve replacement (27 mm On-X bileaflet mechanical valve,  implantation date: 06/2019).    2. There is no regurgitation of the prosthetic aortic valve.    3. Aortic valve Doppler indices are consistent with normal prosthetic valve  function.    4. Mitral valve repair (02/2018).    5. There is trivial mitral valve regurgitation.    6. The left ventricle is normal in size with normal wall thickness.    7. The left ventricular systolic function is normal, LVEF is visually  estimated at > 55%.    8. The left atrium is mildly dilated in size.    9. The right ventricle is normal in size, with normal systolic function.  08/30/19   1. Normal function of mechanical aortic prosthesis. s/p redo root replacement for recurrent infective endocarditis with Bentall procedure: #27-45mm mechanical On-X valve, #32 Valsalva conduit via redo sternotomy on 07/21/2019 at [facility]. AT 92 msec. DVI   0.69, EOA 2.9, iEOA 1.42 cm2/m2. Normal mechanical bileaflet motion. The aortic valve has been repaired/replaced. Aortic valve regurgitation is trivial, likely normal washing jet. There is a mechanical valved conduit present in the aortic position.   Procedure Date: 07/21/19.    2. Left ventricular ejection fraction, by estimation, is 50 to 55%. The left ventricle has low normal function. The left ventricle has no regional wall motion abnormalities. There is mild left ventricular hypertrophy. Left ventricular diastolic   parameters are indeterminate.    3. Right ventricular systolic function is normal. The right ventricular size is normal. There is normal pulmonary artery systolic pressure. The estimated right ventricular systolic pressure is 21.3 mmHg.    4. Right atrial size was mildly dilated.    5. The mitral valve has been repaired. Trivial mitral valve regurgitation. No evidence of mitral stenosis. The mean mitral valve gradient is 2.4 mmHg with average heart rate of 89 bpm. There is a repair present in the mitral position. Procedure Date:   02/2018.    6. Aortic root/ascending aorta has been repaired/replaced.    7. The inferior vena cava is normal in size with greater than 50% respiratory variability, suggesting right atrial pressure of 3 mmHg.   07/26/19 TEE  -Mechanic Onyx 27/27mm valve visualized in AV position with root replacement mean gradient well seated   - Normal Biventricular function LV EF >55% on inotropic support   -Trace TR   -Trace MR   - Trace PI.   -No dissections visualized   05/26/19   1. S/P aortic homograft 2020. Paravalvular echo lucency measuring  approximately 2cm wide as previously seen on TEE. It extends 180 degrees along  the right border of the homograft. There appears to be dehiscence and rocking  of the aortic homograft.    2. There is mild to moderate regurgitation of the prosthetic aortic valve.    3. The left ventricle is normal in size with normal wall thickness.    4. The left ventricular systolic function is mildly decreased, LVEF is  visually estimated at 45-50%.    5. There is decreased contractile function involving the apical segment(s).    6. There is mild mitral valve regurgitation.    7. The right ventricle is normal in size, with normal systolic function.    8. There is no evidence of a significant pericardial effusion.  05/13/19 TEE  1. Left ventricular ejection fraction, by estimation, is 60 to 65%. The left ventricle has normal function. The left ventricle has no regional wall motion abnormalities.    2. Right ventricular systolic function is normal. The right ventricular size is normal.    3. No left atrial/left atrial appendage thrombus was detected.    4. The mitral valve is normal in structure. No evidence of mitral valve regurgitation. No evidence of mitral stenosis.    5. 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 The aortic valve is tricuspid. Aortic valve regurgitation is mild to moderate. No aortic stenosis is present.    6. The inferior vena cava is normal in size with greater than 50% respiratory variability, suggesting right atrial pressure of 3 mmHg.         Past Medical & Surgical History:  Past Medical History: Diagnosis Date    Bicuspid aortic valve     mod-severe AI    Endocarditis 02/2018    S. 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 05/26/19 [name] 12g continuous infusion+Rifampin 300mg  Q8H, 06/25/19 Change to Cefazolin 2g Q8H+Rifampin, TTE 5/4 showed Paravalvular echo lucency measuring approximately 2cm wide as previously seen on TEE. It extends 180 degrees along the right border of the homograft. Appears to be dehiscence and rocking of the aortic homograft.    HIV disease (CMS-HCC) 2008    Dx. in [place]. Denies OI. History of nonadherence to ART. Last on Symtuza. Started on Dolutegravir 50mg  BID and Truvada daily.    Myocardial infarction (CMS-HCC)     Feb 2020    Stroke (CMS-HCC)         Allergies:  No Known Allergies     Current Medications:    Current Outpatient Medications:     atorvastatin (LIPITOR) 40 MG tablet, Take 1 tablet (40 mg total) by mouth daily., Disp: 90 tablet, Rfl: 3    bictegrav-emtricit-tenofov ala (BIKTARVY) 50-200-25 mg tablet, Take 1 tablet by mouth daily., Disp: 30 tablet, Rfl: 11    enoxaparin (LOVENOX) 30 mg/0.3 mL Syrg, Inject 3 syringes (90 mg total) under the skin Two (2) times a day., Disp: 54 mL, Rfl: 0    nicotine (NICODERM CQ) 21 mg/24 hr patch, Place 1 patch on non hairy area of skin every morning. Take off patch 1-2 hours before bedtime. Then replace with a new patch in the morning. Rotate skin sites., Disp: 28 patch, Rfl: 1    nicotine polacrilex (NICORETTE) 2 mg gum, Apply 1 each (2 mg total) to cheek two (2) times a day as needed for smoking cessation. Weeks 1-6: Chew 1 piece of gum every 1-2 hours as needed, Disp: 50 each, Rfl: 3    warfarin (JANTOVEN) 5 MG tablet, Take 1 tablet (5 mg total) by mouth daily., Disp: 30 tablet, Rfl: 11    Social History:  Social History     Tobacco Use    Smoking status: Every Day     Current packs/day: 0.25  Average packs/day: 0.3 packs/day for 28.0 years (7.0 ttl pk-yrs)     Types: Cigarettes    Smokeless tobacco: Never    Tobacco comments:     4 cigarettes per day   Substance Use Topics    Alcohol use: Not Currently     Alcohol/week: 10.0 standard drinks of alcohol     Types: 10 Standard drinks or equivalent per week   He reports that he does not currently use drugs after having used the following drugs: Marijuana and Cocaine.    Family History:  His family history includes Mental illness in his mother; No Known Problems in his brother, father, and sister.    Review of Systems:  Review of ten systems is negative or unremarkable except as stated above.    Physical Exam:  VITAL SIGNS: There were no vitals taken for this visit.  GENERAL: NAD.  HEENT: Normocephalic and atraumatic. Conjunctivae and sclerae clear and anicteric. No xanthelasma.   NECK: Supple, without masses, thyroid enlargement or adenopathy.  CARDIOVASCULAR: RRR. Normal S1 and S2. Mechanical valve click noted loudest in the RUSB. No murmurs, rubs, or gallops.   RESPIRATORY: Normal respiratory effort without use of accessory muscles. Clear to auscultation bilaterally.  ABDOMEN: Soft, not tender or distended, with audible bowel sounds. No palpable organ enlargement or abnormal masses.  EXTREMITIES:  No pretibial or ankle edema. Ambulatory ability satisfactory.  SKIN: No rashes, ecchymosis or petechiae.  NEUROLOGIC: Appropriate mood and affect. Alert and oriented to person, place, and time. No gross motor or sensory deficits evident.    Wt Readings from Last 12 Encounters:   03/07/22 90.4 kg (199 lb 3.2 oz)   03/07/22 90.3 kg (199 lb)   11/10/21 89.4 kg (197 lb)   11/09/21 90.5 kg (199 lb 9.6 oz)   11/03/21 87.5 kg (193 lb)   10/13/21 87.5 kg (193 lb)   10/13/21 (P) 87.7 kg (193 lb 6.4 oz)   08/25/20 96.6 kg (213 lb)   08/25/20 96.7 kg (213 lb 3.2 oz)   04/29/20 (!) 101.5 kg (223 lb 12.8 oz)   11/03/19 97 kg (213 lb 12.8 oz)   10/08/19 94.8 kg (209 lb)       Pertinent Laboratory Studies:  Lab Results   Component Value Date    Triglycerides 228 (H) 10/13/2021    Triglycerides 561 (H) 08/25/2020    Triglycerides 256 (H) 05/25/2019    HDL 43 10/13/2021    HDL 28 (L) 08/25/2020    HDL 29 (L) 05/25/2019    Non-HDL Cholesterol 134 (H) 10/13/2021    Non-HDL Cholesterol 139 (H) 08/25/2020    Non-HDL Cholesterol 105 05/25/2019    LDL Calculated 88 10/13/2021    LDL Calculated  08/25/2020      Comment:      NHLBI Recommended Ranges, LDL Cholesterol, for Adults (20+yrs) (ATPIII), mg/dL  Optimal              <454  Near Optimal        100-129  Borderline High     130-159  High                160-189  Very High            >=190  NHLBI Recommended Ranges, LDL Cholesterol, for Children (2-19 yrs), mg/dL  Desirable            <098  Borderline High     110-129  High                 >=  130    Unable to calculate due to triglyceride greater than 400 mg/dL.  Unable to calculate due to triglyceride greater than 400 mg/dL.  Unable to calculate due to triglyceride greater than 400 mg/dL.    LDL Calculated 54 (L) 05/25/2019    Creatinine 0.97 03/07/2022    BUN 11 03/07/2022    Potassium 4.3 03/07/2022    Magnesium 2.2 06/02/2019    WBC 5.8 03/07/2022    HGB 16.1 03/07/2022    HCT 49.4 (H) 03/07/2022    Platelet 284 03/07/2022    INR, POC 1.0 08/25/2020    INR, POC 1.0 12/09/2019    INR 0.95 10/13/2021    INR 0.97 04/29/2020       Pertinent Test Results:         Please excuse typos, dictation completed with Dragon voice recognition software    Scribe Attestation:         This document serves as a record of the services and decisions performed by Elpidio Anis, MD on 05/17/2022. It was created on his behalf by Fayette Pho, a trained medical scribe. The creation of this document is based on the provider's statements and observations that were conveyed to the medical scribe during the patient's encounter.     (The information in this document, created by the medical scribe for me, accurately reflects the services I personally performed and the decisions made by me. I have reviewed and approved this document for accuracy.)     Elpidio Anis  MD [facility]  Division of Cardiology   [place] [facility]   Office [phone]   Pager [phone]

## 2022-05-16 NOTE — Unmapped (Addendum)
[facility] Nursing Note     TYPE OF ENCOUNTER: Phone/Text    REASON: Follow up      ADHERENCE: Not assessed at this time     ASSESSMENT: RN attempted to contact patient, but there was no answer at the time of the phone call. Left VM and sent text below:     RN: Warren [name], its Warren [name] . Im just checking in. How are you?  CT: Hi Warren [name]. Can I call u around 3?  RN: You can call this number. If I dont answer that just means Im in the room with the patient and will call right back    Pt.did not call to follow up    PLAN: RN will continue to assess    DURATION: 1 min

## 2022-05-16 NOTE — Unmapped (Signed)
[facility] Nursing Attempt      TYPE OF ENCOUNTER: Text    REASON: Check in      ASSESSMENT: RN attempted to contact the patient. Please see text encounter below:     RN: Warren [name]! How are things going?    No response from patient     PLAN:  RN will try engage the patient in care     DURATION: 1 min

## 2022-05-21 NOTE — Unmapped (Signed)
[facility] Nursing Attempt      TYPE OF ENCOUNTER: Text    REASON:Follow Up     ASSESSMENT: RN sent the following text to the patient:    RN: Warren [name], when is a good time for me to call     No response from patient     PLAN: RN will continue to reach out to the patient to engage in care.     DURATION: 1 min

## 2022-06-26 ENCOUNTER — Emergency Department
Admit: 2022-06-26 | Discharge: 2022-06-27 | Disposition: A | Discharge status: Other Acute Inpatient Hospital | Payer: MEDICARE | Attending: Student in an Organized Health Care Education/Training Program

## 2022-06-26 ENCOUNTER — Ambulatory Visit
Admit: 2022-06-26 | Discharge: 2022-06-27 | Disposition: A | Discharge status: Other Acute Inpatient Hospital | Payer: MEDICARE | Attending: Student in an Organized Health Care Education/Training Program

## 2022-06-26 DIAGNOSIS — B20 Human immunodeficiency virus [HIV] disease: Principal | ICD-10-CM

## 2022-06-26 DIAGNOSIS — Z8679 Personal history of other diseases of the circulatory system: Principal | ICD-10-CM

## 2022-06-26 DIAGNOSIS — I639 Cerebral infarction, unspecified: Principal | ICD-10-CM

## 2022-06-26 DIAGNOSIS — F1990 Other psychoactive substance use, unspecified, uncomplicated: Principal | ICD-10-CM

## 2022-06-26 DIAGNOSIS — Z952 Presence of prosthetic heart valve: Principal | ICD-10-CM

## 2022-06-26 DIAGNOSIS — Z91199 History of noncompliance with medical treatment: Principal | ICD-10-CM

## 2022-06-26 LAB — CBC W/ AUTO DIFF
BASOPHILS ABSOLUTE COUNT: 0 10*9/L (ref 0.0–0.1)
BASOPHILS RELATIVE PERCENT: 1 %
EOSINOPHILS ABSOLUTE COUNT: 0.1 10*9/L (ref 0.0–0.5)
EOSINOPHILS RELATIVE PERCENT: 1.4 %
HEMATOCRIT: 49.5 % — ABNORMAL HIGH (ref 39.0–48.0)
HEMOGLOBIN: 16.1 g/dL (ref 12.9–16.5)
LYMPHOCYTES ABSOLUTE COUNT: 1.4 10*9/L (ref 1.1–3.6)
LYMPHOCYTES RELATIVE PERCENT: 31.7 %
MEAN CORPUSCULAR HEMOGLOBIN CONC: 32.5 g/dL (ref 32.0–36.0)
MEAN CORPUSCULAR HEMOGLOBIN: 28.9 pg (ref 25.9–32.4)
MEAN CORPUSCULAR VOLUME: 89.1 fL (ref 77.6–95.7)
MEAN PLATELET VOLUME: 7.2 fL (ref 6.8–10.7)
MONOCYTES ABSOLUTE COUNT: 0.4 10*9/L (ref 0.3–0.8)
MONOCYTES RELATIVE PERCENT: 9.2 %
NEUTROPHILS ABSOLUTE COUNT: 2.5 10*9/L (ref 1.8–7.8)
NEUTROPHILS RELATIVE PERCENT: 56.7 %
PLATELET COUNT: 236 10*9/L (ref 150–450)
RED BLOOD CELL COUNT: 5.56 10*12/L (ref 4.26–5.60)
RED CELL DISTRIBUTION WIDTH: 13 % (ref 12.2–15.2)
WBC ADJUSTED: 4.5 10*9/L (ref 3.6–11.2)

## 2022-06-26 LAB — COMPREHENSIVE METABOLIC PANEL
ALBUMIN: 3.9 g/dL (ref 3.4–5.0)
ALKALINE PHOSPHATASE: 120 U/L (ref 38–126)
ALT (SGPT): 23 U/L (ref <50)
ANION GAP: 9 mmol/L (ref 7–15)
AST (SGOT): 40 U/L (ref 19–55)
BILIRUBIN TOTAL: 0.9 mg/dL (ref 0.1–1.2)
BLOOD UREA NITROGEN: 11 mg/dL (ref 7–21)
BUN / CREAT RATIO: 12
CALCIUM: 9.1 mg/dL (ref 8.5–10.2)
CHLORIDE: 109 mmol/L — ABNORMAL HIGH (ref 98–107)
CO2: 19 mmol/L — ABNORMAL LOW (ref 22.0–32.0)
CREATININE: 0.9 mg/dL (ref 0.70–1.30)
EGFR CKD-EPI (2021) MALE: >90 mL/min/{1.73_m2} (ref >=60)
GLUCOSE RANDOM: 107 mg/dL — ABNORMAL HIGH (ref 74–106)
POTASSIUM: 3.8 mmol/L (ref 3.5–5.0)
PROTEIN TOTAL: 7.3 g/dL (ref 6.5–8.3)
SODIUM: 137 mmol/L (ref 135–145)

## 2022-06-26 LAB — CBC
HEMATOCRIT: 50.1 % — ABNORMAL HIGH (ref 39.0–48.0)
HEMOGLOBIN: 16.3 g/dL (ref 12.9–16.5)
MEAN CORPUSCULAR HEMOGLOBIN CONC: 32.6 g/dL (ref 32.0–36.0)
MEAN CORPUSCULAR HEMOGLOBIN: 29.2 pg (ref 25.9–32.4)
MEAN CORPUSCULAR VOLUME: 89.5 fL (ref 77.6–95.7)
MEAN PLATELET VOLUME: 7.2 fL (ref 6.8–10.7)
PLATELET COUNT: 254 10*9/L (ref 150–450)
RED BLOOD CELL COUNT: 5.59 10*12/L (ref 4.26–5.60)
RED CELL DISTRIBUTION WIDTH: 12.9 % (ref 12.2–15.2)
WBC ADJUSTED: 5.2 10*9/L (ref 3.6–11.2)

## 2022-06-26 LAB — URINALYSIS WITH MICROSCOPY
BILIRUBIN UA: NEGATIVE
GLUCOSE UA: NEGATIVE
KETONES UA: NEGATIVE
LEUKOCYTE ESTERASE UA: NEGATIVE
NITRITE UA: POSITIVE — AB
PH UA: 7 (ref 5.0–7.0)
PROTEIN UA: NEGATIVE
RBC UA: 3 /HPF (ref 0–3)
SPECIFIC GRAVITY UA: 1.015 (ref 1.005–1.030)
SQUAMOUS EPITHELIAL: 0 /HPF (ref 0–5)
UROBILINOGEN UA: 4
WBC UA: 5 /HPF — ABNORMAL HIGH (ref 0–3)

## 2022-06-26 LAB — PROTIME-INR
INR: 1.02
INR: 1.06
INR: 0.96
PROTIME: 11.4 s (ref 9.9–12.6)
PROTIME: 11.8 s (ref 9.9–12.6)
PROTIME: 10.8 s (ref 9.9–12.6)

## 2022-06-26 LAB — TROPONIN I
TROPONIN I: <0.034 ng/mL (ref <0.034)
TROPONIN I: <0.034 ng/mL (ref <0.034)

## 2022-06-26 LAB — APTT
APTT: 37.4 s (ref 24.8–38.4)
APTT: 72.9 s — ABNORMAL HIGH (ref 24.8–38.4)
APTT: 38 s (ref 24.8–38.4)
HEPARIN CORRELATION: 0.2
HEPARIN CORRELATION: 0.4
HEPARIN CORRELATION: 0.2

## 2022-06-26 LAB — DRUG SCREEN, URINE
AMPHETAMINE SCREEN URINE: POSITIVE — AB
BARBITURATE SCREEN URINE: NEGATIVE
BENZODIAZEPINE SCREEN, URINE: NEGATIVE
CANNABINOID SCREEN URINE: NEGATIVE
COCAINE(METAB.)SCREEN, URINE: NEGATIVE
METHADONE SCREEN, URINE: NEGATIVE
OPIATE SCREEN URINE: NEGATIVE

## 2022-06-26 LAB — RED NO ADDITIVE EXTRA TUBE

## 2022-06-26 MED ADMIN — heparin 25,000 Units/250 mL (100 units/mL) in 0.45% saline infusion (premade): 18 [IU]/kg/h | INTRAVENOUS | @ 19:00:00 | Stop: 2022-06-26

## 2022-06-26 MED ADMIN — perflutren protein type-A microspheres (OPTISON) injection 3 mL: 3 mL | INTRAVENOUS | @ 21:00:00 | Stop: 2022-06-26

## 2022-06-26 MED ADMIN — iohexol (OMNIPAQUE) 350 mg iodine/mL solution 75 mL: 75 mL | INTRAVENOUS | @ 17:00:00 | Stop: 2022-06-26
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
## 2022-06-27 ENCOUNTER — Ambulatory Visit: Admit: 2022-06-27 | Discharge: 2022-07-04 | Disposition: A | Discharge status: Rehabilitation Facility | Payer: MEDICARE

## 2022-06-27 LAB — BASIC METABOLIC PANEL
ANION GAP: 5 mmol/L (ref 5–14)
BLOOD UREA NITROGEN: 9 mg/dL (ref 9–23)
BUN / CREAT RATIO: 10
CALCIUM: 8.6 mg/dL — ABNORMAL LOW (ref 8.7–10.4)
CHLORIDE: 110 mmol/L — ABNORMAL HIGH (ref 98–107)
CO2: 24 mmol/L (ref 20.0–31.0)
CREATININE: 0.9 mg/dL
EGFR CKD-EPI (2021) MALE: >90 mL/min/{1.73_m2} (ref >=60)
GLUCOSE RANDOM: 88 mg/dL (ref 70–179)
POTASSIUM: 3.7 mmol/L (ref 3.4–4.8)
SODIUM: 139 mmol/L (ref 135–145)

## 2022-06-27 LAB — APTT
APTT: 48.3 s — ABNORMAL HIGH (ref 24.8–38.4)
HEPARIN CORRELATION: 0.3

## 2022-06-27 LAB — HEMOGLOBIN A1C
ESTIMATED AVERAGE GLUCOSE: 85 mg/dL
HEMOGLOBIN A1C: 4.6 % — ABNORMAL LOW (ref 4.8–5.6)

## 2022-06-27 LAB — CBC
HEMATOCRIT: 47 % (ref 39.0–48.0)
HEMOGLOBIN: 15.7 g/dL (ref 12.9–16.5)
MEAN CORPUSCULAR HEMOGLOBIN CONC: 33.3 g/dL (ref 32.0–36.0)
MEAN CORPUSCULAR HEMOGLOBIN: 29.7 pg (ref 25.9–32.4)
MEAN CORPUSCULAR VOLUME: 89.2 fL (ref 77.6–95.7)
MEAN PLATELET VOLUME: 7.5 fL (ref 6.8–10.7)
PLATELET COUNT: 215 10*9/L (ref 150–450)
RED BLOOD CELL COUNT: 5.27 10*12/L (ref 4.26–5.60)
RED CELL DISTRIBUTION WIDTH: 13 % (ref 12.2–15.2)
WBC ADJUSTED: 3.9 10*9/L (ref 3.6–11.2)

## 2022-06-27 LAB — LIPID PANEL
CHOLESTEROL/HDL RATIO SCREEN: 4.6 — ABNORMAL HIGH (ref 1.0–4.5)
CHOLESTEROL: 165 mg/dL (ref <=200)
HDL CHOLESTEROL: 36 mg/dL — ABNORMAL LOW (ref 40–60)
LDL CHOLESTEROL CALCULATED: 83 mg/dL (ref 40–99)
NON-HDL CHOLESTEROL: 129 mg/dL (ref 70–130)
TRIGLYCERIDES: 230 mg/dL — ABNORMAL HIGH (ref 0–150)
VLDL CHOLESTEROL CAL: 46 mg/dL (ref 11–50)

## 2022-06-27 LAB — HIV RNA, QUANTITATIVE, PCR
HIV RNA LOG10: 4.07 {Log_copies}/mL — ABNORMAL HIGH (ref <0.00)
HIV RNA QNT RSLT: DETECTED — AB
HIV RNA: 11694 {copies}/mL — ABNORMAL HIGH (ref <0)

## 2022-06-27 LAB — HEPATITIS PANEL, ACUTE
HEPATITIS A IGM ANTIBODY: NONREACTIVE
HEPATITIS B CORE IGM ANTIBODY: NONREACTIVE
HEPATITIS B SURFACE ANTIGEN: NONREACTIVE
HEPATITIS C ANTIBODY: NONREACTIVE

## 2022-06-27 LAB — SYPHILIS SCREEN: SYPHILIS RPR SCREEN: NONREACTIVE

## 2022-06-27 MED ADMIN — vancomycin (VANCOCIN) 1750 mg in sodium chloride (NS) 0.9% 500 mL IVPB: 1750 mg | INTRAVENOUS | @ 07:00:00 | Stop: 2022-06-27

## 2022-06-27 MED ADMIN — penicillin G benzathine (BICILLIN-LA) injection 2.4 Million Units: 2.4 10*6.[IU] | INTRAMUSCULAR | @ 18:00:00 | Stop: 2022-06-27

## 2022-06-27 MED ADMIN — cefTRIAXone (ROCEPHIN) intramuscular injection 500 mg: 500 mg | INTRAMUSCULAR | @ 01:00:00 | Stop: 2022-06-26

## 2022-06-27 MED ADMIN — heparin 25,000 Units/250 mL (100 units/mL) in 0.45% saline infusion (premade): 13 [IU]/kg/h | INTRAVENOUS | @ 05:00:00

## 2022-06-27 MED ADMIN — cefTRIAXone (ROCEPHIN) 2 g in sodium chloride 0.9 % (NS) 100 mL IVPB-MBP: 2 g | INTRAVENOUS | @ 06:00:00 | Stop: 2022-06-27

## 2022-06-27 MED ADMIN — doxycycline (VIBRA-TABS) tablet 100 mg: 100 mg | ORAL | @ 18:00:00 | Stop: 2022-07-03

## 2022-06-27 MED ADMIN — doxycycline (VIBRA-TABS) tablet 100 mg: 100 mg | ORAL | @ 01:00:00 | Stop: 2022-06-26
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
## 2022-06-27 NOTE — Unmapped (Addendum)
SW received tobacco cessation consult; attempted to reach pt on cell twice (11:30am, 12:40pm) with no answer. No identification on voicemail message.    Third attempt to reach pt at 1:20pm. Calls continue to go to voicemail, no identifier on message. SW can attempt to reach pt again tomorrow if still in the ED; SW can attempt to reach pt on room phone if admitted.    Venancio Poisson, LCSW, NCTTP  Clinical Social Worker / Tobacco Treatment Specialist  [facility]  Phone: [phone]

## 2022-06-27 NOTE — Unmapped (Signed)
[facility] Behavioral Nursing Attempt    TYPE OF ENCOUNTER: Text     REASON: Follow Up    ADHERENCE: Not assessed at this time    ASSESSMENT: RN sent the following text message:    ZO:XWRU morning.Its Camaron Cammack. Let me know when I can give you a call    No response from the patient     PLAN: RN will continue to follow up    DURATION: 1 min
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
## 2022-06-27 NOTE — Unmapped (Signed)
[facility]  Ep Cardiac Device Service  [address]   Tel: [phone]  Fax: [phone]  Order:   MRI Lodema Pilot Contrast (Order [id number])  Imaging  Date: 06/27/2022 Department: [facility] Emergency Department Released By: Milinda Antis, MD (auto-released) Authorizing: [name], DO   Reason for Exam    Stroke       MRI Consult with Implanted CIED  Patient is approved for off-label MRI. Level 2 Moderate Risk.  Patient has a NON-MR Conditional CIED System. Epicardial RA and RV leads. Patient is pacing dependent.     Medtronic [id number] Azure??? XT DR MRI   DEVICE SERIAL NUMBER  [id number]  DEVICE TYPE  Pacemaker  DATE OF IMPLANT  20-Mar-2018    Lead 1  Lead Model 4968 CapSure?? Epicardial   MR Conditional No  Lead Position Atrial  Lead Length 60 cm  Manufacturer Medtronic  Serial Number [id number]  Implant Date 20-Mar-2018    Lead 2  Lead Model greatbatch  MR Conditional No  Lead Position RV  Lead Length  Manufacturer - - -  Serial Number [id number]  Implant Date 20-Mar-2018         LEAD INFORMATION  Abandoned/Epicardial Lead(s) Yes     Pacing Dependent: Yes      Prescreening Requirement  *The patient has no implanted lead extenders, lead adaptors, or abandoned leads-  *The patient has no broken leads or leads with intermittent electrical contact, as confirmed by lead impedance history >200 ohms and <1500 ohms   *Implanted for more than 6 weeks-unless EP Attending approval   *Device is operating within the projected service life-Cannot be done if EOS, ERI, near ERI without Ep Attending approval   *For patients whose device will be programmed to an asynchronous pacing mode when the MRI SureScan mode is programmed to On, no diaphragmatic stimulation is present when the paced leads have a pacing output of 5.0 V and a pulse width of 1.0 ms.  *Pacing capture thresholds ? 2.0 V at 0.4 ms-if dependent (pacing left on)        MR CONDITIONAL STATUS AND MANAGEMENT    MR CONDITIONAL SYSTEM (Including Generator and all leads)  Risks and Potential Complications discussed with patient and patient consents to procedure    MR NON-CONDITIONAL SYSTEM  Risks and Potential Complications discussed with patient and patient consents to procedure      Device will be programmed under the direction of the Ep Attending by the Ep Device RN

## 2022-06-27 NOTE — Unmapped (Signed)
Bed: 04-A  Expected date:   Expected time:   Means of arrival:   Comments:  transfer
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
## 2022-06-27 NOTE — Unmapped (Addendum)
Warfarin Therapeutic Monitoring Pharmacy Note    Warren [name] is a 47 y.o. male  re-starting  warfarin.     Indication: mechanical aortic valve replacement    Prior Dosing Information: Previous regimen warfarin 5 mg daily       Source(s) of information used to determine prior to admission dosing: Fill HIstory    Goals:  Therapeutic Drug Levels  INR range: 1.5-2 per cardiology    Additional Clinical Monitoring/Outcomes  Monitor hemoglobin and platelets  Monitor for signs and symptoms of bleeding  Monitor liver function (LFTs, bilirubin)    Results:  Lab Results   Component Value Date    INR 0.96 06/26/2022    INR 1.06 06/26/2022    INR 1.02 06/26/2022       Pharmacokinetic Considerations and Significant Drug Interactions:   Drug Interactions  see below table    Bridge Therapy  Heparin infusion (via nomogram)    Concurrent Antiplatelet Medications  not applicable    Assessment/Plan:  Recommendation(s)  INR is subtherapeutic.  Start warfarin 5 mg    Follow-up  INR to be obtained: daily with AM labs    A pharmacist will continue to monitor and recommend INRs/dose changes as appropriate    Longitudinal Dose Monitoring:  Date AM INR PM Dose (mg) Key Drug Interactions   6/5 -- 5 mg doxycycline   6/4 0.96 --- ---     A pharmacist will continue to monitor the INR daily and adjust the warfarin dose in conjunction with the medical team as appropriate. Please page service pharmacist with questions/clarifications.    Junie Bame, PharmD
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
## 2022-06-27 NOTE — Unmapped (Addendum)
[ ]   Indicates support system is his friend Trey Paula [phone]); spoke with MD team on the phone (would prefer a call from the patient's phone) and verified that the patient could stay with him and he could provide round the clock support at home    [ ]  connect with [facility] clinic for substance use cessation    [ ]  follow up with his cardiologist Dr. Andrey Farmer within 1 month of discharge    Regarding paranoid delusions:  - patient suffered bilateral frontal embolic strokes in early 2020 as a complication of aortic root replacement and mitral repair (difficult to determine from documented notes whether he retained any deficits from this such as disinhibition, apathy)  - known substance use including crack cocaine, marijuana, and most recently amphetamines   - first mention of c/f new psychosis is documented in Nov 01, 2021 notes following death of his sister and substance use - at that time, reported someone on TV speaking to him and a belief that others were watching him. PHQ-9 was documented as a 2 at the time. Has repeatedly declined psychiatric evaluation since  - despite paranoia, patient has no records of hospitalization or ED visits for psychiatric condition  - diagnosed with HIV in 2008 in [place]. Per notes from 2021, patient had a history of nonadherence to medications  - patient has multiple risk factors for stroke - would be interested to see the state of his brain with upcoming MRI as he may have drug-induced changes (at minimum)  - complicating med choices, if found necessary, pt has Qtc of . Further, pt has a history of med noncompliance and, given paranoia that centers around stigma, is probably less likely to be adherent to an antipsychotic    In sum, it wouldn't be surprising if he is paranoid - less likely that this is a primary thought disorder (age of development, no hospitalizations, multiple other factors that could be causing his psychosis) vice a psychosis induced either by substance abuse or medical condition (likely both). Still, there is concern that his paranoia is directly related to his current hospitalization and poor insight/judgment into taking life-saving medications. It may be prudent to evaluate his capacity and open a conversation about antipsychotics with him - to this end, I think it would be beneficial to have psych consulted for recs on medication initiation/management and, if he's open to it, follow-up once outpatient.    [ ]  Capacity: is patient's nonadherence to coumadin able to be logically explained and does he understand the consequences of not taking either that or his ART?  [ ]  does he continue to express paranoid thoughts during hospitalization?      Hx:    In February of 2020 he was hospitalized secondary to strep pneumo AV endocarditis, severe AI aortic root abscess, and mod-severe MR requiring aortic root replacement, complex reconstruction of aortomitral continuity, bAVR, MV repair and closure of R atrial fistula, placement of epicardial PPM (for complete heart block post operatively). This was complicated by embolic CVA.     In May 2021 he presented to [facility] with MSSA prosthetic aortic valve endocarditis. He was treated with IV antibiotics and ultimately underwent re-do aortic root replacement, and AVR with mechanical valve in June of 2021. Since that time he has been on coumadin for anticoagulation.
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
## 2022-06-28 LAB — CBC W/ AUTO DIFF
BASOPHILS ABSOLUTE COUNT: 0 10*9/L (ref 0.0–0.1)
BASOPHILS RELATIVE PERCENT: 0.8 %
EOSINOPHILS ABSOLUTE COUNT: 0.1 10*9/L (ref 0.0–0.5)
EOSINOPHILS RELATIVE PERCENT: 2.8 %
HEMATOCRIT: 49.4 % — ABNORMAL HIGH (ref 39.0–48.0)
HEMOGLOBIN: 16 g/dL (ref 12.9–16.5)
LYMPHOCYTES ABSOLUTE COUNT: 1.2 10*9/L (ref 1.1–3.6)
LYMPHOCYTES RELATIVE PERCENT: 31.8 %
MEAN CORPUSCULAR HEMOGLOBIN CONC: 32.3 g/dL (ref 32.0–36.0)
MEAN CORPUSCULAR HEMOGLOBIN: 29.2 pg (ref 25.9–32.4)
MEAN CORPUSCULAR VOLUME: 90.4 fL (ref 77.6–95.7)
MEAN PLATELET VOLUME: 7.9 fL (ref 6.8–10.7)
MONOCYTES ABSOLUTE COUNT: 0.5 10*9/L (ref 0.3–0.8)
MONOCYTES RELATIVE PERCENT: 14.1 %
NEUTROPHILS ABSOLUTE COUNT: 1.9 10*9/L (ref 1.8–7.8)
NEUTROPHILS RELATIVE PERCENT: 50.5 %
PLATELET COUNT: 231 10*9/L (ref 150–450)
RED BLOOD CELL COUNT: 5.47 10*12/L (ref 4.26–5.60)
RED CELL DISTRIBUTION WIDTH: 12.9 % (ref 12.2–15.2)
WBC ADJUSTED: 3.8 10*9/L (ref 3.6–11.2)

## 2022-06-28 LAB — APTT
APTT: 49.1 s — ABNORMAL HIGH (ref 24.8–38.4)
HEPARIN CORRELATION: 0.3

## 2022-06-28 LAB — LYMPH MARKER COMPLETE, FLOW
ABSOLUTE CD16/56 CNT: 182 {cells}/uL (ref 15–1080)
ABSOLUTE CD19 CNT: 252 {cells}/uL (ref 105–920)
ABSOLUTE CD3 CNT: 952 {cells}/uL (ref 915–3400)
ABSOLUTE CD4 CNT: 308 {cells}/uL — ABNORMAL LOW (ref 510–2320)
ABSOLUTE CD8 CNT: 616 {cells}/uL (ref 180–1520)
CD16/56%NK CELL: 13 % (ref 1–27)
CD19% (B CELLS): 18 % (ref 7–23)
CD3% (T CELLS): 68 % (ref 61–86)
CD4% (T HELPER): 22 % — ABNORMAL LOW (ref 34–58)
CD4:CD8 RATIO: 0.5 — ABNORMAL LOW (ref 0.9–4.8)
CD8% T SUPPRESR: 44 % — ABNORMAL HIGH (ref 12–38)

## 2022-06-28 LAB — CBC
HEMATOCRIT: 50 % — ABNORMAL HIGH (ref 39.0–48.0)
HEMOGLOBIN: 16.5 g/dL (ref 12.9–16.5)
MEAN CORPUSCULAR HEMOGLOBIN CONC: 32.9 g/dL (ref 32.0–36.0)
MEAN CORPUSCULAR HEMOGLOBIN: 29.5 pg (ref 25.9–32.4)
MEAN CORPUSCULAR VOLUME: 89.5 fL (ref 77.6–95.7)
MEAN PLATELET VOLUME: 7.2 fL (ref 6.8–10.7)
PLATELET COUNT: 207 10*9/L (ref 150–450)
RED BLOOD CELL COUNT: 5.58 10*12/L (ref 4.26–5.60)
RED CELL DISTRIBUTION WIDTH: 13.1 % (ref 12.2–15.2)
WBC ADJUSTED: 3.4 10*9/L — ABNORMAL LOW (ref 3.6–11.2)

## 2022-06-28 LAB — PROTIME-INR
INR: 0.97
PROTIME: 10.9 s (ref 9.9–12.6)

## 2022-06-28 LAB — SYPHILIS SCREEN: SYPHILIS RPR SCREEN: NONREACTIVE

## 2022-06-28 MED ADMIN — atorvastatin (LIPITOR) tablet 80 mg: 80 mg | ORAL | @ 13:00:00

## 2022-06-28 MED ADMIN — doxycycline (VIBRA-TABS) tablet 100 mg: 100 mg | ORAL | @ 10:00:00 | Stop: 2022-07-03

## 2022-06-28 MED ADMIN — metoPROLOL succinate (Toprol-XL) 24 hr tablet 50 mg: 50 mg | ORAL | @ 13:00:00

## 2022-06-28 MED ADMIN — doxycycline (VIBRA-TABS) tablet 100 mg: 100 mg | ORAL | Stop: 2022-07-03

## 2022-06-28 MED ADMIN — heparin 25,000 Units/250 mL (100 units/mL) in 0.45% saline infusion (premade): 13 [IU]/kg/h | INTRAVENOUS | @ 04:00:00

## 2022-06-28 MED ADMIN — warfarin (JANTOVEN) tablet 5 mg: 5 mg | ORAL | @ 22:00:00

## 2022-06-28 MED ADMIN — doxycycline (VIBRA-TABS) tablet 100 mg: 100 mg | ORAL | @ 22:00:00 | Stop: 2022-07-03

## 2022-06-28 MED ADMIN — warfarin (JANTOVEN) tablet 5 mg: 5 mg | ORAL
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
## 2022-06-28 NOTE — Unmapped (Signed)
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                             26 cm     Aortic Valve ---------------------------------------------------------------------- Name                                 Value        Normal ----------------------------------------------------------------------     AV Doppler ---------------------------------------------------------------------- AV Peak Velocity                   1.9 m/s               AV Peak Gradient                   14 mmHg               AV Mean Gradient                   10 mmHg               AV VTI                               36 cm               AV DI (Vel)                           0.82     Tricuspid Valve ---------------------------------------------------------------------- Name  Value        Normal ----------------------------------------------------------------------     Estimated PAP/RSVP ---------------------------------------------------------------------- RA Pressure                         3 mmHg           <=5     Pulmonic Valve ---------------------------------------------------------------------- Name                                 Value        Normal ----------------------------------------------------------------------     PV Doppler ---------------------------------------------------------------------- PV Peak Velocity                   0.7 m/s     Aorta ---------------------------------------------------------------------- Name Value        Normal ----------------------------------------------------------------------     Ascending Aorta ---------------------------------------------------------------------- Ao Root Diameter (2D)               3.9 cm               Ao Root Diam Index (2D)          1.9 cm/m2         Report Signatures Finalized by Lorretta Harp  MD on 06/26/2022 05:38 PM    CTA Head W Contrast    Result Date: 06/26/2022  EXAM: Computed tomographic angiography, head, with contrast material and image postprocessing. DATE: 06/26/2022 1:22 PM ACCESSION: [id number] [facility] DICTATED: 06/26/2022 1:26 PM INTERPRETATION LOCATION: [facility]     CLINICAL INDICATION: 47 years old Male with acute stroke      COMPARISON: Same day CT head.     TECHNIQUE: CT angiogram of the head during contrast bolus infusion were acquired.  Reformatted sagittal, coronal MIP images are provided. 3D volume rendered images using a stand alone workstation are also provided.     FINDINGS: Partially occlusive thrombus of the left distal M1 segment of the MCA (10:112), vessels distal to the occlusion appear to be patent. No intracranial hemorrhage or mass. The osseous structures are unremarkable.     The remaining major arterial structures are normal in appearance. No definite stenosis. No aneurysm visualized.          Partially occlusive thrombus of the left distal M1 segment.     ++++++++++++++++++++     The findings of this study were discussed via telephone with DR. RYAN MICHAEL PAULUS by Dr. Barbaraann Share on 06/26/2022 1:34 PM.     -----------------------------------------------    CTA Neck W Contrast    Result Date: 06/26/2022  EXAM: Computed tomographic angiography, neck, with contrast material, including noncontrast images, if performed, and image postprocessing on a stand alone workstation. DATE: 06/26/2022 1:22 PM ACCESSION: [id number] [facility] DICTATED: 06/26/2022 1:26 PM INTERPRETATION LOCATION: [facility]     CLINICAL INDICATION: 47 years old Male with acute stroke      COMPARISON: Concurrent CTA head     TECHNIQUE: Contiguous axial CT angiogram of the neck during contrast bolus infusion were acquired.  Multiplanar reformatted and MIP images were provided. For selected cases, 3D volume rendered images are also provided.     FINDINGS: No evidence of dissection or aneurysm in the carotid or vertebral arteries. No hemodynamically significant arterial stenosis.     The soft tissues are unremarkable without lymphadenopathy, mass or abscess. Mild degenerative changes of the cervical spine. Otherwise, the visualized osseous structures are unremarkable. The airway  is patent and midline. The lung apices are unremarkable.     In this report, if stenosis of the internal carotid artery is reported, it is calculated based on the NASCET method: (1 - (minimal residual diameter/normal diameter of distal ICA)).         No evidence of high-grade stenosis, aneurysm or dissection of the carotid or vertebral arteries.     Please refer to the separately dictated CTA head report for evaluation of major intracranial arterial structures         CT Head Wo Contrast    Result Date: 06/26/2022  EXAM: Computed tomography, head or brain without contrast material. DATE: 06/26/2022 12:31 PM ACCESSION: [id number] [facility] DICTATED: 06/26/2022 12:49 PM INTERPRETATION LOCATION: [facility]     CLINICAL INDICATION: 47 years old Male with Stroke Workup      COMPARISON: None     TECHNIQUE: Axial CT images of the head  from skull base to vertex without contrast.     FINDINGS: Faint hypoattenuation involving the left insula, caudate head, putamen and corona radiata, concerning for acute infarction (series 2, images 11-16). There is no midline shift. No mass lesion. No acute intracranial hemorrhage. No fractures are evident. The sinuses are pneumatized.         Acute infarction in the left MCA territory, including basal ganglia and insula.     No acute intracranial hemorrhage     ++++++++++++++++++++ The findings of this study were discussed via EPIC Messages with Assunta Gambles, PA by Dr. Velva Harman on 06/26/2022 12:52 PM.     -----------------------------------------------          For coding purposes:   - This patient was seen by the provider Darrick Grinder, MD).  - This encounter should be coded as a an inpatient consultation.    I Darrick Grinder, MD) personally spent 75 minutes face-to-face and non-face-to-face in the care of this patient, which includes all pre, intra, and post visit time on the date of service. All documented time was specific to the E/M visit and does not include any procedures that may have been performed. Care time for the patient included face-to-face time with the patient, reviewing the patient's chart, communicating with the family and/or other professionals and coordinating care.    MEDICAL DECISION MAKING (level of service defined by 2/3 elements)     Number/Complexity of Problems Addressed 1 acute or chronic illness or injury that poses a threat to life or bodily function   Amount/Complexity of Data to be Reviewed/Analyzed Independent interpretation of a test performed by another physician/other qualified health care professional  3 or more points: Review prior notes (1 point per unique source); Review test results (1 point per unique test); Order tests (1 point per unique test); Assessment requiring an independent historian (1 point)  Discussion of management or test interpretation with external physician/other qualified health care professional/appropriate source   Risk of Complications/Morbidity/Mortality of Management HIGH Risk of Morbidity

## 2022-06-28 NOTE — Unmapped (Addendum)
Attempted to reach pt to offer tobacco cessation counseling, no answer on cell. SW attempted to reach pt multiple times yesterday (see consult note by this writer dated 6/5).    1:28pm--last attempt to reach pt, no answer.    Venancio Poisson, LCSW, NCTTP  Clinical Social Worker / Tobacco Treatment Specialist  [facility]  Phone: [phone]
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
## 2022-06-29 LAB — CBC
HEMATOCRIT: 48.7 % — ABNORMAL HIGH (ref 39.0–48.0)
HEMOGLOBIN: 16 g/dL (ref 12.9–16.5)
MEAN CORPUSCULAR HEMOGLOBIN CONC: 32.8 g/dL (ref 32.0–36.0)
MEAN CORPUSCULAR HEMOGLOBIN: 29.4 pg (ref 25.9–32.4)
MEAN CORPUSCULAR VOLUME: 89.7 fL (ref 77.6–95.7)
MEAN PLATELET VOLUME: 7.4 fL (ref 6.8–10.7)
PLATELET COUNT: 217 10*9/L (ref 150–450)
RED BLOOD CELL COUNT: 5.42 10*12/L (ref 4.26–5.60)
RED CELL DISTRIBUTION WIDTH: 12.8 % (ref 12.2–15.2)
WBC ADJUSTED: 3.9 10*9/L (ref 3.6–11.2)

## 2022-06-29 LAB — BASIC METABOLIC PANEL
ANION GAP: 6 mmol/L (ref 5–14)
BLOOD UREA NITROGEN: 10 mg/dL (ref 9–23)
BUN / CREAT RATIO: 11
CALCIUM: 9.1 mg/dL (ref 8.7–10.4)
CHLORIDE: 107 mmol/L (ref 98–107)
CO2: 23 mmol/L (ref 20.0–31.0)
CREATININE: 0.88 mg/dL
EGFR CKD-EPI (2021) MALE: >90 mL/min/{1.73_m2} (ref >=60)
GLUCOSE RANDOM: 97 mg/dL (ref 70–179)
POTASSIUM: 4.1 mmol/L (ref 3.4–4.8)
SODIUM: 136 mmol/L (ref 135–145)

## 2022-06-29 LAB — APTT
APTT: 63.2 s — ABNORMAL HIGH (ref 24.8–38.4)
HEPARIN CORRELATION: 0.4

## 2022-06-29 LAB — PROTIME-INR
INR: 1.01
PROTIME: 11.3 s (ref 9.9–12.6)

## 2022-06-29 MED ADMIN — aspirin chewable tablet 81 mg: 81 mg | ORAL | @ 21:00:00

## 2022-06-29 MED ADMIN — doxycycline (VIBRA-TABS) tablet 100 mg: 100 mg | ORAL | @ 23:00:00 | Stop: 2022-07-03

## 2022-06-29 MED ADMIN — atorvastatin (LIPITOR) tablet 80 mg: 80 mg | ORAL | @ 14:00:00

## 2022-06-29 MED ADMIN — warfarin (JANTOVEN) tablet 5 mg: 5 mg | ORAL | @ 23:00:00

## 2022-06-29 MED ADMIN — heparin 25,000 Units/250 mL (100 units/mL) in 0.45% saline infusion (premade): 13 [IU]/kg/h | INTRAVENOUS | @ 05:00:00 | Stop: 2022-06-29

## 2022-06-29 MED ADMIN — doxycycline (VIBRA-TABS) tablet 100 mg: 100 mg | ORAL | @ 11:00:00 | Stop: 2022-07-03

## 2022-06-29 MED ADMIN — metoPROLOL succinate (Toprol-XL) 24 hr tablet 50 mg: 50 mg | ORAL | @ 14:00:00

## 2022-06-29 NOTE — Unmapped (Signed)
No acute events overnight. Vitals stable. A&Ox4. No complaints of pain. Heparin gtt continued, in therapeutic range. Voids in urinal. Repositions self. No further needs noted at this time.  Problem: Adult Inpatient Plan of Care  Goal: Plan of Care Review  Outcome: Progressing  Goal: Patient-Specific Goal (Individualized)  Outcome: Progressing  Goal: Absence of Hospital-Acquired Illness or Injury  Outcome: Progressing  Intervention: Identify and Manage Fall Risk  Recent Flowsheet Documentation  Taken 06/28/2022 2000 by Barbette Reichmann, RN  Safety Interventions:   bed alarm   fall reduction program maintained   low bed  Intervention: Prevent and Manage VTE (Venous Thromboembolism) Risk  Recent Flowsheet Documentation  Taken 06/29/2022 0600 by Barbette Reichmann, RN  Anti-Embolism Device Type: SCD, Knee  Anti-Embolism Intervention: (none ordered) Other (Comment)  Anti-Embolism Device Location: BLE  Taken 06/29/2022 0400 by Barbette Reichmann, RN  Anti-Embolism Device Type: SCD, Knee  Anti-Embolism Intervention: (none ordered) Other (Comment)  Anti-Embolism Device Location: BLE  Taken 06/29/2022 0200 by Barbette Reichmann, RN  Anti-Embolism Device Type: SCD, Knee  Anti-Embolism Intervention: (none ordered) Other (Comment)  Anti-Embolism Device Location: BLE  Taken 06/29/2022 0000 by Barbette Reichmann, RN  Anti-Embolism Device Type: SCD, Knee  Anti-Embolism Intervention: (none ordered) Other (Comment)  Anti-Embolism Device Location: BLE  Taken 06/28/2022 2200 by Barbette Reichmann, RN  Anti-Embolism Device Type: SCD, Knee  Anti-Embolism Intervention: (none ordered) Other (Comment)  Anti-Embolism Device Location: BLE  Taken 06/28/2022 2000 by Barbette Reichmann, RN  Anti-Embolism Device Type: SCD, Knee  Anti-Embolism Intervention: (none ordered) Other (Comment)  Anti-Embolism Device Location: BLE  Goal: Optimal Comfort and Wellbeing  Outcome: Progressing  Goal: Readiness for Transition of Care  Outcome: Progressing  Goal: Rounds/Family Conference  Outcome: Progressing     Problem: Stroke, Ischemic (Includes Transient Ischemic Attack)  Goal: Optimal Coping  Outcome: Progressing  Goal: Effective Bowel Elimination  Outcome: Progressing  Goal: Optimal Cerebral Tissue Perfusion  Outcome: Progressing  Goal: Optimal Cognitive Function  Outcome: Progressing  Goal: Improved Communication Skills  Outcome: Progressing  Goal: Optimal Functional Ability  Outcome: Progressing  Goal: Optimal Nutrition Intake  Outcome: Progressing  Goal: Effective Oxygenation and Ventilation  Outcome: Progressing  Intervention: Optimize Oxygenation and Ventilation  Recent Flowsheet Documentation  Taken 06/28/2022 2000 by Barbette Reichmann, RN  [facility] of [facility]) Positioning: HOB at 30 degrees  Goal: Improved Sensorimotor Function  Outcome: Progressing  Goal: Safe and Effective Swallow  Outcome: Progressing  Goal: Effective Urinary Elimination  Outcome: Progressing     Problem: Fall Injury Risk  Goal: Absence of Fall and Fall-Related Injury  Outcome: Progressing  Intervention: Promote Injury-Free Environment  Recent Flowsheet Documentation  Taken 06/28/2022 2000 by Barbette Reichmann, RN  Safety Interventions:   bed alarm   fall reduction program maintained   low bed     Problem: Self-Care Deficit  Goal: Improved Ability to Complete Activities of Daily Living  Outcome: Progressing

## 2022-06-29 NOTE — Unmapped (Signed)
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 ADULT, ADJUVANTED, IM(HEPLISAV B) 10/13/2021, 03/07/2022    Hepatitis A (Adult) 03/07/2022    Influenza Vaccine Quad(IM)6 MO-Adult(PF) 10/13/2021, 10/13/2021    Influenza Virus Vaccine, unspecified formulation 10/13/2021    Pneumococcal Conjugate 20-valent 08/25/2020         Meds and Allergies  Patient has a current medication list which includes the following prescription(s): atorvastatin, biktarvy, enoxaparin, nicotine, nicotine polacrilex, and warfarin, and the following Facility-Administered Medications: atorvastatin, doxycycline, heparin (porcine), heparin (porcine), hepatitis a virus vaccine (pf), labetalol, metoprolol succinate,  nicotine, and warfarin **AND** Inpatient consult to Pharmacy RX to dose: Warfarin.    Allergies: Patient has no known allergies.       Social History  Patient  reports that he has been smoking cigarettes. He has a 7 pack-year smoking history. He has never used smokeless tobacco. He reports current alcohol use of about 10.0 standard drinks of alcohol per week. He reports current drug use. Drugs: Marijuana and Cocaine.
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
## 2022-06-29 NOTE — Unmapped (Addendum)
Warfarin Therapeutic Monitoring Pharmacy Note    Warren [name] is a 47 y.o. male  re-starting  warfarin.     Indication: mechanical aortic valve replacement (On-X valve type)    Prior Dosing Information: Previous regimen warfarin 5 mg daily       Source(s) of information used to determine prior to admission dosing: Fill HIstory    Goals:  Therapeutic Drug Levels  INR range: 1.5-2 per cardiology    Additional Clinical Monitoring/Outcomes  Monitor hemoglobin and platelets  Monitor for signs and symptoms of bleeding  Monitor liver function (LFTs, bilirubin)    Results:  Lab Results   Component Value Date    INR 1.01 06/29/2022    INR 0.97 06/28/2022    INR 0.96 06/26/2022       Pharmacokinetic Considerations and Significant Drug Interactions:   Drug Interactions  see below table    Bridge Therapy  Enoxaparin    Concurrent Antiplatelet Medications  not applicable    Assessment/Plan:  Recommendation(s)  INR is subtherapeutic as expected  Continue current regimen of warfarin 5 mg daily  Anticipate INR may reflect changes to warfarin dosing ~3-5 days after initiation/dose change. Given lower goal of 1.5-2 and doxycycline (can increase INR), will continue current regimen.  Continue bridge with enoxaparin 1 mg/kg Q12H until INR within goal x 2 consecutive INR checks    Follow-up  INR to be obtained: daily with AM labs    A pharmacist will continue to monitor and recommend INRs/dose changes as appropriate    Longitudinal Dose Monitoring:  Date AM INR PM Dose (mg) Key Drug Interactions   6/7 1.01 5 mg doxycycline   6/6 0.97 5 mg doxycycline   6/5 -- 5 mg Doxycycline, IM Penicillin G   6/4 0.96 --- ---     A pharmacist will continue to monitor the INR daily and adjust the warfarin dose in conjunction with the medical team as appropriate. Please page service pharmacist with questions/clarifications.    Junie Bame, PharmD, BCPS, [facility]  Neurology Clinical Pharmacist  NMA: [phone], NMB: [phone]
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
## 2022-06-30 LAB — CBC
HEMATOCRIT: 50.1 % — ABNORMAL HIGH (ref 39.0–48.0)
HEMOGLOBIN: 16.4 g/dL (ref 12.9–16.5)
MEAN CORPUSCULAR HEMOGLOBIN CONC: 32.8 g/dL (ref 32.0–36.0)
MEAN CORPUSCULAR HEMOGLOBIN: 29.3 pg (ref 25.9–32.4)
MEAN CORPUSCULAR VOLUME: 89.5 fL (ref 77.6–95.7)
MEAN PLATELET VOLUME: 7.4 fL (ref 6.8–10.7)
PLATELET COUNT: 228 10*9/L (ref 150–450)
RED BLOOD CELL COUNT: 5.6 10*12/L (ref 4.26–5.60)
RED CELL DISTRIBUTION WIDTH: 12.8 % (ref 12.2–15.2)
WBC ADJUSTED: 3.8 10*9/L (ref 3.6–11.2)

## 2022-06-30 LAB — PHOSPHORUS: PHOSPHORUS: 3.2 mg/dL (ref 2.4–5.1)

## 2022-06-30 LAB — BASIC METABOLIC PANEL
ANION GAP: 5 mmol/L (ref 5–14)
BLOOD UREA NITROGEN: 12 mg/dL (ref 9–23)
BUN / CREAT RATIO: 12
CALCIUM: 9.4 mg/dL (ref 8.7–10.4)
CHLORIDE: 106 mmol/L (ref 98–107)
CO2: 26 mmol/L (ref 20.0–31.0)
CREATININE: 1.04 mg/dL
EGFR CKD-EPI (2021) MALE: >90 mL/min/{1.73_m2} (ref >=60)
GLUCOSE RANDOM: 93 mg/dL (ref 70–179)
POTASSIUM: 4.7 mmol/L (ref 3.4–4.8)
SODIUM: 137 mmol/L (ref 135–145)

## 2022-06-30 LAB — MAGNESIUM: MAGNESIUM: 1.8 mg/dL (ref 1.6–2.6)

## 2022-06-30 LAB — PROTIME-INR
INR: 1.08
PROTIME: 12.1 s (ref 9.9–12.6)

## 2022-06-30 LAB — APTT
APTT: 46.8 s — ABNORMAL HIGH (ref 24.8–38.4)
HEPARIN CORRELATION: 0.3

## 2022-06-30 MED ADMIN — aspirin chewable tablet 81 mg: 81 mg | ORAL | @ 14:00:00

## 2022-06-30 MED ADMIN — doxycycline (VIBRA-TABS) tablet 100 mg: 100 mg | ORAL | @ 11:00:00 | Stop: 2022-07-03

## 2022-06-30 MED ADMIN — enoxaparin (LOVENOX) syringe 90 mg: 1 mg/kg | SUBCUTANEOUS | @ 02:00:00

## 2022-06-30 MED ADMIN — warfarin (JANTOVEN) tablet 5 mg: 5 mg | ORAL | @ 23:00:00

## 2022-06-30 MED ADMIN — metoPROLOL succinate (Toprol-XL) 24 hr tablet 50 mg: 50 mg | ORAL | @ 14:00:00

## 2022-06-30 MED ADMIN — atorvastatin (LIPITOR) tablet 80 mg: 80 mg | ORAL | @ 14:00:00

## 2022-06-30 MED ADMIN — doxycycline (VIBRA-TABS) tablet 100 mg: 100 mg | ORAL | @ 23:00:00 | Stop: 2022-07-03

## 2022-06-30 MED ADMIN — enoxaparin (LOVENOX) syringe 90 mg: 1 mg/kg | SUBCUTANEOUS | @ 14:00:00

## 2022-06-30 NOTE — Unmapped (Signed)
Warfarin Therapeutic Monitoring Pharmacy Note    Warren [name] is a 47 y.o. male  re-starting  warfarin.     Indication: mechanical aortic valve replacement (On-X valve type)    Prior Dosing Information: Previous regimen warfarin 5 mg daily       Source(s) of information used to determine prior to admission dosing: Fill HIstory    Goals:  Therapeutic Drug Levels  INR range: 1.5-2 per cardiology    Additional Clinical Monitoring/Outcomes  Monitor hemoglobin and platelets  Monitor for signs and symptoms of bleeding  Monitor liver function (LFTs, bilirubin)    Results:  Lab Results   Component Value Date    INR 1.08 06/30/2022    INR 1.01 06/29/2022    INR 0.97 06/28/2022       Pharmacokinetic Considerations and Significant Drug Interactions:   Drug Interactions  see below table    Bridge Therapy  Enoxaparin    Concurrent Antiplatelet Medications  none identified    Assessment/Plan:  Recommendation(s)  INR is subtherapeutic   Continue current regimen of warfarin 5 mg daily  Anticipate INR may reflect changes to warfarin dosing ~3-5 days after initiation/dose change. Given lower goal of 1.5-2 and doxycycline (can increase INR), will continue current regimen.  Continue bridge with enoxaparin 1 mg/kg Q12H until INR within goal x 2 consecutive INR checks    Follow-up  INR to be obtained: daily with AM labs    A pharmacist will continue to monitor and recommend INRs/dose changes as appropriate    Longitudinal Dose Monitoring:  Date AM INR PM Dose (mg) Key Drug Interactions   6/8 1.08 5 mg Doxycycline   6/7 1.01 5 mg Doxycycline   6/6 0.97 5 mg Doxycycline   6/5 -- 5 mg Doxycycline, IM Penicillin G   6/4 0.96 --- ---     A pharmacist will continue to monitor the INR daily and adjust the warfarin dose in conjunction with the medical team as appropriate. Please page service pharmacist with questions/clarifications.      Bertell Maria, PharmD, MBA, BCPS  Clinical Pharmacy Specialist, Benign Hematology - Factor Stewardship

## 2022-06-30 NOTE — Unmapped (Signed)
Oriented x4. No acute neuro changes during the shift. Vss. Heparin gtt stopped per order. Voiding without difficulty. Safety round performed. wctm  Problem: Adult Inpatient Plan of Care  Goal: Plan of Care Review  Flowsheets (Taken 06/30/2022 0445)  Progress: no change  Plan of Care Reviewed With: patient  Goal: Patient-Specific Goal (Individualized)  Flowsheets (Taken 06/30/2022 0445)  Patient/Family-Specific Goals (Include Timeframe): pt will be injury free during this hospitalization  Goal: Absence of Hospital-Acquired Illness or Injury  Intervention: Identify and Manage Fall Risk  Recent Flowsheet Documentation  Taken 06/29/2022 2000 by Estrella Myrtle, RN  Safety Interventions:   bed alarm   low bed   lighting adjusted for tasks/safety  Intervention: Prevent and Manage VTE (Venous Thromboembolism) Risk  Recent Flowsheet Documentation  Taken 06/30/2022 0400 by Estrella Myrtle, RN  Anti-Embolism Intervention: (none ordered) Other (Comment)  Taken 06/30/2022 0200 by Estrella Myrtle, RN  Anti-Embolism Intervention: (none ordered) Other (Comment)  Taken 06/30/2022 0000 by Estrella Myrtle, RN  Anti-Embolism Intervention: (none ordered) Other (Comment)  Taken 06/29/2022 2200 by Estrella Myrtle, RN  Anti-Embolism Intervention: (none ordered) Other (Comment)  Taken 06/29/2022 2000 by Estrella Myrtle, RN  Anti-Embolism Intervention: (none ordered) Other (Comment)     Problem: Fall Injury Risk  Goal: Absence of Fall and Fall-Related Injury  Intervention: Promote Injury-Free Environment  Recent Flowsheet Documentation  Taken 06/29/2022 2000 by Estrella Myrtle, RN  Safety Interventions:   bed alarm   low bed   lighting adjusted for tasks/safety

## 2022-06-30 NOTE — Unmapped (Signed)
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 - surgery in [place] @ [facility]       Immunization History   Administered Date(s) Administered    COVID-19 VAC,MRNA,TRIS(12Y UP)(PFIZER)(GRAY CAP) 08/13/2019    COVID-19 VACC,MRNA(BOOSTER)OR(6-58YR)MODERNA 08/25/2020  COVID-19 VACC,MRNA,(PFIZER)(PF) 08/13/2019, 11/03/2019, 04/29/2020    Covid-19 Vac, (50yr+) (Spikevax) Monovalent Xbb.1.5 Moder  11/03/2021    HEPATITIS B VACCINE ADULT, ADJUVANTED, IM(HEPLISAV B) 10/13/2021, 03/07/2022    Hepatitis A (Adult) 03/07/2022    Influenza Vaccine Quad(IM)6 MO-Adult(PF) 10/13/2021, 10/13/2021    Influenza Virus Vaccine, unspecified formulation 10/13/2021    Pneumococcal Conjugate 20-valent 08/25/2020         Meds and Allergies  Patient has a current medication list which includes the following prescription(s): atorvastatin, biktarvy, enoxaparin, nicotine, nicotine polacrilex, and warfarin, and the following Facility-Administered Medications: aspirin, atorvastatin, doxycycline, enoxaparin, hepatitis a virus vaccine (pf), labetalol, metoprolol succinate, nicotine, and warfarin **AND** Inpatient consult to Pharmacy RX to dose: Warfarin.    Allergies: Patient has no known allergies.       Social History  Patient  reports that he has been smoking cigarettes. He has a 7 pack-year smoking history. He has never used smokeless tobacco. He reports current alcohol use of about 10.0 standard drinks of alcohol per week. He reports current drug use. Drugs: Marijuana and Cocaine.

## 2022-06-30 NOTE — Unmapped (Signed)
Patient throughout shift was Aox4 with improvement in neuro changes. Patient is able to move the RUE & RLE with assistance of LUE & LLE. Vital signs stable and no reports of pain. No PRN's given during shift. Diet is tolerated and intake is adequate. No falls or skin changes to report during shift. Reviewed safety precautions with patient.
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
## 2022-07-01 LAB — APTT
APTT: 56.9 s — ABNORMAL HIGH (ref 24.8–38.4)
HEPARIN CORRELATION: 0.3

## 2022-07-01 LAB — PROTIME-INR
INR: 1.43
PROTIME: 15.9 s — ABNORMAL HIGH (ref 9.9–12.6)

## 2022-07-01 MED ADMIN — atorvastatin (LIPITOR) tablet 80 mg: 80 mg | ORAL | @ 13:00:00

## 2022-07-01 MED ADMIN — metoPROLOL succinate (Toprol-XL) 24 hr tablet 50 mg: 50 mg | ORAL | @ 13:00:00

## 2022-07-01 MED ADMIN — aspirin chewable tablet 81 mg: 81 mg | ORAL | @ 13:00:00

## 2022-07-01 MED ADMIN — warfarin (JANTOVEN) tablet 5 mg: 5 mg | ORAL | @ 22:00:00

## 2022-07-01 MED ADMIN — doxycycline (VIBRA-TABS) tablet 100 mg: 100 mg | ORAL | @ 10:00:00 | Stop: 2022-07-03

## 2022-07-01 MED ADMIN — enoxaparin (LOVENOX) syringe 90 mg: 1 mg/kg | SUBCUTANEOUS | @ 13:00:00

## 2022-07-01 MED ADMIN — doxycycline (VIBRA-TABS) tablet 100 mg: 100 mg | ORAL | @ 22:00:00 | Stop: 2022-07-03

## 2022-07-01 MED ADMIN — enoxaparin (LOVENOX) syringe 90 mg: 1 mg/kg | SUBCUTANEOUS | @ 01:00:00

## 2022-07-01 NOTE — Unmapped (Signed)
Problem: Adult Inpatient Plan of Care  Goal: Absence of Hospital-Acquired Illness or Injury  Outcome: Progressing  Goal: Optimal Comfort and Wellbeing  Outcome: Progressing     Problem: Stroke, Ischemic (Includes Transient Ischemic Attack)  Goal: Effective Oxygenation and Ventilation  Outcome: Progressing     Problem: Fall Injury Risk  Goal: Absence of Fall and Fall-Related Injury  Outcome: Progressing

## 2022-07-01 NOTE — Unmapped (Signed)
Warfarin Therapeutic Monitoring Pharmacy Note    Warren [name] is a 47 y.o. male  re-starting  warfarin.     Indication: mechanical aortic valve replacement (On-X valve type)    Prior Dosing Information: Previous regimen warfarin 5 mg daily       Source(s) of information used to determine prior to admission dosing: Fill HIstory    Goals:  Therapeutic Drug Levels  INR range: 1.5-2 per cardiology    Additional Clinical Monitoring/Outcomes  Monitor hemoglobin and platelets  Monitor for signs and symptoms of bleeding  Monitor liver function (LFTs, bilirubin)    Results:  Lab Results   Component Value Date    INR 1.43 07/01/2022    INR 1.08 06/30/2022    INR 1.01 06/29/2022       Pharmacokinetic Considerations and Significant Drug Interactions:   Drug Interactions  see below table    Bridge Therapy  Enoxaparin    Concurrent Antiplatelet Medications  none identified    Assessment/Plan:  Recommendation(s)  INR is subtherapeutic   Continue current regimen of warfarin 5 mg daily  Anticipate INR may reflect changes to warfarin dosing ~3-5 days after initiation/dose change. Given lower goal of 1.5-2 and doxycycline (can increase INR), will continue current regimen.  Continue bridge with enoxaparin 1 mg/kg Q12H until INR within goal x 2 consecutive INR checks    Follow-up  INR to be obtained: daily with AM labs    A pharmacist will continue to monitor and recommend INRs/dose changes as appropriate    Longitudinal Dose Monitoring:  Date AM INR PM Dose (mg) Key Drug Interactions   6/9 1.43 5 mg Doxycycline   6/8 1.08 5 mg Doxycycline   6/7 1.01 5 mg Doxycycline   6/6 0.97 5 mg Doxycycline   6/5 -- 5 mg Doxycycline, IM Penicillin G   6/4 0.96 --- ---     A pharmacist will continue to monitor the INR daily and adjust the warfarin dose in conjunction with the medical team as appropriate. Please page service pharmacist with questions/clarifications.      Bertell Maria, PharmD, MBA, BCPS  Clinical Pharmacy Specialist, Benign Hematology - [facility]

## 2022-07-01 NOTE — Unmapped (Signed)
[facility]  Follow-Up Psychiatry Consult Note      Date of admission: 06/26/2022  9:41 PM  Service Date: July 01, 2022  Primary Team: Neurology (NEU)  LOS:  LOS: 5 days      Assessment:   Warren [name]. is a 47 y.o. 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  Patient was seen in consultation by request of No att. providers found for evaluation of psychosis.     On initial evaluation, Warren [name]. presented with symptoms consistent with a diagnosis of unspecified psychosis with brief description of idea of reference (screens talking to him), paranoia, and thought disorganization. His thought process was disorganized with loss of goal and latency notable in speech. He required frequent reorientation to questions and is visibly distracted, frequently looking to the wall or into corners. Affect is flat and labile, concerning for pseudobulbar affect (suddenly started crying when asked about support system) from stroke or other neurological process. He endorses subjective confusion. Inattentive when listing DOWB and oriented only to name. His alteration of cognition and attention may be best accounted for by post-stroke delirium. Advanced HIV and chronic substance use increase likelihood of poor cognitive reserve and create vulnerability for delirium.      Notably, his psychosis onset prior approximately 1 year ago. The differential in the setting of advanced HIV and polysubstance use is broad and includes HIV-related neurocognitive disorder and substance induced psychosis. He has an unknown family history and primary thought disorder is on the differential, but less likely to present at age 17. Per collateral from outpatient internal medicine, he was never known to be psychotic, but last seen greater than one year ago. Will continue to assess with serial mental status exams and continue to seek personal collateral (spoke with ID FNP and nurse CM today, who confirmed lack of known history of psychosis or drug use prior to the last year).     On assessment 6/9, patient did not present with signs of psychosis and thought process was notably logical and linear.  He was alert and oriented (except to year) with no sign of paranoid ideation or delusional thought content.  He was able to successfully state the days of the week backwards.  Given his improvement, will defer starting antipsychotic at this time. Delirium appears to be improved.    Diagnoses:   Active Hospital problems:  Principal Problem:    CVA (cerebral vascular accident) (CMS-HCC)  Active Problems:    History of prosthetic aortic valve    HIV (human immunodeficiency virus infection) (CMS-HCC)    Tobacco use disorder    HLD (hyperlipidemia)    Hx of repair of aortic root       Problems edited/added by me:  No problems updated.    Risk Assessment:  ASQ screening result: low risk    -Unable to complete a full safety assessment at this time due to delirium.     Current suicide risk: unable to be determined  Current homicide risk: unable to be determined    Recommendations:     Safety and Observation Level:   -- This  patient DOES meet involuntary commitment (IVC) criteria given presence of mental illness and evidence of acute dangerousness to self (probability of suffering serious physical debilitation within the near future unless adequate treatment is given). Although patient meets criteria for IVC at this time, we are not filing IVC paperwork because they are willing to remain voluntarily in the hospital. At this point it is not psychiatrically safe for this patient to leave the hospital. If the patient attempts to leave AMA, call a behavioral response and page our team [phone] so we can perform a safety evaluation.  -- Recommend routine observation per unit policy.  -- If the patient attempts to leave against medical advice and it is felt to be unsafe for them to leave, please call a Behavioral Response and page Psychiatry at [phone].    Medications:  -- if becomes agitated requiring prn medication, would avoid use of haloperidol (not preferred with HIV and prolonged QT).     Further Work-up:   -- No further recommendations at this time from a psychiatric standpoint    Behavioral / Environmental:   -- Please order Delirium (prevention) protocol: the following can be copied into a single misc nursing order.        - RN to open blinds every morning.        - To bedside: glasses, hearing aide, patient's own shoes. Make available to patient's when possible and encourage use.        - RN to assess orientation (person, place, & time) qam and prn, with frequent reorientation (verbal & whiteboard) & introduction of caregivers.           - Recommend extended visiting hours with familiar family/friends as feasible.        - Encourage normal sleep-wake cycle by promoting a dark, quiet environment at night and stimulating, light environment during the day.          - Turn the TV off when patient is asleep or not in use.    Follow-up:  -- When patient is discharged, please ensure that their AVS includes information about the [facility] Su[facility] [employer] Crisis Lifeline.  -- Deferred at this time.  -- We will follow as needed at this time.     Thank you for this consult request. Recommendations have been communicated to the primary team. Please page [phone]  for any questions or concerns.     Discussed with and seen by Attending, Dr. Clover Mealy, MD, who agrees with the assessment and plan.    Loletha Carrow, MD      Subjective     Relevant events since last seen by psychiatry: Patient noted to be fully oriented and able to complete days of week backwards since 6/07 per neurology progress notes.  He has an MRI pending Monday. ID is recommending holding antiviral therapy in the inpatient setting.    Patient Interview:  Patient reports his mood is okay.  Denies SI/HI.  Reports he feels safe in the hospital, denies that anyone is out to get him.  Does not display any signs of paranoia.  Denies history of auditory hallucinations.  When asked about visual hallucinations, reports he has seen ghosts, and his house before.  States this was not in the setting of substances.  States this most recently happened a few weeks ago.  Denies any visual hallucinations during his current hospitalization.  He was alert and oriented to person, place, and day of the week, month.  Thought the year was 2025.  Attention testing intact.    ROS: Patient did not voice any physical complaints during the interview.  Collateral:   - Reviewed medical records in Epic  - Spoke to Dr. Susette Racer, she reports the patient has had no paranoia or signs of psychosis recently.    Relevant Updates to past psychiatric, medical/surgical, family, or social history: None    Current Medications:  Scheduled Meds:  Current Facility-Administered Medications   Medication Dose Route Frequency Provider Last Rate Last Admin    aspirin chewable tablet 81 mg  81 mg Oral Daily Milinda Antis, MD   81 mg at 07/01/22 0853    atorvastatin (LIPITOR) tablet 80 mg  80 mg Oral Daily Gaynelle Arabian, MD   80 mg at 07/01/22 0853    doxycycline (VIBRA-TABS) tablet 100 mg  100 mg Oral BID Milinda Antis, MD   100 mg at 07/01/22 1803    enoxaparin (LOVENOX) syringe 90 mg  1 mg/kg Subcutaneous Q12H [facility] Milinda Antis, MD   90 mg at 07/01/22 4696    hepatitis A virus vaccine (PF) (HAVRIX ADULT) injection 1 mL  1 mL Intramuscular During hospitalization Almubaslat, Faris R, MD        labetalol (NORMODYNE,TRANDATE) injection 5 mg  5 mg Intravenous Q4H PRN Gaynelle Arabian, MD        metoPROLOL succinate (Toprol-XL) 24 hr tablet 50 mg  50 mg Oral Daily Almubaslat, Faris R, MD   50 mg at 07/01/22 2952    nicotine (NICODERM CQ) 14 mg/24 hr patch 1 patch  1 patch Transdermal Daily Gaynelle Arabian, MD        warfarin (JANTOVEN) tablet 5 mg  5 mg Oral Daily Milinda Antis, MD   5 mg at 07/01/22 1803     Continuous Infusions:  Current Facility-Administered Medications   Medication Dose Route Frequency Provider Last Rate Last Admin    aspirin chewable tablet 81 mg  81 mg Oral Daily Milinda Antis, MD   81 mg at 07/01/22 0853    atorvastatin (LIPITOR) tablet 80 mg  80 mg Oral Daily Gaynelle Arabian, MD   80 mg at 07/01/22 0853    doxycycline (VIBRA-TABS) tablet 100 mg  100 mg Oral BID Milinda Antis, MD   100 mg at 07/01/22 1803    enoxaparin (LOVENOX) syringe 90 mg  1 mg/kg Subcutaneous Q12H [facility] Milinda Antis, MD   90 mg at 07/01/22 8413    hepatitis A virus vaccine (PF) (HAVRIX ADULT) injection 1 mL  1 mL Intramuscular During hospitalization Almubaslat, Faris R, MD        labetalol (NORMODYNE,TRANDATE) injection 5 mg  5 mg Intravenous Q4H PRN Gaynelle Arabian, MD        metoPROLOL succinate (Toprol-XL) 24 hr tablet 50 mg  50 mg Oral Daily Almubaslat, Faris R, MD   50 mg at 07/01/22 2440    nicotine (NICODERM CQ) 14 mg/24 hr patch 1 patch  1 patch Transdermal Daily Gaynelle Arabian, MD        warfarin (JANTOVEN) tablet 5 mg  5 mg Oral Daily Milinda Antis, MD   5 mg at 07/01/22 1803     PRN Meds:.  Current Facility-Administered Medications   Medication Dose Route Frequency Provider Last Rate Last Admin    aspirin chewable tablet 81 mg  81 mg Oral Daily Milinda Antis, MD   81 mg at 07/01/22 0853    atorvastatin (LIPITOR) tablet 80 mg  80 mg Oral Daily Gaynelle Arabian, MD   80 mg at 07/01/22  1610    doxycycline (VIBRA-TABS) tablet 100 mg  100 mg Oral BID Milinda Antis, MD 100 mg at 07/01/22 1803    enoxaparin (LOVENOX) syringe 90 mg  1 mg/kg Subcutaneous Q12H [facility] Milinda Antis, MD   90 mg at 07/01/22 9604    hepatitis A virus vaccine (PF) (HAVRIX ADULT) injection 1 mL  1 mL Intramuscular During hospitalization Almubaslat, Faris R, MD        labetalol (NORMODYNE,TRANDATE) injection 5 mg  5 mg Intravenous Q4H PRN Gaynelle Arabian, MD        metoPROLOL succinate (Toprol-XL) 24 hr tablet 50 mg  50 mg Oral Daily Almubaslat, Faris R, MD   50 mg at 07/01/22 0853    nicotine (NICODERM CQ) 14 mg/24 hr patch 1 patch  1 patch Transdermal Daily Gaynelle Arabian, MD        warfarin (JANTOVEN) tablet 5 mg  5 mg Oral Daily Milinda Antis, MD   5 mg at 07/01/22 1803            Objective:   Vital signs:   Temp:  [36.2 ??C (97.2 ??F)-36.5 ??C (97.7 ??F)] 36.5 ??C (97.7 ??F)  Heart Rate:  [85-92] 89  Resp:  [16-18] 18  BP: (93-120)/(61-98) 109/83  MAP (mmHg):  [69-105] 92  SpO2:  [97 %-100 %] 99 %    Physical Exam:  Gen: No acute distress.  Pulm: Normal work of breathing.  Neuro/MSK: Bulk thin.  Skin: normal skin tone.    Mental Status Exam:  Appearance:  appears stated age, ill-appearing, and in bed   Attitude:   calm, cooperative   Behavior/Psychomotor:  appropriate eye contact   Speech/Language:   normal rate, not pressured, normal volume, improved fluency. normal articulation   Mood:  ???Okay???   Affect:   Euthymic   Thought process:  logical, linear, clear, coherent, goal directed   Thought content:     Denies HI/SI/paranoia.  Does not voice any delusional thought content.   Perceptual disturbances:   behavior not concerning for response to internal stimuli   Attention:  able to attend to interview without fluctuations in consciousness and on days of the week backwards performs correctly   Concentration:  Able to fully concentrate and attend   Orientation:  Oriented to person, place, day, month, and not to year.   Memory:  not formally tested, but grossly intact   Fund of knowledge: not formally assessed   Insight:    Fair   Judgment:   Fair   Impulse Control:  Fair     Relevant laboratory/imaging data was reviewed.    Additional Psychometric Testing:  Not applicable.    Consult Type and Time-Based Documentation:  This patient was evaluated in person.    Time-based billing disclaimer:  I personally spent 50   minutes face-to-face and non-face-to-face in the care of this patient, which includes all pre, intra, and post visit time on the date of service.  All documented time was specific to the E/M visit and does not include any procedures that may have been performed.
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
## 2022-07-01 NOTE — Unmapped (Incomplete)
Patient alert and oriented, in no pain.     Problem: Adult Inpatient Plan of Care  Goal: Plan of Care Review  Outcome: Progressing  Goal: Patient-Specific Goal (Individualized)  Outcome: Progressing  Goal: Absence of Hospital-Acquired Illness or Injury  Outcome: Progressing  Intervention: Prevent Skin Injury  Recent Flowsheet Documentation  Taken 07/01/2022 0805 by Shirleen Schirmer, RN  Positioning for Skin: Left  Device Skin Pressure Protection: absorbent pad utilized/changed  Goal: Optimal Comfort and Wellbeing  Outcome: Progressing  Goal: Readiness for Transition of Care  Outcome: Progressing  Goal: Rounds/Family Conference  Outcome: Progressing     Problem: Stroke, Ischemic (Includes Transient Ischemic Attack)  Goal: Optimal Coping  Outcome: Progressing  Goal: Effective Bowel Elimination  Outcome: Progressing  Goal: Optimal Cerebral Tissue Perfusion  Outcome: Progressing  Goal: Optimal Cognitive Function  Outcome: Progressing  Goal: Improved Communication Skills  Outcome: Progressing  Goal: Optimal Functional Ability  Outcome: Progressing  Goal: Optimal Nutrition Intake  Outcome: Progressing  Goal: Effective Oxygenation and Ventilation  Outcome: Progressing  Goal: Improved Sensorimotor Function  Outcome: Progressing  Intervention: Optimize Sensory and Perceptual Ability  Recent Flowsheet Documentation  Taken 07/01/2022 0805 by Shirleen Schirmer, RN  Pressure Reduction Techniques: frequent weight shift encouraged  Pressure Reduction Devices: pressure-redistributing mattress utilized  Goal: Safe and Effective Swallow  Outcome: Progressing  Goal: Effective Urinary Elimination  Outcome: Progressing     Problem: Fall Injury Risk  Goal: Absence of Fall and Fall-Related Injury  Outcome: Progressing     Problem: Self-Care Deficit  Goal: Improved Ability to Complete Activities of Daily Living  Outcome: Progressing

## 2022-07-02 LAB — PROTIME-INR
INR: 1.56
PROTIME: 17.2 s — ABNORMAL HIGH (ref 9.9–12.6)

## 2022-07-02 LAB — APTT
APTT: 56.7 s — ABNORMAL HIGH (ref 24.8–38.4)
HEPARIN CORRELATION: 0.3

## 2022-07-02 MED ADMIN — enoxaparin (LOVENOX) syringe 90 mg: 1 mg/kg | SUBCUTANEOUS | @ 13:00:00

## 2022-07-02 MED ADMIN — atorvastatin (LIPITOR) tablet 80 mg: 80 mg | ORAL | @ 13:00:00

## 2022-07-02 MED ADMIN — nicotine (NICODERM CQ) 14 mg/24 hr patch 1 patch: 1 | TRANSDERMAL | @ 13:00:00

## 2022-07-02 MED ADMIN — warfarin (JANTOVEN) tablet 5 mg: 5 mg | ORAL | @ 23:00:00

## 2022-07-02 MED ADMIN — doxycycline (VIBRA-TABS) tablet 100 mg: 100 mg | ORAL | @ 23:00:00 | Stop: 2022-07-03

## 2022-07-02 MED ADMIN — hepatitis A virus vaccine (PF) (HAVRIX ADULT) injection 1 mL: 1 mL | INTRAMUSCULAR | @ 23:00:00 | Stop: 2022-07-02

## 2022-07-02 MED ADMIN — polyethylene glycol (MIRALAX) packet 17 g: 17 g | ORAL | @ 13:00:00

## 2022-07-02 MED ADMIN — doxycycline (VIBRA-TABS) tablet 100 mg: 100 mg | ORAL | @ 09:00:00 | Stop: 2022-07-03

## 2022-07-02 MED ADMIN — aspirin chewable tablet 81 mg: 81 mg | ORAL | @ 13:00:00

## 2022-07-02 MED ADMIN — enoxaparin (LOVENOX) syringe 90 mg: 1 mg/kg | SUBCUTANEOUS

## 2022-07-03 LAB — PROTIME-INR
INR: 1.59
PROTIME: 17.6 s — ABNORMAL HIGH (ref 9.9–12.6)

## 2022-07-03 LAB — APTT
APTT: 61.5 s — ABNORMAL HIGH (ref 24.8–38.4)
HEPARIN CORRELATION: 0.4

## 2022-07-03 MED ADMIN — polyethylene glycol (MIRALAX) packet 17 g: 17 g | ORAL | @ 13:00:00

## 2022-07-03 MED ADMIN — metoPROLOL succinate (Toprol-XL) 24 hr tablet 50 mg: 50 mg | ORAL | @ 13:00:00

## 2022-07-03 MED ADMIN — warfarin (JANTOVEN) tablet 5 mg: 5 mg | ORAL | @ 21:00:00

## 2022-07-03 MED ADMIN — aspirin chewable tablet 81 mg: 81 mg | ORAL | @ 13:00:00

## 2022-07-03 MED ADMIN — nicotine (NICODERM CQ) 14 mg/24 hr patch 1 patch: 1 | TRANSDERMAL | @ 13:00:00

## 2022-07-03 MED ADMIN — magnesium hydroxide (MILK OF MAGNESIA) oral suspension: 30 mL | ORAL | @ 17:00:00

## 2022-07-03 MED ADMIN — doxycycline (VIBRA-TABS) tablet 100 mg: 100 mg | ORAL | @ 10:00:00 | Stop: 2022-07-03

## 2022-07-03 MED ADMIN — bictegrav-emtricit-tenofov ala (BIKTARVY) 50-200-25 mg tablet 1 tablet: 1 | ORAL | @ 20:00:00

## 2022-07-03 MED ADMIN — atorvastatin (LIPITOR) tablet 80 mg: 80 mg | ORAL | @ 13:00:00
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
## 2022-07-03 NOTE — Unmapped (Signed)
Neuro exam unchanged. VSS. Denies pain. No BM since admission, MD notified for additional bowel regimen. Milk of Magnesia given this afternoon, awaiting results at this time. Up to chair this morning for about an hour. Care ongoing.     Problem: Adult Inpatient Plan of Care  Goal: Plan of Care Review  Outcome: Progressing  Goal: Patient-Specific Goal (Individualized)  Outcome: Progressing  Flowsheets (Taken 07/03/2022 1456)  Patient/Family-Specific Goals (Include Timeframe): pt will have bm by end of day 6/11  Goal: Absence of Hospital-Acquired Illness or Injury  Outcome: Progressing  Intervention: Identify and Manage Fall Risk  Recent Flowsheet Documentation  Taken 07/03/2022 0800 by Louie Casa, RN  Safety Interventions:   bed alarm   fall reduction program maintained   low bed  Intervention: Prevent and Manage VTE (Venous Thromboembolism) Risk  Recent Flowsheet Documentation  Taken 07/03/2022 1400 by Louie Casa, RN  Anti-Embolism Intervention: Refused  Taken 07/03/2022 1200 by Louie Casa, RN  Anti-Embolism Intervention: Refused  Taken 07/03/2022 1000 by Louie Casa, RN  Anti-Embolism Intervention: Refused  Taken 07/03/2022 0800 by Louie Casa, RN  Anti-Embolism Intervention: Refused  Goal: Optimal Comfort and Wellbeing  Outcome: Progressing  Goal: Readiness for Transition of Care  Outcome: Progressing  Goal: Rounds/Family Conference  Outcome: Progressing     Problem: Stroke, Ischemic (Includes Transient Ischemic Attack)  Goal: Optimal Coping  Outcome: Progressing  Goal: Effective Bowel Elimination  Outcome: Progressing  Goal: Optimal Cerebral Tissue Perfusion  Outcome: Progressing  Goal: Optimal Cognitive Function  Outcome: Progressing  Goal: Improved Communication Skills  Outcome: Progressing  Goal: Optimal Functional Ability  Outcome: Progressing  Goal: Optimal Nutrition Intake  Outcome: Progressing  Goal: Effective Oxygenation and Ventilation  Outcome: Progressing  Goal: Improved Sensorimotor Function  Outcome: Progressing  Goal: Safe and Effective Swallow  Outcome: Progressing  Goal: Effective Urinary Elimination  Outcome: Progressing     Problem: Fall Injury Risk  Goal: Absence of Fall and Fall-Related Injury  Outcome: Progressing  Intervention: Promote Injury-Free Environment  Recent Flowsheet Documentation  Taken 07/03/2022 0800 by Louie Casa, RN  Safety Interventions:   bed alarm   fall reduction program maintained   low bed     Problem: Self-Care Deficit  Goal: Improved Ability to Complete Activities of Daily Living  Outcome: Progressing
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
## 2022-07-03 NOTE — Unmapped (Signed)
Warfarin Therapeutic Monitoring Pharmacy Note    Warren [name] is a 47 y.o. male  re-starting  warfarin.     Indication: mechanical aortic valve replacement (On-X valve type)    Prior Dosing Information: Previous regimen warfarin 5 mg daily       Source(s) of information used to determine prior to admission dosing: Fill HIstory    Goals:  Therapeutic Drug Levels  INR range: 1.5-2 per cardiology    Additional Clinical Monitoring/Outcomes  Monitor hemoglobin and platelets  Monitor for signs and symptoms of bleeding  Monitor liver function (LFTs, bilirubin)    Results:  Lab Results   Component Value Date    INR 1.59 07/03/2022    INR 1.56 07/02/2022    INR 1.43 07/01/2022       Pharmacokinetic Considerations and Significant Drug Interactions:   Drug Interactions  see below table    Bridge Therapy  Enoxaparin    Concurrent Antiplatelet Medications  none identified    Assessment/Plan:  Recommendation(s)  INR is therapeutic   Continue current regimen of warfarin 5 mg daily  Discontinue enoxaparin bridge since INR within goal x 24 hours    Follow-up  INR to be obtained: daily with AM labs    A pharmacist will continue to monitor and recommend INRs/dose changes as appropriate    Longitudinal Dose Monitoring:  Date AM INR PM Dose (mg) Key Drug Interactions   6/11 1.59 5 mg Doxycycline (ends 6/11 AM)   6/10 1.56 5 mg Doxycycline   6/9 1.43 5 mg Doxycycline   6/8 1.08 5 mg Doxycycline   6/7 1.01 5 mg Doxycycline   6/6 0.97 5 mg Doxycycline   6/5 -- 5 mg Doxycycline, IM Penicillin G   6/4 0.96 --- ---     A pharmacist will continue to monitor the INR daily and adjust the warfarin dose in conjunction with the medical team as appropriate. Please page service pharmacist with questions/clarifications.    Junie Bame, PharmD, BCPS, [facility]  Neurology Clinical Pharmacist  NMA: [phone], NMB: [phone]

## 2022-07-03 NOTE — Unmapped (Signed)
[facility]  Follow-Up Psychiatry Consult Note      Date of admission: 06/26/2022  9:41 PM  Service Date: July 03, 2022  Primary Team: Neurology (NEU)  LOS:  LOS: 7 days      Assessment:   Warren [name]. is a 47 y.o. 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  Patient was seen in consultation by request of De Blanch, MD for evaluation of psychosis.     Warren [name]. presents with symptoms consistent with a diagnosis of unspecified psychosis with paranoid delusions that led to noncompliance with medications at home. Patient's report of feeling spirits throughout life, lack of family contact, and chronic emotional lability can be consistent with history of trauma, personality factors, or psychological distress. Complicating the patient's presentation includes recent stroke, advanced HIV, and chronic substance use. It is less likely that the patient's presentation is from a primary psychotic disorder given timeline of delusions (psychosis was not present over 1 year ago). The differential in the setting of advanced HIV and polysubstance use is broad and includes HIV-related neurocognitive disorder and substance-induced psychosis. See initial note for timeline based on collateral.     Patient's thought process has been slightly more organized with less latency in speech. He requires less reorientation during questioning than earlier in hospitalization. There was concern for post-stroke delirium initially due to poor attention/orientation, and as advanced HIV and chronic substance use increase likelihood of poor cognitive reserve and create vulnerability for delirium; however, orientation and attention have improved. Patient has not required antipsychotics in the hospital for behavioral disturbances, as primary treatment is medication compliance (could consider a longer-acting HIV medication) and abstaining from psychoactive substances. If desired to start an antipsychotic, could consider low dose Risperdal at night (see below). Patient may benefit from additional assistance for medication compliance upon discharge, but will defer to primary team and case management. Will continue to follow.        Diagnoses:   Active Hospital problems:  Principal Problem:    Ischemic stroke (CMS-HCC)  Active Problems:    History of prosthetic aortic valve    HIV (human immunodeficiency virus infection) (CMS-HCC)    Tobacco use disorder    HLD (hyperlipidemia)    Hx of repair of aortic root    Psychosis (CMS-HCC)       Problems edited/added by me:  Problem   Psychosis (Cms-Hcc)       Risk Assessment:  ASQ screening result: low risk    -A suicide and violence risk assessment was performed as part of this evaluation. Risk factors for self-harm/suicide: lack of social support, sense of isolation, current substance abuse, history of substance abuse, poor adherence to treatment , recent trauma, and chronic mood lability .  Protective factors against self-harm/suicide:  lack of active SI, no history of previous suicide attempts , supportive friends, enjoyment of leisure actvities, expresses purpose for living, religious or spiritual prohibition to suicide/violence, and safe housing.  Risk factors for harm to others: current substance abuse, active symptoms of psychosis, limited work history, and past substance abuse. Protective factors against harm to others:  no known history of violence towards others, no known history of threats of harm towards others, no active symptoms of mania, no previous acts of violence in current setting, intolerant attititude toward deviance, positive social orientation, and religiosity.     Current suicide risk: low risk  Current homicide risk: low risk    Recommendations:     Safety and Observation Level:   -- This patient does NOT meet involuntary commitment (IVC) criteria. IVC paperwork was never filed. Please contact our team if there is a concern that risk level has changed.  -- Recommend routine observation per unit policy.  -- If the patient attempts to leave against medical advice and it is felt to be unsafe for them to leave, please call a Behavioral Response and page Psychiatry at [phone].    Medications:  -- Antipsychotics have not been needed during hospitalization due to medication compliance and cooperativeness; however, if patient is agreeable to an antipsychotic for paranoia, would recommend starting low-dose Risperdal (such as 0.5 mg HS)    Further Work-up:   -- While the patient is receiving medications (such as Risperdal) that may prolong QTc and increase risk for torsades:    - MONITOR and KEEP Mg>2 and K>4      - MONITOR QTc regularly.  If QTc on tele strip >463ms, obtain 12-lead EKG.    Behavioral / Environmental:   -- Please continue Delirium (prevention) protocol detailed in initial consult note.    Follow-up:  -- When patient is discharged, please ensure that their AVS includes information about the 67 Suicide & Crisis Lifeline.  -- Patient may need extra support once discharged due to cognitive issues, but will defer to case management. Psychotic symptoms should be managed by optimizing medical treatment and abstaining from substances. He would benefit from establishing with [facility] and having neuropsych follow-up.  -- We will follow as needed at this time.     Thank you for this consult request. Recommendations have been communicated to the primary team. Please page [phone]  for any questions or concerns.     Discussed with and seen by Attending, Loanne Drilling, MD, who agrees with the assessment and plan.    Almedia Balls, DO      Subjective     Relevant events since last seen by psychiatry:  Patient is cleared for SNF or AIR  MRI discontinued due to pacemaker risk    Patient Interview:     Patient reports he is doing okay all considering. Unable to clearly state he has a stroke, but states he is in the hospital because of the rehabilitation issue and points to his right leg. States his right leg can barely move and he needs rehab. After reminded of the word stroke he states he had 2 more strokes before this. Discussed his medication non-compliance, and he reports that he was paranoid about taking medications at home due to people stalking him for a family inheritance case (that ends on 6/23). He denies paranoia to take medications in the hospital even though he feels he's still being monitored. He reports he knows he needs to take his Biktarvy and other medications, and plans to do so after rehab. He becomes tearful at times when discussing family, and says he's always been that way (easily tearful). States he has been also feeling spirits since childhood, but denies this experience in the hospital. He feels safe and that people are treating him well. He does state after talking about his court  case that I know that all sounds crazy. He declines to talk about family issues, but states he is not in contact with any family members and lives alone. He denies he'll need extra support after rehab, and that he has friends that can help. Denies SI, HI, or auditory/visual hallucinations. Oriented to month, year, place, situation (with word finding difficulties), and able to do DOWB without error and MOYB with 2 errors. Not oriented to date.       ROS:   All systems reviewed as negative/unremarkable aside from the following pertinent positives and negatives: None    Collateral:   - Reviewed medical records in Epic    Relevant Updates to past psychiatric, medical/surgical, family, or social history: None    Current Medications:  Scheduled Meds:   aspirin  81 mg Oral Daily    atorvastatin  80 mg Oral Daily    metoPROLOL succinate  50 mg Oral Daily    nicotine  1 patch Transdermal Daily    polyethylene glycol  17 g Oral Daily    warfarin  5 mg Oral Daily     Continuous Infusions:  PRN Meds:.labetalol, magnesium hydroxide     Objective:   Vital signs:   Temp:  [36.4 ??C (97.5 ??F)-36.7 ??C (98.1 ??F)] 36.6 ??C (97.9 ??F)  Heart Rate:  [89-91] 90  Resp:  [16-18] 18  BP: (101-119)/(66-93) 119/82  MAP (mmHg):  [77-101] 92  SpO2:  [97 %-100 %] 100 %    Physical Exam:  Gen: No acute distress.  Pulm: Normal work of breathing.  Neuro/MSK: Bulk thin.  Skin: normal skin tone.    Mental Status Exam:  Appearance:  appears stated age and in bed   Attitude:   calm, cooperative   Behavior/Psychomotor:  appropriate eye contact   Speech/Language:   normal rate, not pressured, normal volume, improved fluency. normal articulation   Mood:  ???okay???   Affect:   Euthymic, tearful when discussing family   Thought process:   Linear, some word-finding difficulties at times   Thought content:     Denies self-harm/SI thoughts, endorses paranoia about people monitoring him until 6/23 in an inheritance case   Perceptual disturbances:   behavior not concerning for response to internal stimuli   Attention:  able to attend to interview without fluctuations in consciousness, on months of the year backwards, performs with error (2), and on days of the week backwards performs correctly   Concentration:  Able to fully concentrate and attend   Orientation:  Oriented to person, place, city, month, year, and situation. and Disoriented to  date.   Memory:  not formally tested, but grossly intact   Fund of knowledge:   not formally assessed   Insight:    Limited   Judgment:   Fair   Impulse Control:  Intact     Relevant laboratory/imaging data was reviewed.    Additional Psychometric Testing:  Not applicable.    Consult Type and Time-Based Documentation:  This patient was evaluated in person.    Time-based billing disclaimer:  I personally spent 40   minutes face-to-face and non-face-to-face in the care of this patient, which includes all pre, intra, and post visit time on the date of service.  All documented time was specific to the E/M visit and does not include any procedures that may have been performed.

## 2022-07-04 ENCOUNTER — Ambulatory Visit
Admission: TF | Admit: 2022-07-04 | Discharge: 2022-07-20 | Disposition: A | Discharge status: Home / Self Care | Payer: MEDICARE | Source: Intra-hospital

## 2022-07-04 ENCOUNTER — Ambulatory Visit
Admission: TF | Admit: 2022-07-04 | Disposition: A | Discharge status: Still In Hospital | Payer: MEDICARE | Source: Intra-hospital

## 2022-07-04 LAB — BASIC METABOLIC PANEL
ANION GAP: 5 mmol/L (ref 5–14)
BLOOD UREA NITROGEN: 11 mg/dL (ref 9–23)
BUN / CREAT RATIO: 11
CALCIUM: 9.2 mg/dL (ref 8.7–10.4)
CHLORIDE: 107 mmol/L (ref 98–107)
CO2: 25 mmol/L (ref 20.0–31.0)
CREATININE: 1.02 mg/dL
EGFR CKD-EPI (2021) MALE: >90 mL/min/{1.73_m2} (ref >=60)
GLUCOSE RANDOM: 105 mg/dL (ref 70–179)
POTASSIUM: 3.7 mmol/L (ref 3.4–4.8)
SODIUM: 137 mmol/L (ref 135–145)

## 2022-07-04 LAB — CBC W/ AUTO DIFF
BASOPHILS ABSOLUTE COUNT: 0 10*9/L (ref 0.0–0.1)
BASOPHILS RELATIVE PERCENT: 1 %
EOSINOPHILS ABSOLUTE COUNT: 0 10*9/L (ref 0.0–0.5)
EOSINOPHILS RELATIVE PERCENT: 1.3 %
HEMATOCRIT: 48.8 % — ABNORMAL HIGH (ref 39.0–48.0)
HEMOGLOBIN: 16 g/dL (ref 12.9–16.5)
LYMPHOCYTES ABSOLUTE COUNT: 1.2 10*9/L (ref 1.1–3.6)
LYMPHOCYTES RELATIVE PERCENT: 33.4 %
MEAN CORPUSCULAR HEMOGLOBIN CONC: 32.8 g/dL (ref 32.0–36.0)
MEAN CORPUSCULAR HEMOGLOBIN: 29.3 pg (ref 25.9–32.4)
MEAN CORPUSCULAR VOLUME: 89.2 fL (ref 77.6–95.7)
MEAN PLATELET VOLUME: 7.5 fL (ref 6.8–10.7)
MONOCYTES ABSOLUTE COUNT: 0.3 10*9/L (ref 0.3–0.8)
MONOCYTES RELATIVE PERCENT: 8.5 %
NEUTROPHILS ABSOLUTE COUNT: 2.1 10*9/L (ref 1.8–7.8)
NEUTROPHILS RELATIVE PERCENT: 55.8 %
PLATELET COUNT: 237 10*9/L (ref 150–450)
RED BLOOD CELL COUNT: 5.48 10*12/L (ref 4.26–5.60)
RED CELL DISTRIBUTION WIDTH: 12.8 % (ref 12.2–15.2)
WBC ADJUSTED: 3.7 10*9/L (ref 3.6–11.2)

## 2022-07-04 LAB — PROTIME-INR
INR: 1.54
PROTIME: 17 s — ABNORMAL HIGH (ref 9.9–12.6)

## 2022-07-04 LAB — TSH: THYROID STIMULATING HORMONE: 3.433 u[IU]/mL (ref 0.550–4.780)

## 2022-07-04 MED ORDER — WARFARIN 5 MG TABLET
ORAL_TABLET | Freq: Every day | ORAL | 11 refills | 30.00000 days | Status: CN
Start: 2022-07-04 — End: ?

## 2022-07-04 MED ORDER — BIKTARVY 50 MG-200 MG-25 MG TABLET
ORAL_TABLET | Freq: Every day | ORAL | 11 refills | 30.00000 days | Status: CN
Start: 2022-07-04 — End: ?

## 2022-07-04 MED ADMIN — bictegrav-emtricit-tenofov ala (BIKTARVY) 50-200-25 mg tablet 1 tablet: 1 | ORAL | @ 15:00:00 | Stop: 2022-07-04

## 2022-07-04 MED ADMIN — warfarin (JANTOVEN) tablet 5 mg: 5 mg | ORAL | @ 21:00:00

## 2022-07-04 MED ADMIN — nicotine (NICODERM CQ) 14 mg/24 hr patch 1 patch: 1 | TRANSDERMAL | @ 15:00:00 | Stop: 2022-07-04

## 2022-07-04 MED ADMIN — metoPROLOL succinate (Toprol-XL) 24 hr tablet 50 mg: 50 mg | ORAL | @ 15:00:00 | Stop: 2022-07-04

## 2022-07-04 MED ADMIN — aspirin chewable tablet 81 mg: 81 mg | ORAL | @ 15:00:00 | Stop: 2022-07-04

## 2022-07-04 MED ADMIN — atorvastatin (LIPITOR) tablet 80 mg: 80 mg | ORAL | @ 15:00:00 | Stop: 2022-07-04
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
## 2022-07-04 NOTE — Unmapped (Signed)
Warfarin Therapeutic Monitoring Pharmacy Note    Warren [name] is a 47 y.o. male  re-starting  warfarin.     Indication: mechanical aortic valve replacement (On-X valve type)    Prior Dosing Information: Previous regimen warfarin 5 mg daily       Source(s) of information used to determine prior to admission dosing: Fill HIstory    Goals:  Therapeutic Drug Levels  INR range: 1.5-2 per cardiology    Additional Clinical Monitoring/Outcomes  Monitor hemoglobin and platelets  Monitor for signs and symptoms of bleeding  Monitor liver function (LFTs, bilirubin)    Results:  Lab Results   Component Value Date    INR 1.54 07/04/2022    INR 1.59 07/03/2022    INR 1.56 07/02/2022       Pharmacokinetic Considerations and Significant Drug Interactions:   Drug Interactions  see below table    Bridge Therapy  Enoxaparin    Concurrent Antiplatelet Medications  none identified    Assessment/Plan:  Recommendation(s)  INR is therapeutic   Continue current regimen of warfarin 5 mg daily  Discontinue enoxaparin bridge since INR within goal x 24 hours    Follow-up  INR to be obtained: daily with AM labs    A pharmacist will continue to monitor and recommend INRs/dose changes as appropriate    Longitudinal Dose Monitoring:  Date AM INR PM Dose (mg) Key Drug Interactions   6/12 1.54 5 mg N/A   6/11 1.59 5 mg Doxycycline (ends 6/11 AM)   6/10 1.56 5 mg Doxycycline   6/9 1.43 5 mg Doxycycline   6/8 1.08 5 mg Doxycycline   6/7 1.01 5 mg Doxycycline   6/6 0.97 5 mg Doxycycline   6/5 -- 5 mg Doxycycline, IM Penicillin G   6/4 0.96 --- ---     A pharmacist will continue to monitor the INR daily and adjust the warfarin dose in conjunction with the medical team as appropriate. Please page service pharmacist with questions/clarifications.    Rance Muir, PharmD, BCPS

## 2022-07-04 NOTE — Unmapped (Signed)
Problem: Adult Inpatient Plan of Care  Goal: Plan of Care Review  Outcome: Ongoing - Unchanged  Flowsheets (Taken 07/04/2022 0655)  Progress: no change  Plan of Care Reviewed With: patient  Goal: Patient-Specific Goal (Individualized)  Outcome: Ongoing - Unchanged  Goal: Absence of Hospital-Acquired Illness or Injury  Outcome: Ongoing - Unchanged  Intervention: Identify and Manage Fall Risk  Recent Flowsheet Documentation  Taken 07/03/2022 2000 by Veneta Penton, RN BSN  Safety Interventions:   bed alarm   fall reduction program maintained   low bed   nonskid shoes/slippers when out of bed   room near unit station  Intervention: Prevent Skin Injury  Recent Flowsheet Documentation  Taken 07/04/2022 0400 by Veneta Penton, RN BSN  Positioning for Skin: Right  Taken 07/04/2022 0200 by Veneta Penton, RN BSN  Positioning for Skin: Right  Taken 07/04/2022 0000 by Veneta Penton, RN BSN  Positioning for Skin: Right  Taken 07/03/2022 2200 by Veneta Penton, RN BSN  Positioning for Skin: Left  Taken 07/03/2022 2000 by Veneta Penton, RN BSN  Positioning for Skin: Supine/Back  Intervention: Prevent and Manage VTE (Venous Thromboembolism) Risk  Recent Flowsheet Documentation  Taken 07/03/2022 2000 by Veneta Penton, RN BSN  Anti-Embolism Intervention: Refused  Goal: Optimal Comfort and Wellbeing  Outcome: Ongoing - Unchanged  Goal: Readiness for Transition of Care  Outcome: Ongoing - Unchanged  Goal: Rounds/Family Conference  Outcome: Ongoing - Unchanged     Problem: Stroke, Ischemic (Includes Transient Ischemic Attack)  Goal: Optimal Coping  Outcome: Ongoing - Unchanged  Goal: Effective Bowel Elimination  Outcome: Ongoing - Unchanged  Goal: Optimal Cerebral Tissue Perfusion  Outcome: Ongoing - Unchanged  Goal: Optimal Cognitive Function  Outcome: Ongoing - Unchanged  Goal: Improved Communication Skills  Outcome: Ongoing - Unchanged  Goal: Optimal Functional Ability  Outcome: Ongoing - Unchanged  Goal: Optimal Nutrition Intake  Outcome: Ongoing - Unchanged  Goal: Effective Oxygenation and Ventilation  Outcome: Ongoing - Unchanged  Intervention: Optimize Oxygenation and Ventilation  Recent Flowsheet Documentation  Taken 07/03/2022 2000 by Veneta Penton, RN BSN  Head of Bed [facility]) Positioning: [facility] elevated  Goal: Improved Sensorimotor Function  Outcome: Ongoing - Unchanged  Intervention: Optimize Sensory and Perceptual Ability  Recent Flowsheet Documentation  Taken 07/04/2022 0400 by Veneta Penton, RN BSN  Pressure Reduction Techniques: frequent weight shift encouraged  Taken 07/04/2022 0200 by Veneta Penton, RN BSN  Pressure Reduction Techniques: frequent weight shift encouraged  Taken 07/04/2022 0000 by Veneta Penton, RN BSN  Pressure Reduction Techniques: frequent weight shift encouraged  Taken 07/03/2022 2200 by Veneta Penton, RN BSN  Pressure Reduction Techniques: frequent weight shift encouraged  Taken 07/03/2022 2000 by Veneta Penton, RN BSN  Pressure Reduction Techniques: frequent weight shift encouraged  Goal: Safe and Effective Swallow  Outcome: Ongoing - Unchanged  Goal: Effective Urinary Elimination  Outcome: Ongoing - Unchanged     Problem: Fall Injury Risk  Goal: Absence of Fall and Fall-Related Injury  Outcome: Ongoing - Unchanged  Intervention: Promote Injury-Free Environment  Recent Flowsheet Documentation  Taken 07/03/2022 2000 by Veneta Penton, RN BSN  Safety Interventions:   bed alarm   fall reduction program maintained   low bed   nonskid shoes/slippers when out of bed   room near unit station

## 2022-07-04 NOTE — Unmapped (Signed)
Please schedule patient for follow as follows:    Resident Clinic  Template: HOSPITAL FOLLOW UP [place]   Time: 1 hour slot  Provider: Almubaslat or Ramya Vanbergen   Diagnosis: stroke  Time range: Non-Urgent: between 2 - 3 months (do not schedule sooner)  Day of week: Any

## 2022-07-04 NOTE — Unmapped (Signed)
[facility]  Follow-Up Psychiatry Consult Note      Date of admission: 06/26/2022  9:41 PM  Service Date: July 04, 2022  Primary Team: Neurology (NEU)  LOS:  LOS: 8 days      Assessment:   Warren Keniston. is a 47 y.o. 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  Patient was seen in consultation by request of No att. providers found for evaluation of psychosis.     Warren Dove. presents with symptoms consistent with a diagnosis of unspecified psychosis with paranoid delusions that led to noncompliance with medications at home. Patient's report of feeling spirits throughout life, lack of family contact, and chronic emotional lability can be consistent with history of trauma, personality factors, or psychological distress. Complicating the patient's presentation includes recent stroke, advanced HIV, and chronic substance use. It is less likely that the patient's presentation is from a primary psychotic disorder given timeline of delusions (psychosis was not present over 1 year ago). The differential in the setting of advanced HIV and polysubstance use is broad and includes HIV-related neurocognitive disorder and substance-induced psychosis. See initial note for timeline based on collateral.     There was concern for post-stroke delirium initially due to poor attention/orientation, and as advanced HIV and chronic substance use increase likelihood of poor cognitive reserve and create vulnerability for delirium; however, orientation and attention have improved. Today, described concerns about monitoring/surveillance, a criminal and civil lawsuit from family over inheritance, believe in special powers (being a 'Insurance underwriter') in more detail (see HPI). While some aspects of his narrative may be reality based (e.g., it is plausible that he has an inheritance that led to family conflict), his thought process is pertinent for loose associations and disorganization when discussing these concerns, consistent with psychosis. Etiology still likely mixed as above, though it is possible that he could have a primary thought disorder. Assessment remains limited without collateral from earlier in life. Discussed with patient that antipsychotic medication can help with anxious distress related to his concerns about being monitored, and may help make it easier for him to take his HIV medication without feeling as worried about being monitored. He is willing to trial low dose risperidone this evening. Recommend starting risperidone as below.        Diagnoses:   Active Hospital problems:  Principal Problem:    Ischemic stroke (CMS-HCC)  Active Problems:    History of prosthetic aortic valve    HIV (human immunodeficiency virus infection) (CMS-HCC)    Tobacco use disorder    HLD (hyperlipidemia)    Hx of repair of aortic root    Psychosis (CMS-HCC)       Problems edited/added by me:  No problems updated.      Risk Assessment:  ASQ screening result: low risk    -A suicide and violence risk assessment was performed as part of this evaluation. Risk factors for self-harm/suicide: lack of social support, sense of isolation, current substance abuse, history of substance abuse, poor adherence to treatment , recent trauma, and chronic mood lability .  Protective factors against self-harm/suicide:  lack of  active SI, no history of previous suicide attempts , supportive friends, enjoyment of leisure actvities, expresses purpose for living, religious or spiritual prohibition to suicide/violence, and safe housing.  Risk factors for harm to others: current substance abuse, active symptoms of psychosis, limited work history, and past substance abuse. Protective factors against harm to others: no known history of violence towards others, no known history of threats of harm towards others, no active symptoms of mania, no previous acts of violence in current setting, intolerant attititude toward deviance, positive social orientation, and religiosity.     Current suicide risk: low risk  Current homicide risk: low risk    Recommendations:     Safety and Observation Level:   -- This patient does NOT meet involuntary commitment (IVC) criteria. IVC paperwork was never filed. Please contact our team if there is a concern that risk level has changed.  -- Recommend routine observation per unit policy.  -- If the patient attempts to leave against medical advice and it is felt to be unsafe for them to leave, please call a Behavioral Response and page Psychiatry at [phone].    Medications:  -- START Risperdal po 0.5 mg HS    Further Work-up:   -- While the patient is receiving medications (such as Risperdal) that may prolong QTc and increase risk for torsades:    - MONITOR and KEEP Mg>2 and K>4      - MONITOR QTc regularly.  If QTc on tele strip >437ms, obtain 12-lead EKG.    Behavioral / Environmental:   -- Please continue Delirium (prevention) protocol detailed in initial consult note.    Follow-up:  -- When patient is discharged, please ensure that their AVS includes information about the 14 Suicide & Crisis Lifeline.  -- Patient may need extra support once discharged due to cognitive issues, but will defer to case management. Psychotic symptoms should be managed by optimizing medical treatment and abstaining from substances. He would benefit from establishing with [facility] and having neuropsych follow-up.  -- We will follow as needed at this time.     Thank you for this consult request. Recommendations have been communicated to the primary team. Please page [phone]  for any questions or concerns.     Discussed with and seen by Attending, Loanne Drilling, MD, who agrees with the assessment and plan.    Trevor Iha, MD      Subjective     Relevant events since last seen by psychiatry:  Patient is cleared for SNF or AIR  MRI discontinued due to pacemaker risk    Patient Interview:   Denies concerns about taking biktarvy   Case will be over soon, criminal, civil  Didn't feel safe to take the medication - people watching him, monitoring him.   Court case is coming up, planning to move   God hasn't told him who to choose - two people to go with him when he moves and continues his journey  Has to choose the lesser of the two evils   Says his whole family are masons but his journey is different, more religious. Family is fighting him about that.   Fighting forces, wickedness, witchcraft for a long time.   Says unlike his family, he's Ephriam Knuckles  'I've been trying to heal people, heal the vibrations, raise the vibrations, I'm a light worker'  Everything around is really dark, opposing force against him  Had this ability to work with light since age 4.   Someone gave  him a moses stick at age 72.   Says his mom's pregnancy was complicated, thought about abortion, that let family know he was different   Court case is about fraud  Says his birth name was 'pinesol-lysol'  Businesses have to be given back to him  Mom had schizophrenia   Mom reincarnated as a white woman, now his neighbor  Denies feeling threatened by his neighbor     Says its hard to have privacy at home to take his medications because he's under surveillance     Says he was taking ritalin at age 61 - didn't like this, made his slow, groggy   In agreement to taking risperidone     14th   June 2024   Warren [name]   Dowb correct     ROS:   All systems reviewed as negative/unremarkable aside from the following pertinent positives and negatives: None    Collateral:   - Reviewed medical records in Epic    Relevant Updates to past psychiatric, medical/surgical, family, or social history: None    Current Medications:  Scheduled Meds:      Continuous Infusions:  PRN Meds:.     Objective:   Vital signs:   Temp:  [36.3 ??C (97.3 ??F)-36.8 ??C (98.2 ??F)] 36.8 ??C (98.2 ??F)  Heart Rate:  [89-92] 89  Resp:  [17-18] 18  BP: (105-116)/(77-87) 112/82  MAP (mmHg):  [89-97] 92  SpO2:  [97 %-100 %] 97 %    Physical Exam:  Gen: No acute distress.  Pulm: Normal work of breathing.  Neuro/MSK: Bulk thin.  Skin: normal skin tone.    Mental Status Exam:  Appearance:  appears stated age and in bed   Attitude:   calm, cooperative   Behavior/Psychomotor:  appropriate eye contact   Speech/Language:   normal rate, not pressured, normal volume, improved fluency. normal articulation   Mood:  ???okay???   Affect:   Euthymic   Thought process:   Linear, some word-finding difficulties at times   Thought content:     Denies self-harm/SI thoughts, endorses paranoia about people monitoring him until 6/23 in an inheritance case   Perceptual disturbances:   behavior not concerning for response to internal stimuli   Attention:  able to attend to interview without fluctuations in consciousness   Concentration:  Able to fully concentrate and attend   Orientation:  Oriented to person, place, city, month, year, and situation. and Disoriented to  date.   Memory:  not formally tested, but grossly intact   Fund of knowledge:   not formally assessed   Insight:    Limited   Judgment:   Fair   Impulse Control:  Intact     Relevant laboratory/imaging data was reviewed.    Additional Psychometric Testing:  Not applicable.    Consult Type and Time-Based Documentation:  This patient was evaluated in person.    Time-based billing disclaimer:  I personally spent 40   minutes face-to-face and non-face-to-face in the care of this patient, which includes all pre, intra, and post visit time on the date of service.  All documented time was specific to the E/M visit and does not include any procedures that may have been performed.
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
## 2022-07-04 NOTE — Unmapped (Signed)
Problem: Adult Inpatient Plan of Care  Goal: Plan of Care Review  Outcome: Transitioned to Another Facility  Goal: Patient-Specific Goal (Individualized)  Outcome: Transitioned to Another Facility  Goal: Absence of Hospital-Acquired Illness or Injury  Outcome: Transitioned to Another Facility  Goal: Optimal Comfort and Wellbeing  Outcome: Transitioned to Another Facility  Goal: Readiness for Transition of Care  Outcome: Transitioned to Another Facility  Goal: Rounds/Family Conference  Outcome: Transitioned to Another Facility     Problem: Stroke, Ischemic (Includes Transient Ischemic Attack)  Goal: Optimal Coping  Outcome: Transitioned to Another Facility  Goal: Effective Bowel Elimination  Outcome: Transitioned to Another Facility  Goal: Optimal Cerebral Tissue Perfusion  Outcome: Transitioned to Another Facility  Goal: Optimal Cognitive Function  Outcome: Transitioned to Another Facility  Goal: Improved Communication Skills  Outcome: Transitioned to Another Facility  Goal: Optimal Functional Ability  Outcome: Transitioned to Another Facility  Goal: Optimal Nutrition Intake  Outcome: Transitioned to Another Facility  Goal: Effective Oxygenation and Ventilation  Outcome: Transitioned to Another Facility  Goal: Improved Sensorimotor Function  Outcome: Transitioned to Another Facility  Goal: Safe and Effective Swallow  Outcome: Transitioned to Another Facility  Goal: Effective Urinary Elimination  Outcome: Transitioned to Another Facility     Problem: Fall Injury Risk  Goal: Absence of Fall and Fall-Related Injury  Outcome: Transitioned to Another Facility     Problem: Self-Care Deficit  Goal: Improved Ability to Complete Activities of Daily Living  Outcome: Transitioned to Another Facility

## 2022-07-04 NOTE — Unmapped (Signed)
8:53 AM  I spoke with Jannifer Hick and he states he is able to provide 24/7 assistance to patient and is the person who brought patient to hospital and will pick him up at discharge. Unable to reach Trey Paula (other friend patient listed) at [phone].    Duayne Cal, AGNP

## 2022-07-04 NOTE — Unmapped (Addendum)
Warren [name] [phone]. 47 y.o. 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 Slowly increasing Risperdol but Qtc 487. Should we be concerned about QT prolongation iso PPM? [name] [phone]
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
## 2022-07-05 LAB — BASIC METABOLIC PANEL
ANION GAP: 3 mmol/L — ABNORMAL LOW (ref 5–14)
BLOOD UREA NITROGEN: 11 mg/dL (ref 9–23)
BUN / CREAT RATIO: 10
CALCIUM: 9.2 mg/dL (ref 8.7–10.4)
CHLORIDE: 110 mmol/L — ABNORMAL HIGH (ref 98–107)
CO2: 26.1 mmol/L (ref 20.0–31.0)
CREATININE: 1.1 mg/dL
EGFR CKD-EPI (2021) MALE: 85 mL/min/{1.73_m2} (ref >=60)
GLUCOSE RANDOM: 81 mg/dL (ref 70–179)
POTASSIUM: 4.3 mmol/L (ref 3.4–4.8)
SODIUM: 139 mmol/L (ref 135–145)

## 2022-07-05 LAB — CBC
HEMATOCRIT: 47.8 % (ref 39.0–48.0)
HEMOGLOBIN: 15.7 g/dL (ref 12.9–16.5)
MEAN CORPUSCULAR HEMOGLOBIN CONC: 32.8 g/dL (ref 32.0–36.0)
MEAN CORPUSCULAR HEMOGLOBIN: 29.3 pg (ref 25.9–32.4)
MEAN CORPUSCULAR VOLUME: 89.4 fL (ref 77.6–95.7)
MEAN PLATELET VOLUME: 7.6 fL (ref 6.8–10.7)
PLATELET COUNT: 246 10*9/L (ref 150–450)
RED BLOOD CELL COUNT: 5.34 10*12/L (ref 4.26–5.60)
RED CELL DISTRIBUTION WIDTH: 12.6 % (ref 12.2–15.2)
WBC ADJUSTED: 4.1 10*9/L (ref 3.6–11.2)

## 2022-07-05 LAB — MAGNESIUM: MAGNESIUM: 1.9 mg/dL (ref 1.6–2.6)

## 2022-07-05 LAB — PROTIME-INR
INR: 1.79
PROTIME: 19.5 s — ABNORMAL HIGH (ref 9.9–12.6)

## 2022-07-05 LAB — PHOSPHORUS: PHOSPHORUS: 3.2 mg/dL (ref 2.4–5.1)

## 2022-07-05 MED ADMIN — bictegrav-emtricit-tenofov ala (BIKTARVY) 50-200-25 mg tablet 1 tablet: 1 | ORAL | @ 14:00:00

## 2022-07-05 MED ADMIN — warfarin (JANTOVEN) tablet 5 mg: 5 mg | ORAL | @ 22:00:00

## 2022-07-05 MED ADMIN — atorvastatin (LIPITOR) tablet 80 mg: 80 mg | ORAL | @ 14:00:00

## 2022-07-05 MED ADMIN — metoPROLOL succinate (Toprol-XL) 24 hr tablet 50 mg: 50 mg | ORAL | @ 14:00:00

## 2022-07-05 MED ADMIN — nicotine (NICODERM CQ) 14 mg/24 hr patch 1 patch: 1 | TRANSDERMAL | @ 14:00:00

## 2022-07-05 MED ADMIN — aspirin chewable tablet 81 mg: 81 mg | ORAL | @ 14:00:00

## 2022-07-05 NOTE — Unmapped (Addendum)
Stroke Resources    Immediately after a stroke, as a patient or caregiver it is normal to feel overwhelmed, fearful and uncertain.  It is important for patients and caregivers to seek emotional care and support to cope and help adjust to new roles and responsibilities.  Knowing what resources are available for both patients and their caregivers and taking advantage of them is important.  In addition to the information you received while in the hospital, online patient and caregiver resources are also available.  Visit the American Stroke Association (ASA) website at www.strokeassocation.org for more information.  If you do not have a computer or access to the internet, you may call the ASA at 1-800-4-STROKE to get more information.    Talking with someone who understands the impact of stroke can also be very helpful.  The Stroke Family Warmline is staffed by people who have personal experience with stroke.  It is available by calling 1-888-4-STROKE.    Local support groups are also available free of charge:    [facility] [phone] (4th Tuesday 12-1:30pm March - Dec)  [facility] - [address] [place] at [phone] (2nd Thursday 1pm @ [facility] - sponsored by [facility])  [facility] - [address] [place], [place] [address] - Delena Serve [phone] / Suszanne Conners [phone] (2nd Wed. 1:30pm @ [facility])  [facility] - [address] [place], [place] - [place] at [phone]  [facility] - [address], [place], [place]- Annabelle Harman or Tresa Endo [phone] (2nd Thursday [facility])  [place] [facility] - [place] Party [phone] (3rd Sunday 3pm Inpatient Rehab Dayroom)    You may find you need additional support to return to independent living and/or work.  The [facility] provides counseling, training, education, transportation, job placement, assistive technology, home modifications and other support services.  These services are provided to people with new physical and/or cognitive difficulties to assist them with living independently and/or returning to work. You can call the main number below and they will link you to your local office for an evaluation:       Main Office     [address]     [place], [place] [address]     Telephone: [phone]     Toll Free: 1-800-689-9090     [phone] (Videophone)     91 [phone] Gwenevere Abbot)        Vocational Rehabilitation Local Offices:       http://www.cox-reed.biz/         If you are a caregiver and feel like you are unable to care for your loved one, contact your healthcare provider for assistance.  You may also use the [facility] provider Locator for assistance with finding the right resources for your loved one.  Visit TampaConcert.co.nz for more information.  Caregiving is a tough job.  Asking for help is a sign of strength and you need to take the time to care for your needs as well as those of your loved one.  Seek respite care.  For resources in your area, search www.eldercare.gov or use the [employer] at [employer].WizardLinks.co.za.  You may also contact your healthcare provider for more information.    As a caregiver, if you or your loved one are experiencing signs of depression (little interest/pleasure in doing things, feeling down, depressed or hopeless),  contact your healthcare provider.  If you or your loved one are experiencing feelings of hurting self, call 9-1-1 immediately.      Discharge Services and Resources:    Outpatient Therapy appointments:    Outpatient Therapy has been arranged for you.     Physical Therapy: 07/25/22 9am (pls arrive at 8:45)  Occupational Therapy: 07/25/22 10:30    [facility]  [address]  [place], [place] [address]  [phone] opt1      If you have any questions regarding outpatient therapy, please call the number listed above.      Home Medical Equipment:     [facility] at [phone]     If you have any questions regarding your medical equipment after discharge, please call the above number.     Disability Parking Placard  You have received an application for a disability parking placard that you can obtain at your local [facility] for $5.
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
## 2022-07-05 NOTE — Unmapped (Signed)
Warfarin Therapeutic Monitoring Pharmacy Note    Warren [name] is a 47 y.o. male  re-starting  warfarin.     Indication: mechanical aortic valve replacement (On-X valve type)    Prior Dosing Information: Previous regimen warfarin 5 mg daily       Source(s) of information used to determine prior to admission dosing: Fill HIstory    Goals:  Therapeutic Drug Levels  INR range: 1.5-2 per cardiology    Additional Clinical Monitoring/Outcomes  Monitor hemoglobin and platelets  Monitor for signs and symptoms of bleeding  Monitor liver function (LFTs, bilirubin)    Results:  Lab Results   Component Value Date    INR 1.79 07/05/2022    INR 1.54 07/04/2022    INR 1.59 07/03/2022       Pharmacokinetic Considerations and Significant Drug Interactions:   Drug Interactions  see below table    Bridge Therapy  Enoxaparin (no longer on)    Concurrent Antiplatelet Medications  none identified    Assessment/Plan:  Recommendation(s)  INR is therapeutic   Continue current regimen of warfarin 5 mg daily  Discontinue enoxaparin bridge since INR within goal x 24 hours    Follow-up  INR to be obtained: daily with AM labs    A pharmacist will continue to monitor and recommend INRs/dose changes as appropriate    Longitudinal Dose Monitoring:  Date AM INR PM Dose (mg) Key Drug Interactions   6/13 1.79 5 mg N/A   6/12 1.54 5 mg N/A   6/11 1.59 5 mg Doxycycline (ends 6/11 AM)   6/10 1.56 5 mg Doxycycline   6/9 1.43 5 mg Doxycycline   6/8 1.08 5 mg Doxycycline   6/7 1.01 5 mg Doxycycline   6/6 0.97 5 mg Doxycycline   6/5 -- 5 mg Doxycycline, IM Penicillin G   6/4 0.96 --- ---     A pharmacist will continue to monitor the INR daily and adjust the warfarin dose in conjunction with the medical team as appropriate. Please page service pharmacist with questions/clarifications.    Fabio Asa, PharmD, BCPS

## 2022-07-05 NOTE — Unmapped (Signed)
SW received tobacco cessation consult. Pt not answering room phone after 3 attempts in the morning so SW called RN station to request assistance reaching pt. RN went to room and pt had disconnected his room phone. RN re-connected the phone at 2pm and informed pt that this writer is trying to reach him. Pt declined the call.    Venancio Poisson, LCSW, NCTTP  Clinical Social Worker / Tobacco Treatment Specialist  [facility]  Phone: [phone]

## 2022-07-05 NOTE — Unmapped (Signed)
Care Management  Initial Transition Planning Assessment   Per H and P -  Claro Finken. is a 47 y.o. 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 Now with right hemiplegia and expressive aphasia. He is now admitted to [facility] for comprehensive interdisciplinary rehabilitation.      Rehab Impairment Group Code [facility]):  (Stroke) 01.2 Right Body Involvement (Left Brain)     Pre-Team Conference Note: Discharge Date is set for TBD, plan to discuss patient tomorrow in team. CM met with patient to complete initial assessment.  Patient's discharge address and phone number verified as correct in demographics. Patient lives in a 2nd floor apartment has 12-13 stairs to that floor, no elevator access.  Pt lives alone and does not have 24/7 supervision at home.  Patient has the following DME in place at home, none.  Patient is currently not open to any Home Health agency.   Trey Paula is this patient's Insurance risk surveyor and will be picked up by Trey Paula at discharge.  Per patient, he does not want [place] listed as his emergency contact.  He would like Trey Paula listed [phone].  He has a PCP and Insurance. Care Manager provided patient and/or family with a [place] My Chart Brochure and review how to establish an account. CM will follow for discharge planning needs.       Type of Residence: Mailing Address:  [address]  [place] [address]  Contacts: Extended Emergency Contact Information  Primary Emergency Contact: Jiron,VEronica  Mobile Phone: [phone]  Relation: Other  Preferred language: ENGLISH  Interpreter needed? No  Secondary Emergency Contact: Duran,Chris  Mobile Phone: [phone]  Relation: None    Patient Phone Number: [phone] (home)           Medical Provider(s): Bobbie Stack, MD  Reason for Admission: Admitting Diagnosis:  Ischemic stroke  Past Medical History:   has a past medical history of AICD present, double chamber (07/21/2019), Anal warts (2020), Delusions (CMS-HCC) (2021), Endocarditis of aortic valve (02/2018), Endocarditis of prosthetic valve (CMS-HCC) (05/14/2019), 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  Past Surgical History:   has a past surgical history that includes Cardiac surgery (02/2018); Cardiac valve replacement (02/2018); and Cardiac pacemaker placement (03/20/2018).   Previous admit date: 06/26/2022    Primary Insurance- Payor: Advertising copywriter MEDICARE ADV / Plan: UNITED HEALTHCARE DUAL COMPLETE RP / Product Type: *No Product type* /   Secondary Insurance - None  Preferred Pharmacy - [id number] [place] [place] - [address] [facility] [employer] [facility]  [facility]                     General  Care Manager assessed the patient by : In person interview with patient, Medical record review, Discussion with Clinical Care team  Orientation Level: Oriented X4  Functional level prior to admission: Independent  Reason for referral: Discharge Planning  Contact/Decision Maker  Extended Emergency Contact Information  Primary Emergency Contact: [place],VEronica  Mobile Phone: [phone]  Relation: Other  Preferred language: ENGLISH  Interpreter needed? No  Secondary Emergency Contact: Duran,Chris  Mobile Phone: [phone]  Relation: None    Legal Next of Kin / Guardian / POA / Advance Directives     HCDM (patient stated preference): [place],VEronica - Other - [phone]    Advance Directive (Medical Treatment)  Does patient have an advance directive covering medical treatment?: Patient does not have advance directive covering medical treatment.  Reason patient does not have an advance directive covering medical treatment:: Patient does not wish to complete one at this time.    Health Care Decision Maker [HCDM] (Medical & Mental Health Treatment)  Healthcare Decision Maker: Patient does not wish to appoint a Health Care Decision Maker at this time  Information offered on HCDM, Medical & Mental Health advance directives:: Patient declined information.    Advance Directive (Mental Health Treatment)  Does patient have an advance directive covering mental health treatment?: Patient does not have advance directive covering mental health treatment.  Reason patient does not have an advance directive covering mental health treatment:: Patient does not wish to complete one at this time.    Readmission Information    Have you been hospitalized in the last 30 days?: Yes  Name of Hospital: [facility]  Were you being cared for at a skilled nursing facility:: No     What day were you discharged from that hospital or facility?: 07/04/22  Number of Days between previous discharge and readmission date: 1-3 days    Type of Readmission: Planned Readmission    Patient Information  Lives with: Alone    Type of Residence: Private residence  Location/Detail: [address] [place], [place]    Support Systems/Concerns: Friends/Neighbors    Responsibilities/Dependents at home?: No    Home Care services in place prior to admission?: No    Outpatient/Community Resources in place prior to admission: Clinic  Agency detail (Name/Phone #): (PCP) Lala Lund    Equipment Currently Used at Home: none    Currently receiving outpatient dialysis?: No    Financial Information    Need for financial assistance?: No    Social Determinants of Health  Social Determinants of Health were addressed in provider documentation.  Please refer to patient history.  Social Determinants of Health     Financial Resource Strain: Low Risk  (07/04/2022)    Overall Financial Resource Strain (CARDIA)     Difficulty of Paying Living Expenses: Not hard at all   Internet Connectivity: Internet connectivity concern identified (07/04/2022)    Internet Connectivity     Do you have access to internet services: No     How do you connect to the internet: Personal Device at home     Is your internet connection strong enough for you to watch video on your device without major problems?: Yes     Do you have enough data to get through the month?: Yes     Does at least one of the devices have a camera that you can use for video chat?: Yes   Food Insecurity: No Food Insecurity (07/04/2022)    Hunger Vital Sign     Worried About Running Out of Food in the Last Year: Never true     Ran Out of Food in the Last Year: Never true   Tobacco Use: High Risk (06/27/2022)    Patient History  Smoking Tobacco Use: Every Day     Smokeless Tobacco Use: Never     Passive Exposure: Not on file   Housing/Utilities: Low Risk  (07/04/2022)    Housing/Utilities     Within the past 12 months, have you ever stayed: outside, in a car, in a tent, in an overnight shelter, or temporarily in someone else's home (i.e. couch-surfing)?: No     Are you worried about losing your housing?: No     Within the past 12 months, have you been unable to get utilities (heat, electricity) when it was really needed?: No   Alcohol Use: Not At Risk (03/07/2022)    Alcohol Use     How often do you have a drink containing alcohol?: 2 - 3 times per week     How many drinks containing alcohol do you have on a typical day when you are drinking?: 3 - 4     How often do you have 5 or more drinks on one occasion?: Never   Transportation Needs: No Transportation Needs (07/04/2022)    PRAPARE - Transportation     Lack of Transportation (Medical): No     Lack of Transportation (Non-Medical): No   Substance Use: High Risk (07/04/2022)    Substance Use     Taken prescription drugs for non-medical reasons: Never     Taken illegal drugs: Monthly     Patient indicated they have taken drugs in the past year for non-medical reasons: Yes, [positive answer(s)]: Yes   Health Literacy: Low Risk  (07/04/2022)    Health Literacy     : Never   Physical Activity: Inactive (07/04/2022)    Exercise Vital Sign     Days of Exercise per Week: 0 days     Minutes of Exercise per Session: 0 min   Interpersonal Safety: Not at risk (07/04/2022)    Interpersonal Safety     Unsafe Where You Currently Live: No     Physically Hurt by Anyone: No     Abused by Anyone: No   Stress: Stress Concern Present (07/04/2022)    [employer] of Occupational Health - Occupational Stress Questionnaire     Feeling of Stress : To some extent   Intimate Partner Violence: Not At Risk (07/04/2022)    Humiliation, Afraid, Rape, and Kick questionnaire     Fear of Current or Ex-Partner: No     Emotionally Abused: No     Physically Abused: No     Sexually Abused: No   Depression: Not at risk (07/04/2022)    PHQ-2     PHQ-2 Score: 0   Social Connections: Moderately Isolated (07/04/2022)    Social Connection and Isolation Panel [NHANES]     Frequency of Communication with Friends and Family: Once a week     Frequency of Social Gatherings with Friends and Family: Once a week     Attends Religious Services: 1 to 4 times per year     Active Member of [employer] or Organizations: Yes     Attends Banker Meetings: Never     Marital Status: Never married       Complex Discharge Information    Is patient identified as a difficult/complex discharge?: No    Discharge Needs Assessment  Concerns to be Addressed: discharge planning    Clinical Risk Factors: New Diagnosis, Principal Diagnosis: Cancer, Stroke, COPD, Heart Failure, AMI, Pneumonia, Joint Replacment, Functional Limitations, Readmission < 72 Hours    Barriers to taking medications:  No ([facility])    Prior overnight hospital stay or ED visit in last 90 days: Yes    Anticipated Changes Related to Illness: none    Equipment Needed After Discharge: other (see comments) (TBD)    Discharge Facility/Level of Care Needs: other (see comments) (TBD)    Readmission  Risk of Unplanned Readmission Score: UNPLANNED READMISSION SCORE: 12.7%  Predictive Model Details          13% (Medium)  Factor Value    Calculated 07/05/2022 08:05 15% Number of ED visits in last six months 2    [place] Risk of Unplanned Readmission Model 15% Number of active inpatient medication orders 16     13% Active antipsychotic inpatient medication order present     13% ECG/EKG order present in last 6 months     9% Imaging order present in last 6 months     8% Phosphorous result present     8% Charlson Comorbidity Index 5     7% Number of hospitalizations in last year 1     6% Active anticoagulant inpatient medication order present     5% Age 10     3% Future appointment scheduled     1% Current length of stay 0.677 days      Readmitted Within the Last 30 Days? (No if blank) Yes  Patient at risk for readmission?: Yes    Discharge Plan  Screen findings are: Discharge planning needs identified or anticipated (Comment).    Expected Discharge Date:     Expected Transfer from Critical Care:  (N/A)    Quality data for continuing care services shared with patient and/or representative?: Yes  Patient and/or family were provided with choice of facilities / services that are available and appropriate to meet post hospital care needs?: Yes   List choices in order highest to lowest preferred, if applicable. : Will discuss with patient once needs are know.  However, currently there is no preference.    Initial Assessment complete?: Yes
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
## 2022-07-05 NOTE — Unmapped (Signed)
Pt is A&O x4. Denies pain. Continent of bowel and bladder. No new skin issues noted. Participated in all therapy evaluations this shift. Safety protocols maintained, call bell within reach.   Problem: Rehabilitation (IRF) Plan of Care  Goal: Plan of Care Review  Outcome: Progressing  Goal: Patient-Specific Goal (Individualized)  Outcome: Progressing  Goal: Absence of New-Onset Illness or Injury  Outcome: Progressing  Intervention: Prevent Fall and Fall Injury  Recent Flowsheet Documentation  Taken 07/05/2022 1600 by Dawna Part, RN  Safety Interventions:   fall reduction program maintained   low bed   nonskid shoes/slippers when out of bed  Taken 07/05/2022 1400 by Dawna Part, RN  Safety Interventions:   fall reduction program maintained   low bed   bed alarm   nonskid shoes/slippers when out of bed  Taken 07/05/2022 1200 by Dawna Part, RN  Safety Interventions:   fall reduction program maintained   low bed   nonskid shoes/slippers when out of bed  Taken 07/05/2022 1000 by Dawna Part, RN  Safety Interventions:   fall reduction program maintained   low bed   nonskid shoes/slippers when out of bed  Taken 07/05/2022 0800 by Dawna Part, RN  Safety Interventions:   bed alarm   fall reduction program maintained   low bed   nonskid shoes/slippers when out of bed  Intervention: Prevent Skin Injury  Recent Flowsheet Documentation  Taken 07/05/2022 1600 by Dawna Part, RN  Device Skin Pressure Protection: adhesive use limited  Skin Protection: adhesive use limited  Taken 07/05/2022 1400 by Dawna Part, RN  Device Skin Pressure Protection:   absorbent pad utilized/changed   adhesive use limited  Skin Protection:   adhesive use limited   incontinence pads utilized  Taken 07/05/2022 1200 by Dawna Part, RN  Device Skin Pressure Protection:   absorbent pad utilized/changed   adhesive use limited  Skin Protection:   adhesive use limited   incontinence pads utilized  Taken 07/05/2022 1000 by Dawna Part, RN  Device Skin Pressure Protection: adhesive use limited  Skin Protection: adhesive use limited  Taken 07/05/2022 0800 by Dawna Part, RN  Device Skin Pressure Protection: adhesive use limited  Skin Protection: adhesive use limited  Intervention: Prevent VTE (Venous Thromboembolism)  Recent Flowsheet Documentation  Taken 07/05/2022 1000 by Dawna Part, RN  VTE Prevention/Management:   anticoagulant therapy   fluids promoted  Goal: Optimal Comfort and Wellbeing  Outcome: Progressing  Goal: Home and Community Transition Plan Established  Outcome: Progressing  Goal: Rounds/Family Conference  Outcome: Progressing     Problem: Self-Care Deficit  Goal: Improved Ability to Complete Activities of Daily Living  Outcome: Progressing     Problem: Fall Injury Risk  Goal: Absence of Fall and Fall-Related Injury  Outcome: Progressing  Intervention: Promote Injury-Free Environment  Recent Flowsheet Documentation  Taken 07/05/2022 1600 by Dawna Part, RN  Safety Interventions:   fall reduction program maintained   low bed   nonskid shoes/slippers when out of bed  Taken 07/05/2022 1400 by Dawna Part, RN  Safety Interventions:   fall reduction program maintained   low bed   bed alarm   nonskid shoes/slippers when out of bed  Taken 07/05/2022 1200 by Dawna Part, RN  Safety Interventions:   fall reduction program maintained   low bed   nonskid shoes/slippers when out of bed  Taken 07/05/2022 1000 by Dawna Part, RN  Safety Interventions:   fall reduction program maintained   low bed   nonskid shoes/slippers when out of  bed  Taken 07/05/2022 0800 by Dawna Part, RN  Safety Interventions:   bed alarm   fall reduction program maintained   low bed   nonskid shoes/slippers when out of bed     Problem: Skin Injury Risk Increased  Goal: Skin Health and Integrity  Outcome: Progressing  Intervention: Optimize Skin Protection  Recent Flowsheet Documentation  Taken 07/05/2022 1600 by Dawna Part, RN  Pressure Reduction Techniques: frequent weight shift encouraged  Pressure Reduction Devices:   positioning supports utilized   pressure-redistributing mattress utilized  Skin Protection: adhesive use limited  Taken 07/05/2022 1400 by Dawna Part, RN  Pressure Reduction Techniques: frequent weight shift encouraged  Pressure Reduction Devices:   positioning supports utilized   pressure-redistributing mattress utilized  Skin Protection:   adhesive use limited   incontinence pads utilized  Taken 07/05/2022 1200 by Dawna Part, RN  Pressure Reduction Techniques: frequent weight shift encouraged  Pressure Reduction Devices:   positioning supports utilized   pressure-redistributing mattress utilized  Skin Protection:   adhesive use limited   incontinence pads utilized  Taken 07/05/2022 1000 by Dawna Part, RN  Pressure Reduction Techniques: frequent weight shift encouraged  Pressure Reduction Devices:   positioning supports utilized   pressure-redistributing mattress utilized  Skin Protection: adhesive use limited  Taken 07/05/2022 0800 by Dawna Part, RN  Pressure Reduction Techniques: frequent weight shift encouraged  Pressure Reduction Devices:   positioning supports utilized   pressure-redistributing mattress utilized  Skin Protection: adhesive use limited

## 2022-07-05 NOTE — Unmapped (Signed)
Patient arrived axox4,RA, right side weakness. Takes meds whole. No c/o pain, telemetry monitor on. Continent B&B. Not evaluated by PT or OT today as late arrival via EMS. Patient is able to turn himself. No skin issues. No additional educational needs at this time.   Problem: Rehabilitation (IRF) Plan of Care  Goal: Plan of Care Review  07/04/2022 1807 by Olena Leatherwood, RN  Outcome: Ongoing - Unchanged  Flowsheets (Taken 07/04/2022 1807)  Progress: no change  Plan of Care Reviewed With: patient  07/04/2022 1807 by Olena Leatherwood, RN  Outcome: Ongoing - Unchanged  Flowsheets (Taken 07/04/2022 1807)  Progress: no change  Plan of Care Reviewed With: patient  Goal: Patient-Specific Goal (Individualized)  07/04/2022 1807 by Olena Leatherwood, RN  Outcome: Ongoing - Unchanged  07/04/2022 1807 by Olena Leatherwood, RN  Outcome: Ongoing - Unchanged  Goal: Absence of New-Onset Illness or Injury  07/04/2022 1807 by Olena Leatherwood, RN  Outcome: Ongoing - Unchanged  07/04/2022 1807 by Olena Leatherwood, RN  Outcome: Ongoing - Unchanged  Goal: Optimal Comfort and Wellbeing  07/04/2022 1807 by Olena Leatherwood, RN  Outcome: Ongoing - Unchanged  07/04/2022 1807 by Olena Leatherwood, RN  Outcome: Ongoing - Unchanged  Goal: Home and Community Transition Plan Established  07/04/2022 1807 by Olena Leatherwood, RN  Outcome: Ongoing - Unchanged  07/04/2022 1807 by Olena Leatherwood, RN  Outcome: Ongoing - Unchanged  Goal: Rounds/Family Conference  07/04/2022 1807 by Olena Leatherwood, RN  Outcome: Ongoing - Unchanged  07/04/2022 1807 by Olena Leatherwood, RN  Outcome: Ongoing - Unchanged     Problem: Self-Care Deficit  Goal: Improved Ability to Complete Activities of Daily Living  07/04/2022 1807 by Olena Leatherwood, RN  Outcome: Ongoing - Unchanged  07/04/2022 1807 by Olena Leatherwood, RN  Outcome: Ongoing - Unchanged     Problem: Fall Injury Risk  Goal: Absence of Fall and Fall-Related Injury  07/04/2022 1807 by Olena Leatherwood, RN  Outcome: Ongoing - Unchanged  07/04/2022 1807 by Olena Leatherwood, RN  Outcome: Ongoing - Unchanged     Problem: Skin Injury Risk Increased  Goal: Skin Health and Integrity  07/04/2022 1807 by Olena Leatherwood, RN  Outcome: Ongoing - Unchanged  07/04/2022 1807 by Olena Leatherwood, RN  Outcome: Ongoing - Unchanged  Intervention: Optimize Skin Protection  Recent Flowsheet Documentation  Taken 07/04/2022 1729 by Olena Leatherwood, RN  Pressure Reduction Techniques: frequent weight shift encouraged
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
## 2022-07-05 NOTE — Unmapped (Addendum)
Patient is alert and oriented X4. No complaints of pain, able to turn himself in bed. All safety and skin care measures maintained. Will continue to monitor   Problem: Rehabilitation (IRF) Plan of Care  Goal: Plan of Care Review  Outcome: Ongoing - Unchanged  Goal: Patient-Specific Goal (Individualized)  Outcome: Ongoing - Unchanged  Goal: Absence of New-Onset Illness or Injury  Outcome: Ongoing - Unchanged  Intervention: Prevent Fall and Fall Injury  Recent Flowsheet Documentation  Taken 07/04/2022 2200 by Roderick Pee, RN  Safety Interventions: fall reduction program maintained  Taken 07/04/2022 1930 by Roderick Pee, RN  Safety Interventions: fall reduction program maintained  Goal: Optimal Comfort and Wellbeing  Outcome: Ongoing - Unchanged  Goal: Home and Community Transition Plan Established  Outcome: Ongoing - Unchanged  Goal: Rounds/Family Conference  Outcome: Ongoing - Unchanged     Problem: Self-Care Deficit  Goal: Improved Ability to Complete Activities of Daily Living  Outcome: Ongoing - Unchanged     Problem: Fall Injury Risk  Goal: Absence of Fall and Fall-Related Injury  Outcome: Ongoing - Unchanged  Intervention: Promote Injury-Free Environment  Recent Flowsheet Documentation  Taken 07/04/2022 2200 by Roderick Pee, RN  Safety Interventions: fall reduction program maintained  Taken 07/04/2022 1930 by Roderick Pee, RN  Safety Interventions: fall reduction program maintained     Problem: Skin Injury Risk Increased  Goal: Skin Health and Integrity  Outcome: Ongoing - Unchanged  Intervention: Optimize Skin Protection  Recent Flowsheet Documentation  Taken 07/04/2022 2200 by Roderick Pee, RN  Pressure Reduction Techniques: frequent weight shift encouraged  Pressure Reduction Devices: positioning supports utilized  Taken 07/04/2022 1930 by Roderick Pee, RN  Pressure Reduction Techniques: frequent weight shift encouraged  Head of Bed (HOB) Positioning: HOB elevated  Pressure Reduction Devices: positioning supports utilized

## 2022-07-06 LAB — PROTIME-INR
INR: 1.71
PROTIME: 18.7 s — ABNORMAL HIGH (ref 9.9–12.6)

## 2022-07-06 MED ADMIN — bictegrav-emtricit-tenofov ala (BIKTARVY) 50-200-25 mg tablet 1 tablet: 1 | ORAL | @ 14:00:00

## 2022-07-06 MED ADMIN — risperiDONE (RisperDAL) tablet 0.5 mg: .5 mg | ORAL | @ 03:00:00

## 2022-07-06 MED ADMIN — nicotine (NICODERM CQ) 14 mg/24 hr patch 1 patch: 1 | TRANSDERMAL | @ 13:00:00

## 2022-07-06 MED ADMIN — atorvastatin (LIPITOR) tablet 80 mg: 80 mg | ORAL | @ 14:00:00

## 2022-07-06 MED ADMIN — aspirin chewable tablet 81 mg: 81 mg | ORAL | @ 14:00:00

## 2022-07-06 MED ADMIN — warfarin (JANTOVEN) tablet 5 mg: 5 mg | ORAL | @ 22:00:00
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
## 2022-07-07 LAB — PROTIME-INR
INR: 1.62
PROTIME: 17.7 s — ABNORMAL HIGH (ref 9.9–12.6)

## 2022-07-07 MED ADMIN — melatonin tablet 3 mg: 3 mg | ORAL

## 2022-07-07 MED ADMIN — bictegrav-emtricit-tenofov ala (BIKTARVY) 50-200-25 mg tablet 1 tablet: 1 | ORAL | @ 13:00:00

## 2022-07-07 MED ADMIN — aspirin chewable tablet 81 mg: 81 mg | ORAL | @ 13:00:00

## 2022-07-07 MED ADMIN — warfarin (JANTOVEN) tablet 5 mg: 5 mg | ORAL | @ 22:00:00

## 2022-07-07 MED ADMIN — atorvastatin (LIPITOR) tablet 80 mg: 80 mg | ORAL | @ 13:00:00

## 2022-07-07 MED ADMIN — risperiDONE (RisperDAL) tablet 0.5 mg: .5 mg | ORAL

## 2022-07-07 MED ADMIN — metoPROLOL succinate (Toprol-XL) 24 hr tablet 50 mg: 50 mg | ORAL | @ 13:00:00

## 2022-07-07 MED ADMIN — magnesium hydroxide (MILK OF MAGNESIA) oral suspension: 30 mL | ORAL | @ 03:00:00

## 2022-07-07 MED ADMIN — nicotine (NICODERM CQ) 14 mg/24 hr patch 1 patch: 1 | TRANSDERMAL | @ 13:00:00
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
## 2022-07-07 NOTE — Unmapped (Signed)
Warfarin Therapeutic Monitoring Pharmacy Note    Warren [name] is a 47 y.o. male  re-starting  warfarin.     Indication: mechanical aortic valve replacement (On-X valve type)    Prior Dosing Information: Current regimen 5 mg daily       Source(s) of information used to determine prior to admission dosing: Fill HIstory    Goals:  Therapeutic Drug Levels  INR range: 1.5-2 per cardiology    Additional Clinical Monitoring/Outcomes  Monitor hemoglobin and platelets  Monitor for signs and symptoms of bleeding  Monitor liver function (LFTs, bilirubin)    Results:  Lab Results   Component Value Date    INR 1.62 07/07/2022    INR 1.71 07/06/2022    INR 1.79 07/05/2022       Pharmacokinetic Considerations and Significant Drug Interactions:   Drug Interactions  see below table    Bridge Therapy  Enoxaparin (no longer on)    Concurrent Antiplatelet Medications  none identified    Assessment/Plan:  Recommendation(s)  INR is therapeutic   Continue current regimen of warfarin 5 mg daily    Follow-up  INR to be obtained: daily with AM labs    A pharmacist will continue to monitor and recommend INRs/dose changes as appropriate    Longitudinal Dose Monitoring:  Date AM INR PM Dose (mg) Key Drug Interactions   6/15 1.62 5 mg N/A   6/14 1.71 5 mg N/A   6/13 1.79 5 mg N/A   6/12 1.54 5 mg N/A   6/11 1.59 5 mg Doxycycline (ends 6/11 AM)   6/10 1.56 5 mg Doxycycline   6/9 1.43 5 mg Doxycycline   6/8 1.08 5 mg Doxycycline   6/7 1.01 5 mg Doxycycline   6/6 0.97 5 mg Doxycycline   6/5 -- 5 mg Doxycycline, IM Penicillin G   6/4 0.96 --- ---     A pharmacist will continue to monitor the INR daily and adjust the warfarin dose in conjunction with the medical team as appropriate. Please page service pharmacist with questions/clarifications.    Lurline Del, PharmD  PGY-2 Pharmacy Resident

## 2022-07-07 NOTE — Unmapped (Signed)
Patient is alert and oriented x4. Continent of bladder and bowel. No c/o pain. Skin remains intact, no new skin issues noted, pt self turns. Bed alarm remains on. R sided weakness. Aspiration, safety and fall precautions maintained. Call bell and belongings within reach, bed low and locked. Will continue to monitor.      Problem: Rehabilitation (IRF) Plan of Care  Goal: Plan of Care Review  Outcome: Progressing  Goal: Patient-Specific Goal (Individualized)  Outcome: Progressing  Goal: Absence of New-Onset Illness or Injury  Outcome: Progressing  Goal: Optimal Comfort and Wellbeing  Outcome: Progressing  Goal: Home and Community Transition Plan Established  Outcome: Progressing  Goal: Rounds/Family Conference  Outcome: Progressing     Problem: Self-Care Deficit  Goal: Improved Ability to Complete Activities of Daily Living  Outcome: Progressing     Problem: Fall Injury Risk  Goal: Absence of Fall and Fall-Related Injury  Outcome: Progressing     Problem: Skin Injury Risk Increased  Goal: Skin Health and Integrity  Outcome: Progressing     Problem: Mobility Impairment  Goal: Optimal Mobility  Outcome: Progressing

## 2022-07-07 NOTE — Unmapped (Signed)
Alert and oriented x 4. Poor proprioception. Often found leaning on left side rails. Needs assist to recenter in bed. Right leg flaccid. Left arm weak. Cont of urine. Skin intact. No falls in past 24 hours. Bed alarm on at all times. Skin intact. Pleasant demeanor. Awake most of the night.good appetite, calls for snacks late at night. Encouraged to order food from [facility] for evening. May benefit from supplements. Will inform NFS and MD.           Problem: Rehabilitation (IRF) Plan of Care  Goal: Plan of Care Review  Outcome: Progressing  Goal: Patient-Specific Goal (Individualized)  Outcome: Progressing  Goal: Absence of New-Onset Illness or Injury  Outcome: Progressing  Intervention: Prevent Fall and Fall Injury  Recent Flowsheet Documentation  Taken 07/07/2022 0200 by Boone Master, RN  Safety Interventions: fall reduction program maintained  Taken 07/07/2022 0000 by Boone Master, RN  Safety Interventions: fall reduction program maintained  Taken 07/06/2022 2200 by Boone Master, RN  Safety Interventions: fall reduction program maintained  Taken 07/06/2022 1930 by Boone Master, RN  Safety Interventions: fall reduction program maintained  Intervention: Prevent VTE (Venous Thromboembolism)  Recent Flowsheet Documentation  Taken 07/06/2022 2200 by Boone Master, RN  VTE Prevention/Management: anticoagulant therapy  Goal: Optimal Comfort and Wellbeing  Outcome: Progressing  Goal: Home and Community Transition Plan Established  Outcome: Progressing  Goal: Rounds/Family Conference  Outcome: Progressing     Problem: Self-Care Deficit  Goal: Improved Ability to Complete Activities of Daily Living  Outcome: Progressing     Problem: Mobility Impairment  Goal: Optimal Mobility  Outcome: Progressing  Intervention: Optimize Mobility  Recent Flowsheet Documentation  Taken 07/07/2022 0200 by Boone Master, RN  Positioning/Transfer Devices: pillows  Taken 07/07/2022 0000 by Boone Master, RN  Positioning/Transfer Devices: pillows  Taken 07/06/2022 2200 by Boone Master, RN  Positioning/Transfer Devices: pillows  Taken 07/06/2022 1930 by Boone Master, RN  Positioning/Transfer Devices: pillows

## 2022-07-08 LAB — PROTIME-INR
INR: 1.69
PROTIME: 18.5 s — ABNORMAL HIGH (ref 9.9–12.6)

## 2022-07-08 MED ADMIN — risperiDONE (RisperDAL) tablet 0.5 mg: .5 mg | ORAL | @ 01:00:00

## 2022-07-08 MED ADMIN — nicotine (NICODERM CQ) 14 mg/24 hr patch 1 patch: 1 | TRANSDERMAL | @ 13:00:00

## 2022-07-08 MED ADMIN — atorvastatin (LIPITOR) tablet 80 mg: 80 mg | ORAL | @ 13:00:00

## 2022-07-08 MED ADMIN — bictegrav-emtricit-tenofov ala (BIKTARVY) 50-200-25 mg tablet 1 tablet: 1 | ORAL | @ 13:00:00

## 2022-07-08 MED ADMIN — warfarin (JANTOVEN) tablet 5 mg: 5 mg | ORAL | @ 22:00:00

## 2022-07-08 MED ADMIN — aspirin chewable tablet 81 mg: 81 mg | ORAL | @ 13:00:00

## 2022-07-08 NOTE — Unmapped (Signed)
Pat remains afebrile and VS within normal limits. No c/o of shortness of breath. Denies pain in the upper/lower extremity, no swelling in legs/arms. Bed in low position, call bell in reach, side rails up x 4  and bed alarm. Call light in reach. Weight shifted regular. Warren [name] is sleeping often throughout the night to this time. No injuries or fall reported to this time. Bed in low position. Warren [name] remains alert and able to follow commands. Pat voided in urinal multiple times and consumed appropriate food. Neuro checks done and no changes. No pressure injury noted or reported. Medications given per order. Will continue to monitor.    Problem: Rehabilitation (IRF) Plan of Care  Goal: Plan of Care Review  Outcome: Progressing  Flowsheets (Taken 07/08/2022 0344)  Progress: improving  Plan of Care Reviewed With: patient  Goal: Patient-Specific Goal (Individualized)  Outcome: Progressing  Goal: Absence of New-Onset Illness or Injury  Outcome: Progressing  Intervention: Prevent Fall and Fall Injury  Recent Flowsheet Documentation  Taken 07/08/2022 0200 by Herminio Heads, RN  Safety Interventions:   bed alarm   low bed   fall reduction program maintained   nonskid shoes/slippers when out of bed   bleeding precautions  Taken 07/08/2022 0015 by Herminio Heads, RN  Safety Interventions:   bed alarm   low bed   fall reduction program maintained   bleeding precautions   nonskid shoes/slippers when out of bed  Taken 07/07/2022 2200 by Herminio Heads, RN  Safety Interventions:   bed alarm   bleeding precautions   low bed   fall reduction program maintained   nonskid shoes/slippers when out of bed  Taken 07/07/2022 2030 by Herminio Heads, RN  Safety Interventions:   bed alarm   low bed   fall reduction program maintained   nonskid shoes/slippers when out of bed  Intervention: Prevent Skin Injury  Recent Flowsheet Documentation  Taken 07/08/2022 0200 by Herminio Heads, RN  Skin Protection: incontinence pads utilized  Taken 07/08/2022 0015 by Herminio Heads, RN  Skin Protection: incontinence pads utilized  Intervention: Prevent Infection  Recent Flowsheet Documentation  Taken 07/08/2022 0200 by Herminio Heads, RN  Infection Prevention: rest/sleep promoted  Taken 07/08/2022 0015 by Herminio Heads, RN  Infection Prevention: rest/sleep promoted  Goal: Optimal Comfort and Wellbeing  Outcome: Progressing  Goal: Home and Community Transition Plan Established  Outcome: Progressing  Goal: Rounds/Family Conference  Outcome: Progressing     Problem: Self-Care Deficit  Goal: Improved Ability to Complete Activities of Daily Living  Outcome: Progressing     Problem: Fall Injury Risk  Goal: Absence of Fall and Fall-Related Injury  Outcome: Progressing  Intervention: Promote Injury-Free Environment  Recent Flowsheet Documentation  Taken 07/08/2022 0200 by Herminio Heads, RN  Safety Interventions:   bed alarm   low bed   fall reduction program maintained   nonskid shoes/slippers when out of bed   bleeding precautions  Taken 07/08/2022 0015 by Herminio Heads, RN  Safety Interventions:   bed alarm   low bed   fall reduction program maintained   bleeding precautions   nonskid shoes/slippers when out of bed  Taken 07/07/2022 2200 by Herminio Heads, RN  Safety Interventions:   bed alarm   bleeding precautions   low bed   fall reduction program maintained   nonskid shoes/slippers when out of bed  Taken 07/07/2022 2030 by Herminio Heads, RN  Safety Interventions:   bed alarm   low bed   fall reduction program maintained   nonskid  shoes/slippers when out of bed     Problem: Skin Injury Risk Increased  Goal: Skin Health and Integrity  Outcome: Progressing  Intervention: Optimize Skin Protection  Recent Flowsheet Documentation  Taken 07/08/2022 0200 by Herminio Heads, RN  Pressure Reduction Techniques: frequent weight shift encouraged  Head of Bed (HOB) Positioning: HOB at 30 degrees  Skin Protection: incontinence pads utilized  Taken 07/08/2022 0015 by Herminio Heads, RN  Pressure Reduction Techniques:   frequent weight shift encouraged   heels elevated off bed  Head of Bed (HOB) Positioning: HOB at 30 degrees  Skin Protection: incontinence pads utilized  Taken 07/07/2022 2200 by Herminio Heads, RN  Pressure Reduction Techniques: heels elevated off bed  Head of Bed [facility]) Positioning: HOB at 30 degrees  Taken 07/07/2022 2030 by Herminio Heads, RN  Head of Bed [facility]) Positioning: HOB at 30 degrees  Taken 07/07/2022 2005 by Herminio Heads, RN  Pressure Reduction Techniques: frequent weight shift encouraged     Problem: Mobility Impairment  Goal: Optimal Mobility  Outcome: Progressing  Intervention: Optimize Mobility  Recent Flowsheet Documentation  Taken 07/07/2022 2200 by Herminio Heads, RN  Positioning/Transfer Devices:   pillows   in use  Taken 07/07/2022 2005 by Herminio Heads, RN  Positioning/Transfer Devices:   pillows   in use

## 2022-07-08 NOTE — Unmapped (Signed)
Warfarin Therapeutic Monitoring Pharmacy Note    Warren Warren [name] is a 47 y.o. male  re-starting  warfarin.     Indication: mechanical aortic valve replacement (On-X valve type)    Prior Dosing Information: Current regimen 5 mg Warren [name]       Source(s) of information used to determine prior to admission dosing: Fill HIstory    Goals:  Therapeutic Drug Levels  INR range: 1.5-2 per cardiology    Additional Clinical Monitoring/Outcomes  Monitor hemoglobin and platelets  Monitor for signs and symptoms of bleeding  Monitor liver function (LFTs, bilirubin)    Results:  Lab Results   Component Value Date    INR 1.69 07/08/2022    INR 1.62 07/07/2022    INR 1.71 07/06/2022       Pharmacokinetic Considerations and Significant Drug Interactions:   Drug Interactions  see below table    Bridge Therapy  Enoxaparin (no longer on)    Concurrent Antiplatelet Medications  none identified    Assessment/Plan:  Recommendation(s)  INR is therapeutic   Continue current regimen of warfarin 5 mg Warren [name]    Follow-up  INR to be obtained: Warren [name] with AM labs    A pharmacist will continue to monitor and recommend INRs/dose changes as appropriate    Longitudinal Dose Monitoring:  Date AM INR PM Dose (mg) Key Drug Interactions   6/16 1.69 5 mg N/A   6/15 1.62 5 mg N/A   6/14 1.71 5 mg N/A   6/13 1.79 5 mg N/A   6/12 1.54 5 mg N/A   6/11 1.59 5 mg Doxycycline (ends 6/11 AM)   6/10 1.56 5 mg Doxycycline   6/9 1.43 5 mg Doxycycline   6/8 1.08 5 mg Doxycycline   6/7 1.01 5 mg Doxycycline   6/6 0.97 5 mg Doxycycline   6/5 -- 5 mg Doxycycline, IM Penicillin G   6/4 0.96 --- ---     A pharmacist will continue to monitor the INR Warren [name] and adjust the warfarin dose in conjunction with the medical team as appropriate. Please page service pharmacist with questions/clarifications.    Lurline Del, PharmD  PGY-2 Pharmacy Resident
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
## 2022-07-09 DIAGNOSIS — B20 Human immunodeficiency virus [HIV] disease: Principal | ICD-10-CM

## 2022-07-09 DIAGNOSIS — F29 Unspecified psychosis not due to a substance or known physiological condition: Principal | ICD-10-CM

## 2022-07-09 DIAGNOSIS — R410 Disorientation, unspecified: Principal | ICD-10-CM

## 2022-07-09 DIAGNOSIS — F1910 Other psychoactive substance abuse, uncomplicated: Principal | ICD-10-CM

## 2022-07-09 LAB — CBC
HEMATOCRIT: 46 % (ref 39.0–48.0)
HEMOGLOBIN: 15.3 g/dL (ref 12.9–16.5)
MEAN CORPUSCULAR HEMOGLOBIN CONC: 33.4 g/dL (ref 32.0–36.0)
MEAN CORPUSCULAR HEMOGLOBIN: 29.5 pg (ref 25.9–32.4)
MEAN CORPUSCULAR VOLUME: 88.5 fL (ref 77.6–95.7)
MEAN PLATELET VOLUME: 7.4 fL (ref 6.8–10.7)
PLATELET COUNT: 303 10*9/L (ref 150–450)
RED BLOOD CELL COUNT: 5.19 10*12/L (ref 4.26–5.60)
RED CELL DISTRIBUTION WIDTH: 12.5 % (ref 12.2–15.2)
WBC ADJUSTED: 5.2 10*9/L (ref 3.6–11.2)

## 2022-07-09 LAB — BASIC METABOLIC PANEL
ANION GAP: 4 mmol/L — ABNORMAL LOW (ref 5–14)
BLOOD UREA NITROGEN: 8 mg/dL — ABNORMAL LOW (ref 9–23)
BUN / CREAT RATIO: 7
CALCIUM: 9.3 mg/dL (ref 8.7–10.4)
CHLORIDE: 109 mmol/L — ABNORMAL HIGH (ref 98–107)
CO2: 27.1 mmol/L (ref 20.0–31.0)
CREATININE: 1.07 mg/dL
EGFR CKD-EPI (2021) MALE: 88 mL/min/{1.73_m2} (ref >=60)
GLUCOSE RANDOM: 89 mg/dL (ref 70–179)
POTASSIUM: 4.7 mmol/L (ref 3.4–4.8)
SODIUM: 140 mmol/L (ref 135–145)

## 2022-07-09 LAB — PROTIME-INR
INR: 2.04
PROTIME: 22.2 s — ABNORMAL HIGH (ref 9.9–12.6)

## 2022-07-09 LAB — PHOSPHORUS: PHOSPHORUS: 3.2 mg/dL (ref 2.4–5.1)

## 2022-07-09 LAB — MAGNESIUM: MAGNESIUM: 2 mg/dL (ref 1.6–2.6)

## 2022-07-09 MED ADMIN — warfarin (JANTOVEN) tablet 5 mg: 5 mg | ORAL | @ 22:00:00

## 2022-07-09 MED ADMIN — aspirin chewable tablet 81 mg: 81 mg | ORAL | @ 13:00:00

## 2022-07-09 MED ADMIN — nicotine (NICODERM CQ) 14 mg/24 hr patch 1 patch: 1 | TRANSDERMAL | @ 13:00:00

## 2022-07-09 MED ADMIN — atorvastatin (LIPITOR) tablet 80 mg: 80 mg | ORAL | @ 13:00:00

## 2022-07-09 MED ADMIN — bictegrav-emtricit-tenofov ala (BIKTARVY) 50-200-25 mg tablet 1 tablet: 1 | ORAL | @ 13:00:00

## 2022-07-09 MED ADMIN — risperiDONE (RisperDAL) tablet 0.5 mg: .5 mg | ORAL | @ 01:00:00

## 2022-07-09 NOTE — Unmapped (Signed)
Warfarin Therapeutic Monitoring Pharmacy Note    Warren [name] is a 47 y.o. male  re-starting  warfarin.     Indication: mechanical aortic valve replacement (On-X valve type)    Prior Dosing Information: Current regimen 5 mg daily       Source(s) of information used to determine prior to admission dosing: Fill HIstory    Goals:  Therapeutic Drug Levels  INR range: 1.5-2 per cardiology    Additional Clinical Monitoring/Outcomes  Monitor hemoglobin and platelets  Monitor for signs and symptoms of bleeding  Monitor liver function (LFTs, bilirubin)    Results:  Lab Results   Component Value Date    INR 2.04 07/09/2022    INR 1.69 07/08/2022    INR 1.62 07/07/2022       Pharmacokinetic Considerations and Significant Drug Interactions:   Drug Interactions  see below table    Bridge Therapy  Enoxaparin (no longer on)    Concurrent Antiplatelet Medications  ASA    Assessment/Plan:  Recommendation(s)  INR is therapeutic   Continue current regimen of warfarin 5 mg daily    Follow-up  INR to be obtained: daily with AM labs    A pharmacist will continue to monitor and recommend INRs/dose changes as appropriate    Longitudinal Dose Monitoring:  Date AM INR PM Dose (mg) Key Drug Interactions   6/17 2.04 5 mg N/A   6/16 1.69 5 mg N/A   6/15 1.62 5 mg N/A   6/14 1.71 5 mg N/A   6/13 1.79 5 mg N/A   6/12 1.54 5 mg N/A   6/11 1.59 5 mg Doxycycline (ends 6/11 AM)   6/10 1.56 5 mg Doxycycline   6/9 1.43 5 mg Doxycycline   6/8 1.08 5 mg Doxycycline   6/7 1.01 5 mg Doxycycline   6/6 0.97 5 mg Doxycycline   6/5 -- 5 mg Doxycycline, IM Penicillin G   6/4 0.96 --- ---     A pharmacist will continue to monitor the INR daily and adjust the warfarin dose in conjunction with the medical team as appropriate. Please page service pharmacist with questions/clarifications.    Cherlynn Polo, PharmD, BCPS

## 2022-07-09 NOTE — Unmapped (Addendum)
Adult Nutrition Assessment Note    Visit Type: RN Consult  Reason for Visit: Assessment (Nutrition)    HPI & PMH:  Warren [name]. is a 47 y.o. 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 Now with right hemiplegia and expressive aphasia. He is now admitted to [facility] for comprehensive interdisciplinary rehabilitation.     Anthropometric Data:  Height: 182.9 cm (6')   Admission weight: 88.4 kg (194 lb 14.2 oz)  Last recorded weight: 88.1 kg (194 lb 3.6 oz)  IBW: 80.79 kg  Percent IBW: 109.42 %  BMI: Body mass index is 26.34 kg/m??.   Usual Body Weight:  190's     Weight history prior to admission:  No recent weight loss noted.    Wt Readings from Last 10 Encounters:   07/08/22 88.1 kg (194 lb 3.6 oz)   06/27/22 86.2 kg (190 lb)   06/26/22 83.1 kg (183 lb 4.8 oz)   03/07/22 90.4 kg (199 lb 3.2 oz)   03/07/22 90.3 kg (199 lb)   11/10/21 89.4 kg (197 lb)   11/09/21 90.5 kg (199 lb 9.6 oz)   11/03/21 87.5 kg (193 lb)   10/13/21 87.5 kg (193 lb)   10/13/21 (P) 87.7 kg (193 lb 6.4 oz)        Weight changes this admission:   Last 5 Recorded Weights    07/05/22 1600 07/08/22 0508   Weight: 88.4 kg (194 lb 14.2 oz) 88.1 kg (194 lb 3.6 oz)        Nutrition Focused Physical Exam:  Nutrition Focused Physical Exam:     Nutrition Evaluation  Overall Impressions: Nutrition-Focused Physical Exam not indicated due to lack of malnutrition risk factors. (07/09/22 1610)  Nutrition Designation: Overweight (BMI 25.00 - 29.99 kg/m2) (07/09/22 9604)      NUTRITIONALLY RELEVANT DATA     Medications: Nutritionally pertinent medications reviewed and evaluated for potential food and/or medication interactions.   Biktarvy, miralax, warfarin    Labs:   Nutritionally pertinent labs reviewed.     Results in Past 7 Days  Result Component Current Result   Sodium 140 (07/09/2022)   Potassium 4.7 (07/09/2022)   Chloride 109 (H) (07/09/2022)   CO2 27.1 (07/09/2022)   BUN 8 (L) (07/09/2022)   Creatinine 1.07 (07/09/2022)   Glucose 89 (07/09/2022)   Calcium 9.3 (07/09/2022)   Magnesium 2.0 (07/09/2022)   Phosphorus 3.2 (07/09/2022)       Nutrition History:   July 09, 2022: Prior to admission:  Patient denies any recent weight loss or decreased appetite. Patient has had an increase in appetite and RN reached out to RD to provide nutritional supplements due to patient's desire for additional calories. Patient denies recent GI symptoms.     Allergies, Intolerances, Sensitivities, and/or Cultural/Religious Dietary Restrictions: none identified at this time     Current Nutrition:  Oral intake     Nutrition Orders            Nutrition Therapy Regular/House starting at 06/12 1647            Nutritional Needs:   Healthy balance of carbohydrate, protein, and fat.  Malnutrition Assessment using AND/ASPEN or GLIM Clinical Characteristics:    Patient does not meet AND/ASPEN criteria for malnutrition at this time (07/09/22 1610)     GOALS and EVALUATION     Patient to meet 75% or greater of nutritional needs via combination of meals, snacks, and/or oral supplements within hospital admission.  - New    Motivation, Barriers, and Compliance:  Evaluation of motivation, barriers, and compliance completed. No concerns identified at this time.     NUTRITION ASSESSMENT     Current nutrition therapy is appropriate and likely meeting meeting nutritional needs at this time.       Discharge Planning:   Monitor for potential discharge needs with multi-disciplinary team.     Was the nutrition care plan completed? No, patient does not meet malnutrition criteria        NUTRITION INTERVENTIONS and RECOMMENDATION     Continue current diet: House  Recommend Adding Ensure Plus High Protein x2 at dinner   Weigh patient weekly    Follow-Up Parameters:   Signing off at this time (Please reconsult if needed)    Cordelle Dahmen C. Azucena Kuba, MPH, RD, LDN  Pager: [phone]
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
## 2022-07-09 NOTE — Unmapped (Signed)
Pt is A&Ox4, sat-ing well at room air, VSS. Pt was OOB with SaraSteady to the commode. Continues to deny pain. Will continue to monitor.    Problem: Rehabilitation (IRF) Plan of Care  Goal: Plan of Care Review  Outcome: Progressing  Goal: Patient-Specific Goal (Individualized)  Outcome: Progressing  Goal: Absence of New-Onset Illness or Injury  Outcome: Progressing  Intervention: Prevent Fall and Fall Injury  Recent Flowsheet Documentation  Taken 07/08/2022 1800 by Olena Leatherwood, Delana Meyer, RN  Safety Interventions: bed alarm  Taken 07/08/2022 1600 by Olena Leatherwood, Delana Meyer, RN  Safety Interventions: bed alarm  Taken 07/08/2022 1400 by Olena Leatherwood, Delana Meyer, RN  Safety Interventions: bed alarm  Taken 07/08/2022 1200 by Olena Leatherwood, Delana Meyer, RN  Safety Interventions: bed alarm  Taken 07/08/2022 1000 by Olena Leatherwood, Delana Meyer, RN  Safety Interventions: bed alarm  Taken 07/08/2022 0800 by Olena Leatherwood, Delana Meyer, RN  Safety Interventions: bed alarm  Intervention: Prevent Skin Injury  Recent Flowsheet Documentation  Taken 07/08/2022 0800 by Olena Leatherwood, Delana Meyer, RN  Device Skin Pressure Protection: absorbent pad utilized/changed  Skin Protection: adhesive use limited  Intervention: Prevent Infection  Recent Flowsheet Documentation  Taken 07/08/2022 0800 by Olena Leatherwood, Delana Meyer, RN  Infection Prevention: hand hygiene promoted  Intervention: Prevent VTE (Venous Thromboembolism)  Recent Flowsheet Documentation  Taken 07/08/2022 0800 by Olena Leatherwood, Delana Meyer, RN  VTE Prevention/Management: anticoagulant therapy  Anti-Embolism Intervention: Refused  Goal: Optimal Comfort and Wellbeing  Outcome: Progressing  Goal: Home and Community Transition Plan Established  Outcome: Progressing  Goal: Rounds/Family Conference  Outcome: Progressing     Problem: Rehabilitation (IRF) Plan of Care  Goal: Absence of New-Onset Illness or Injury  Intervention: Prevent Fall and Fall Injury  Recent Flowsheet Documentation  Taken 07/08/2022 1800 by Olena Leatherwood, Delana Meyer, RN  Safety Interventions: bed alarm  Taken 07/08/2022 1600 by Olena Leatherwood, Delana Meyer, RN  Safety Interventions: bed alarm  Taken 07/08/2022 1400 by Olena Leatherwood, Delana Meyer, RN  Safety Interventions: bed alarm  Taken 07/08/2022 1200 by Olena Leatherwood, Delana Meyer, RN  Safety Interventions: bed alarm  Taken 07/08/2022 1000 by Olena Leatherwood, Delana Meyer, RN  Safety Interventions: bed alarm  Taken 07/08/2022 0800 by Olena Leatherwood, Delana Meyer, RN  Safety Interventions: bed alarm

## 2022-07-09 NOTE — Unmapped (Signed)
[facility]  Follow-Up Psychiatry Consult Note      Date of admission: 07/04/2022  3:51 PM  Service Date: July 09, 2022  Primary Team: Physical Medicine and Rehabilitation [facility])  LOS:  LOS: 5 days      Assessment:   Warren [name]. is a 47 y.o. 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  Patient was seen in consultation by request of Drusilla Kanner, MD for evaluation of psychosis.     Warren [name]. presents with symptoms consistent with a diagnosis of unspecified psychosis with paranoid delusions that led to noncompliance with medications at home. Patient's report of feeling spirits throughout life, lack of family contact, and chronic emotional lability can be consistent with history of trauma, personality factors, or psychological distress. Complicating the patient's presentation includes recent stroke, advanced HIV, and chronic substance use. It is less likely that the patient's presentation is from a primary psychotic disorder given timeline of delusions (psychosis was not present over 1 year ago). The differential in the setting of advanced HIV and polysubstance use is broad and includes HIV-related neurocognitive disorder and substance-induced psychosis. See initial note for timeline based on collateral.     There was concern for post-stroke delirium initially due to poor attention/orientation, and as advanced HIV and chronic substance use increase likelihood of poor cognitive reserve and create vulnerability for delirium; however, orientation and attention have improved. Today, not spontaneously forthcoming about concerns about monitoring/surveillance, and court case. Thought process overall linear and logical. Disoriented to specific date - said it's June 24 and court case expected June 25 (1 month earlier than when last discussed). Possible that his mental status is improved 2/2 having resumed biktarvy, though low dose risperidone may also have helped. He denies side effects of risperidone, and is amenable to increasing dose to 1 mg nightly. Will plan to continue this dose with discharge. Discussed arranging follow up with psychiatry at the HIV clinic. Will follow up later this week.    Diagnoses:   Active Hospital problems:  Active Problems:    * No active hospital problems. *       Problems edited/added by me:  No problems updated.      Risk Assessment:  ASQ screening result: low risk    -A suicide and violence risk assessment was performed as part of this evaluation. Risk factors for self-harm/suicide: lack of social support, sense of isolation, current substance abuse, history of substance abuse, poor adherence to treatment , recent trauma, and chronic mood lability .  Protective factors against self-harm/suicide:  lack of active SI, no history of previous suicide attempts , supportive friends, enjoyment of leisure actvities, expresses purpose for living, religious or spiritual prohibition to suicide/violence, and safe housing.  Risk factors for harm to others: current substance abuse, active symptoms of psychosis, limited work history, and past substance abuse. Protective factors against harm to others: no known history of violence towards others, no known history of threats of harm towards others, no active symptoms of mania, no previous acts of violence in current setting, intolerant attititude toward deviance, positive social orientation, and religiosity.  Current suicide risk: low risk  Current homicide risk: low risk    Recommendations:     Safety and Observation Level:   -- This patient does NOT meet involuntary commitment (IVC) criteria. IVC paperwork was never filed. Please contact our team if there is a concern that risk level has changed.  -- Recommend routine observation per unit policy.  -- If the patient attempts to leave against medical advice and it is felt to be unsafe for them to leave, please call a Behavioral Response and page Psychiatry at [phone].    Medications:  -- increase Risperdal po to 1 mg HS    Further Work-up:   -- While the patient is receiving medications (such as Risperdal) that may prolong QTc and increase risk for torsades:    - MONITOR and KEEP Mg>2 and K>4      - MONITOR QTc regularly.  If QTc on tele strip >450ms, obtain 12-lead EKG.  -- lipid panel and A1c obtained for baseline with antipsychotic treatment  Metabolic Monitoring:  Initial Weight: Weight: 88.1 kg (194 lb 3.6 oz)  Last Weight: Weight: 88.1 kg (194 lb 3.6 oz)  Last BMI: Body mass index is 26.34 kg/m??.  Admit BP: BP: 112/82  Last BP: BP: 102/64  Lipid Panel:   Lab Results   Component Value Date    Cholesterol 165 06/27/2022    LDL Calculated 83 06/27/2022    HDL 36 (L) 06/27/2022    Triglycerides 230 (H) 06/27/2022     Hemoglobin A1C:   Lab Results   Component Value Date    Hemoglobin A1C 4.6 (L) 06/27/2022      Fasting Blood Sugar: No results found for: GLUF    Behavioral / Environmental:   -- Please continue Delirium (prevention) protocol detailed in initial consult note.    Follow-up:  -- When patient is discharged, please ensure that their AVS includes information about the 12 Suicide & Crisis Lifeline.  -- Patient may need extra support once discharged due to cognitive issues, but will defer to case management. Psychotic symptoms should be managed by optimizing medical treatment and abstaining from substances. He would benefit from establishing with [facility] and having neuropsych follow-up.  -- We will follow as needed at this time.     Thank you for this consult request. Recommendations have been communicated to the primary team. Please page [phone]  for any questions or concerns.     Discussed with and seen by Attending, Minus Breeding, MD, who agrees with the assessment and plan.    Trevor Iha, MD      Subjective     Relevant events since last seen by psychiatry:  Patient is cleared for SNF or [facility]  MRI discontinued due to pacemaker risk    Neuropsychology consulted from [facility]    Patient Interview:     Pretty good. Rehab is going excellent. Nothing is bothering him and feeling excellent. One day of difficulty with sleep, otherwise fairly good. Physically feeling stronger. Answered yes that he feels stiff in his body, but better than what it was. Much better compared to first presenting to the hospital. Denied any stomach upset or chest discomfort. In touch with his neighbors, both named Trey Paula. One of them is helping take care of his house. Denied having any thoughts that life is not worth living. Believes he has a court case tomorrow, the 25th. Believes it is Monday the 24th of June 2024. Re-oriented to date. DOW backwards: completed without errors.  Discussed Risperdal: did not have any questions about it. Has not noticed a difference and is open to continuing it. Discussed our recommendation to increase the dose further. He was in agreement with that plan. Also agreeable to looking into options to see a psychiatrist outpatient. Feels well cared for.     ROS:   All systems reviewed as negative/unremarkable aside from the following pertinent positives and negatives: None    Collateral:   - Reviewed medical records in Epic    Relevant Updates to past psychiatric, medical/surgical, family, or social history: None    Current Medications:  Scheduled Meds:   aspirin  81 mg Oral Daily    atorvastatin  80 mg Oral Daily    bictegrav-emtricit-tenofov ala  1 tablet Oral Daily    metoPROLOL succinate  50 mg Oral Daily nicotine  1 patch Transdermal Daily    polyethylene glycol  17 g Oral Daily    risperiDONE  0.5 mg Oral Nightly    warfarin  5 mg Oral Daily       Continuous Infusions:  PRN Meds:.acetaminophen, labetalol, magnesium hydroxide, melatonin     Objective:   Vital signs:   Temp:  [36.7 ??C (98 ??F)-36.7 ??C (98.1 ??F)] 36.7 ??C (98.1 ??F)  Heart Rate:  [88-92] 89  Resp:  [16-18] 18  BP: (102-113)/(64-86) 102/64  MAP (mmHg):  [75-92] 75  SpO2:  [96 %-99 %] 96 %    Physical Exam:  Gen: No acute distress.  Pulm: Normal work of breathing.  Neuro/MSK: Bulk thin.  Skin: normal skin tone.    Mental Status Exam:  Appearance:  appears stated age and in bed   Attitude:   calm, cooperative   Behavior/Psychomotor:  appropriate eye contact   Speech/Language:   normal rate, not pressured, normal volume, improved fluency. normal articulation   Mood:  ???okay???   Affect:   Euthymic   Thought process:  linear   Thought content:     Denies self-harm/SI thoughts, denies feeling like he needs to defend himself from others.    Perceptual disturbances:   behavior not concerning for response to internal stimuli   Attention:  able to attend to interview without fluctuations in consciousness   Concentration:  Able to fully concentrate and attend   Orientation:  Oriented to person, place, city, month, year, and situation. and Disoriented to  date.   Memory:  not formally tested, but grossly intact   Fund of knowledge:   not formally assessed   Insight:    Limited   Judgment:   Fair   Impulse Control:  Intact     Relevant laboratory/imaging data was reviewed.    Additional Psychometric Testing:  Not applicable.    Consult Type and Time-Based Documentation:    I was in the hospital.  I spent 8 minutes on the real-time audio and video with the patient for a a consultation. Total time was 10 minutes and over 50% of the service was counseling and/or coordination of care within the patient unit.      The patient and/or parent/guardian has been advised of the potential risks and limitations of this mode of treatment (including, but not limited to, the absence of in-person examination) and has agreed to be treated using telemedicine. The  Patient's, patient's guardian's and/or patient's family's questions regarding telemedicine have been answered.

## 2022-07-10 LAB — PROTIME-INR
INR: 1.99
PROTIME: 21.6 s — ABNORMAL HIGH (ref 9.9–12.6)

## 2022-07-10 MED ADMIN — atorvastatin (LIPITOR) tablet 80 mg: 80 mg | ORAL | @ 12:00:00

## 2022-07-10 MED ADMIN — nicotine (NICODERM CQ) 14 mg/24 hr patch 1 patch: 1 | TRANSDERMAL | @ 12:00:00

## 2022-07-10 MED ADMIN — bictegrav-emtricit-tenofov ala (BIKTARVY) 50-200-25 mg tablet 1 tablet: 1 | ORAL | @ 12:00:00

## 2022-07-10 MED ADMIN — risperiDONE (RisperDAL) tablet 1 mg: 1 mg | ORAL

## 2022-07-10 MED ADMIN — polyethylene glycol (MIRALAX) packet 17 g: 17 g | ORAL | @ 12:00:00

## 2022-07-10 MED ADMIN — metoPROLOL succinate (Toprol-XL) 24 hr tablet 50 mg: 50 mg | ORAL | @ 12:00:00

## 2022-07-10 MED ADMIN — warfarin (JANTOVEN) tablet 5 mg: 5 mg | ORAL | @ 21:00:00

## 2022-07-10 MED ADMIN — aspirin chewable tablet 81 mg: 81 mg | ORAL | @ 12:00:00

## 2022-07-10 NOTE — Unmapped (Signed)
**Note De-Identified Brick Ketcher Obfuscation** No changes this shift. All needs attended. Will continue to monitor.

## 2022-07-10 NOTE — Unmapped (Signed)
[name] remains pleasant overnight. VS within normal limits. No c/o of shortness of breath. Denies pain in the upper/lower extremity. Bed in low position, call bell in reach, side rails up x 4 and bed alarm with call light in reach. Neuro checks done and no changes. Warren [name] was able to call in his own snack.  Bed in low position. Warren [name] remains alert and able to follow commands. Voided in urinal multiple times and consumed appropriate food.  No injuries or fall reported to this time. No pressure injury noted or reported. Medications given per order. Will continue to monitor     Problem: Rehabilitation (IRF) Plan of Care  Goal: Plan of Care Review  Outcome: Progressing  Flowsheets (Taken 07/10/2022 0314)  Progress: improving  Plan of Care Reviewed With: patient  Goal: Patient-Specific Goal (Individualized)  Outcome: Progressing  Goal: Absence of New-Onset Illness or Injury  Outcome: Progressing  Intervention: Prevent Fall and Fall Injury  Recent Flowsheet Documentation  Taken 07/10/2022 0208 by Herminio Heads, RN  Safety Interventions:   bed alarm   low bed   fall reduction program maintained   nonskid shoes/slippers when out of bed  Taken 07/10/2022 0010 by Herminio Heads, RN  Safety Interventions:   low bed   fall reduction program maintained   nonskid shoes/slippers when out of bed   bed alarm  Taken 07/09/2022 2210 by Herminio Heads, RN  Safety Interventions:   bed alarm   low bed   fall reduction program maintained  Taken 07/09/2022 2000 by Herminio Heads, RN  Safety Interventions:   bed alarm   bleeding precautions   low bed   fall reduction program maintained   nonskid shoes/slippers when out of bed  Intervention: Prevent Skin Injury  Recent Flowsheet Documentation  Taken 07/09/2022 2000 by Herminio Heads, RN  Device Skin Pressure Protection: absorbent pad utilized/changed  Intervention: Prevent Infection  Recent Flowsheet Documentation  Taken 07/10/2022 0208 by Herminio Heads, RN  Infection Prevention: rest/sleep promoted  Taken 07/10/2022 0010 by Herminio Heads, RN  Infection Prevention: rest/sleep promoted  Taken 07/09/2022 2210 by Herminio Heads, RN  Infection Prevention: hand hygiene promoted  Taken 07/09/2022 2000 by Herminio Heads, RN  Infection Prevention: hand hygiene promoted  Goal: Optimal Comfort and Wellbeing  Outcome: Progressing  Goal: Home and Community Transition Plan Established  Outcome: Progressing  Goal: Rounds/Family Conference  Outcome: Progressing     Problem: Self-Care Deficit  Goal: Improved Ability to Complete Activities of Daily Living  Outcome: Progressing     Problem: Fall Injury Risk  Goal: Absence of Fall and Fall-Related Injury  Outcome: Progressing  Intervention: Promote Injury-Free Environment  Recent Flowsheet Documentation  Taken 07/10/2022 0208 by Herminio Heads, RN  Safety Interventions:   bed alarm   low bed   fall reduction program maintained   nonskid shoes/slippers when out of bed  Taken 07/10/2022 0010 by Herminio Heads, RN  Safety Interventions:   low bed   fall reduction program maintained   nonskid shoes/slippers when out of bed   bed alarm  Taken 07/09/2022 2210 by Herminio Heads, RN  Safety Interventions:   bed alarm   low bed   fall reduction program maintained  Taken 07/09/2022 2000 by Herminio Heads, RN  Safety Interventions:   bed alarm   bleeding precautions   low bed   fall reduction program maintained   nonskid shoes/slippers when out of bed     Problem: Skin Injury Risk Increased  Goal: Skin Health and Integrity  Outcome:  Progressing  Intervention: Optimize Skin Protection  Recent Flowsheet Documentation  Taken 07/10/2022 0208 by Herminio Heads, RN  Head of Bed [facility]) Positioning: HOB at 30 degrees  Taken 07/10/2022 0010 by Herminio Heads, RN  Pressure Reduction Techniques:   frequent weight shift encouraged   heels elevated off bed  Head of Bed [facility]) Positioning: HOB at 30 degrees  Taken 07/09/2022 2210 by Herminio Heads, RN  Pressure Reduction Techniques:   frequent weight shift encouraged   heels elevated off bed  Head of Bed [facility]) Positioning: HOB at 20-30 degrees  Taken 07/09/2022 2000 by Herminio Heads, RN  Pressure Reduction Techniques: frequent weight shift encouraged  Head of Bed (HOB) Positioning: HOB at 20-30 degrees     Problem: Mobility Impairment  Goal: Optimal Mobility  Outcome: Progressing  Intervention: Optimize Mobility  Recent Flowsheet Documentation  Taken 07/10/2022 0208 by Herminio Heads, RN  Positioning/Transfer Devices:   pillows   in use  Taken 07/10/2022 0010 by Herminio Heads, RN  Positioning/Transfer Devices:   pillows   in use  Taken 07/09/2022 2210 by Herminio Heads, RN  Positioning/Transfer Devices:   pillows   in use  Taken 07/09/2022 2000 by Herminio Heads, RN  Positioning/Transfer Devices:   pillows   in use     Problem: Stroke Rehabilitation  Goal: Optimal Adjustment to Stroke  Outcome: Progressing  Goal: Optimal Safe BADL Performance  Outcome: Progressing  Goal: Effective Bowel Elimination/Continence  Outcome: Progressing  Goal: Optimal Cognitive Function  Outcome: Progressing  Goal: Effective Communication Skills  Outcome: Progressing  Goal: Optimal Safe IADL Performance  Outcome: Progressing  Goal: Optimal Mobility Independence and Safety  Outcome: Progressing  Goal: Optimal Movement and Motor Control  Outcome: Progressing  Goal: Optimal Nutrition Intake  Outcome: Progressing  Goal: Optimal Sensory Perceptual Status  Outcome: Progressing  Goal: Maintains Intimacy/Sexual Expression  Outcome: Progressing  Goal: Effective Spasticity Management  Outcome: Progressing  Goal: Safe and Effective Swallow  Outcome: Progressing  Goal: Effective Urinary Elimination/Continence  Outcome: Progressing

## 2022-07-10 NOTE — Unmapped (Signed)
Goals progressing as per treatment/care plan.
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
## 2022-07-10 NOTE — Unmapped (Signed)
See notes below

## 2022-07-10 NOTE — Unmapped (Signed)
Confidential Rehabilitation Counseling Consult Note    Attending Rehabilitation Counselor:  Bryson Dames, PhD, CRC  Patient: Warren [name].  Date:  07/10/2022; 10am    Counseling Service:  Consult/Mood Check/Supportive Counseling    Patient Information and Reason for Referral: Zaeden Selders. is a 47 y.o. 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 Now with right hemiplegia and expressive aphasia. He is now admitted to [facility] for comprehensive interdisciplinary rehabilitation.      Rehab Impairment Group Code [facility]):  (Stroke) 01.2 Right Body Involvement (Left Brain)   Etiology: Likely cardioembolic in the setting of decreased warfarin adherence    Consultation with rehabilitation counseling was requested to assess emotional adjustment and coping, barriers to rehabilitation and provide supportive counseling.     Therapies are going well, Mr. Magro is making progress, but not as quickly as he would like, and that is frustrating. He knows he needs more patience and asked RC to help with strategies to increase his mental stamina, patience and perseverance in rehab. We talked about cognitive strategies that he could try.     Mr. Lycett is managing stress fine, he uses journaling and his faith to get through hardship, process life experiences, and gather the strength and motivation he needs to continue. Life is challenging, he has had a series of disappointments (in relationships, he is at the point when he is unable to trust anyone and is not willing to develop any new connections). Mr. Relaford is estranged from his family so his only support is Trey Paula, a good friend he met a few years ago. Trey Paula calls him daily, at least once, and is tending to his home while he is hospitalized. He is a solid support, a father-figure (2 years old) who he can rely on in difficult times like this.    Mr. Viloria denied any addiction problems in the past, other than smoking. He is using the nicotine patch and is pleased with the results; he is hopeful he will not feel the need to return to smoking.     Mr. Mcfarren and [facility] talked about return-to-work options, and vocational rehab services (if needed). 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 He collected unemployment for the following years (offered with his severance package) but is willing to consider returning to work if his health gets better to enable that. RC explained vocational rehab services and eligibility criteria. Mr. Albrecht asked for a brochure.     RC provided supportive counseling, and encouraged Mr. Brookman to continue his journey, sustain motivation, and build his mental stamina to be able to manage the rehab process and his frustrations more successfully.     50 min face-to-face with a patient. RC will follow. No charge.

## 2022-07-11 LAB — PROTIME-INR
INR: 1.88
PROTIME: 20.5 s — ABNORMAL HIGH (ref 9.9–12.6)

## 2022-07-11 MED ADMIN — bictegrav-emtricit-tenofov ala (BIKTARVY) 50-200-25 mg tablet 1 tablet: 1 | ORAL | @ 13:00:00

## 2022-07-11 MED ADMIN — warfarin (JANTOVEN) tablet 5 mg: 5 mg | ORAL | @ 21:00:00

## 2022-07-11 MED ADMIN — metoPROLOL succinate (Toprol-XL) 24 hr tablet 50 mg: 50 mg | ORAL | @ 13:00:00

## 2022-07-11 MED ADMIN — atorvastatin (LIPITOR) tablet 80 mg: 80 mg | ORAL | @ 13:00:00

## 2022-07-11 MED ADMIN — risperiDONE (RisperDAL) tablet 1 mg: 1 mg | ORAL | @ 01:00:00

## 2022-07-11 MED ADMIN — nicotine (NICODERM CQ) 14 mg/24 hr patch 1 patch: 1 | TRANSDERMAL | @ 13:00:00

## 2022-07-11 MED ADMIN — aspirin chewable tablet 81 mg: 81 mg | ORAL | @ 13:00:00

## 2022-07-11 MED ADMIN — polyethylene glycol (MIRALAX) packet 17 g: 17 g | ORAL | @ 13:00:00

## 2022-07-11 NOTE — Unmapped (Signed)
Alert and oriented.Resting in bed watching TV.Continent of bowel and bladder.No complaints this shift.Callbell within reach.  Problem: Rehabilitation (IRF) Plan of Care  Goal: Plan of Care Review  Outcome: Progressing  Goal: Patient-Specific Goal (Individualized)  Outcome: Progressing  Goal: Absence of New-Onset Illness or Injury  Outcome: Progressing  Intervention: Prevent Fall and Fall Injury  Recent Flowsheet Documentation  Taken 07/11/2022 0600 by Barbaraann Cao, RN  Safety Interventions: fall reduction program maintained  Taken 07/11/2022 0400 by Barbaraann Cao, RN  Safety Interventions: fall reduction program maintained  Taken 07/11/2022 0200 by Barbaraann Cao, RN  Safety Interventions: fall reduction program maintained  Taken 07/11/2022 0000 by Barbaraann Cao, RN  Safety Interventions: fall reduction program maintained  Taken 07/10/2022 2200 by Barbaraann Cao, RN  Safety Interventions: fall reduction program maintained  Taken 07/10/2022 2000 by Barbaraann Cao, RN  Safety Interventions: fall reduction program maintained  Goal: Optimal Comfort and Wellbeing  Outcome: Progressing  Goal: Home and Community Transition Plan Established  Outcome: Progressing  Goal: Rounds/Family Conference  Outcome: Progressing
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
## 2022-07-11 NOTE — Unmapped (Signed)
Goals progressing as per treatment/care plan.

## 2022-07-12 LAB — BASIC METABOLIC PANEL
ANION GAP: 6 mmol/L (ref 5–14)
BLOOD UREA NITROGEN: 13 mg/dL (ref 9–23)
BUN / CREAT RATIO: 13
CALCIUM: 9.4 mg/dL (ref 8.7–10.4)
CHLORIDE: 107 mmol/L (ref 98–107)
CO2: 26.9 mmol/L (ref 20.0–31.0)
CREATININE: 1.03 mg/dL
EGFR CKD-EPI (2021) MALE: >90 mL/min/{1.73_m2} (ref >=60)
GLUCOSE RANDOM: 83 mg/dL (ref 70–179)
POTASSIUM: 4.5 mmol/L (ref 3.5–5.1)
SODIUM: 140 mmol/L (ref 135–145)

## 2022-07-12 LAB — CBC
HEMATOCRIT: 43.8 % (ref 39.0–48.0)
HEMOGLOBIN: 14.4 g/dL (ref 12.9–16.5)
MEAN CORPUSCULAR HEMOGLOBIN CONC: 32.8 g/dL (ref 32.0–36.0)
MEAN CORPUSCULAR HEMOGLOBIN: 29.3 pg (ref 25.9–32.4)
MEAN CORPUSCULAR VOLUME: 89.3 fL (ref 77.6–95.7)
MEAN PLATELET VOLUME: 7.4 fL (ref 6.8–10.7)
PLATELET COUNT: 335 10*9/L (ref 150–450)
RED BLOOD CELL COUNT: 4.91 10*12/L (ref 4.26–5.60)
RED CELL DISTRIBUTION WIDTH: 12.7 % (ref 12.2–15.2)
WBC ADJUSTED: 6 10*9/L (ref 3.6–11.2)

## 2022-07-12 LAB — MAGNESIUM: MAGNESIUM: 1.7 mg/dL (ref 1.6–2.6)

## 2022-07-12 LAB — PHOSPHORUS: PHOSPHORUS: 4 mg/dL (ref 2.4–5.1)

## 2022-07-12 MED ADMIN — risperiDONE (RisperDAL) tablet 1 mg: 1 mg | ORAL | @ 01:00:00

## 2022-07-12 MED ADMIN — aspirin chewable tablet 81 mg: 81 mg | ORAL | @ 15:00:00

## 2022-07-12 MED ADMIN — warfarin (JANTOVEN) tablet 5 mg: 5 mg | ORAL | @ 22:00:00

## 2022-07-12 MED ADMIN — nicotine (NICODERM CQ) 14 mg/24 hr patch 1 patch: 1 | TRANSDERMAL | @ 15:00:00

## 2022-07-12 MED ADMIN — magnesium oxide (MAG-OX) tablet 400 mg: 400 mg | ORAL | @ 18:00:00 | Stop: 2022-07-12

## 2022-07-12 MED ADMIN — bictegrav-emtricit-tenofov ala (BIKTARVY) 50-200-25 mg tablet 1 tablet: 1 | ORAL | @ 15:00:00

## 2022-07-12 MED ADMIN — metoPROLOL succinate (Toprol-XL) 24 hr tablet 50 mg: 50 mg | ORAL | @ 15:00:00

## 2022-07-12 MED ADMIN — atorvastatin (LIPITOR) tablet 80 mg: 80 mg | ORAL | @ 15:00:00

## 2022-07-12 NOTE — Unmapped (Signed)
Cardiology treatment plan note    Cardiology paged regarding possible QTc prolongation on this patient.  He has complicated cardiac history including prosthetic aortic valve endocarditis complicated by dehiscence, endocarditis complicated by severe AI and root abscess, bioprosthetic AVR, mitral valve repair, and placement of epicardial pacemaker 2020, he is pacer dependent.    EKG 07/10/2022 shows AV paced rhythm, with QRS 178, calculated QTc around 520.  The T wave is less than half of the R-R interval, and calculated QTc is artificially prolonged by the wide QRS. Roughly, corrected QTc is about 470.    Would recommend monitoring about once a week with EKG, however there are no contraindications to uptitrating his psychiatric medications at this time.      Please reengage cardiology if evaluation of another EKG is indicated or formal consultation is required.    Lovenia Shuck, MD

## 2022-07-12 NOTE — Unmapped (Signed)
[facility]  Follow-Up Psychiatry Consult Note      Date of admission: 07/04/2022  3:51 PM  Service Date: July 12, 2022  Primary Team: Physical Medicine and Rehabilitation [facility])  LOS:  LOS: 8 days      Assessment:   Warren Ladnier. is a 47 y.o. 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  Patient was seen in consultation by request of Drusilla Kanner, MD for evaluation of psychosis.     Warren Dove. presents with symptoms consistent with a diagnosis of unspecified psychosis with paranoid delusions that led to noncompliance with medications at home. Patient's report of feeling spirits throughout life, lack of family contact, and chronic emotional lability can be consistent with history of trauma, personality factors, or psychological distress. Complicating the patient's presentation includes recent stroke, advanced HIV, and chronic substance use. It is less likely that the patient's presentation is from a primary psychotic disorder given timeline of delusions (psychosis was not present over 1 year ago). The differential in the setting of advanced HIV and polysubstance use is broad and includes HIV-related neurocognitive disorder and substance-induced psychosis. See initial note for timeline based on collateral.     Today, discussed concern about Qtc with patient. Recommend engaging cardiology for interpretation of Qtc given question of whether it is prolonged with AV paced rhythm. Warren [name] reports feeling that it is easier to 'let go and let God' since starting the medication (with regard to many of the concerns that he has described - being monitored, family's reaction to his inheritance, court case). Denies feeling that risperidone has lowered his motivation to do things he has control over (such as engaging in therapies at [facility]). He would like to continue taking medication, but states that he would not want to assume risk of prolonged QT. Discussed that if cardiology thinks the prolongation is accurate, we would recommend substituting abilify for risperidone. Discussed that abilify is a very similar medication, less likely to prolong QT, though more likely to cause akathisia. He was agreeable with continuing Risperidone pending cardiology's assessment, and switching if there is still concern for QT prolongation vs continuing risperidone if not. Recommend continuing risperidone at the current dose this evening.     Diagnoses:   Active Hospital problems:  Active Problems:    * No active hospital problems. *       Problems edited/added by me:  No problems updated.      Risk Assessment:  ASQ screening result: low risk    -A suicide and violence risk assessment was performed as part of this evaluation. Risk factors for self-harm/suicide: lack of social support, sense of isolation, current substance abuse, history of substance abuse, poor adherence to treatment , recent trauma, and chronic mood lability .  Protective factors against self-harm/suicide:  lack of active SI, no history of previous suicide attempts , supportive friends, enjoyment of leisure actvities, expresses purpose for living, religious or spiritual prohibition to suicide/violence, and safe housing.  Risk factors for harm to others: current substance abuse, active symptoms of psychosis, limited work history, and past substance abuse. Protective factors against  harm to others: no known history of violence towards others, no known history of threats of harm towards others, no active symptoms of mania, no previous acts of violence in current setting, intolerant attititude toward deviance, positive social orientation, and religiosity.     Current suicide risk: low risk  Current homicide risk: low risk    Recommendations:     Safety and Observation Level:   -- This patient does NOT meet involuntary commitment (IVC) criteria. IVC paperwork was never filed. Please contact our team if there is a concern that risk level has changed.  -- Recommend routine observation per unit policy.  -- If the patient attempts to leave against medical advice and it is felt to be unsafe for them to leave, please call a Behavioral Response and page Psychiatry at [phone].    Medications:  -- continue Risperdal po to 1 mg HS    Further Work-up:   -- recommend engaging cardiology with regard to question of QT prolongation  -- While the patient is receiving medications (such as Risperdal) that may prolong QTc and increase risk for torsades:    - MONITOR and KEEP Mg>2 and K>4      - MONITOR QTc regularly.  If QTc on tele strip >415ms, obtain 12-lead EKG.  -- lipid panel and A1c obtained for baseline with antipsychotic treatment  Metabolic Monitoring:  Initial Weight: Weight: 90.7 kg (199 lb 15.3 oz)  Last Weight: Weight: 90.7 kg (199 lb 15.3 oz)  Last BMI: Body mass index is 27.12 kg/m??.  Admit BP: BP: 112/82  Last BP: BP: 123/75  Lipid Panel:   Lab Results   Component Value Date    Cholesterol 165 06/27/2022    LDL Calculated 83 06/27/2022    HDL 36 (L) 06/27/2022    Triglycerides 230 (H) 06/27/2022     Hemoglobin A1C:   Lab Results   Component Value Date    Hemoglobin A1C 4.6 (L) 06/27/2022      Fasting Blood Sugar: No results found for: GLUF    Behavioral / Environmental:   -- Please continue Delirium (prevention) protocol detailed in initial consult note.    Follow-up:  -- When patient is discharged, please ensure that their AVS includes information about the 28 Suicide & Crisis Lifeline.  -- Patient may need extra support once discharged due to cognitive issues, but will defer to case management. Psychotic symptoms should be managed by optimizing medical treatment and abstaining from substances. He would benefit from establishing with [facility] and having neuropsych follow-up.  -- Our team is coordinating with the [facility] clinic to arrange psychiatric follow up within their clinic.   -- We will follow as needed at this time.     Thank you for this consult request. Recommendations have been communicated to the primary team. Please page [phone]  for any questions or concerns.     Discussed with and seen by Attending, Minus Breeding, MD, who agrees with the assessment and plan.    Trevor Iha, MD      Subjective     Relevant events since last seen by psychiatry:  Patient is cleared for SNF or [facility]  MRI discontinued due to pacemaker risk    Neuropsychology consulted from [facility]    QTCf/QTcalc  6/6:  476 ms/508 ms  6/18: 487 ms/521 ms    Patient Interview:     Doing good.  Things are going pretty good with rehab.  Feels like he continues to make progress  with therapy - walking better.  Needs to continue to work on his hand.  Sleep is going good - thinks he is getting about 8 hours.  Feels rested when he wakes up.  Appetite is well.  Feeling safe.  No concerns with his care in the hospital.  Looking forward to being able to leave the hospital, but wants to be functional.  Concerned about his arm and wants to make sure he has aftercare to make sure it gets right.    Thinks the medicine is helpful with letting him let go, and let God.   Feels like the Risperdal is helping with therapy as well and feeling more lifted up.    Has figured out the court case - has already been decided.  It was about an inheritance, family member left him some money.    Does not believe he will live in the same place anymore.  Not sure where he would go, mentioned maybe another state.  Mentioned the holy ghost has told him that about moving to another state.  Spoke to him quietly and said he was giving me peace over it.  Thinks everything is supposed to be a surprise, it is out of my hand.  Would initially go back to his home, but likely wouldn't be long - said it is all fuzzy and don't have it figured out.  Emphasized it is not official.  Does not need to defend self from anybody.    Denied SI/HI  DOWB: completed without errors     We discussed the risks related to prolonged aspects of his heart rhythm and potential increased risk of arrhythmia.  We discussed options for next steps such as having cardiology weigh in on the EKG in setting of pacemaker and consideration for continuation of Risperdal vs change to Abilify which has less risk of this prolongation.  He would like to know A) if that is the case, and B) if that is the case, would want to go with the option that is lower complications.  Discussed things to look out for if we end up switching to Abilify such as motor restlessness, tremor or movement abnormalities or involuntary movements.     ROS:   All systems reviewed as negative/unremarkable aside from the following pertinent positives and negatives: None    Collateral:   - Reviewed medical records in Epic    Relevant Updates to past psychiatric, medical/surgical, family, or social history: None    Current Medications:  Scheduled Meds:   aspirin  81 mg Oral Daily    atorvastatin  80 mg Oral Daily    bictegrav-emtricit-tenofov ala  1 tablet Oral Daily    metoPROLOL succinate  50 mg Oral Daily    nicotine  1 patch Transdermal Daily    polyethylene glycol  17 g Oral Daily    risperiDONE  1 mg Oral Nightly    warfarin  5 mg Oral Daily       Continuous Infusions:  PRN Meds:.acetaminophen, labetalol, magnesium hydroxide, melatonin     Objective:   Vital signs:   Temp:  [36.5 ??C (97.7 ??F)-36.8 ??C (98.2 ??F)] 36.5 ??C (97.7 ??F)  Heart Rate:  [82-91] 90  Resp:  [18] 18  BP: (94-123)/(70-81) 123/75  MAP (mmHg):  [77-93] 86  SpO2:  [99 %-100 %] 99 %    Physical Exam:  Gen: No acute distress.  Pulm: Normal work of breathing.  Neuro/MSK: Bulk thin.  Skin: normal skin tone.  Mental Status Exam:  Appearance:  appears stated age and in bed   Attitude:   calm, cooperative   Behavior/Psychomotor:  appropriate eye contact   Speech/Language:   normal rate, not pressured, normal volume, improved fluency. normal articulation   Mood:  ???okay???   Affect:   Euthymic   Thought process:  linear   Thought content:     Denies self-harm/SI thoughts, denies feeling like he needs to defend himself from others.    Perceptual disturbances:   behavior not concerning for response to internal stimuli   Attention:  able to attend to interview without fluctuations in consciousness   Concentration:  Able to fully concentrate and attend   Orientation:  Oriented to person, place, city, month, year, and situation. and Disoriented to  date.   Memory:  not formally tested, but grossly intact   Fund of knowledge:   not formally assessed   Insight:    Limited   Judgment:   Fair   Impulse Control:  Intact     Relevant laboratory/imaging data was reviewed.    Additional Psychometric Testing:  Not applicable.    Consult Type and Time-Based Documentation:    I was in the hospital.  I spent 20 minutes on the real-time audio and video with the patient for a a consultation. Total time was 30 minutes and over 50% of the service was counseling and/or coordination of care within the patient unit.      The patient and/or parent/guardian has been advised of the potential risks and limitations of this mode of treatment (including, but not limited to, the absence of in-person examination) and has agreed to be treated using telemedicine. The  Patient's, patient's guardian's and/or patient's family's questions regarding telemedicine have been answered.

## 2022-07-12 NOTE — Unmapped (Signed)
Pt is A/Ox4. RA. Pt is able to express needs, no c/o pain or SOB. [place] Dec steady used for transfer. VTE: warfarin. Pt is voiding. Continent of bowel. No BM this shift. Pt worked with PT/OT. Pt resting in bed and reports needs met at this time. Call bell and bedside table within reach.     Problem: Rehabilitation (IRF) Plan of Care  Goal: Plan of Care Review  Outcome: Progressing  Flowsheets (Taken 07/12/2022 1132)  Progress: improving  Plan of Care Reviewed With: patient  Goal: Patient-Specific Goal (Individualized)  Outcome: Progressing  Flowsheets (Taken 07/12/2022 1132)  Patient/Family-Specific Goals (Include Timeframe): Pt will be free from falls this shift 7a  Individualized Care Needs: Monitor, PT/OT/Speech, fall/safety precautions  Anxieties, Fears or Concerns: none expressed  Goal: Absence of New-Onset Illness or Injury  Outcome: Progressing  Intervention: Prevent Fall and Fall Injury  Recent Flowsheet Documentation  Taken 07/12/2022 1000 by Jerelene Redden, RN  Safety Interventions:   environmental modification   fall reduction program maintained   lighting adjusted for tasks/safety   low bed   commode/urinal/bedpan at bedside  Taken 07/12/2022 0800 by Jerelene Redden, RN  Safety Interventions:   environmental modification   fall reduction program maintained   lighting adjusted for tasks/safety   low bed   commode/urinal/bedpan at bedside  Taken 07/12/2022 0730 by Jerelene Redden, RN  Safety Interventions:   environmental modification   fall reduction program maintained   lighting adjusted for tasks/safety   low bed  Intervention: Prevent Infection  Recent Flowsheet Documentation  Taken 07/12/2022 1000 by Jerelene Redden, RN  Infection Prevention:   environmental surveillance performed   hand hygiene promoted  Taken 07/12/2022 0800 by Jerelene Redden, RN  Infection Prevention:   environmental surveillance performed   hand hygiene promoted  Taken 07/12/2022 0730 by Jerelene Redden, RN  Infection Prevention:   environmental surveillance performed   hand hygiene promoted  Intervention: Prevent VTE (Venous Thromboembolism)  Recent Flowsheet Documentation  Taken 07/12/2022 1000 by Jerelene Redden, RN  Anti-Embolism Intervention: (warfarin) Other (Comment)  Taken 07/12/2022 0900 by Jerelene Redden, RN  VTE Prevention/Management: anticoagulant therapy  Anti-Embolism Intervention: (warfarin) Other (Comment)  Taken 07/12/2022 0800 by Jerelene Redden, RN  Anti-Embolism Intervention: (warfarin) Other (Comment)  Goal: Optimal Comfort and Wellbeing  Outcome: Progressing  Goal: Home and Community Transition Plan Established  Outcome: Progressing  Goal: Rounds/Family Conference  Outcome: Progressing     Problem: Self-Care Deficit  Goal: Improved Ability to Complete Activities of Daily Living  Outcome: Progressing     Problem: Fall Injury Risk  Goal: Absence of Fall and Fall-Related Injury  Outcome: Progressing  Intervention: Promote Injury-Free Environment  Recent Flowsheet Documentation  Taken 07/12/2022 1000 by Jerelene Redden, RN  Safety Interventions:   environmental modification   fall reduction program maintained   lighting adjusted for tasks/safety   low bed   commode/urinal/bedpan at bedside  Taken 07/12/2022 0800 by Jerelene Redden, RN  Safety Interventions:   environmental modification   fall reduction program maintained   lighting adjusted for tasks/safety   low bed   commode/urinal/bedpan at bedside  Taken 07/12/2022 0730 by Jerelene Redden, RN  Safety Interventions:   environmental modification   fall reduction program maintained   lighting adjusted for tasks/safety   low bed     Problem: Skin Injury Risk Increased  Goal: Skin Health and Integrity  Outcome: Progressing     Problem: Mobility Impairment  Goal:  Optimal Mobility  Outcome: Progressing     Problem: Stroke Rehabilitation  Goal: Optimal Adjustment to Stroke  Outcome: Progressing  Goal: Optimal Safe BADL Performance  Outcome: Progressing  Goal: Effective Bowel Elimination/Continence  Outcome: Progressing  Goal: Optimal Cognitive Function  Outcome: Progressing  Goal: Effective Communication Skills  Outcome: Progressing  Goal: Optimal Safe IADL Performance  Outcome: Progressing  Goal: Optimal Mobility Independence and Safety  Outcome: Progressing  Goal: Optimal Movement and Motor Control  Outcome: Progressing  Goal: Optimal Nutrition Intake  Outcome: Progressing  Goal: Optimal Sensory Perceptual Status  Outcome: Progressing  Goal: Maintains Intimacy/Sexual Expression  Outcome: Progressing  Goal: Effective Spasticity Management  Outcome: Progressing  Goal: Safe and Effective Swallow  Outcome: Progressing  Intervention: Optimize Eating and Swallowing  Recent Flowsheet Documentation  Taken 07/12/2022 1000 by Jerelene Redden, RN  Aspiration Precautions: awake/alert before oral intake  Taken 07/12/2022 0800 by Jerelene Redden, RN  Aspiration Precautions: awake/alert before oral intake  Taken 07/12/2022 0730 by Jerelene Redden, RN  Aspiration Precautions:   respiratory status monitored   upright posture maintained  Goal: Effective Urinary Elimination/Continence  Outcome: Progressing

## 2022-07-12 NOTE — Unmapped (Signed)
Patient is resting in bed watching TV.No acute changes this shift.Callbell within reach.  Problem: Rehabilitation (IRF) Plan of Care  Goal: Plan of Care Review  Outcome: Progressing  Goal: Patient-Specific Goal (Individualized)  Outcome: Progressing  Goal: Absence of New-Onset Illness or Injury  Outcome: Progressing  Intervention: Prevent Fall and Fall Injury  Recent Flowsheet Documentation  Taken 07/12/2022 0600 by Barbaraann Cao, RN  Safety Interventions: fall reduction program maintained  Taken 07/12/2022 0400 by Barbaraann Cao, RN  Safety Interventions: fall reduction program maintained  Taken 07/12/2022 0200 by Barbaraann Cao, RN  Safety Interventions: fall reduction program maintained  Taken 07/12/2022 0000 by Barbaraann Cao, RN  Safety Interventions: fall reduction program maintained  Taken 07/11/2022 2200 by Barbaraann Cao, RN  Safety Interventions: fall reduction program maintained  Taken 07/11/2022 2000 by Barbaraann Cao, RN  Safety Interventions: fall reduction program maintained  Goal: Optimal Comfort and Wellbeing  Outcome: Progressing  Goal: Home and Community Transition Plan Established  Outcome: Progressing  Goal: Rounds/Family Conference  Outcome: Progressing
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
## 2022-07-13 MED ADMIN — bictegrav-emtricit-tenofov ala (BIKTARVY) 50-200-25 mg tablet 1 tablet: 1 | ORAL | @ 13:00:00

## 2022-07-13 MED ADMIN — atorvastatin (LIPITOR) tablet 80 mg: 80 mg | ORAL | @ 13:00:00

## 2022-07-13 MED ADMIN — metoPROLOL succinate (Toprol-XL) 24 hr tablet 50 mg: 50 mg | ORAL | @ 13:00:00

## 2022-07-13 MED ADMIN — aspirin chewable tablet 81 mg: 81 mg | ORAL | @ 13:00:00

## 2022-07-13 MED ADMIN — nicotine (NICODERM CQ) 14 mg/24 hr patch 1 patch: 1 | TRANSDERMAL | @ 13:00:00

## 2022-07-13 MED ADMIN — risperiDONE (RisperDAL) tablet 1 mg: 1 mg | ORAL

## 2022-07-13 MED ADMIN — magnesium oxide (MAG-OX) tablet 400 mg: 400 mg | ORAL | Stop: 2022-07-12

## 2022-07-13 MED ADMIN — warfarin (JANTOVEN) tablet 5 mg: 5 mg | ORAL | @ 21:00:00
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
## 2022-07-13 NOTE — Unmapped (Signed)
Patient is resting in bed watching TV.Continent of bowel and bladder.No complaints this shift.Callbell within reach.  Problem: Rehabilitation (IRF) Plan of Care  Goal: Plan of Care Review  Outcome: Progressing  Goal: Patient-Specific Goal (Individualized)  Outcome: Progressing  Goal: Absence of New-Onset Illness or Injury  Outcome: Progressing  Intervention: Prevent Fall and Fall Injury  Recent Flowsheet Documentation  Taken 07/13/2022 0400 by Barbaraann Cao, RN  Safety Interventions: fall reduction program maintained  Taken 07/13/2022 0200 by Barbaraann Cao, RN  Safety Interventions: fall reduction program maintained  Taken 07/13/2022 0000 by Barbaraann Cao, RN  Safety Interventions: fall reduction program maintained  Taken 07/12/2022 2200 by Barbaraann Cao, RN  Safety Interventions: fall reduction program maintained  Taken 07/12/2022 2000 by Barbaraann Cao, RN  Safety Interventions: fall reduction program maintained  Goal: Optimal Comfort and Wellbeing  Outcome: Progressing  Goal: Home and Community Transition Plan Established  Outcome: Progressing  Goal: Rounds/Family Conference  Outcome: Progressing

## 2022-07-13 NOTE — Unmapped (Addendum)
Patient is alert and oriented. Room air. Continent on bowel and bladder. Can take pills whole with water. Aspiration precautions. Fall precautions.         Problem: Rehabilitation (IRF) Plan of Care  Goal: Plan of Care Review  Outcome: Progressing  Goal: Patient-Specific Goal (Individualized)  Outcome: Progressing  Goal: Absence of New-Onset Illness or Injury  Outcome: Progressing  Intervention: Prevent Fall and Fall Injury  Recent Flowsheet Documentation  Taken 07/13/2022 1000 by Enriqueta Shutter, RN  Safety Interventions: fall reduction program maintained  Taken 07/13/2022 0800 by Enriqueta Shutter, RN  Safety Interventions: fall reduction program maintained  Intervention: Prevent Skin Injury  Recent Flowsheet Documentation  Taken 07/13/2022 1015 by Enriqueta Shutter, RN  Device Skin Pressure Protection: absorbent pad utilized/changed  Skin Protection: incontinence pads utilized  Taken 07/13/2022 1000 by Enriqueta Shutter, RN  Device Skin Pressure Protection: absorbent pad utilized/changed  Skin Protection: incontinence pads utilized  Taken 07/13/2022 0800 by Enriqueta Shutter, RN  Device Skin Pressure Protection: absorbent pad utilized/changed  Skin Protection: incontinence pads utilized  Intervention: Prevent Infection  Recent Flowsheet Documentation  Taken 07/13/2022 1000 by Enriqueta Shutter, RN  Infection Prevention: environmental surveillance performed  Taken 07/13/2022 0800 by Enriqueta Shutter, RN  Infection Prevention: environmental surveillance performed  Intervention: Prevent VTE (Venous Thromboembolism)  Recent Flowsheet Documentation  Taken 07/13/2022 1015 by Enriqueta Shutter, RN  VTE Prevention/Management: anticoagulant therapy  Goal: Optimal Comfort and Wellbeing  Outcome: Progressing  Goal: Home and Community Transition Plan Established  Outcome: Progressing  Goal: Rounds/Family Conference  Outcome: Progressing     Problem: Self-Care Deficit  Goal: Improved Ability to Complete Activities of Daily Living  Outcome: Progressing  Intervention: Promote Activity and Functional Independence  Recent Flowsheet Documentation  Taken 07/13/2022 1015 by Enriqueta Shutter, RN  Self-Care Promotion: independence encouraged     Problem: Fall Injury Risk  Goal: Absence of Fall and Fall-Related Injury  Outcome: Progressing  Intervention: Identify and Manage Contributors  Recent Flowsheet Documentation  Taken 07/13/2022 1015 by Enriqueta Shutter, RN  Self-Care Promotion: independence encouraged  Intervention: Promote Injury-Free Environment  Recent Flowsheet Documentation  Taken 07/13/2022 1000 by Enriqueta Shutter, RN  Safety Interventions: fall reduction program maintained  Taken 07/13/2022 0800 by Enriqueta Shutter, RN  Safety Interventions: fall reduction program maintained     Problem: Skin Injury Risk Increased  Goal: Skin Health and Integrity  Outcome: Progressing  Intervention: Optimize Skin Protection  Recent Flowsheet Documentation  Taken 07/13/2022 1015 by Enriqueta Shutter, RN  Pressure Reduction Techniques: frequent weight shift encouraged  Pressure Reduction Devices: positioning supports utilized  Skin Protection: incontinence pads utilized  Taken 07/13/2022 1000 by Enriqueta Shutter, RN  Pressure Reduction Techniques: frequent weight shift encouraged  Pressure Reduction Devices: positioning supports utilized  Skin Protection: incontinence pads utilized  Taken 07/13/2022 0800 by Enriqueta Shutter, RN  Pressure Reduction Techniques: frequent weight shift encouraged  Pressure Reduction Devices: positioning supports utilized  Skin Protection: incontinence pads utilized     Problem: Mobility Impairment  Goal: Optimal Mobility  Outcome: Progressing  Intervention: Optimize Mobility  Recent Flowsheet Documentation  Taken 07/13/2022 1015 by Enriqueta Shutter, RN  Positioning/Transfer Devices: pillows  Taken 07/13/2022 1000 by Enriqueta Shutter, RN  Positioning/Transfer Devices: pillows  Taken 07/13/2022 0800 by Enriqueta Shutter, RN  Positioning/Transfer Devices: pillows     Problem: Stroke Rehabilitation  Goal: Optimal Adjustment to Stroke  Outcome: Progressing  Goal: Optimal  Safe BADL Performance  Outcome: Progressing  Goal: Effective Bowel Elimination/Continence  Outcome: Progressing  Goal: Optimal Cognitive Function  Outcome: Progressing  Goal: Effective Communication Skills  Outcome: Progressing  Goal: Optimal Safe IADL Performance  Outcome: Progressing  Goal: Optimal Mobility Independence and Safety  Outcome: Progressing  Goal: Optimal Movement and Motor Control  Outcome: Progressing  Goal: Optimal Nutrition Intake  Outcome: Progressing  Goal: Optimal Sensory Perceptual Status  Outcome: Progressing  Goal: Maintains Intimacy/Sexual Expression  Outcome: Progressing  Goal: Effective Spasticity Management  Outcome: Progressing  Goal: Safe and Effective Swallow  Outcome: Progressing  Intervention: Optimize Eating and Swallowing  Recent Flowsheet Documentation  Taken 07/13/2022 1000 by Enriqueta Shutter, RN  Aspiration Precautions: awake/alert before oral intake  Taken 07/13/2022 0800 by Enriqueta Shutter, RN  Aspiration Precautions: awake/alert before oral intake  Goal: Effective Urinary Elimination/Continence  Outcome: Progressing

## 2022-07-13 NOTE — Unmapped (Incomplete)
[facility]  Follow-Up Psychiatry Consult Note      Date of admission: 07/04/2022  3:51 PM  Service Date: July 13, 2022  Primary Team: Physical Medicine and Rehabilitation [facility])  LOS:  LOS: 9 days      Assessment:   Warren [name]. is a 47 y.o. 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  Patient was seen in consultation by request of Drusilla Kanner, MD for evaluation of psychosis.     Warren [name]. presents with symptoms consistent with a diagnosis of unspecified psychosis with paranoid delusions that led to noncompliance with medications at home. Patient's report of feeling spirits throughout life, lack of family contact, and chronic emotional lability can be consistent with history of trauma, personality factors, or psychological distress. Complicating the patient's presentation includes recent stroke, advanced HIV, and chronic substance use. It is less likely that the patient's presentation is from a primary psychotic disorder given timeline of delusions (psychosis was not present over 1 year ago). The differential in the setting of advanced HIV and polysubstance use is broad and includes HIV-related neurocognitive disorder and substance-induced psychosis. See initial note for timeline based on collateral.     Today, discussed concern about Qtc with patient. Recommend engaging cardiology for interpretation of Qtc given question of whether it is prolonged with AV paced rhythm. Mr. Vangieson reports feeling that it is easier to 'let go and let God' since starting the medication (with regard to many of the concerns that he has described - being monitored, family's reaction to his inheritance, court case). Denies feeling that risperidone has lowered his motivation to do things he has control over (such as engaging in therapies at [facility]). He would like to continue taking medication, but states that he would not want to assume risk of prolonged QT. Discussed that if cardiology thinks the prolongation is accurate, we would recommend substituting abilify for risperidone. Discussed that abilify is a very similar medication, less likely to prolong QT, though more likely to cause akathisia. He was agreeable with continuing Risperidone pending cardiology's assessment, and switching if there is still concern for QT prolongation vs continuing risperidone if not. Recommend continuing risperidone at the current dose this evening.     Diagnoses:   Active Hospital problems:  Active Problems:    * No active hospital problems. *       Problems edited/added by me:  No problems updated.      Risk Assessment:  ASQ screening result: low risk    -A suicide and violence risk assessment was performed as part of this evaluation. Risk factors for self-harm/suicide: lack of social support, sense of isolation, current substance abuse, history of substance abuse, poor adherence to treatment , recent trauma, and chronic mood lability .  Protective factors against self-harm/suicide:  lack of active SI, no history of previous suicide attempts , supportive friends, enjoyment of leisure actvities, expresses purpose for living, religious or spiritual prohibition to suicide/violence, and safe housing.  Risk factors for harm to others: current substance abuse, active symptoms of psychosis, limited work history, and past substance abuse. Protective factors against  harm to others: no known history of violence towards others, no known history of threats of harm towards others, no active symptoms of mania, no previous acts of violence in current setting, intolerant attititude toward deviance, positive social orientation, and religiosity.     Current suicide risk: low risk  Current homicide risk: low risk    Recommendations:     Safety and Observation Level:   -- This patient does NOT meet involuntary commitment (IVC) criteria. IVC paperwork was never filed. Please contact our team if there is a concern that risk level has changed.  -- Recommend routine observation per unit policy.  -- If the patient attempts to leave against medical advice and it is felt to be unsafe for them to leave, please call a Behavioral Response and page Psychiatry at [phone].    Medications:  -- continue Risperdal po to 1 mg HS    Further Work-up:   -- recommend engaging cardiology with regard to question of QT prolongation  -- While the patient is receiving medications (such as Risperdal) that may prolong QTc and increase risk for torsades:    - MONITOR and KEEP Mg>2 and K>4      - MONITOR QTc regularly.  If QTc on tele strip >442ms, obtain 12-lead EKG.  -- lipid panel and A1c obtained for baseline with antipsychotic treatment  Metabolic Monitoring:  Initial Weight: Weight: 90.7 kg (199 lb 15.3 oz)  Last Weight: Weight: 90.7 kg (199 lb 15.3 oz)  Last BMI: Body mass index is 27.12 kg/m??.  Admit BP: BP: 112/82  Last BP: BP: 131/81  Lipid Panel:   Lab Results   Component Value Date    Cholesterol 165 06/27/2022    LDL Calculated 83 06/27/2022    HDL 36 (L) 06/27/2022    Triglycerides 230 (H) 06/27/2022     Hemoglobin A1C:   Lab Results   Component Value Date    Hemoglobin A1C 4.6 (L) 06/27/2022      Fasting Blood Sugar: No results found for: GLUF    Behavioral / Environmental:   -- Please continue Delirium (prevention) protocol detailed in initial consult note.    Follow-up:  -- When patient is discharged, please ensure that their AVS includes information about the 25 Suicide & Crisis Lifeline.  -- Patient may need extra support once discharged due to cognitive issues, but will defer to case management. Psychotic symptoms should be managed by optimizing medical treatment and abstaining from substances. He would benefit from establishing with [facility] and having neuropsych follow-up.  -- Our team is coordinating with the [facility] to arrange psychiatric follow up within their clinic.   -- We will follow as needed at this time.     Thank you for this consult request. Recommendations have been communicated to the primary team. Please page [phone]  for any questions or concerns.     Discussed with and seen by Attending, Minus Breeding, MD, who agrees with the assessment and plan.    Trevor Iha, MD      Subjective     Relevant events since last seen by psychiatry:  Patient is cleared for SNF or [facility]  MRI discontinued due to pacemaker risk    Neuropsychology consulted from [facility]    QTCf/QTcalc  6/6:  476 ms/508 ms  6/18: 487 ms/521 ms    Cardiology reviewed his EKG - corrected Qtc is about 470. Recommend monitoring weekly with EKG with dose increase of risperidone.    Patient  Interview:     Doing good.  Things are going pretty good with rehab.  Feels like he continues to make progress with therapy - walking better.  Needs to continue to work on his hand.  Sleep is going good - thinks he is getting about 8 hours.  Feels rested when he wakes up.  Appetite is well.  Feeling safe.  No concerns with his care in the hospital.  Looking forward to being able to leave the hospital, but wants to be functional.  Concerned about his arm and wants to make sure he has aftercare to make sure it gets right.    Thinks the medicine is helpful with letting him let go, and let God.   Feels like the Risperdal is helping with therapy as well and feeling more lifted up.    Has figured out the court case - has already been decided.  It was about an inheritance, family member left him some money.    Does not believe he will live in the same place anymore. Not sure where he would go, mentioned maybe another state.  Mentioned the holy ghost has told him that about moving to another state.  Spoke to him quietly and said he was giving me peace over it.  Thinks everything is supposed to be a surprise, it is out of my hand.  Would initially go back to his home, but likely wouldn't be long - said it is all fuzzy and don't have it figured out.  Emphasized it is not official.  Does not need to defend self from anybody.    Denied SI/HI  DOWB: completed without errors     We discussed the risks related to prolonged aspects of his heart rhythm and potential increased risk of arrhythmia.  We discussed options for next steps such as having cardiology weigh in on the EKG in setting of pacemaker and consideration for continuation of Risperdal vs change to Abilify which has less risk of this prolongation.  He would like to know A) if that is the case, and B) if that is the case, would want to go with the option that is lower complications.  Discussed things to look out for if we end up switching to Abilify such as motor restlessness, tremor or movement abnormalities or involuntary movements.     ROS:   All systems reviewed as negative/unremarkable aside from the following pertinent positives and negatives: None    Collateral:   - Reviewed medical records in Epic    Relevant Updates to past psychiatric, medical/surgical, family, or social history: None    Current Medications:  Scheduled Meds:   aspirin  81 mg Oral Daily    atorvastatin  80 mg Oral Daily    bictegrav-emtricit-tenofov ala  1 tablet Oral Daily    metoPROLOL succinate  50 mg Oral Daily    nicotine  1 patch Transdermal Daily    polyethylene glycol  17 g Oral Daily    risperiDONE  1 mg Oral Nightly    warfarin  5 mg Oral Daily       Continuous Infusions:  PRN Meds:.acetaminophen, labetalol, magnesium hydroxide, melatonin     Objective:   Vital signs:   Temp:  [36.5 ??C (97.7 ??F)-36.6 ??C (97.9 ??F)] 36.6 ??C (97.9 ??F)  Heart Rate:  [89-90] 89  Resp:  [16-18] 16  BP: (114-131)/(73-81) 131/81  MAP (mmHg):  [85-94] 94  SpO2:  [97 %-100 %] 97 %    Physical Exam:  Gen: No acute distress.  Pulm: Normal work of breathing.  Neuro/MSK: Bulk thin.  Skin: normal skin tone.    Mental Status Exam:  Appearance:  appears stated age and in bed   Attitude:   calm, cooperative   Behavior/Psychomotor:  appropriate eye contact   Speech/Language:   normal rate, not pressured, normal volume, improved fluency. normal articulation   Mood:  ???okay???   Affect:   Euthymic   Thought process:  linear   Thought content:     Denies self-harm/SI thoughts, denies feeling like he needs to defend himself from others.    Perceptual disturbances:   behavior not concerning for response to internal stimuli   Attention:  able to attend to interview without fluctuations in consciousness   Concentration:  Able to fully concentrate and attend   Orientation:  Oriented to person, place, city, month, year, and situation. and Disoriented to  date.   Memory:  not formally tested, but grossly intact   Fund of knowledge:   not formally assessed   Insight:    Limited   Judgment:   Fair   Impulse Control:  Intact     Relevant laboratory/imaging data was reviewed.    Additional Psychometric Testing:  Not applicable.    Consult Type and Time-Based Documentation:    I was in the hospital.  I spent 20 minutes on the real-time audio and video with the patient for a a consultation. Total time was 30 minutes and over 50% of the service was counseling and/or coordination of care within the patient unit.      The patient and/or parent/guardian has been advised of the potential risks and limitations of this mode of treatment (including, but not limited to, the absence of in-person examination) and has agreed to be treated using telemedicine. The  Patient's, patient's guardian's and/or patient's family's questions regarding telemedicine have been answered.

## 2022-07-14 LAB — PROTIME-INR
INR: 2.62
PROTIME: 28.3 s — ABNORMAL HIGH (ref 9.9–12.6)

## 2022-07-14 MED ADMIN — atorvastatin (LIPITOR) tablet 80 mg: 80 mg | ORAL | @ 12:00:00

## 2022-07-14 MED ADMIN — bictegrav-emtricit-tenofov ala (BIKTARVY) 50-200-25 mg tablet 1 tablet: 1 | ORAL | @ 12:00:00

## 2022-07-14 MED ADMIN — warfarin (JANTOVEN) tablet 2.5 mg: 2.5 mg | ORAL | @ 21:00:00 | Stop: 2022-07-14

## 2022-07-14 MED ADMIN — polyethylene glycol (MIRALAX) packet 17 g: 17 g | ORAL | @ 12:00:00

## 2022-07-14 MED ADMIN — risperiDONE (RisperDAL) tablet 1 mg: 1 mg | ORAL

## 2022-07-14 MED ADMIN — nicotine (NICODERM CQ) 14 mg/24 hr patch 1 patch: 1 | TRANSDERMAL | @ 13:00:00

## 2022-07-14 NOTE — Unmapped (Signed)
Patient is alert and oriented x4. Continent of bladder and bowel. No c/o pain. Skin remains intact, no new skin issues noted, pt self turns. Bed alarm remains on. R sided weakness. Aspiration, safety and fall precautions maintained. Call bell and belongings within reach, bed low and locked.   Voids in urinal .  Will continue to monitor.         Problem: Rehabilitation ([facility]) Plan of Care  Goal: Absence of New-Onset Illness or Injury  Intervention: Prevent Fall and Fall Injury  Recent Flowsheet Documentation  Taken 07/14/2022 0200 by Rudell Cobb, RN  Safety Interventions: fall reduction program maintained  Taken 07/14/2022 0002 by Rudell Cobb, RN  Safety Interventions: fall reduction program maintained  Taken 07/13/2022 2200 by Rudell Cobb, RN  Safety Interventions: fall reduction program maintained  Taken 07/13/2022 2000 by Rudell Cobb, RN  Safety Interventions: fall reduction program maintained  Intervention: Prevent Skin Injury  Recent Flowsheet Documentation  Taken 07/14/2022 0200 by Rudell Cobb, RN  Device Skin Pressure Protection: absorbent pad utilized/changed  Skin Protection: incontinence pads utilized  Taken 07/14/2022 0002 by Rudell Cobb, RN  Device Skin Pressure Protection: absorbent pad utilized/changed  Skin Protection: incontinence pads utilized  Taken 07/13/2022 2230 by Rudell Cobb, RN  Device Skin Pressure Protection: absorbent pad utilized/changed  Skin Protection: incontinence pads utilized  Taken 07/13/2022 2200 by Rudell Cobb, RN  Device Skin Pressure Protection: absorbent pad utilized/changed  Skin Protection: incontinence pads utilized  Taken 07/13/2022 2000 by Rudell Cobb, RN  Device Skin Pressure Protection: absorbent pad utilized/changed  Skin Protection: incontinence pads utilized  Intervention: Prevent VTE (Venous Thromboembolism)  Recent Flowsheet Documentation  Taken 07/13/2022 2230 by Rudell Cobb, RN  VTE Prevention/Management: anticoagulant therapy  Anti-Embolism Intervention: (Warfarin) Other (Comment)     Problem: Fall Injury Risk  Goal: Absence of Fall and Fall-Related Injury  Intervention: Identify and Manage Contributors  Recent Flowsheet Documentation  Taken 07/13/2022 2230 by Rudell Cobb, RN  Self-Care Promotion: independence encouraged  Intervention: Promote Injury-Free Environment  Recent Flowsheet Documentation  Taken 07/14/2022 0200 by Rudell Cobb, RN  Safety Interventions: fall reduction program maintained  Taken 07/14/2022 0002 by Rudell Cobb, RN  Safety Interventions: fall reduction program maintained  Taken 07/13/2022 2200 by Rudell Cobb, RN  Safety Interventions: fall reduction program maintained  Taken 07/13/2022 2000 by Rudell Cobb, RN  Safety Interventions: fall reduction program maintained     Problem: Mobility Impairment  Goal: Optimal Mobility  Intervention: Optimize Mobility  Recent Flowsheet Documentation  Taken 07/13/2022 2230 by Rudell Cobb, RN  Positioning/Transfer Devices: pillows  Taken 07/13/2022 2200 by Rudell Cobb, RN  Positioning/Transfer Devices: pillows  Taken 07/13/2022 2000 by Rudell Cobb, RN  Positioning/Transfer Devices: pillows     Problem: Stroke Rehabilitation  Goal: Safe and Effective Swallow  Intervention: Optimize Eating and Swallowing  Recent Flowsheet Documentation  Taken 07/14/2022 0200 by Rudell Cobb, RN  Aspiration Precautions: awake/alert before oral intake  Taken 07/14/2022 0002 by Rudell Cobb, RN  Aspiration Precautions: awake/alert before oral intake  Taken 07/13/2022 2200 by Rudell Cobb, RN  Aspiration Precautions: awake/alert before oral intake  Taken 07/13/2022 2000 by Rudell Cobb, RN  Aspiration Precautions: awake/alert before oral intake

## 2022-07-14 NOTE — Unmapped (Signed)
Warfarin Therapeutic Monitoring Pharmacy Note    Warren [name] is a 47 y.o. male continuing warfarin.     Indication: bioprosthetic aortic valve replacement    Prior Dosing Information: Current regimen 5 mg po daily       Source(s) of information used to determine prior to admission dosing: Fill HIstory    Goals:  Therapeutic Drug Levels  INR range: 1.5-2    Additional Clinical Monitoring/Outcomes  Monitor hemoglobin and platelets  Monitor for signs and symptoms of bleeding  Monitor liver function (LFTs, bilirubin)    Results:  Lab Results   Component Value Date    INR 2.62 07/14/2022    INR 1.88 07/11/2022    INR 1.99 07/10/2022       Pharmacokinetic Considerations and Significant Drug Interactions:   Drug Interactions  not applicable    Bridge Therapy  None required    Concurrent Antiplatelet Medications  aspirin    Assessment/Plan:  Recommendation(s)  INR is supratherapeutic.  Give 2.5 mg po tonight and then continue with 5 mg po daily.    Follow-up  Next INR to be obtained: daily with AM labs    A pharmacist will continue to monitor and recommend INRs/dose changes as appropriate    Please page service pharmacist with questions/clarifications.    Burnard Bunting, PharmD
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
## 2022-07-14 NOTE — Unmapped (Signed)
Patient is alert and oriented x4. Room air. Denies pain. Continent on bowel and bladder. Can take pills whole with water. Aspiration precautions. Fall precautions.     Problem: Rehabilitation (IRF) Plan of Care  Goal: Plan of Care Review  Outcome: Progressing  Goal: Patient-Specific Goal (Individualized)  Outcome: Progressing  Goal: Absence of New-Onset Illness or Injury  Outcome: Progressing  Intervention: Prevent Fall and Fall Injury  Recent Flowsheet Documentation  Taken 07/14/2022 1200 by Enriqueta Shutter, RN  Safety Interventions: fall reduction program maintained  Taken 07/14/2022 1000 by Enriqueta Shutter, RN  Safety Interventions: fall reduction program maintained  Taken 07/14/2022 0800 by Enriqueta Shutter, RN  Safety Interventions: fall reduction program maintained  Intervention: Prevent Skin Injury  Recent Flowsheet Documentation  Taken 07/14/2022 1200 by Enriqueta Shutter, RN  Device Skin Pressure Protection: absorbent pad utilized/changed  Skin Protection: incontinence pads utilized  Taken 07/14/2022 1000 by Enriqueta Shutter, RN  Device Skin Pressure Protection: absorbent pad utilized/changed  Skin Protection: incontinence pads utilized  Taken 07/14/2022 0945 by Enriqueta Shutter, RN  Device Skin Pressure Protection: absorbent pad utilized/changed  Skin Protection: incontinence pads utilized  Taken 07/14/2022 0800 by Enriqueta Shutter, RN  Device Skin Pressure Protection: absorbent pad utilized/changed  Skin Protection: incontinence pads utilized  Intervention: Prevent Infection  Recent Flowsheet Documentation  Taken 07/14/2022 1200 by Enriqueta Shutter, RN  Infection Prevention: environmental surveillance performed  Taken 07/14/2022 1000 by Enriqueta Shutter, RN  Infection Prevention: environmental surveillance performed  Taken 07/14/2022 0800 by Enriqueta Shutter, RN  Infection Prevention: environmental surveillance performed  Intervention: Prevent VTE (Venous Thromboembolism)  Recent Flowsheet Documentation  Taken 07/14/2022 0945 by Enriqueta Shutter, RN  VTE Prevention/Management: ambulation promoted  Anti-Embolism Intervention: (warfarin) Other (Comment)  Goal: Optimal Comfort and Wellbeing  Outcome: Progressing  Goal: Home and Community Transition Plan Established  Outcome: Progressing  Goal: Rounds/Family Conference  Outcome: Progressing     Problem: Rehabilitation (IRF) Plan of Care  Goal: Absence of New-Onset Illness or Injury  Intervention: Prevent Skin Injury  Recent Flowsheet Documentation  Taken 07/14/2022 1200 by Enriqueta Shutter, RN  Device Skin Pressure Protection: absorbent pad utilized/changed  Skin Protection: incontinence pads utilized  Taken 07/14/2022 1000 by Enriqueta Shutter, RN  Device Skin Pressure Protection: absorbent pad utilized/changed  Skin Protection: incontinence pads utilized  Taken 07/14/2022 0945 by Enriqueta Shutter, RN  Device Skin Pressure Protection: absorbent pad utilized/changed  Skin Protection: incontinence pads utilized  Taken 07/14/2022 0800 by Enriqueta Shutter, RN  Device Skin Pressure Protection: absorbent pad utilized/changed  Skin Protection: incontinence pads utilized     Problem: Rehabilitation (IRF) Plan of Care  Goal: Absence of New-Onset Illness or Injury  Intervention: Prevent Infection  Recent Flowsheet Documentation  Taken 07/14/2022 1200 by Enriqueta Shutter, RN  Infection Prevention: environmental surveillance performed  Taken 07/14/2022 1000 by Enriqueta Shutter, RN  Infection Prevention: environmental surveillance performed  Taken 07/14/2022 0800 by Enriqueta Shutter, RN  Infection Prevention: environmental surveillance performed     Problem: Self-Care Deficit  Goal: Improved Ability to Complete Activities of Daily Living  Outcome: Progressing  Intervention: Promote Activity and Functional Independence  Recent Flowsheet Documentation  Taken 07/14/2022 0945 by Enriqueta Shutter, RN  Self-Care Promotion: independence encouraged     Problem: Fall Injury Risk  Goal: Absence of Fall and Fall-Related Injury  Outcome: Progressing  Intervention: Identify and Manage Contributors  Recent Flowsheet Documentation  Taken 07/14/2022  0945 by Enriqueta Shutter, RN  Self-Care Promotion: independence encouraged  Intervention: Promote Injury-Free Environment  Recent Flowsheet Documentation  Taken 07/14/2022 1200 by Enriqueta Shutter, RN  Safety Interventions: fall reduction program maintained  Taken 07/14/2022 1000 by Enriqueta Shutter, RN  Safety Interventions: fall reduction program maintained  Taken 07/14/2022 0800 by Enriqueta Shutter, RN  Safety Interventions: fall reduction program maintained     Problem: Fall Injury Risk  Goal: Absence of Fall and Fall-Related Injury  Outcome: Progressing  Intervention: Identify and Manage Contributors  Recent Flowsheet Documentation  Taken 07/14/2022 0945 by Enriqueta Shutter, RN  Self-Care Promotion: independence encouraged  Intervention: Promote Injury-Free Environment  Recent Flowsheet Documentation  Taken 07/14/2022 1200 by Enriqueta Shutter, RN  Safety Interventions: fall reduction program maintained  Taken 07/14/2022 1000 by Enriqueta Shutter, RN  Safety Interventions: fall reduction program maintained  Taken 07/14/2022 0800 by Enriqueta Shutter, RN  Safety Interventions: fall reduction program maintained

## 2022-07-15 MED ADMIN — risperiDONE (RisperDAL) tablet 1 mg: 1 mg | ORAL | @ 01:00:00

## 2022-07-15 MED ADMIN — nicotine (NICODERM CQ) 14 mg/24 hr patch 1 patch: 1 | TRANSDERMAL | @ 13:00:00

## 2022-07-15 MED ADMIN — atorvastatin (LIPITOR) tablet 80 mg: 80 mg | ORAL | @ 13:00:00

## 2022-07-15 MED ADMIN — warfarin (JANTOVEN) tablet 5 mg: 5 mg | ORAL | @ 21:00:00

## 2022-07-15 MED ADMIN — metoPROLOL succinate (Toprol-XL) 24 hr tablet 50 mg: 50 mg | ORAL | @ 13:00:00

## 2022-07-15 MED ADMIN — bictegrav-emtricit-tenofov ala (BIKTARVY) 50-200-25 mg tablet 1 tablet: 1 | ORAL | @ 13:00:00

## 2022-07-15 MED ADMIN — aspirin chewable tablet 81 mg: 81 mg | ORAL | @ 13:00:00
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
## 2022-07-15 NOTE — Unmapped (Signed)
Patient is alert and oriented x4. Continent of bladder and bowel. No c/o pain. Skin remains intact, no new skin issues noted, pt self turns. Bed alarm remains on. R sided weakness. Aspiration, safety and fall precautions maintained. Call bell and belongings within reach, bed low and locked.   Voids in urinal .  Will continue to monitor.         Problem: Rehabilitation (IRF) Plan of Care  Goal: Absence of New-Onset Illness or Injury  Intervention: Prevent Fall and Fall Injury  Recent Flowsheet Documentation  Taken 07/15/2022 0200 by Rudell Cobb, RN  Safety Interventions: fall reduction program maintained  Taken 07/15/2022 0000 by Rudell Cobb, RN  Safety Interventions: fall reduction program maintained  Taken 07/14/2022 2200 by Rudell Cobb, RN  Safety Interventions: fall reduction program maintained  Taken 07/14/2022 2013 by Rudell Cobb, RN  Safety Interventions: fall reduction program maintained  Intervention: Prevent Skin Injury  Recent Flowsheet Documentation  Taken 07/15/2022 0200 by Rudell Cobb, RN  Skin Protection: incontinence pads utilized  Taken 07/14/2022 2200 by Rudell Cobb, RN  Device Skin Pressure Protection: absorbent pad utilized/changed  Skin Protection: incontinence pads utilized  Taken 07/14/2022 2013 by Rudell Cobb, RN  Device Skin Pressure Protection: absorbent pad utilized/changed  Skin Protection: incontinence pads utilized  Intervention: Prevent Infection  Recent Flowsheet Documentation  Taken 07/15/2022 0000 by Rudell Cobb, RN  Infection Prevention:   hand hygiene promoted   rest/sleep promoted  Taken 07/14/2022 2200 by Rudell Cobb, RN  Infection Prevention:   hand hygiene promoted   rest/sleep promoted  Taken 07/14/2022 2013 by Rudell Cobb, RN  Infection Prevention: hand hygiene promoted  Intervention: Prevent VTE (Venous Thromboembolism)  Recent Flowsheet Documentation  Taken 07/15/2022 0200 by Rudell Cobb, RN  Anti-Embolism Intervention: Refused  Anti-Embolism Device Location: BLE  Taken 07/15/2022 0000 by Rudell Cobb, RN  Anti-Embolism Intervention: Refused  Anti-Embolism Device Location: BLE  Taken 07/14/2022 2200 by Rudell Cobb, RN  Anti-Embolism Intervention: Refused  Anti-Embolism Device Location: BLE  Taken 07/14/2022 2013 by Rudell Cobb, RN  Anti-Embolism Intervention: Refused     Problem: Fall Injury Risk  Goal: Absence of Fall and Fall-Related Injury  Intervention: Promote Injury-Free Environment  Recent Flowsheet Documentation  Taken 07/15/2022 0200 by Rudell Cobb, RN  Safety Interventions: fall reduction program maintained  Taken 07/15/2022 0000 by Rudell Cobb, RN  Safety Interventions: fall reduction program maintained  Taken 07/14/2022 2200 by Rudell Cobb, RN  Safety Interventions: fall reduction program maintained  Taken 07/14/2022 2013 by Rudell Cobb, RN  Safety Interventions: fall reduction program maintained     Problem: Stroke Rehabilitation  Goal: Safe and Effective Swallow  Intervention: Optimize Eating and Swallowing  Recent Flowsheet Documentation  Taken 07/15/2022 0200 by Rudell Cobb, RN  Aspiration Precautions: awake/alert before oral intake  Taken 07/15/2022 0000 by Rudell Cobb, RN  Aspiration Precautions: awake/alert before oral intake  Taken 07/14/2022 2200 by Rudell Cobb, RN  Aspiration Precautions: awake/alert before oral intake  Taken 07/14/2022 2013 by Rudell Cobb, RN  Aspiration Precautions: awake/alert before oral intake

## 2022-07-15 NOTE — Unmapped (Signed)
Patient is alert and oriented x4. Continent of bladder and bowel. Pain is well controlled on current regimen. No new skin issues noted. Safety and fall precautions in place. Call bell and belongings within reach. No falls or injuries, this shift. No changes to the plan of care at this time. Patient is hoping to discharge on Monday for a court date. Request passed to covering team.    Problem: Rehabilitation (IRF) Plan of Care  Goal: Plan of Care Review  Outcome: Progressing  Flowsheets (Taken 07/15/2022 1644)  Progress: improving  Plan of Care Reviewed With: patient  Goal: Patient-Specific Goal (Individualized)  Outcome: Progressing  Goal: Absence of New-Onset Illness or Injury  Outcome: Progressing  Intervention: Prevent Fall and Fall Injury  Recent Flowsheet Documentation  Taken 07/15/2022 1630 by Wyvonna Plum, RN  Safety Interventions:   fall reduction program maintained   bed alarm  Taken 07/15/2022 1400 by Wyvonna Plum, RN  Safety Interventions: fall reduction program maintained  Taken 07/15/2022 1200 by Wyvonna Plum, RN  Safety Interventions: fall reduction program maintained  Taken 07/15/2022 1000 by Wyvonna Plum, RN  Safety Interventions:   fall reduction program maintained   bed alarm  Taken 07/15/2022 0800 by Wyvonna Plum, RN  Safety Interventions:   bed alarm   fall reduction program maintained  Goal: Optimal Comfort and Wellbeing  Outcome: Progressing  Goal: Home and Community Transition Plan Established  Outcome: Progressing  Goal: Rounds/Family Conference  Outcome: Progressing     Problem: Self-Care Deficit  Goal: Improved Ability to Complete Activities of Daily Living  Outcome: Progressing     Problem: Fall Injury Risk  Goal: Absence of Fall and Fall-Related Injury  Outcome: Progressing  Intervention: Promote Injury-Free Environment  Recent Flowsheet Documentation  Taken 07/15/2022 1630 by Wyvonna Plum, RN  Safety Interventions:   fall reduction program maintained   bed alarm  Taken 07/15/2022 1400 by Wyvonna Plum, RN  Safety Interventions: fall reduction program maintained  Taken 07/15/2022 1200 by Wyvonna Plum, RN  Safety Interventions: fall reduction program maintained  Taken 07/15/2022 1000 by Wyvonna Plum, RN  Safety Interventions:   fall reduction program maintained   bed alarm  Taken 07/15/2022 0800 by Wyvonna Plum, RN  Safety Interventions:   bed alarm   fall reduction program maintained     Problem: Skin Injury Risk Increased  Goal: Skin Health and Integrity  Outcome: Progressing  Intervention: Optimize Skin Protection  Recent Flowsheet Documentation  Taken 07/15/2022 1630 by Wyvonna Plum, RN  Pressure Reduction Techniques: frequent weight shift encouraged  Taken 07/15/2022 1400 by Wyvonna Plum, RN  Pressure Reduction Techniques: frequent weight shift encouraged  Taken 07/15/2022 1200 by Wyvonna Plum, RN  Pressure Reduction Techniques: frequent weight shift encouraged  Taken 07/15/2022 1000 by Wyvonna Plum, RN  Pressure Reduction Techniques: frequent weight shift encouraged  Taken 07/15/2022 0800 by Wyvonna Plum, RN  Pressure Reduction Techniques: frequent weight shift encouraged     Problem: Mobility Impairment  Goal: Optimal Mobility  Outcome: Progressing     Problem: Stroke Rehabilitation  Goal: Optimal Adjustment to Stroke  Outcome: Progressing  Goal: Optimal Safe BADL Performance  Outcome: Progressing  Goal: Effective Bowel Elimination/Continence  Outcome: Progressing  Goal: Optimal Cognitive Function  Outcome: Progressing  Goal: Effective Communication Skills  Outcome: Progressing  Goal: Optimal Safe IADL Performance  Outcome: Progressing  Goal: Optimal Mobility Independence and Safety  Outcome: Progressing  Goal: Optimal Movement and Motor Control  Outcome: Progressing  Goal: Optimal Nutrition Intake  Outcome: Progressing  Goal:  Optimal Sensory Perceptual Status  Outcome: Progressing  Goal: Maintains Intimacy/Sexual Expression  Outcome: Progressing  Goal: Effective Spasticity Management  Outcome: Progressing  Goal: Safe and Effective Swallow  Outcome: Progressing  Goal: Effective Urinary Elimination/Continence  Outcome: Progressing

## 2022-07-16 LAB — BASIC METABOLIC PANEL
ANION GAP: 6 mmol/L (ref 5–14)
BLOOD UREA NITROGEN: 10 mg/dL (ref 9–23)
BUN / CREAT RATIO: 10
CALCIUM: 9.3 mg/dL (ref 8.7–10.4)
CHLORIDE: 108 mmol/L — ABNORMAL HIGH (ref 98–107)
CO2: 25.7 mmol/L (ref 20.0–31.0)
CREATININE: 0.97 mg/dL
EGFR CKD-EPI (2021) MALE: >90 mL/min/{1.73_m2} (ref >=60)
GLUCOSE RANDOM: 87 mg/dL (ref 70–179)
POTASSIUM: 4.5 mmol/L (ref 3.4–4.8)
SODIUM: 140 mmol/L (ref 135–145)

## 2022-07-16 LAB — MAGNESIUM: MAGNESIUM: 1.8 mg/dL (ref 1.6–2.6)

## 2022-07-16 LAB — CBC
HEMATOCRIT: 43.6 % (ref 39.0–48.0)
HEMOGLOBIN: 14.3 g/dL (ref 12.9–16.5)
MEAN CORPUSCULAR HEMOGLOBIN CONC: 32.9 g/dL (ref 32.0–36.0)
MEAN CORPUSCULAR HEMOGLOBIN: 29.3 pg (ref 25.9–32.4)
MEAN CORPUSCULAR VOLUME: 89.2 fL (ref 77.6–95.7)
MEAN PLATELET VOLUME: 7.1 fL (ref 6.8–10.7)
PLATELET COUNT: 339 10*9/L (ref 150–450)
RED BLOOD CELL COUNT: 4.88 10*12/L (ref 4.26–5.60)
RED CELL DISTRIBUTION WIDTH: 12.6 % (ref 12.2–15.2)
WBC ADJUSTED: 5.6 10*9/L (ref 3.6–11.2)

## 2022-07-16 LAB — PROTIME-INR
INR: 2.86
PROTIME: 30.8 s — ABNORMAL HIGH (ref 9.9–12.6)

## 2022-07-16 LAB — PHOSPHORUS: PHOSPHORUS: 3.8 mg/dL (ref 2.4–5.1)

## 2022-07-16 MED ADMIN — aspirin chewable tablet 81 mg: 81 mg | ORAL | @ 12:00:00

## 2022-07-16 MED ADMIN — bictegrav-emtricit-tenofov ala (BIKTARVY) 50-200-25 mg tablet 1 tablet: 1 | ORAL | @ 12:00:00

## 2022-07-16 MED ADMIN — risperiDONE (RisperDAL) tablet 1 mg: 1 mg | ORAL

## 2022-07-16 MED ADMIN — atorvastatin (LIPITOR) tablet 80 mg: 80 mg | ORAL | @ 12:00:00

## 2022-07-16 MED ADMIN — nicotine (NICODERM CQ) 14 mg/24 hr patch 1 patch: 1 | TRANSDERMAL | @ 12:00:00
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
## 2022-07-16 NOTE — Unmapped (Signed)
Patient is alert and oriented x4. Continent of bladder and bowel. No c/o pain. Skin remains intact, no new skin issues noted, pt self turns. Bed alarm remains on. R sided weakness. Aspiration, safety and fall precautions maintained. Call bell and belongings within reach, bed low and locked.   Voids in urinal .  Will continue to monitor  and give report to next shift.           Problem: Rehabilitation (IRF) Plan of Care  Goal: Absence of New-Onset Illness or Injury  Intervention: Prevent Fall and Fall Injury  Recent Flowsheet Documentation  Taken 07/16/2022 0200 by Rudell Cobb, RN  Safety Interventions: fall reduction program maintained  Taken 07/16/2022 0000 by Rudell Cobb, RN  Safety Interventions: fall reduction program maintained  Taken 07/15/2022 2200 by Rudell Cobb, RN  Safety Interventions: fall reduction program maintained  Taken 07/15/2022 2000 by Rudell Cobb, RN  Safety Interventions: fall reduction program maintained  Intervention: Prevent Skin Injury  Recent Flowsheet Documentation  Taken 07/16/2022 0200 by Rudell Cobb, RN  Device Skin Pressure Protection: absorbent pad utilized/changed  Skin Protection: incontinence pads utilized  Taken 07/15/2022 2200 by Rudell Cobb, RN  Device Skin Pressure Protection: absorbent pad utilized/changed  Skin Protection: incontinence pads utilized  Taken 07/15/2022 2000 by Rudell Cobb, RN  Device Skin Pressure Protection: absorbent pad utilized/changed  Skin Protection: incontinence pads utilized  Taken 07/15/2022 1930 by Rudell Cobb, RN  Device Skin Pressure Protection: absorbent pad utilized/changed  Skin Protection: incontinence pads utilized  Intervention: Prevent Infection  Recent Flowsheet Documentation  Taken 07/16/2022 0200 by Rudell Cobb, RN  Infection Prevention:   hand hygiene promoted   rest/sleep promoted  Taken 07/16/2022 0000 by Rudell Cobb, RN  Infection Prevention:   hand hygiene promoted   rest/sleep promoted  Taken 07/15/2022 2200 by Rudell Cobb, RN  Infection Prevention:   hand hygiene promoted   rest/sleep promoted  Taken 07/15/2022 2000 by Rudell Cobb, RN  Infection Prevention:   hand hygiene promoted   rest/sleep promoted  Intervention: Prevent VTE (Venous Thromboembolism)  Recent Flowsheet Documentation  Taken 07/15/2022 1930 by Rudell Cobb, RN  VTE Prevention/Management: anticoagulant therapy  Anti-Embolism Intervention: Other (Comment)  Anti-Embolism Device Location: BLE     Problem: Fall Injury Risk  Goal: Absence of Fall and Fall-Related Injury  Intervention: Identify and Manage Contributors  Recent Flowsheet Documentation  Taken 07/15/2022 1930 by Rudell Cobb, RN  Self-Care Promotion: independence encouraged  Intervention: Promote Injury-Free Environment  Recent Flowsheet Documentation  Taken 07/16/2022 0200 by Rudell Cobb, RN  Safety Interventions: fall reduction program maintained  Taken 07/16/2022 0000 by Rudell Cobb, RN  Safety Interventions: fall reduction program maintained  Taken 07/15/2022 2200 by Rudell Cobb, RN  Safety Interventions: fall reduction program maintained  Taken 07/15/2022 2000 by Rudell Cobb, RN  Safety Interventions: fall reduction program maintained     Problem: Skin Injury Risk Increased  Goal: Skin Health and Integrity  Intervention: Optimize Skin Protection  Recent Flowsheet Documentation  Taken 07/16/2022 0200 by Rudell Cobb, RN  Pressure Reduction Techniques:   frequent weight shift encouraged   heels elevated off bed  Head of Bed (HOB) Positioning: HOB at 30-45 degrees  Pressure Reduction Devices: positioning supports utilized  Skin Protection: incontinence pads utilized  Taken 07/15/2022 2200 by Rudell Cobb, RN  Pressure Reduction Techniques:   frequent weight shift encouraged   heels elevated off bed  Head of Bed (HOB) Positioning: HOB at 30-45 degrees  Pressure Reduction Devices: positioning supports utilized  Skin Protection: incontinence pads utilized  Taken 07/15/2022 2000 by Rudell Cobb, RN  Pressure Reduction Techniques:   frequent weight shift encouraged   heels elevated off bed  Head of Bed (HOB) Positioning: HOB at 30 degrees  Pressure Reduction Devices: positioning supports utilized  Skin Protection: incontinence pads utilized  Taken 07/15/2022 1930 by Rudell Cobb, RN  Pressure Reduction Techniques: frequent weight shift encouraged  Pressure Reduction Devices: positioning supports utilized  Skin Protection: incontinence pads utilized     Problem: Mobility Impairment  Goal: Optimal Mobility  Intervention: Optimize Mobility  Recent Flowsheet Documentation  Taken 07/15/2022 2200 by Rudell Cobb, RN  Positioning/Transfer Devices: pillows  Taken 07/15/2022 2000 by Rudell Cobb, RN  Positioning/Transfer Devices: pillows  Taken 07/15/2022 1930 by Rudell Cobb, RN  Positioning/Transfer Devices: pillows

## 2022-07-16 NOTE — Unmapped (Signed)
Warfarin Therapeutic Monitoring Pharmacy Note    Warren [name] is a 47 y.o. male continuing warfarin.     Indication: bioprosthetic aortic valve replacement    Prior Dosing Information: Current regimen 5 mg po daily       Source(s) of information used to determine prior to admission dosing: Fill HIstory    Goals:  Therapeutic Drug Levels  INR range: 1.5-2    Additional Clinical Monitoring/Outcomes  Monitor hemoglobin and platelets  Monitor for signs and symptoms of bleeding  Monitor liver function (LFTs, bilirubin)    Results:  Lab Results   Component Value Date    INR 2.86 07/16/2022    INR 2.62 07/14/2022    INR 1.88 07/11/2022       Pharmacokinetic Considerations and Significant Drug Interactions:   Drug Interactions  not applicable    Bridge Therapy  None required    Concurrent Antiplatelet Medications  aspirin    Assessment/Plan:  Recommendation(s)  INR is supratherapeutic.  Skip tonights dose and reduce to 4mg  po daily starting tomorrow.    Follow-up  Next INR to be obtained: daily with AM labs    A pharmacist will continue to monitor and recommend INRs/dose changes as appropriate    Please page service pharmacist with questions/clarifications.    Kathlen Brunswick, PharmD

## 2022-07-17 LAB — PROTIME-INR
INR: 2.15
PROTIME: 23.3 s — ABNORMAL HIGH (ref 9.9–12.6)

## 2022-07-17 MED ADMIN — aspirin chewable tablet 81 mg: 81 mg | ORAL | @ 13:00:00

## 2022-07-17 MED ADMIN — metoPROLOL succinate (Toprol-XL) 24 hr tablet 50 mg: 50 mg | ORAL | @ 13:00:00

## 2022-07-17 MED ADMIN — atorvastatin (LIPITOR) tablet 80 mg: 80 mg | ORAL | @ 13:00:00

## 2022-07-17 MED ADMIN — bictegrav-emtricit-tenofov ala (BIKTARVY) 50-200-25 mg tablet 1 tablet: 1 | ORAL | @ 13:00:00

## 2022-07-17 MED ADMIN — nicotine (NICODERM CQ) 14 mg/24 hr patch 1 patch: 1 | TRANSDERMAL | @ 13:00:00

## 2022-07-17 MED ADMIN — warfarin (JANTOVEN) tablet 4 mg: 4 mg | ORAL

## 2022-07-17 MED ADMIN — risperiDONE (RisperDAL) tablet 1 mg: 1 mg | ORAL | @ 01:00:00

## 2022-07-17 NOTE — Unmapped (Signed)
Patient is alert and oriented x4. Continent of bladder and bowel. Pain is well controlled on current regimen. No new skin issues noted. Safety and fall precautions in place. Call bell and belongings within reach. No falls or injuries, this shift. No changes to the plan of care at this time.   Problem: Rehabilitation (IRF) Plan of Care  Goal: Plan of Care Review  Outcome: Progressing  Goal: Patient-Specific Goal (Individualized)  Outcome: Progressing  Goal: Absence of New-Onset Illness or Injury  Outcome: Progressing  Intervention: Prevent Fall and Fall Injury  Recent Flowsheet Documentation  Taken 07/16/2022 1200 by Wyvonna Plum, RN  Safety Interventions: fall reduction program maintained  Taken 07/16/2022 0800 by Wyvonna Plum, RN  Safety Interventions: fall reduction program maintained  Goal: Optimal Comfort and Wellbeing  Outcome: Progressing  Goal: Home and Community Transition Plan Established  Outcome: Progressing  Goal: Rounds/Family Conference  Outcome: Progressing     Problem: Self-Care Deficit  Goal: Improved Ability to Complete Activities of Daily Living  Outcome: Progressing     Problem: Fall Injury Risk  Goal: Absence of Fall and Fall-Related Injury  Outcome: Progressing  Intervention: Promote Injury-Free Environment  Recent Flowsheet Documentation  Taken 07/16/2022 1200 by Wyvonna Plum, RN  Safety Interventions: fall reduction program maintained  Taken 07/16/2022 0800 by Wyvonna Plum, RN  Safety Interventions: fall reduction program maintained     Problem: Skin Injury Risk Increased  Goal: Skin Health and Integrity  Outcome: Progressing  Intervention: Optimize Skin Protection  Recent Flowsheet Documentation  Taken 07/16/2022 1200 by Wyvonna Plum, RN  Pressure Reduction Techniques: frequent weight shift encouraged  Taken 07/16/2022 0800 by Wyvonna Plum, RN  Pressure Reduction Techniques: frequent weight shift encouraged     Problem: Mobility Impairment  Goal: Optimal Mobility  Outcome: Progressing     Problem: Stroke Rehabilitation  Goal: Optimal Adjustment to Stroke  Outcome: Progressing  Goal: Optimal Safe BADL Performance  Outcome: Progressing  Goal: Effective Bowel Elimination/Continence  Outcome: Progressing  Goal: Optimal Cognitive Function  Outcome: Progressing  Goal: Effective Communication Skills  Outcome: Progressing  Goal: Optimal Safe IADL Performance  Outcome: Progressing  Goal: Optimal Mobility Independence and Safety  Outcome: Progressing  Goal: Optimal Movement and Motor Control  Outcome: Progressing  Goal: Optimal Nutrition Intake  Outcome: Progressing  Goal: Optimal Sensory Perceptual Status  Outcome: Progressing  Goal: Maintains Intimacy/Sexual Expression  Outcome: Progressing  Goal: Effective Spasticity Management  Outcome: Progressing  Goal: Safe and Effective Swallow  Outcome: Progressing  Goal: Effective Urinary Elimination/Continence  Outcome: Progressing
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
## 2022-07-17 NOTE — Unmapped (Addendum)
Prosthetics and Orthotics Inpatient Encounter    Consult: R WalkOn Medium AFO    Requesting Service: PM&R      Purpose of Consult/encounter     Warren [name]. was seen while inpatient for fitting and delivery of R WalkOn AFO.       Pertinent History     Trial of AFO while in PT.       Actions taken today     Nethan was provided with a R md [place] today. AFO was fit into patient's shoe without incident. Evelio ambulated with device with walker in room and demonstrated appropriate swing clearance. Mild knee extension thrust at midstance noted. Reviewed AFO design and purpose of device. Lealand confirmed comfort with device and satisfaction with function. AFO fitting and functioning appropriately at this time. Encouraged a gradual break-in process, skin checks, and to contact P&O if any adjustments are needed.       Device Data    Base Device  Quantity: 1  Side: Right  Size: Medium  Description: WalkOn PLS carbon fiber AFO  Part Number: [id number]  Serial Number: [id number]  Supplier: [employer]  Warranty: 1 Year  Action: Delivered                                                               Device checked for safety and security during the visit today.      Instructions provided:   [x]  Device purpose   [x]  How to put on, take off and wear the device  [x]  Cleaning / Maintenance  [x]  Risks / Benefits of wearing   [x]  Instructions to contact should issues arise      Plan    Follow up actions:     Return Visit:  as needed    Wendall Stade, [facility]    *For brace related issues please secure chat Clinical cytogeneticist and Orthotics Clinical Team) or page Prosthetics & Orthotics @ [phone]  *For urgent needs during after-hours or weekends, please page the on-call Prosthetics & Orthotics provider.    Inpatient Instructions:     Custom Fit Spiral AFO  [employer]    Purpose/Function  A Spiral Ankle Foot Orthosis (AFO) is a common brace meant to support the foot and ankle experiencing drop foot.  It is light weight and durable because of its carbon fiber materials.  The spiral design makes it very easy to fit into most footwear and the single strap near the calf makes it very convenient to put on.  The image to the right is an example of a spiral AFO.         Putting device on / taking it off  The AFO should be used with a comfortable sock.  Because the cuff is fabric - the sock just needs to rise above the shoe but longer socks are acceptable.     To don the brace (put it on)  Recommend leaving the AFO inserted in the shoe covered with an insole (the insole which came with the shoe or a store bought insole is fine.    Loosen the laces or Velcro  on the shoe, Free the velcro straps so that they dangle loosely.  Shoes which do not open such as loafers or slip-ons are not reccommended for  use with an AFO.  Position the AFO cuff onto your calf.   Slide your foot into the shoe.    Fasten the Velcro or laces on your shoe.    Secure the AFO cuff with the Velcro strap  Pull pant leg over AFO  To remove brace:  Remove Velcro fasteners  Loosen the shoe laces or velcro  Pull gently off foot from bottom  Store brace in clean, dry location with the Velcro fastened to avoid Velcro getting caught on other clothes or accumulating debris.  The brace should always be used with well-fitted sneakers or other footwear.  Always use an insole over the AFO    Wearing your device  The AFO is an active brace - meaning that it is meant to be worn during physical activity.  Your physician may have specific instructions for you.  You should break-in your AFO gradually:   We recommend the following schedule:     Day 1: Wear for 1 hour, off for 1 hour.  Repeat as time allows    Day 2:  Wear for 2 hours, off for 1 hour.  Repeat as time allows    Day 3: Wear for 3 hours, off for 1 hour.  Repeat as time allows    Day 4: Wear for 4 hours, off for 1 hour.  Repeat as time allows    Day 5 and beyond:   Wear full day  Your AFO is meant to help prevent trips and falls.  You should develop a routine to wearing your AFO so that you are not caught without it.  The AFO is not meant to be worn to bed.      Cleaning / Care / Infection Control  The AFO can be washed in with soap and water.  Use a mild cleaning detergent.  Do not use bleach.  Do not place in washer or dryer - set out to air dry.    The brace is meant to be used on the person for whom it was prescribed.  It should not be shared with other people or on other body parts.      Risks  The most notable risk to you of wearing this brace is skin irritation.  There is a risk of over-use causing skin irritation or blistering.  Be sure to wear a comfortable sock.  Like with new shoes, it may be necessary to break-in the brace by wearing it for short periods of time and checking your skin for any signs of irritation.    While some redness is expected when wearing a brace tightly, it should go away within 20-30 minutes.  If you experience any blisters or signs of rash discontinue wearing and contact your provider.    Does not contain latex    How to contact  If you experience any discomfort or have questions about the use of this orthosis you may contact the DMEPOS team by using the inbasket feature of your mychart account.  You may also contact your provider in the same way.  If you would like to reach out by phone, the number will print with these in

## 2022-07-17 NOTE — Unmapped (Signed)
Patient is pleasant and calm.No complaints this shift.Callbell within reach.  Problem: Rehabilitation (IRF) Plan of Care  Goal: Plan of Care Review  Outcome: Progressing  Goal: Patient-Specific Goal (Individualized)  Outcome: Progressing  Goal: Absence of New-Onset Illness or Injury  Outcome: Progressing  Intervention: Prevent Fall and Fall Injury  Recent Flowsheet Documentation  Taken 07/17/2022 0200 by Barbaraann Cao, RN  Safety Interventions: fall reduction program maintained  Taken 07/17/2022 0000 by Barbaraann Cao, RN  Safety Interventions: fall reduction program maintained  Taken 07/16/2022 2200 by Barbaraann Cao, RN  Safety Interventions: fall reduction program maintained  Taken 07/16/2022 2000 by Barbaraann Cao, RN  Safety Interventions: fall reduction program maintained  Goal: Optimal Comfort and Wellbeing  Outcome: Progressing  Goal: Home and Community Transition Plan Established  Outcome: Progressing  Goal: Rounds/Family Conference  Outcome: Progressing

## 2022-07-17 NOTE — Unmapped (Signed)
Warfarin Therapeutic Monitoring Pharmacy Note    Warren [name] is a 47 y.o. male continuing warfarin.     Indication: bioprosthetic aortic valve replacement    Prior Dosing Information: Current regimen 4 mg po daily       Source(s) of information used to determine prior to admission dosing: Fill HIstory    Goals:  Therapeutic Drug Levels  INR range: 1.5-2    Additional Clinical Monitoring/Outcomes  Monitor hemoglobin and platelets  Monitor for signs and symptoms of bleeding  Monitor liver function (LFTs, bilirubin)    Results:  Lab Results   Component Value Date    INR 2.15 07/17/2022    INR 2.86 07/16/2022    INR 2.62 07/14/2022       Pharmacokinetic Considerations and Significant Drug Interactions:   Drug Interactions  not applicable    Bridge Therapy  None required    Concurrent Antiplatelet Medications  aspirin    Assessment/Plan:  Recommendation(s)  INR is supratherapeutic.  Continue 4mg  daily and allow it to achieve steady state.    Follow-up  Next INR to be obtained: daily with AM labs    A pharmacist will continue to monitor and recommend INRs/dose changes as appropriate    Please page service pharmacist with questions/clarifications.    Kathlen Brunswick, PharmD

## 2022-07-18 LAB — PROTIME-INR
INR: 1.66
PROTIME: 18.2 s — ABNORMAL HIGH (ref 9.9–12.6)

## 2022-07-18 MED ORDER — NICOTINE 14 MG/24 HR DAILY TRANSDERMAL PATCH
MEDICATED_PATCH | Freq: Every day | TRANSDERMAL | 0 refills | 28 days | Status: CN
Start: 2022-07-18 — End: ?

## 2022-07-18 MED ORDER — ATORVASTATIN 80 MG TABLET
ORAL_TABLET | Freq: Every day | ORAL | 0 refills | 30 days | Status: CP
Start: 2022-07-18 — End: 2022-08-17
  Filled 2022-07-20: qty 30, 30d supply, fill #0

## 2022-07-18 MED ORDER — ASPIRIN 81 MG CHEWABLE TABLET
ORAL_TABLET | Freq: Every day | ORAL | 0 refills | 30 days | Status: CP
Start: 2022-07-18 — End: 2022-08-17
  Filled 2022-07-20: qty 30, 30d supply, fill #0

## 2022-07-18 MED ORDER — RISPERIDONE 1 MG TABLET
ORAL_TABLET | Freq: Every evening | ORAL | 0 refills | 30 days | Status: CP
Start: 2022-07-18 — End: 2022-08-17
  Filled 2022-07-20: qty 30, 30d supply, fill #0

## 2022-07-18 MED ORDER — WARFARIN 4 MG TABLET
ORAL_TABLET | Freq: Every day | ORAL | 0 refills | 30 days | Status: CP
Start: 2022-07-18 — End: 2022-08-17
  Filled 2022-07-20: qty 30, 30d supply, fill #0

## 2022-07-18 MED ORDER — METOPROLOL SUCCINATE ER 50 MG TABLET,EXTENDED RELEASE 24 HR
ORAL_TABLET | Freq: Every day | ORAL | 0 refills | 30 days | Status: CP
Start: 2022-07-18 — End: 2022-08-17
  Filled 2022-07-20: qty 30, 30d supply, fill #0

## 2022-07-18 MED ADMIN — aspirin chewable tablet 81 mg: 81 mg | ORAL | @ 13:00:00

## 2022-07-18 MED ADMIN — nicotine (NICODERM CQ) 14 mg/24 hr patch 1 patch: 1 | TRANSDERMAL | @ 13:00:00

## 2022-07-18 MED ADMIN — bictegrav-emtricit-tenofov ala (BIKTARVY) 50-200-25 mg tablet 1 tablet: 1 | ORAL | @ 13:00:00

## 2022-07-18 MED ADMIN — atorvastatin (LIPITOR) tablet 80 mg: 80 mg | ORAL | @ 13:00:00

## 2022-07-18 MED ADMIN — warfarin (JANTOVEN) tablet 4 mg: 4 mg | ORAL | @ 22:00:00

## 2022-07-18 MED ADMIN — risperiDONE (RisperDAL) tablet 1 mg: 1 mg | ORAL
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
## 2022-07-18 NOTE — Unmapped (Signed)
Patient is A&O. Regular diet, set up with meals. Continent of bowel and bladder. No new signs of skin breakdown. No complaints of pain. Skin and safety protocol maintained. Bed in lowest position, call bell within reach, wheels locked. Will continue to monitor.     Problem: Rehabilitation (IRF) Plan of Care  Goal: Plan of Care Review  Outcome: Progressing  Goal: Patient-Specific Goal (Individualized)  Outcome: Progressing  Goal: Absence of New-Onset Illness or Injury  Outcome: Progressing  Intervention: Prevent Fall and Fall Injury  Recent Flowsheet Documentation  Taken 07/18/2022 1600 by Earlie Counts, RN  Safety Interventions: fall reduction program maintained  Taken 07/18/2022 1400 by Earlie Counts, RN  Safety Interventions: fall reduction program maintained  Taken 07/18/2022 1200 by Earlie Counts, RN  Safety Interventions: fall reduction program maintained  Taken 07/18/2022 1000 by Earlie Counts, RN  Safety Interventions: fall reduction program maintained  Taken 07/18/2022 0800 by Earlie Counts, RN  Safety Interventions: fall reduction program maintained  Goal: Optimal Comfort and Wellbeing  Outcome: Progressing  Goal: Home and Community Transition Plan Established  Outcome: Progressing  Goal: Rounds/Family Conference  Outcome: Progressing

## 2022-07-18 NOTE — Unmapped (Signed)
Alert and oriented x4.No acute changes this shift.Callbell within reach.  Problem: Rehabilitation (IRF) Plan of Care  Goal: Plan of Care Review  Outcome: Progressing  Goal: Patient-Specific Goal (Individualized)  Outcome: Progressing  Goal: Absence of New-Onset Illness or Injury  Outcome: Progressing  Intervention: Prevent Fall and Fall Injury  Recent Flowsheet Documentation  Taken 07/18/2022 0200 by Barbaraann Cao, RN  Safety Interventions: fall reduction program maintained  Taken 07/18/2022 0000 by Barbaraann Cao, RN  Safety Interventions: fall reduction program maintained  Taken 07/17/2022 2200 by Barbaraann Cao, RN  Safety Interventions: fall reduction program maintained  Taken 07/17/2022 1950 by Barbaraann Cao, RN  Safety Interventions: fall reduction program maintained  Goal: Optimal Comfort and Wellbeing  Outcome: Progressing  Goal: Home and Community Transition Plan Established  Outcome: Progressing  Goal: Rounds/Family Conference  Outcome: Progressing

## 2022-07-18 NOTE — Unmapped (Signed)
Patient is alert and oriented x4. Continent of bladder and bowel. Pain is well controlled on current regimen. No new skin issues noted. Safety and fall precautions in place. Call bell and belongings within reach. No falls or injuries, this shift. No changes to the plan of care at this time.   Problem: Rehabilitation (IRF) Plan of Care  Goal: Plan of Care Review  Outcome: Progressing  Goal: Patient-Specific Goal (Individualized)  Outcome: Progressing  Goal: Absence of New-Onset Illness or Injury  Outcome: Progressing  Intervention: Prevent Fall and Fall Injury  Recent Flowsheet Documentation  Taken 07/17/2022 1600 by Wyvonna Plum, RN  Safety Interventions: fall reduction program maintained  Taken 07/17/2022 1400 by Wyvonna Plum, RN  Safety Interventions: fall reduction program maintained  Taken 07/17/2022 1230 by Wyvonna Plum, RN  Safety Interventions: fall reduction program maintained  Taken 07/17/2022 0830 by Wyvonna Plum, RN  Safety Interventions: fall reduction program maintained  Goal: Optimal Comfort and Wellbeing  Outcome: Progressing  Goal: Home and Community Transition Plan Established  Outcome: Progressing  Goal: Rounds/Family Conference  Outcome: Progressing     Problem: Self-Care Deficit  Goal: Improved Ability to Complete Activities of Daily Living  Outcome: Progressing     Problem: Fall Injury Risk  Goal: Absence of Fall and Fall-Related Injury  Outcome: Progressing  Intervention: Promote Injury-Free Environment  Recent Flowsheet Documentation  Taken 07/17/2022 1600 by Wyvonna Plum, RN  Safety Interventions: fall reduction program maintained  Taken 07/17/2022 1400 by Wyvonna Plum, RN  Safety Interventions: fall reduction program maintained  Taken 07/17/2022 1230 by Wyvonna Plum, RN  Safety Interventions: fall reduction program maintained  Taken 07/17/2022 0830 by Wyvonna Plum, RN  Safety Interventions: fall reduction program maintained     Problem: Skin Injury Risk Increased  Goal: Skin Health and Integrity  Outcome: Progressing  Intervention: Optimize Skin Protection  Recent Flowsheet Documentation  Taken 07/17/2022 1600 by Wyvonna Plum, RN  Pressure Reduction Techniques: frequent weight shift encouraged  Taken 07/17/2022 1400 by Wyvonna Plum, RN  Pressure Reduction Techniques: frequent weight shift encouraged  Taken 07/17/2022 1230 by Wyvonna Plum, RN  Pressure Reduction Techniques: frequent weight shift encouraged  Taken 07/17/2022 0830 by Wyvonna Plum, RN  Pressure Reduction Techniques: frequent weight shift encouraged     Problem: Mobility Impairment  Goal: Optimal Mobility  Outcome: Progressing     Problem: Stroke Rehabilitation  Goal: Optimal Adjustment to Stroke  Outcome: Progressing  Goal: Optimal Safe BADL Performance  Outcome: Progressing  Goal: Effective Bowel Elimination/Continence  Outcome: Progressing  Goal: Optimal Cognitive Function  Outcome: Progressing  Goal: Effective Communication Skills  Outcome: Progressing  Goal: Optimal Safe IADL Performance  Outcome: Progressing  Goal: Optimal Mobility Independence and Safety  Outcome: Progressing  Goal: Optimal Movement and Motor Control  Outcome: Progressing  Goal: Optimal Nutrition Intake  Outcome: Progressing  Goal: Optimal Sensory Perceptual Status  Outcome: Progressing  Goal: Maintains Intimacy/Sexual Expression  Outcome: Progressing  Goal: Effective Spasticity Management  Outcome: Progressing  Goal: Safe and Effective Swallow  Outcome: Progressing  Goal: Effective Urinary Elimination/Continence  Outcome: Progressing

## 2022-07-19 LAB — BASIC METABOLIC PANEL
ANION GAP: 6 mmol/L (ref 5–14)
BLOOD UREA NITROGEN: 10 mg/dL (ref 9–23)
BUN / CREAT RATIO: 11
CALCIUM: 9.3 mg/dL (ref 8.7–10.4)
CHLORIDE: 108 mmol/L — ABNORMAL HIGH (ref 98–107)
CO2: 25.6 mmol/L (ref 20.0–31.0)
CREATININE: 0.93 mg/dL
EGFR CKD-EPI (2021) MALE: >90 mL/min/{1.73_m2} (ref >=60)
GLUCOSE RANDOM: 85 mg/dL (ref 70–179)
POTASSIUM: 4.4 mmol/L (ref 3.4–4.8)
SODIUM: 140 mmol/L (ref 135–145)

## 2022-07-19 LAB — CBC
HEMATOCRIT: 42.4 % (ref 39.0–48.0)
HEMOGLOBIN: 14.2 g/dL (ref 12.9–16.5)
MEAN CORPUSCULAR HEMOGLOBIN CONC: 33.4 g/dL (ref 32.0–36.0)
MEAN CORPUSCULAR HEMOGLOBIN: 29.8 pg (ref 25.9–32.4)
MEAN CORPUSCULAR VOLUME: 89.1 fL (ref 77.6–95.7)
MEAN PLATELET VOLUME: 7.2 fL (ref 6.8–10.7)
PLATELET COUNT: 348 10*9/L (ref 150–450)
RED BLOOD CELL COUNT: 4.76 10*12/L (ref 4.26–5.60)
RED CELL DISTRIBUTION WIDTH: 12.8 % (ref 12.2–15.2)
WBC ADJUSTED: 5.7 10*9/L (ref 3.6–11.2)

## 2022-07-19 LAB — MAGNESIUM: MAGNESIUM: 1.7 mg/dL (ref 1.6–2.6)

## 2022-07-19 LAB — PROTIME-INR
INR: 1.83
PROTIME: 20 s — ABNORMAL HIGH (ref 9.9–12.6)

## 2022-07-19 LAB — PHOSPHORUS: PHOSPHORUS: 4.1 mg/dL (ref 2.4–5.1)

## 2022-07-19 MED ADMIN — aspirin chewable tablet 81 mg: 81 mg | ORAL | @ 12:00:00

## 2022-07-19 MED ADMIN — risperiDONE (RisperDAL) tablet 1 mg: 1 mg | ORAL

## 2022-07-19 MED ADMIN — warfarin (JANTOVEN) tablet 4 mg: 4 mg | ORAL | @ 23:00:00

## 2022-07-19 MED ADMIN — nicotine (NICODERM CQ) 14 mg/24 hr patch 1 patch: 1 | TRANSDERMAL | @ 12:00:00

## 2022-07-19 MED ADMIN — metoPROLOL succinate (Toprol-XL) 24 hr tablet 50 mg: 50 mg | ORAL | @ 12:00:00

## 2022-07-19 MED ADMIN — bictegrav-emtricit-tenofov ala (BIKTARVY) 50-200-25 mg tablet 1 tablet: 1 | ORAL | @ 12:00:00

## 2022-07-19 MED ADMIN — atorvastatin (LIPITOR) tablet 80 mg: 80 mg | ORAL | @ 12:00:00

## 2022-07-19 NOTE — Unmapped (Signed)
Warfarin Therapeutic Monitoring Pharmacy Note    Warren [name] is a 47 y.o. male continuing warfarin.     Indication: bioprosthetic aortic valve replacement    Prior Dosing Information: Current regimen 4 mg po daily       Source(s) of information used to determine prior to admission dosing: Fill HIstory    Goals:  Therapeutic Drug Levels  INR range: 1.5-2    Additional Clinical Monitoring/Outcomes  Monitor hemoglobin and platelets  Monitor for signs and symptoms of bleeding  Monitor liver function (LFTs, bilirubin)    Results:  Lab Results   Component Value Date    INR 1.83 07/19/2022    INR 1.66 07/18/2022    INR 2.15 07/17/2022       Pharmacokinetic Considerations and Significant Drug Interactions:   Drug Interactions  not applicable    Bridge Therapy  None required    Concurrent Antiplatelet Medications  aspirin    Assessment/Plan:  Recommendation(s)  INR is therapeutic.  Continue 4mg  daily     Follow-up  Next INR to be obtained: daily with AM labs    A pharmacist will continue to monitor and recommend INRs/dose changes as appropriate    Please page service pharmacist with questions/clarifications.    Kathlen Brunswick, PharmD
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
## 2022-07-19 NOTE — Unmapped (Signed)
Received pt lying comfortably in bed, awake, alert and oriented x 4. No distress noted. Cont both bowel and bladder. Due meds given. Skin injury prevention, fall, aspiration and delirium precaution observed. Needs attended. Made pt comfortable. Plan of care continued.     Problem: Rehabilitation (IRF) Plan of Care  Goal: Plan of Care Review  Outcome: Progressing  Flowsheets (Taken 07/19/2022 1220)  Progress: improving  Plan of Care Reviewed With: patient  Goal: Patient-Specific Goal (Individualized)  Outcome: Progressing  Goal: Absence of New-Onset Illness or Injury  Outcome: Progressing  Intervention: Prevent Fall and Fall Injury  Recent Flowsheet Documentation  Taken 07/19/2022 1215 by Hale Drone, RN  Safety Interventions:   fall reduction program maintained   low bed  Taken 07/19/2022 1000 by Hale Drone, RN  Safety Interventions: fall reduction program maintained  Taken 07/19/2022 0800 by Hale Drone, RN  Safety Interventions:   fall reduction program maintained   low bed  Intervention: Prevent Skin Injury  Recent Flowsheet Documentation  Taken 07/19/2022 1215 by Hale Drone, RN  Device Skin Pressure Protection: absorbent pad utilized/changed  Skin Protection: incontinence pads utilized  Taken 07/19/2022 1000 by Hale Drone, RN  Device Skin Pressure Protection: absorbent pad utilized/changed  Skin Protection: incontinence pads utilized  Taken 07/19/2022 0800 by Hale Drone, RN  Device Skin Pressure Protection: absorbent pad utilized/changed  Skin Protection: incontinence pads utilized  Intervention: Prevent Infection  Recent Flowsheet Documentation  Taken 07/19/2022 1215 by Hale Drone, RN  Infection Prevention:   hand hygiene promoted   rest/sleep promoted   single patient room provided  Taken 07/19/2022 1000 by Hale Drone, RN  Infection Prevention:   hand hygiene promoted   rest/sleep promoted   single patient room provided  Taken 07/19/2022 0800 by Hale Drone, RN  Infection Prevention:   hand hygiene promoted   rest/sleep promoted   single patient room provided  Intervention: Prevent VTE (Venous Thromboembolism)  Recent Flowsheet Documentation  Taken 07/19/2022 0845 by Hale Drone, RN  VTE Prevention/Management: anticoagulant therapy  Goal: Optimal Comfort and Wellbeing  Outcome: Progressing  Goal: Home and Community Transition Plan Established  Outcome: Progressing  Goal: Rounds/Family Conference  Outcome: Progressing     Problem: Self-Care Deficit  Goal: Improved Ability to Complete Activities of Daily Living  Outcome: Progressing     Problem: Fall Injury Risk  Goal: Absence of Fall and Fall-Related Injury  Outcome: Progressing  Intervention: Promote Injury-Free Environment  Recent Flowsheet Documentation  Taken 07/19/2022 1215 by Hale Drone, RN  Safety Interventions:   fall reduction program maintained   low bed  Taken 07/19/2022 1000 by Hale Drone, RN  Safety Interventions: fall reduction program maintained  Taken 07/19/2022 0800 by Hale Drone, RN  Safety Interventions:   fall reduction program maintained   low bed     Problem: Skin Injury Risk Increased  Goal: Skin Health and Integrity  Outcome: Progressing  Intervention: Optimize Skin Protection  Recent Flowsheet Documentation  Taken 07/19/2022 1215 by Hale Drone, RN  Pressure Reduction Techniques:   frequent weight shift encouraged   heels elevated off bed  Pressure Reduction Devices: pressure-redistributing mattress utilized  Skin Protection: incontinence pads utilized  Taken 07/19/2022 1000 by Hale Drone, RN  Pressure Reduction Techniques:   frequent weight shift encouraged   heels elevated off bed  Pressure Reduction Devices: pressure-redistributing mattress utilized  Skin Protection: incontinence pads utilized  Taken 07/19/2022 0800 by Tamala Ser,  Ezequiel Essex, RN  Pressure Reduction Techniques: frequent weight shift encouraged  Pressure Reduction Devices: pressure-redistributing mattress utilized  Skin Protection: incontinence pads utilized     Problem: Mobility Impairment  Goal: Optimal Mobility  Outcome: Progressing  Intervention: Optimize Mobility  Recent Flowsheet Documentation  Taken 07/19/2022 1215 by Hale Drone, RN  Positioning/Transfer Devices:   pillows   in use  Taken 07/19/2022 1000 by Hale Drone, RN  Positioning/Transfer Devices:   pillows   in use  Taken 07/19/2022 0800 by Hale Drone, RN  Positioning/Transfer Devices:   pillows   in use     Problem: Stroke Rehabilitation  Goal: Optimal Adjustment to Stroke  Outcome: Progressing  Goal: Optimal Safe BADL Performance  Outcome: Progressing  Goal: Effective Bowel Elimination/Continence  Outcome: Progressing  Goal: Optimal Cognitive Function  Outcome: Progressing  Goal: Effective Communication Skills  Outcome: Progressing  Goal: Optimal Safe IADL Performance  Outcome: Progressing  Goal: Optimal Mobility Independence and Safety  Outcome: Progressing  Goal: Optimal Movement and Motor Control  Outcome: Progressing  Goal: Optimal Nutrition Intake  Outcome: Progressing  Goal: Optimal Sensory Perceptual Status  Outcome: Progressing  Goal: Maintains Intimacy/Sexual Expression  Outcome: Progressing  Goal: Effective Spasticity Management  Outcome: Progressing  Goal: Safe and Effective Swallow  Outcome: Progressing  Intervention: Optimize Eating and Swallowing  Recent Flowsheet Documentation  Taken 07/19/2022 1215 by Hale Drone, RN  Aspiration Precautions:   awake/alert before oral intake   upright posture maintained  Taken 07/19/2022 1000 by Hale Drone, RN  Aspiration Precautions: awake/alert before oral intake  Taken 07/19/2022 0800 by Hale Drone, RN  Aspiration Precautions:   awake/alert before oral intake   upright posture maintained  Goal: Effective Urinary Elimination/Continence  Outcome: Progressing

## 2022-07-19 NOTE — Unmapped (Signed)
Pt is A&o x4. R sided weakness. Denies pain or discomfort. Continent of bowel and bladder. Aspiration, fall and safety precautions maintained. Bed is in lowest position. Call bell within reach.    Problem: Rehabilitation (IRF) Plan of Care  Goal: Plan of Care Review  Outcome: Progressing  Goal: Patient-Specific Goal (Individualized)  Outcome: Progressing  Goal: Absence of New-Onset Illness or Injury  Outcome: Progressing  Intervention: Prevent Fall and Fall Injury  Recent Flowsheet Documentation  Taken 07/19/2022 0200 by Sherril Cong, RN  Safety Interventions: fall reduction program maintained  Taken 07/19/2022 0000 by Sherril Cong, RN  Safety Interventions: fall reduction program maintained  Taken 07/18/2022 2200 by Sherril Cong, RN  Safety Interventions: fall reduction program maintained  Taken 07/18/2022 2000 by Sherril Cong, RN  Safety Interventions: fall reduction program maintained  Goal: Optimal Comfort and Wellbeing  Outcome: Progressing  Goal: Home and Community Transition Plan Established  Outcome: Progressing  Goal: Rounds/Family Conference  Outcome: Progressing     Problem: Self-Care Deficit  Goal: Improved Ability to Complete Activities of Daily Living  Outcome: Progressing     Problem: Fall Injury Risk  Goal: Absence of Fall and Fall-Related Injury  Outcome: Progressing  Intervention: Promote Injury-Free Environment  Recent Flowsheet Documentation  Taken 07/19/2022 0200 by Sherril Cong, RN  Safety Interventions: fall reduction program maintained  Taken 07/19/2022 0000 by Sherril Cong, RN  Safety Interventions: fall reduction program maintained  Taken 07/18/2022 2200 by Sherril Cong, RN  Safety Interventions: fall reduction program maintained  Taken 07/18/2022 2000 by Sherril Cong, RN  Safety Interventions: fall reduction program maintained     Problem: Skin Injury Risk Increased  Goal: Skin Health and Integrity  Outcome: Progressing  Intervention: Optimize Skin Protection  Recent Flowsheet Documentation  Taken 07/18/2022 2000 by Sherril Cong, RN  Pressure Reduction Techniques: frequent weight shift encouraged     Problem: Mobility Impairment  Goal: Optimal Mobility  Outcome: Progressing     Problem: Stroke Rehabilitation  Goal: Optimal Adjustment to Stroke  Outcome: Progressing  Goal: Optimal Safe BADL Performance  Outcome: Progressing  Goal: Effective Bowel Elimination/Continence  Outcome: Progressing  Goal: Optimal Cognitive Function  Outcome: Progressing  Goal: Effective Communication Skills  Outcome: Progressing  Goal: Optimal Safe IADL Performance  Outcome: Progressing  Goal: Optimal Mobility Independence and Safety  Outcome: Progressing  Goal: Optimal Movement and Motor Control  Outcome: Progressing  Goal: Optimal Nutrition Intake  Outcome: Progressing  Goal: Optimal Sensory Perceptual Status  Outcome: Progressing  Goal: Maintains Intimacy/Sexual Expression  Outcome: Progressing  Goal: Effective Spasticity Management  Outcome: Progressing  Goal: Safe and Effective Swallow  Outcome: Progressing  Goal: Effective Urinary Elimination/Continence  Outcome: Progressing

## 2022-07-20 LAB — PROTIME-INR
INR: 1.72
PROTIME: 18.8 s — ABNORMAL HIGH (ref 9.9–12.6)

## 2022-07-20 MED ADMIN — aspirin chewable tablet 81 mg: 81 mg | ORAL | @ 13:00:00 | Stop: 2022-07-20

## 2022-07-20 MED ADMIN — nicotine (NICODERM CQ) 14 mg/24 hr patch 1 patch: 1 | TRANSDERMAL | @ 13:00:00 | Stop: 2022-07-20

## 2022-07-20 MED ADMIN — risperiDONE (RisperDAL) tablet 1 mg: 1 mg | ORAL | @ 01:00:00

## 2022-07-20 MED ADMIN — atorvastatin (LIPITOR) tablet 80 mg: 80 mg | ORAL | @ 13:00:00 | Stop: 2022-07-20

## 2022-07-20 MED ADMIN — bictegrav-emtricit-tenofov ala (BIKTARVY) 50-200-25 mg tablet 1 tablet: 1 | ORAL | @ 13:00:00 | Stop: 2022-07-20

## 2022-07-20 NOTE — Unmapped (Signed)
Pt is A&o x4. R sided weakness. Denies pain or discomfort. Continent of bowel and bladder. Aspiration, fall and safety precautions maintained. Bed is in lowest position. Call bell within reach.     Problem: Rehabilitation (IRF) Plan of Care  Goal: Plan of Care Review  Outcome: Progressing  Goal: Patient-Specific Goal (Individualized)  Outcome: Progressing  Goal: Absence of New-Onset Illness or Injury  Outcome: Progressing  Intervention: Prevent Fall and Fall Injury  Recent Flowsheet Documentation  Taken 07/20/2022 0200 by Sherril Cong, RN  Safety Interventions: fall reduction program maintained  Taken 07/20/2022 0000 by Sherril Cong, RN  Safety Interventions: fall reduction program maintained  Taken 07/19/2022 2200 by Sherril Cong, RN  Safety Interventions: fall reduction program maintained  Taken 07/19/2022 2000 by Sherril Cong, RN  Safety Interventions: fall reduction program maintained  Goal: Optimal Comfort and Wellbeing  Outcome: Progressing  Goal: Home and Community Transition Plan Established  Outcome: Progressing  Goal: Rounds/Family Conference  Outcome: Progressing     Problem: Self-Care Deficit  Goal: Improved Ability to Complete Activities of Daily Living  Outcome: Progressing     Problem: Fall Injury Risk  Goal: Absence of Fall and Fall-Related Injury  Outcome: Progressing  Intervention: Promote Injury-Free Environment  Recent Flowsheet Documentation  Taken 07/20/2022 0200 by Sherril Cong, RN  Safety Interventions: fall reduction program maintained  Taken 07/20/2022 0000 by Sherril Cong, RN  Safety Interventions: fall reduction program maintained  Taken 07/19/2022 2200 by Sherril Cong, RN  Safety Interventions: fall reduction program maintained  Taken 07/19/2022 2000 by Sherril Cong, RN  Safety Interventions: fall reduction program maintained     Problem: Skin Injury Risk Increased  Goal: Skin Health and Integrity  Outcome: Progressing  Intervention: Optimize Skin Protection  Recent Flowsheet Documentation  Taken 07/19/2022 2200 by Sherril Cong, RN  Pressure Reduction Techniques: frequent weight shift encouraged  Taken 07/19/2022 2000 by Sherril Cong, RN  Pressure Reduction Techniques: frequent weight shift encouraged     Problem: Mobility Impairment  Goal: Optimal Mobility  Outcome: Progressing     Problem: Stroke Rehabilitation  Goal: Optimal Adjustment to Stroke  Outcome: Progressing  Goal: Optimal Safe BADL Performance  Outcome: Progressing  Goal: Effective Bowel Elimination/Continence  Outcome: Progressing  Goal: Optimal Cognitive Function  Outcome: Progressing  Goal: Effective Communication Skills  Outcome: Progressing  Goal: Optimal Safe IADL Performance  Outcome: Progressing  Goal: Optimal Mobility Independence and Safety  Outcome: Progressing  Goal: Optimal Movement and Motor Control  Outcome: Progressing  Goal: Optimal Nutrition Intake  Outcome: Progressing  Goal: Optimal Sensory Perceptual Status  Outcome: Progressing  Goal: Maintains Intimacy/Sexual Expression  Outcome: Progressing  Goal: Effective Spasticity Management  Outcome: Progressing  Goal: Safe and Effective Swallow  Outcome: Progressing  Goal: Effective Urinary Elimination/Continence  Outcome: Progressing

## 2022-07-20 NOTE — Unmapped (Signed)
Pt discharged home. Friend picked him up. AVS reviewed with patient. He received medications from outpatient pharmacy.   Problem: Rehabilitation (IRF) Plan of Care  Goal: Absence of New-Onset Illness or Injury  Intervention: Prevent Fall and Fall Injury  Recent Flowsheet Documentation  Taken 07/20/2022 1000 by Sherren Mocha, RN  Safety Interventions: fall reduction program maintained  Taken 07/20/2022 0800 by Sherren Mocha, RN  Safety Interventions: fall reduction program maintained     Problem: Fall Injury Risk  Goal: Absence of Fall and Fall-Related Injury  Intervention: Promote Injury-Free Environment  Recent Flowsheet Documentation  Taken 07/20/2022 1000 by Sherren Mocha, RN  Safety Interventions: fall reduction program maintained  Taken 07/20/2022 0800 by Sherren Mocha, RN  Safety Interventions: fall reduction program maintained     Problem: Skin Injury Risk Increased  Goal: Skin Health and Integrity  Intervention: Optimize Skin Protection  Recent Flowsheet Documentation  Taken 07/20/2022 1000 by Sherren Mocha, RN  Pressure Reduction Techniques: frequent weight shift encouraged  Taken 07/20/2022 0800 by Sherren Mocha, RN  Pressure Reduction Techniques: frequent weight shift encouraged  Pressure Reduction Devices: pressure-redistributing mattress utilized     Problem: Mobility Impairment  Goal: Optimal Mobility  Intervention: Optimize Mobility  Recent Flowsheet Documentation  Taken 07/20/2022 0800 by Sherren Mocha, RN  Positioning/Transfer Devices: pillows

## 2022-07-23 DIAGNOSIS — Z09 Encounter for follow-up examination after completed treatment for conditions other than malignant neoplasm: Principal | ICD-10-CM

## 2022-07-25 ENCOUNTER — Ambulatory Visit
Admit: 2022-07-25 | Payer: MEDICARE | Attending: Rehabilitative and Restorative Service Providers" | Primary: Rehabilitative and Restorative Service Providers"

## 2022-07-25 ENCOUNTER — Ambulatory Visit: Admit: 2022-07-25 | Payer: MEDICARE

## 2022-07-25 NOTE — Unmapped (Signed)
[facility]  [address]  [address]  [place], [place] [address]  Phone: [phone]     Sheila Pavlicek. did not show for their scheduled Physical Therapy Evaluation on 07/25/2022 at 9:00 AM. Patient was contacted by scheduler to rescheduled missed evaluation.     This is patient's 1st evaluation no call no show. Patient was contacted in an attempt to reschedule the appointment. .                   SignedAneta Mins, PT  07/25/2022 9:21 AM

## 2022-07-25 NOTE — Unmapped (Signed)
[facility] OCCUPATIONAL THERAPY [place]  OUTPATIENT OCCUPATIONAL THERAPY  07/25/2022    Patient Name: Warren [name].  Date of Birth:05-03-1975           [facility]  [address]  [address]  [place], [place] [address]  Phone: [phone]     Warren [name]. did not show for their scheduled Occupational Therapy Evaluation on 07/25/22 at 1030.     This is patient's 1st evaluation no call no show. Patient was contacted in an attempt to reschedule the appointment. .                   Signed: Mervin Hack, OT  07/25/2022 10:59 AM     I attest that I have reviewed the above information.  Signed: Mervin Hack, OT  07/25/2022 10:59 AM

## 2022-07-30 ENCOUNTER — Ambulatory Visit: Admit: 2022-07-30 | Payer: MEDICARE | Attending: Family | Primary: Family

## 2022-07-30 NOTE — Unmapped (Signed)
RD called pt to follow-up on grocery bag given on 10/16/2021. RD administered the following questions pertaining to the grocery bag contents:    Which food in your bag did you like the best? The least?  Black beans  Vegetables  Peanut butter  Applesauce  Assorted crackers  Instant brown rice  Instant oatmeal  tuna  D. Applesauce e. Assorted crackers    2. Were you able to make a healthy meal with the foods provided? (Yes/No)   Yes    3. What types of foods do you need help with the most? (Meat, vegetables, fruits, grains-choose from the 4 listed)  Meats    4. How long did the food help you for? (Answer only numerically)  16 days    5. Since receiving the bag, have you been to a food pantry? (Yes/No)  Yes    6. Have you used food stamps? (Yes/No)  No    If not, have you applied for food stamps (SNAP benefits)? (Yes/No)  N/a      Food Insecurity:    I'm going to read you two statements that people have made about their food situation. For each statement, please tell me whether the statement was often true, sometimes true, or never true for your household in the last month.      1. ???We worried whether our food would run out before we got money to buy more.???   Never True     2. ???The food that we bought just didn't last, and we didn't have money to get more.  Never True      SNAP benefits: no  SNAP $ per month: n/a  Food pantry: yes    Interventions used/offered: RD offered her services if pt's financial status or food supply were to change in the future.  Pt reports he does not currently have SNAP benefits due to not renewing in time. RD educated pt on double bucks program with SNAP and farmers market.    Barriers to Care: n/a    Time of Intervention: 10 minutes    Unk Pinto, MS, RD, LDN
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
## 2022-08-02 ENCOUNTER — Ambulatory Visit
Admit: 2022-08-02 | Discharge: 2022-08-02 | Payer: MEDICARE | Attending: Nurse Practitioner | Primary: Nurse Practitioner

## 2022-08-02 ENCOUNTER — Institutional Professional Consult (permissible substitution): Admit: 2022-08-02 | Discharge: 2022-08-02 | Payer: MEDICARE

## 2022-08-02 ENCOUNTER — Ambulatory Visit
Admit: 2022-08-02 | Discharge: 2022-08-02 | Payer: MEDICARE | Attending: Cardiovascular Disease | Primary: Cardiovascular Disease

## 2022-08-02 DIAGNOSIS — I442 Atrioventricular block, complete: Principal | ICD-10-CM

## 2022-08-02 DIAGNOSIS — Z9889 Other specified postprocedural states: Principal | ICD-10-CM

## 2022-08-02 DIAGNOSIS — E785 Hyperlipidemia, unspecified: Principal | ICD-10-CM

## 2022-08-02 DIAGNOSIS — Z952 Presence of prosthetic heart valve: Principal | ICD-10-CM

## 2022-08-02 DIAGNOSIS — Z45018 Encounter for adjustment and management of other part of cardiac pacemaker: Principal | ICD-10-CM

## 2022-08-02 DIAGNOSIS — I358 Other nonrheumatic aortic valve disorders: Principal | ICD-10-CM

## 2022-08-02 DIAGNOSIS — I351 Nonrheumatic aortic (valve) insufficiency: Principal | ICD-10-CM

## 2022-08-02 DIAGNOSIS — Z4502 Encounter for adjustment and management of automatic implantable cardiac defibrillator: Principal | ICD-10-CM

## 2022-08-02 LAB — PROTIME-INR
INR: 1.04
PROTIME: 11.6 s (ref 9.9–12.6)

## 2022-08-02 MED ORDER — WARFARIN 4 MG TABLET
ORAL_TABLET | Freq: Every day | ORAL | 11 refills | 30 days | Status: CP
Start: 2022-08-02 — End: 2023-08-02

## 2022-08-02 MED ORDER — LISINOPRIL 5 MG TABLET
ORAL_TABLET | Freq: Every day | ORAL | 3 refills | 90 days | Status: CP
Start: 2022-08-02 — End: 2023-08-02

## 2022-08-02 NOTE — Unmapped (Signed)
Spoke with patient regarding disconnected phone app.   Patient refused assistance at this time. Stated that he had just laid down for a nap and didn't want to fix the app at this time.   He also stated he had gotten a new phone.   Advised patient to call either our office or [employer] back for assistance when ready.   [employer] # [phone]

## 2022-08-02 NOTE — Unmapped (Signed)
Called patient with INR results (1.04). Refilled warfarin (he does not have any) and instructed to call his PCP if he ever runs out.
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
## 2022-08-03 NOTE — Unmapped (Signed)
Attempted to contact patient by phone. Left discrete message for patient to call clinic at [phone].  I am calling to discuss:  Getting patient scheduled again for appointment.

## 2022-08-03 NOTE — Unmapped (Signed)
Called patient about subtherapeutic INR, forwarded to me from cardiology  He hadn't received warfarin when he left the hospital  Prescription was refilled today however. Pharmacy gave him 12 tablets and will give rest at a later date  I've messaged admin to schedule follow up for hospital follow up and INR check next week

## 2022-08-03 NOTE — Unmapped (Addendum)
1st attempt unable to contact LVM for patient   Renea Ee         ----- Message from Lala Lund, MD sent at 08/02/2022  5:01 PM EDT -----  Regarding: INR  Hi  Can we please schedule this patient for hospital follow up end of next week and INR check? Thank you

## 2022-08-07 NOTE — Unmapped (Signed)
Well message sent.

## 2022-08-09 DIAGNOSIS — I639 Cerebral infarction, unspecified: Principal | ICD-10-CM

## 2022-08-09 NOTE — Unmapped (Signed)
Patient called and said that he needs new referrals for PT and OT at [facility] in [facility] as soon as possible. I explained you would not be back in office until next week and you would take care of this when you got back. Patient expressed understanding.   Please let me know when you have put them in and I will call patient.     Thank you,  Jerr

## 2022-08-09 NOTE — Unmapped (Signed)
3x & final attempt to schedule hosp f/u & INR check.     Warren [name]  08/09/22

## 2022-08-14 ENCOUNTER — Ambulatory Visit: Admit: 2022-08-14 | Payer: MEDICARE | Attending: Family | Primary: Family

## 2022-08-24 ENCOUNTER — Ambulatory Visit
Admit: 2022-08-24 | Payer: MEDICARE | Attending: Rehabilitative and Restorative Service Providers" | Primary: Rehabilitative and Restorative Service Providers"

## 2022-08-24 ENCOUNTER — Ambulatory Visit: Admit: 2022-08-24 | Payer: MEDICARE

## 2022-08-24 NOTE — Unmapped (Signed)
[facility]  [address]  [address]  [place], [place] [address]  Phone: [phone]     Ilir Mahrt. did not show for their scheduled Physical Therapy Evaluation on 08/24/22 at 1:30pm. Patient was contacted by scheduler to rescheduled missed evaluation.     This is patient's 2nd evaluation no call no show. Based on clinic policy, they must complete a one month wait before being rescheduled..                   SignedBennie Dallas, PT  08/24/2022 2:29 PM

## 2022-08-24 NOTE — Unmapped (Signed)
[facility] OCCUPATIONAL THERAPY [place]  OUTPATIENT OCCUPATIONAL THERAPY  08/24/2022    Patient Name: Warren [name].  Date of Birth:02-12-1975            [facility]  [address]  [address]  [place], [place] [address]  Phone: [phone]     Warren [name]. did not show for their scheduled Occupational Therapy Evaluation on 08/24/22 at 0945.     This is patient's 2nd evaluation no call no show. Based on clinic policy, they must complete a one month wait before being rescheduled..                   Signed: Mervin Hack, OT  08/24/2022 10:03 AM     I attest that I have reviewed the above information.  Signed: Mervin Hack, OT  08/24/2022 10:03 AM
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
## 2022-08-31 NOTE — Unmapped (Signed)
[facility] Nursing Attempt      TYPE OF ENCOUNTER: Text     REASON: Check in    ADHERENCE: Unable to assess at this time     ASSESSMENT: RN received an inbasket message that the patient had been admitted to the hospital. Please see the text encounter below:     RN: Remi Haggard! Whats going on???I just want to let you know that Me and Corazon care about you. We are here for you whenever you are ready    No response from the patient       PLAN: RN will continue to follow up with the patient     DURATION: 1 min

## 2022-09-01 NOTE — Unmapped (Signed)
Home Monitor disconnected- Text sent, No MyChart, letter also sent (has been disconnected for 119 days with no success of reconnection so far).

## 2022-09-03 NOTE — Unmapped (Signed)
[facility] Nursing Note     TYPE OF ENCOUNTER: Text     REASON: Appointment Coordination     ADHERENCE: Not assessed at this time.     ASSESSMENT: Pt. Needs an appointment with both his ID provider, and PCP.  RN was able to set up the patient with an appointment with his PCP, and ID provider on the same day. Pt. Has an appt on 8/22@ 2 pm with the ID clinic, and at 3:40 with the Internal Medicine clinic.     Please see the following text encounter below:    VO:ZDGU Morning Skip, I hope that you had a great weekend.  RN: There were medications that were prescribed after you were discharged from the hospital,  did you ever pick those up?  RN: Remi Haggard, dont kill me. But we had to move your appt to 8/22 @ 2 pm.  I also set up an appt for you to see Dr. Glenetta Hew that day at 3:40pm. This way you can have two appts in one day      INTERVENTION: RN contacted the patient's PCP, and clinic scheduling to coordinate the patients appointment with one another. RN also reached out to the patient's cardiologist to confirm the patient's med list. After leaving the hospital, the patient was prescribed a 30 day supply of Atorvastatin, Aspirin, and Metoprolol. If the patient is to continue taking these medications, RN will request refills and have [facility]. Pt. Does not have his own transportation, and may have limited mobility due to stroke.     PLAN: RN will request that refills be [facility] to [facility], once the patient's medication list is confirmed.     DURATION:  20 min (time includes coordinating with Internal medicine and sending a message to Cardiology)
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
## 2022-09-05 NOTE — Unmapped (Signed)
[facility]  UNREACHABLE    Warren [name]. is a 47 y.o. male identified as possibly having a proportion of days covered of less than 80%  for a hyperlipidemia medication, based on payer reports and chart review.       1st attempt call made to the patient's Cell number(s). There was no answer & voicemail was left to return call.       Population:  [employer]    Medication(s):  Cholesterol: Atorvastatin (Lipitor) 80mg  (High Intensity)   Last Filled on 07/20/2022 for a 30 day supply  Refills remaining:YES      Additional Details & Actions Taken:   Text message sent and Patient is a candidate for 90 -day supply if they and provider are amenable.      Aldona Bar , CPhT  Certified Pharmacy Technician  [facility] ([facility])  Phone: [phone]

## 2022-09-12 NOTE — Unmapped (Signed)
[facility] Nursing Note     TYPE OF ENCOUNTER: Text    REASON: Rescheduled appointment    ADHERENCE: Not assessed at this time    ASSESSMENT: RN contacted the patient to confirm that he received the message that his appt was changed to 8/22 instead of the 20th.    RN:Hey Warren [name], did you get my message about your appointment being on 8/22, instead of the 20th?  CT: Yes I did, and thank you    INTERVENTION: RN reminded the patient that his appt was rescheduled.     PLAN: RN will continue to follow up with the patient.     DURATION: 5 min

## 2022-09-13 ENCOUNTER — Ambulatory Visit: Admit: 2022-09-13 | Discharge: 2022-09-13 | Payer: MEDICARE | Attending: Family | Primary: Family

## 2022-09-13 ENCOUNTER — Ambulatory Visit: Admit: 2022-09-13 | Discharge: 2022-09-13 | Payer: MEDICARE

## 2022-09-13 DIAGNOSIS — Z720 Tobacco use: Principal | ICD-10-CM

## 2022-09-13 DIAGNOSIS — Z9189 Other specified personal risk factors, not elsewhere classified: Principal | ICD-10-CM

## 2022-09-13 DIAGNOSIS — Z79899 Other long term (current) drug therapy: Principal | ICD-10-CM

## 2022-09-13 DIAGNOSIS — B20 Human immunodeficiency virus [HIV] disease: Principal | ICD-10-CM

## 2022-09-13 DIAGNOSIS — Z952 Presence of prosthetic heart valve: Principal | ICD-10-CM

## 2022-09-13 DIAGNOSIS — E785 Hyperlipidemia, unspecified: Principal | ICD-10-CM

## 2022-09-13 DIAGNOSIS — Z5181 Encounter for therapeutic drug level monitoring: Principal | ICD-10-CM

## 2022-09-13 DIAGNOSIS — R531 Weakness: Principal | ICD-10-CM

## 2022-09-13 DIAGNOSIS — Z0184 Encounter for antibody response examination: Principal | ICD-10-CM

## 2022-09-13 DIAGNOSIS — Z1159 Encounter for screening for other viral diseases: Principal | ICD-10-CM

## 2022-09-13 DIAGNOSIS — E559 Vitamin D deficiency, unspecified: Principal | ICD-10-CM

## 2022-09-13 DIAGNOSIS — Z113 Encounter for screening for infections with a predominantly sexual mode of transmission: Principal | ICD-10-CM

## 2022-09-13 DIAGNOSIS — I5022 Chronic systolic (congestive) heart failure: Principal | ICD-10-CM

## 2022-09-13 DIAGNOSIS — Z7289 Other problems related to lifestyle: Principal | ICD-10-CM

## 2022-09-13 DIAGNOSIS — Z23 Encounter for immunization: Principal | ICD-10-CM

## 2022-09-13 DIAGNOSIS — F22 Delusional disorders: Principal | ICD-10-CM

## 2022-09-13 DIAGNOSIS — Z1633 Resistance to antiviral drug(s): Principal | ICD-10-CM

## 2022-09-13 DIAGNOSIS — Z8673 Personal history of transient ischemic attack (TIA), and cerebral infarction without residual deficits: Principal | ICD-10-CM

## 2022-09-13 DIAGNOSIS — B349 Viral infection, unspecified: Principal | ICD-10-CM

## 2022-09-13 LAB — CBC W/ AUTO DIFF
BASOPHILS ABSOLUTE COUNT: 0.1 10*9/L (ref 0.0–0.1)
BASOPHILS RELATIVE PERCENT: 1 %
EOSINOPHILS ABSOLUTE COUNT: 0.1 10*9/L (ref 0.0–0.5)
EOSINOPHILS RELATIVE PERCENT: 1.1 %
HEMATOCRIT: 45.3 % (ref 39.0–48.0)
HEMOGLOBIN: 14.7 g/dL (ref 12.9–16.5)
LYMPHOCYTES ABSOLUTE COUNT: 1.7 10*9/L (ref 1.1–3.6)
LYMPHOCYTES RELATIVE PERCENT: 21 %
MEAN CORPUSCULAR HEMOGLOBIN CONC: 32.5 g/dL (ref 32.0–36.0)
MEAN CORPUSCULAR HEMOGLOBIN: 29.3 pg (ref 25.9–32.4)
MEAN CORPUSCULAR VOLUME: 90.1 fL (ref 77.6–95.7)
MEAN PLATELET VOLUME: 7.4 fL (ref 6.8–10.7)
MONOCYTES ABSOLUTE COUNT: 0.6 10*9/L (ref 0.3–0.8)
MONOCYTES RELATIVE PERCENT: 7.8 %
NEUTROPHILS ABSOLUTE COUNT: 5.5 10*9/L (ref 1.8–7.8)
NEUTROPHILS RELATIVE PERCENT: 69.1 %
PLATELET COUNT: 297 10*9/L (ref 150–450)
RED BLOOD CELL COUNT: 5.03 10*12/L (ref 4.26–5.60)
RED CELL DISTRIBUTION WIDTH: 13.9 % (ref 12.2–15.2)
WBC ADJUSTED: 7.9 10*9/L (ref 3.6–11.2)

## 2022-09-13 LAB — COMPREHENSIVE METABOLIC PANEL
ALBUMIN: 4 g/dL (ref 3.4–5.0)
ALKALINE PHOSPHATASE: 121 U/L — ABNORMAL HIGH (ref 46–116)
ALT (SGPT): 23 U/L (ref 10–49)
ANION GAP: 6 mmol/L (ref 5–14)
AST (SGOT): 22 U/L (ref <=34)
BILIRUBIN TOTAL: 0.8 mg/dL (ref 0.3–1.2)
BLOOD UREA NITROGEN: 11 mg/dL (ref 9–23)
BUN / CREAT RATIO: 13
CALCIUM: 9.8 mg/dL (ref 8.7–10.4)
CHLORIDE: 109 mmol/L — ABNORMAL HIGH (ref 98–107)
CO2: 23.5 mmol/L (ref 20.0–31.0)
CREATININE: 0.84 mg/dL
EGFR CKD-EPI (2021) MALE: >90 mL/min/{1.73_m2} (ref >=60)
GLUCOSE RANDOM: 86 mg/dL (ref 70–179)
POTASSIUM: 4 mmol/L (ref 3.4–4.8)
PROTEIN TOTAL: 7.9 g/dL (ref 5.7–8.2)
SODIUM: 138 mmol/L (ref 135–145)

## 2022-09-13 MED ORDER — ASPIRIN 81 MG CHEWABLE TABLET
ORAL_TABLET | ORAL | 3 refills | 90 days | Status: CP
  Filled 2022-09-18: qty 90, 90d supply, fill #0

## 2022-09-13 MED ORDER — NICOTINE 21 MG/24 HR DAILY TRANSDERMAL PATCH
MEDICATED_PATCH | TRANSDERMAL | 0 refills | 42 days | Status: CP
Start: 2022-09-13 — End: 2022-10-25

## 2022-09-13 MED ORDER — WARFARIN 5 MG TABLET
ORAL_TABLET | Freq: Every day | ORAL | 11 refills | 30 days | Status: CP
Start: 2022-09-13 — End: 2023-09-13
  Filled 2022-09-18: qty 30, 30d supply, fill #0

## 2022-09-13 MED ORDER — METOPROLOL SUCCINATE ER 50 MG TABLET,EXTENDED RELEASE 24 HR
ORAL_TABLET | ORAL | 3 refills | 90 days | Status: CP
  Filled 2022-09-18: qty 90, 90d supply, fill #0

## 2022-09-13 MED ORDER — RISPERIDONE 1 MG TABLET
ORAL_TABLET | Freq: Every evening | ORAL | 0 refills | 30.00000 days | Status: CN
Start: 2022-09-13 — End: 2022-10-13
  Filled 2022-09-18: qty 90, 90d supply, fill #0

## 2022-09-13 MED ORDER — WARFARIN 4 MG TABLET
ORAL_TABLET | Freq: Every day | ORAL | 11 refills | 30 days
Start: 2022-09-13 — End: 2023-09-13

## 2022-09-13 MED ORDER — LISINOPRIL 5 MG TABLET
ORAL_TABLET | Freq: Every day | ORAL | 3 refills | 90 days | Status: CP
Start: 2022-09-13 — End: 2023-09-13
  Filled 2022-10-29: qty 90, 90d supply, fill #0

## 2022-09-13 MED ORDER — ATORVASTATIN 80 MG TABLET
ORAL_TABLET | Freq: Every day | ORAL | 0 refills | 30.00000 days | Status: CN
Start: 2022-09-13 — End: 2022-10-13
  Filled 2022-09-18: qty 90, 90d supply, fill #0

## 2022-09-13 NOTE — Unmapped (Signed)
[facility] at [facility]     Reason for visit: Follow up    Questions / Concerns that need to be addressed: no    Screening BP- 136/90 HR 90    Omron BPs (complete if screening BP has a systolic  > 130 or diastolic > 80)  BP#1 136/90 HR 90   BP#2 127/91 HR 90  BP#3 121/91 HR 90    Average BP 128/91 HR 90  (please note this as a comment in vitals)     PTHomeBP     HCDM reviewed and updated in Epic:    We are working to make sure all of our patients??? wishes are updated in Epic and part of that is documenting a Environmental health practitioner for each patient  A Health Care Decision Maker is someone you choose who can make health care decisions for you if you are not able - who would you most want to do this for you????  is already up to date.    HCDM (patient stated preference): [employer] - Other - [phone]

## 2022-09-13 NOTE — Unmapped (Signed)
COVID Education:  Make sure you perform good hand washing (lasting 20 seconds), continue to social distance and limit close personal contact (which may include new sexual partners or having multiple partners during this period).  Try to isolate at home but please find ways to keep in touch with those close to you, such as meeting up with them electronically or socially distanced, and the ability to go outdoors alone or separated from others  If you become ill with fever, respiratory illness, sudden loss of taste and smell, stomach issues, diarrhea, nausea, vomiting - contact clinic for further instructions.  You should go to the emergency department if you develop systems such as shortness of breath, confusion, lightheadedness when standing, high fever.   Here is some information about HIV and CoVid vaccines: [email or web address]  If you're interested in receiving the CoVid vaccine when you're eligible, here are some resources for you to check and make an appointment:  Your local health department   [email or web address] through [facility]  [email or web address]  [facility] (if you are an established patient with them)  [email or web address]    URGENT CARE  Please call ahead to speak with the nursing staff if you are in need of an urgent appointment.       MEDICATIONS  For refills please contact your pharmacy and ask them to electronically send or fax the request to the clinic.   Please bring all medications in original bottles to every appointment.    [facility] (formerly [facility]) or [employer] Eligibility (required even if you do not receive medication through [facility])  Please remember to renew your Juanell Fairly eligibility during renewal periods which occur twice a year: January-March and July-September.     The following are needed for each renewal:   - [facility] Identification (if you don't have one, then a bill with your name and address in [place])   - proof of income (award letter, W-2, or last three check stubs)   If you are unable to come in for renewal, let us know if we can mail, fax or e-mail paperwork to you.   HMAP Contact: [phone].     Lab info:  Your most recent CD4 T-cell counts and viral loads are below. Here are a few things to keep in mind when looking at your numbers:  Our goal is to get your virus to be undetectable and keep it undetectable. If the virus is undetectable you are much more likely to stay healthy.  We consider your viral load to be undetectable if it says <40 or if it says Not detected.  For most people, we're checking CD4 counts every other visit (once or twice a year, or sometimes even less).  It's normal for your CD4 count to be different from visit to visit.   You can help by taking your medications at about the same time, every single day. If you're having trouble with taking your medications, it's important to let us know.    Lab Results   Component Value Date    ACD4 308 (L) 06/28/2022    CD4 22 (L) 06/28/2022    HIVCP 11,694 (H) 06/26/2022    HIVRS Detected (A) 06/26/2022        Please note that your laboratory and other results may be visible to you in real time, possibly before they reach your provider. Please allow 48 hours for clinical interpretation of these results. Importantly, even if a result is flagged as abnormal, it may not be one  that impacts your health.    It was nice to have a visit with you today!  Follow-up information:        Provider today:  Varney Daily, FNP-BC      ID CLINIC address:   [facility]  [address]  [place], [place] [address]    Contact information:    The ID clinic phone number is [phone]   The ID clinic fax number is [phone]  For urgent issues on nights and weekends: Call the ID Physician on-call through the [facility] Operator at [phone].    Please sign up for My [place] Chart - This is a great way to review your labs and track your appointments    Please try to arrive 30 minutes BEFORE your scheduled appointment time!  This will give you time to fill out any front desk paperwork needed for your visit, and allow you to be seen as close to your scheduled appointment time as possible.

## 2022-09-13 NOTE — Unmapped (Signed)
Internal Medicine Clinic Visit    Reason for visit: Follow up    A/P:      Warren [name] was seen today for follow-up.    Diagnoses and all orders for this visit:    History of prosthetic aortic valve  Has missed a few doses of warfarin in the past few weeks. INR 1. Since he has a mechanical valve needs to be on warfarin.   -     warfarin (JANTOVEN) 5 MG tablet; Take 1 tablet (5 mg total) by mouth daily with evening meal. - refilled   - will call [facility] services pharmacy to see about pill packs    History of CVA (cerebrovascular accident)  Hospitalized in June.  -     Ambulatory referral to Home Health; Future  -     atorvastatin (LIPITOR) 80 MG tablet; Take 1 tablet (80 mg total) by mouth daily.  -     aspirin 81 MG chewable tablet; Chew 1 tablet (81 mg total) daily.  - recheck Lipid panel w/ next labs    Tobacco use  Interested in trying to cut back. Amenable to trying patches.  -     nicotine (NICODERM CQ) 21 mg/24 hr patch; Place 1 patch on the skin daily. 1 nicotine patch (21 mg/day) daily for six weeks    Right sided weakness  Ongoing since stroke.  -     Ambulatory referral to Home Health; Future    Hyperlipidemia, unspecified hyperlipidemia type  LDL 83 in June.  -     atorvastatin (LIPITOR) 80 MG tablet; Take 1 tablet (80 mg total) by mouth daily.    Paranoia (CMS-HCC)  Ongoing issue. Evaluated by psych in hospital and started on risperidone, which he says is helpful w/ mood. Amenable to establishing care w/ psychiatry.  -     risperiDONE (RISPERDAL) 1 MG tablet; Take 1 tablet (1 mg total) by mouth nightly.  -     Ambulatory referral to Psychiatry; Future - sent referral to [facility]    Heart failure with reduced ejection fraction  EF 35-40% in June. Follows w/ cardiology.  -     metoPROLOL succinate (TOPROL-XL) 50 MG 24 hr tablet; Take 1 tablet (50 mg total) by mouth daily.  - continue lisinopril   - discuss SGTL2 inhibitor next visit        Return in about 2 weeks (around 09/27/2022).      __________________________________________________________    HPI:    Last visit w/ me in October 2023. Saw cardiology 7/11. Hospitalized 6/4-6/12 for stroke, discharged from rehab 6/28.    Right arm and leg still feel weak  No falls recently  Interested in home health PT & OT  Some difficulty cooking bc hard to use R hand    No headaches  No chest pain or SOB  No LH or DZ    Wasn't regularly taking meds  Aide that he had was hiding them. Found it under his bed  Thinks he has missed 3 doses of warfarin in the past two weeks.  Last dose of warfarin last night  Has also missed some doses of other meds. Sometimes forgets to take it  Doesn't use a pill pack   Has spirits in his house. They prevent him from going to appointments - for example on day of appointment may cause his phone to be off  Doesn't have friends. No family around.    No bleeding   No new urinary issues  Normal BM every other day     Smokes close to a pack of cigarettes a day  Once a month may use cocaine, snorts.    Mood is better on risperidone  Prefers not to go to [facility] recovery services  Open to seeing psychiatry  __________________________________________________________    Problem List:  Patient Active Problem List   Diagnosis    History of prosthetic aortic valve    HIV (human immunodeficiency virus infection) (CMS-HCC)    Tobacco use disorder    AKI (acute kidney injury) (CMS-HCC)    Severe aortic insufficiency    HLD (hyperlipidemia)    Ischemic stroke (CMS-HCC)    Status post mechanical aortic valve replacement    Hx of repair of aortic root    Complete heart block (CMS-HCC)    Closed displaced fracture of right femoral neck (CMS-HCC)    Aortic valve endocarditis    Psychosis (CMS-HCC)       Medications:  Reviewed in EPIC  __________________________________________________________    Physical Exam:   Vital Signs:  Vitals:    09/13/22 1553   BP: 128/91   BP Site: L Arm   BP Position: Sitting   BP Cuff Size: Medium   Pulse: 90   Resp: 18   Temp: 36.3 ??C (97.3 ??F)   TempSrc: Temporal   SpO2: 97%   Weight: 92 kg (202 lb 13.2 oz)   Height: 182.9 cm (6' 0.01)       Gen: Well appearing male sitting in chair in NAD.  HEENT: [place]/AT. Sclera anicteric.   CV: RRR, no murmurs appreciated.  Pulm: Normal WOB. CTAB.  Abd: Soft, NTND.  Neuro: 5/5 strength in RUE & RLE except 4/5 right ankle flexion & extension.  Ext: No edema      Medication adherence and barriers to the treatment plan have been addressed. Opportunities to optimize healthy behaviors have been discussed. Patient / caregiver voiced understanding.        Lala Lund, MD

## 2022-09-13 NOTE — Unmapped (Signed)
Medication Requested: Warfarin, Aspirin, Atorvastatin, Metoprolol, and Risperdal      Future Appointments   Date Time Provider Department Center   09/13/2022  3:40 PM Bobbie Stack, MD [facility]   10/17/2022  2:45 PM Almubaslat, Abel Presto, MD [facility]   01/31/2023 11:00 AM Marlaine Hind, MD [facility]     Per Provider Note:     Standing order protocol requirements met?: No    Sent to: Provider for signing    Days Supply Given: TBD by Provider  Number of Refills: TBD by Provider
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
## 2022-09-13 NOTE — Unmapped (Addendum)
Thank you for coming today!    Increase warfarin to 5 mg daily.  I've refilled your meds.    Will work on finding a psychiatrist  Can use [email or web address] and betterhelp.com to find a therapist - can search by demographic information    I've placed referrals to home health for PT and OT

## 2022-09-14 LAB — LYMPH MARKER LIMITED,FLOW
ABSOLUTE CD3 CNT: 1156 {cells}/uL (ref 915–3400)
ABSOLUTE CD4 CNT: 374 {cells}/uL — ABNORMAL LOW (ref 510–2320)
ABSOLUTE CD8 CNT: 731 {cells}/uL (ref 180–1520)
CD3% (T CELLS): 68 % (ref 61–86)
CD4% (T HELPER): 22 % — ABNORMAL LOW (ref 34–58)
CD4:CD8 RATIO: 0.5 — ABNORMAL LOW (ref 0.9–4.8)
CD8% T SUPPRESR: 43 % — ABNORMAL HIGH (ref 12–38)

## 2022-09-14 LAB — HEPATITIS C ANTIBODY: HEPATITIS C ANTIBODY: NONREACTIVE

## 2022-09-14 LAB — HIV RNA, QUANTITATIVE, PCR
HIV RNA QNT RSLT: DETECTED — AB
HIV RNA: <20 {copies}/mL — ABNORMAL HIGH (ref <0)

## 2022-09-14 LAB — HEPATITIS B SURFACE ANTIBODY
HEPATITIS B SURFACE ANTIBODY QUANT: 322.39 m[IU]/mL — ABNORMAL HIGH (ref <8.00)
HEPATITIS B SURFACE ANTIBODY: REACTIVE — AB

## 2022-09-14 LAB — HEPATITIS A IGG: HEPATITIS A IGG: REACTIVE — AB

## 2022-09-14 LAB — SYPHILIS SCREEN: SYPHILIS RPR SCREEN: NONREACTIVE

## 2022-09-14 NOTE — Unmapped (Signed)
Referral Services Note     Duration of Intervention: 25 minutes    TYPE OF CONTACT: Phone and Inbasket with Provider(s): Francisca December, RN & Varney Daily, FNP    ASSESSMENT: Nurse Francisca December, RN inquired about patients insurance covering medical supplies. Nurse inquired bout mental health resources and food delivery services for patient. Nurse inquired about transportation to appointments for patient.    INTERVENTION:  SW contacted [facility] Medicare and was informed patients insurance could cover medical supplies needed. Patients provider will have to put in an order for patient to receive services.     SW provided Nurse with information to [facility] for patient to follow up regarding mental health services.    SW informed Nurse that patient could receive a home health aid through insurance to assist with needs. SW informed Nurse that patient could utilize insurance for appointments.    PLAN:  Clinical staff will assist patient as needed.    Warren [name], LCSWA  [facility]

## 2022-09-17 NOTE — Unmapped (Signed)
[facility]   Patient Onboarding/Medication Counseling    Warren [name] is a 47 y.o. male with HIV who I am counseling today on continuation of therapy.  I am speaking to the patient.    Was a Nurse, learning disability used for this call? No    Verified patient's date of birth / HIPAA.    Specialty medication(s) to be sent: Infectious Disease: Biktarvy      Non-specialty medications/supplies to be sent:   Aspirin 81mg   Atorvastatin 80mg   Metoprolol succinate 50mg   Risperidone 1mg   Warfarin 5mg         Medications not needed at this time: n/a         Biktarvy (bictegravir, emtricitabine, and tenofovir alafenamide)  50-200-25mg     The patient declined counseling on medication administration, missed dose instructions, goals of therapy, side effects and monitoring parameters, warnings and precautions, drug/food interactions, and storage, handling precautions, and disposal because they have taken the medication previously. The information in the declined sections below are for informational purposes only and was not discussed with patient.       Medication & Administration     Dosage: Take 1 tablet by mouth daily    Administration: Take without regard to food    Adherence/Missed dose instructions: take missed dose as soon as you remember. If it is close to the time of your next dose, skip the dose and resume with your next scheduled dose.    Goals of Therapy     To suppress viral replication and keep patient's HIV undetectable by lab tests    Side Effects & Monitoring Parameters     Common Side Effects:  Diarrhea  Upset stomach  Headache  Changes in Weight  Changes in mood    The following side effects should be reported to the provider:     If patient experiences: signs of an allergic reaction (rash; hives; itching; red, swollen, blistered, or peeling skin with or without fever; wheezing; tightness in the chest or throat; trouble breathing, swallowing, or talking; unusual hoarseness; or swelling of the mouth, face, lips, tongue, or throat)  signs of kidney problems (unable to pass urine, change in how much urine is passed, blood in the urine, or a big weight gain)  signs of liver problems (dark urine, feeling tired, not hungry, upset stomach or stomach pain, light-colored stools, throwing up, or yellow skin or eyes)  signs of lactic acidosis (fast breathing, fast heartbeat, a heartbeat that does not feel normal, very bad upset stomach or throwing up, feeling very sleepy, shortness of breath, feeling very tired or weak, very bad dizziness, feeling cold, or muscle pain or cramps)  Weight gain: some patients have reported weight gain after starting this medication. The amount of weight can vary.    Monitoring Parameters:  CD4  Count  HIV RNA plasma levels,  Liver function  Total bilirubin  serum creatinine  urine glucose  urine protein (prior to or when initiating therapy and as clinically indicated during therapy);       Drug/Food Interactions     Medication list reviewed in Epic. The patient was instructed to inform the care team before taking any new medications or supplements. No drug interactions identified.   Calcium Salts: May decrease the serum concentration of Biktarvy. If taken with food, Biktarvy can be administered with calcium salts.   Iron Preparations: May decrease the serum concentration of Biktarvy. If taken with food, Biktarvy can be administered with Ferrous sulfate. If taken on an  empty stomach, Biktarvy must be taken 2 hours before ferrous sulfate. Avoid other iron salts.    Contraindications, Warnings, & Precautions     Black Box Warning: Severe acute exacertbations of HBV have been reported in patients coinfected with HIV-1 and HBV fllowing discontinuation of therapy  Coadministration with dofetilide, rifampin is contraindicated  Immune reconstitution syndrome: Patients may develop immune reconstitution syndrome, resulting in the occurrence of an inflammatory response to an indolent or residual opportunistic infection or activation of autoimmune disorders (eg, Graves disease, polymyositis, Guillain-Barr?? syndrome, autoimmune hepatitis)   Lactic acidosis/hepatomegaly  Renal toxicity: patients with preexisting renal impairment and those taking nephrotoxic agents (including NSAIDs) are at increased risk.     Storage, Handling Precautions, & Disposal     Store in the original container at room temperature.   Keep lid tightly closed.   Store in a dry place. Do not store in a bathroom.   Keep all drugs in a safe place. Keep all drugs out of the reach of children and pets.   Throw away unused or expired drugs. Do not flush down a toilet or pour down a drain unless you are told to do so. Check with your pharmacist if you have questions about the best way to throw out drugs. There may be drug take-back programs in your area.      Current Medications (including OTC/herbals), Comorbidities and Allergies     Current Outpatient Medications   Medication Sig Dispense Refill    aspirin 81 MG chewable tablet Chew 1 tablet (81 mg total) daily. 90 tablet 3    atorvastatin (LIPITOR) 80 MG tablet Take 1 tablet (80 mg total) by mouth daily. 90 tablet 3    bictegrav-emtricit-tenofov ala (BIKTARVY) 50-200-25 mg tablet Take 1 tablet by mouth daily. 30 tablet 11    lisinopril (PRINIVIL,ZESTRIL) 5 MG tablet Take 1 tablet (5 mg total) by mouth daily. 90 tablet 3    metoPROLOL succinate (TOPROL-XL) 50 MG 24 hr tablet Take 1 tablet (50 mg total) by mouth daily. 90 tablet 3    nicotine (NICODERM CQ) 21 mg/24 hr patch Place 1 patch on the skin daily. 1 nicotine patch (21 mg/day) daily for six weeks 42 patch 0    risperiDONE (RISPERDAL) 1 MG tablet Take 1 tablet (1 mg total) by mouth nightly. 90 tablet 3    warfarin (JANTOVEN) 5 MG tablet Take 1 tablet (5 mg total) by mouth daily with evening meal. 30 tablet 11     No current facility-administered medications for this visit.       No Known Allergies    Patient Active Problem List Diagnosis    History of prosthetic aortic valve    HIV (human immunodeficiency virus infection) (CMS-HCC)    Tobacco use disorder    AKI (acute kidney injury) (CMS-HCC)    Severe aortic insufficiency    HLD (hyperlipidemia)    Ischemic stroke (CMS-HCC)    Status post mechanical aortic valve replacement    Hx of repair of aortic root    Complete heart block (CMS-HCC)    Closed displaced fracture of right femoral neck (CMS-HCC)    Aortic valve endocarditis    Psychosis (CMS-HCC)    HFrEF (heart failure with reduced ejection fraction) (CMS-HCC)       Reviewed and up to date in Epic.    Appropriateness of Therapy     Acute infections noted within Epic:  No active infections  Patient reported infection: None    Is the medication  and dose appropriate based on diagnosis, medication list, comorbidities, allergies, medical history, patient???s ability to self-administer the medication, and therapeutic goals? Yes    Prescription has been clinically reviewed: Yes      Baseline Quality of Life Assessment      How many days over the past month did your HIV  keep you from your normal activities? For example, brushing your teeth or getting up in the morning. 0    Financial Information     Medication Assistance provided: None Required    Anticipated copay of $0.00 reviewed with patient. Verified delivery address.    Delivery Information     Scheduled delivery date: 09/19/22    Expected start date: continuation of therapy      Medication will be delivered via UPS to the prescription address in [facility].  This shipment will not require a signature.      Explained the services we provide at [facility] and that each month we would call to set up refills.  Stressed importance of returning phone calls so that we could ensure they receive their medications in time each month.  Informed patient that we should be setting up refills 7-10 days prior to when they will run out of medication.  A pharmacist will reach out to perform a clinical assessment periodically.  Informed patient that a welcome packet, containing information about our pharmacy and other support services, a Notice of Privacy Practices, and a drug information handout will be sent.      The patient or caregiver noted above participated in the development of this care plan and knows that they can request review of or adjustments to the care plan at any time.      Patient or caregiver verbalized understanding of the above information as well as how to contact the pharmacy at [phone] option 4 with any questions/concerns.  The pharmacy is open Monday through Friday 8:30am-4:30pm.  A pharmacist is available 24/7 via pager to answer any clinical questions they may have.    Patient Specific Needs     Does the patient have any physical, cognitive, or cultural barriers? No    Does the patient have adequate living arrangements? (i.e. the ability to store and take their medication appropriately) Yes    Did you identify any home environmental safety or security hazards? No    Patient prefers to have medications discussed with  Patient     Is the patient or caregiver able to read and understand education materials at a high school level or above? Yes    Patient's primary language is  English     Is the patient high risk? No    SOCIAL DETERMINANTS OF HEALTH     At the [facility], we have learned that life circumstances - like trouble affording food, housing, utilities, or transportation can affect the health of many of our patients.   That is why we wanted to ask: are you currently experiencing any life circumstances that are negatively impacting your health and/or quality of life? Patient declined to answer    Social Determinants of Health     Financial Resource Strain: Low Risk  (07/04/2022)    Overall Financial Resource Strain (CARDIA)     Difficulty of Paying Living Expenses: Not hard at all   Internet Connectivity: Internet connectivity concern identified (07/04/2022)    Internet Connectivity     Do you have access to internet services: No     How  do you connect to the internet: Personal Device at home     Is your internet connection strong enough for you to watch video on your device without major problems?: Yes     Do you have enough data to get through the month?: Yes     Does at least one of the devices have a camera that you can use for video chat?: Yes   Food Insecurity: No Food Insecurity (07/04/2022)    Hunger Vital Sign     Worried About Running Out of Food in the Last Year: Never true     Ran Out of Food in the Last Year: Never true   Tobacco Use: High Risk (09/13/2022)    Patient History     Smoking Tobacco Use: Every Day     Smokeless Tobacco Use: Never     Passive Exposure: Not on file   Housing/Utilities: Low Risk  (07/04/2022)    Housing/Utilities     Within the past 12 months, have you ever stayed: outside, in a car, in a tent, in an overnight shelter, or temporarily in someone else's home (i.e. couch-surfing)?: No     Are you worried about losing your housing?: No     Within the past 12 months, have you been unable to get utilities (heat, electricity) when it was really needed?: No   Alcohol Use: Not At Risk (03/07/2022)    Alcohol Use     How often do you have a drink containing alcohol?: 2 - 3 times per week     How many drinks containing alcohol do you have on a typical day when you are drinking?: 3 - 4     How often do you have 5 or more drinks on one occasion?: Never   Transportation Needs: No Transportation Needs (07/20/2022)    PRAPARE - Transportation     Lack of Transportation (Medical): No     Lack of Transportation (Non-Medical): No   Substance Use: High Risk (07/04/2022)    Substance Use     Taken prescription drugs for non-medical reasons: Never     Taken illegal drugs: Monthly     Patient indicated they have taken drugs in the past year for non-medical reasons: Yes, [positive answer(s)]: Yes   Health Literacy: Low Risk  (07/20/2022)    Health Literacy     : Never   Physical Activity: Inactive (07/04/2022)    Exercise Vital Sign     Days of Exercise per Week: 0 days     Minutes of Exercise per Session: 0 min   Interpersonal Safety: Not At Risk (07/04/2022)    Interpersonal Safety     Unsafe Where You Currently Live: No     Physically Hurt by Anyone: No     Abused by Anyone: No   Stress: Stress Concern Present (07/04/2022)    [employer] of Occupational Health - Occupational Stress Questionnaire     Feeling of Stress : To some extent   Intimate Partner Violence: Not At Risk (07/04/2022)    Humiliation, Afraid, Rape, and Kick questionnaire     Fear of Current or Ex-Partner: No     Emotionally Abused: No     Physically Abused: No     Sexually Abused: No   Depression: Not at risk (07/04/2022)    PHQ-2     PHQ-2 Score: 0   Social Connections: Moderately Isolated (07/04/2022)    Social Connection and Isolation Panel [NHANES]     Frequency of Communication  with Friends and Family: Once a week     Frequency of Social Gatherings with Friends and Family: Once a week     Attends Religious Services: 1 to 4 times per year     Active Member of [employer] or Organizations: Yes     Attends Banker Meetings: Never     Marital Status: Never married       Would you be willing to receive help with any of the needs that you have identified today? Not applicable       [name], PharmD  [facility] Specialty Pharmacist

## 2022-09-17 NOTE — Unmapped (Signed)
[facility] Nursing Note     TYPE OF ENCOUNTER: Face to Face     REASON: In-person Appointment     ADHERENCE: Pt. 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 He presented outside of the window for stroke interventions. He was admitted to inpatient rehab from 6/14-6/28. Orders were put in for a wheel chair, shower chair, commode, and walker, but the patient never received them. He was also referred to outpatient physical therapy and occupational therapy, but they were not able to reach him to set up an appointment. Pt. States,  The spirits in my house turn off my ringer, so I dont hear my phone ring, they dont want me to get better.     Today, the patient has difficulty with mobility, when he walks he drags his right foot. His gait is slow and unsteady, and he sometimes uses the wall for support. Pt. States that a walker would make mobility much easier. He was also prescribed several medications ( Lisinopril, Atorvastatin, Aspirin, metoprolol, and risperidone.  upon discharge, but they were sent to [facility] and there was only a 30 day supply of medications dispensed. He hasn't received any of these medications since discharge, because he doesn't have transportation.     During the visit, he stated that he feels like his mind is racing. But when he was taking Risperidone, he could think more clearly.    INTERVENTION: RN sent refill request for Lisinopril, Atorvastatin, Aspirin, Metoprolol, and Risperidone, to the Patients PCP. Pt. Will have his medications filled at the [facility], so that all of his medications can come from the same place, and be delivered together.  The patient's ID provider, contacted his PCP to request that home health orders be put in.  RN also sent the orders for a bedside commode, walker, wheelchair, and shower chair to [facility]. RN also sent the most recent note that supports why the patient needs the above medical supplies.     PLAN: RN communicated with the [facility] Pharmacist to make sure that medication delivery was set up for the patient. RN will also follow up with [facility] to make sure the patient receives the needed medical equipment.     DURATION: 60 min

## 2022-09-18 MED FILL — BIKTARVY 50 MG-200 MG-25 MG TABLET: ORAL | 30 days supply | Qty: 30 | Fill #1

## 2022-09-19 LAB — VITAMIN D 25 HYDROXY: VITAMIN D, TOTAL (25OH): 7.5 ng/mL — ABNORMAL LOW (ref 20.0–80.0)

## 2022-09-20 DIAGNOSIS — E559 Vitamin D deficiency, unspecified: Principal | ICD-10-CM

## 2022-09-20 MED ORDER — ERGOCALCIFEROL (VITAMIN D2) 1,250 MCG (50,000 UNIT) CAPSULE
ORAL_CAPSULE | ORAL | 0 refills | 56 days | Status: CP
Start: 2022-09-20 — End: 2022-11-09

## 2022-09-20 NOTE — Unmapped (Signed)
[facility] Internal Medicine   CARE MANAGEMENT ENCOUNTER    Date of Service:  09/20/2022      Service:  Care Coordination - general    Purpose of contact:     Care Manager (CM) received request to help find an external Home Health [facility]) agency for Physical Therapy and Occupational Therapy services    CM completed the following related to the above request:  Reviewed chart  Patient lives in [place] [facility]  Patient has [employer] insurance    Contacted [facility] Liaison for [facility], Noel Christmas ([place]: [phone], E: [email or web address])  Spoke with Sheran Lawless (covering Danielle)  Referral accepted with a start of care date of 09/22/2022    [facility] Agency contact to pull referral and supporting documentation from Epic. No need to fax referral.    A copy of this Patient Outreach Encounter was sent to patient's Primary Care Provider

## 2022-10-02 NOTE — Unmapped (Signed)
[facility] Nursing Note     TYPE OF ENCOUNTER: Text    REASON: Medication Access/ Mental Health Services.     ADHERENCE: Not assessed at this time    ASSESSMENT: RN informed the patient that Warren [name] from the shared Services Pharmacy will contact him to set up medication delivery. RN also discussed the possibility of the patient of receiving Mental health services at [facility]. Please see the text encounter below:    RN: Warren [name]! Question: We have Warren [name] listed as an emergency contact , do you want me to take it out of your chart?  CT: Hey yes take her off  RN: Ok, how was your weekend?  CT: It was ok. Nothing special. How was yours?  RN: It was good. Warren [name], with [facility] shared services Pharmacy may try and call you to set up delivery for your meds.  CT: Bet. Thank you  RN: Warren [name] and I were talking and we think that a place called  [facility], would be helpful in managing your racing thoughts that you were telling me about. I know that the medicine helped, but this place may be able to help as well. Let me know what you think, and I can send you the info.  CT: Idk. Can we talk about it some more?  RN: Sure. Let me know when is a good time for [place] to talk.    INTERVENTION: RN informed the patient that [facility], may be a place where the patient could receive mental health services. RN also informed the patient that the pharmacist at [facility] would be contacting him to set up medication delivery.    PLAN: RN will continue to follow up with the patient in reference to medication access and mental health services.     DURATION: 20 min

## 2022-10-02 NOTE — Unmapped (Signed)
[facility] Nursing Note     TYPE OF ENCOUNTER: Text    REASON: Medication Access    ADHERENCE: Not assessed at this time.     ASSESSMENT: Medications were delivered on 09/18/2022. Please see the text encounter below:     RN: [name], did you get your medicine yesterday.   CT: Yes I did it came today. Thank you  RN: Ok, and someone may be calling you from the medical supply store maybe dropping off some stuff like a walker to help you get around better    INTERVENTION: RN contacted [name], at [facility] pharmacy to set up delivery for the patient's medications. Pharmacist agreed to contact this RN to set up future deliveries for this patient's medications.      PLAN: RN will continue to follow up with the patient.     DURATION: 5 min

## 2022-10-02 NOTE — Unmapped (Signed)
[facility] Nursing Note     TYPE OF ENCOUNTER: Text     REASON:Medical Supplies    ADHERENCE: Not assessed at this time.     ASSESSMENT: During his appointment on 08/22, the patient told this RN that he had not received a wheelchair, walker or shower chair to help with mobility around the house. RN sent the medical supply orders to [facility], in hopes to get the patient assistive devices delivered to his home. However, the patient states that the devices were an out of pocket cost that his insurance didn't cover. RN contacted the [facility] in [place], for clarification. The representative at this agency explained that the patient had already received a wheel chair, that was delivered on 06/27. Please see the text encounter below:    RN: Warren [name], how are you doing?  CT: Gm. Warren [name] I'm doing good hru?  RN: Im good. Did you get your medicine from [facility] pharmacy?  CT: Yes I did. Thank you  RN: Ok, no problem. The medical supply store was supposed to contact you about getting you a walker, and some more supplies. Did they call you?  CT: Yeah the said that it's out of pocket cost my insurance doesn't cover.  RN: Really?? That is really strange... especially if you have both Medicare and Medicaid.  RN: Ok, I spoke to the Medical Supply, and I got some clarification. I was told that your insurance will pay for one mobility item. They said they dropped off the wheelchair. Did you get that?  CT:No. I got the chair when I left the rehabilitation. They haven't dropped anything off at my door.  RN: But you have the wheel chair?  CT: Yes  RN: Ok, good. I just wanted to make sure you had something with you at home. I thought you didnt have anything.    INTERVENTION: RN contacted [employer] store for clarification on orders and the patient's insurance. The agent confirmed that the patient received a wheelchair on 6/27, but he is only covered for one mobility item. He chose to take the wheel chair, and declined the walker and shower chair.     PLAN: RN will continue to follow up with the patient.     DURATION: 20 min

## 2022-10-04 NOTE — Unmapped (Signed)
The PAC has received an incoming clinical call:    Caller name: Mardelle Matte from [facility]   Best callback number: [phone]   Relationship to Patient: physical therapist    Describe the reason for the call: AT today's visit therapist noticed right foot drop. He recommends leg brace.    Tacy Dura  10/04/22

## 2022-10-10 ENCOUNTER — Institutional Professional Consult (permissible substitution): Admit: 2022-10-10 | Discharge: 2022-10-11 | Payer: MEDICARE

## 2022-10-10 DIAGNOSIS — Z4502 Encounter for adjustment and management of automatic implantable cardiac defibrillator: Principal | ICD-10-CM

## 2022-10-10 NOTE — Unmapped (Signed)
Attempted to reach patient regarding disconnected phone APP. Patient is currently overdue for a remote.  No answer, but was able to leave a voicemail.  Provided our call back number for any needed assistance.

## 2022-10-11 ENCOUNTER — Ambulatory Visit: Admit: 2022-10-11 | Discharge: 2022-10-11 | Payer: MEDICARE

## 2022-10-11 ENCOUNTER — Ambulatory Visit: Admit: 2022-10-11 | Discharge: 2022-10-11 | Payer: MEDICARE | Attending: Family | Primary: Family

## 2022-10-11 DIAGNOSIS — Z79899 Other long term (current) drug therapy: Principal | ICD-10-CM

## 2022-10-11 DIAGNOSIS — B20 Human immunodeficiency virus [HIV] disease: Principal | ICD-10-CM

## 2022-10-11 DIAGNOSIS — Z1212 Encounter for screening for malignant neoplasm of rectum: Principal | ICD-10-CM

## 2022-10-11 DIAGNOSIS — Z23 Encounter for immunization: Principal | ICD-10-CM

## 2022-10-11 DIAGNOSIS — Z1159 Encounter for screening for other viral diseases: Principal | ICD-10-CM

## 2022-10-11 DIAGNOSIS — Z1151 Encounter for screening for human papillomavirus (HPV): Principal | ICD-10-CM

## 2022-10-11 DIAGNOSIS — Z9189 Other specified personal risk factors, not elsewhere classified: Principal | ICD-10-CM

## 2022-10-11 DIAGNOSIS — Z1633 Resistance to antiviral drug(s): Principal | ICD-10-CM

## 2022-10-11 DIAGNOSIS — Z113 Encounter for screening for infections with a predominantly sexual mode of transmission: Principal | ICD-10-CM

## 2022-10-11 DIAGNOSIS — Z5181 Encounter for therapeutic drug level monitoring: Principal | ICD-10-CM

## 2022-10-11 DIAGNOSIS — Z7289 Other problems related to lifestyle: Principal | ICD-10-CM

## 2022-10-11 LAB — CBC W/ AUTO DIFF
BASOPHILS ABSOLUTE COUNT: 0 10*9/L (ref 0.0–0.1)
BASOPHILS RELATIVE PERCENT: 0.8 %
EOSINOPHILS ABSOLUTE COUNT: 0.1 10*9/L (ref 0.0–0.5)
EOSINOPHILS RELATIVE PERCENT: 1.5 %
HEMATOCRIT: 46.3 % (ref 39.0–48.0)
HEMOGLOBIN: 15 g/dL (ref 12.9–16.5)
LYMPHOCYTES ABSOLUTE COUNT: 2.1 10*9/L (ref 1.1–3.6)
LYMPHOCYTES RELATIVE PERCENT: 33.2 %
MEAN CORPUSCULAR HEMOGLOBIN CONC: 32.4 g/dL (ref 32.0–36.0)
MEAN CORPUSCULAR HEMOGLOBIN: 29.1 pg (ref 25.9–32.4)
MEAN CORPUSCULAR VOLUME: 90 fL (ref 77.6–95.7)
MEAN PLATELET VOLUME: 7.5 fL (ref 6.8–10.7)
MONOCYTES ABSOLUTE COUNT: 0.4 10*9/L (ref 0.3–0.8)
MONOCYTES RELATIVE PERCENT: 6.7 %
NEUTROPHILS ABSOLUTE COUNT: 3.7 10*9/L (ref 1.8–7.8)
NEUTROPHILS RELATIVE PERCENT: 57.8 %
PLATELET COUNT: 279 10*9/L (ref 150–450)
RED BLOOD CELL COUNT: 5.14 10*12/L (ref 4.26–5.60)
RED CELL DISTRIBUTION WIDTH: 13.4 % (ref 12.2–15.2)
WBC ADJUSTED: 6.5 10*9/L (ref 3.6–11.2)

## 2022-10-11 LAB — BASIC METABOLIC PANEL
ANION GAP: 7 mmol/L (ref 5–14)
BLOOD UREA NITROGEN: 10 mg/dL (ref 9–23)
BUN / CREAT RATIO: 10
CALCIUM: 9.6 mg/dL (ref 8.7–10.4)
CHLORIDE: 110 mmol/L — ABNORMAL HIGH (ref 98–107)
CO2: 21.3 mmol/L (ref 20.0–31.0)
CREATININE: 0.97 mg/dL
EGFR CKD-EPI (2021) MALE: >90 mL/min/{1.73_m2} (ref >=60)
GLUCOSE RANDOM: 84 mg/dL (ref 70–179)
POTASSIUM: 3.7 mmol/L (ref 3.4–4.8)
SODIUM: 138 mmol/L (ref 135–145)

## 2022-10-11 LAB — ALT: ALT (SGPT): 23 U/L (ref 10–49)

## 2022-10-11 LAB — AST: AST (SGOT): 23 U/L (ref <=34)

## 2022-10-11 LAB — BILIRUBIN, TOTAL: BILIRUBIN TOTAL: 0.6 mg/dL (ref 0.3–1.2)
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
## 2022-10-11 NOTE — Unmapped (Signed)
COVID Education:  Make sure you perform good hand washing (lasting 20 seconds), continue to social distance and limit close personal contact (which may include new sexual partners or having multiple partners during this period).  Try to isolate at home but please find ways to keep in touch with those close to you, such as meeting up with them electronically or socially distanced, and the ability to go outdoors alone or separated from others  If you become ill with fever, respiratory illness, sudden loss of taste and smell, stomach issues, diarrhea, nausea, vomiting - contact clinic for further instructions.  You should go to the emergency department if you develop systems such as shortness of breath, confusion, lightheadedness when standing, high fever.   Here is some information about HIV and CoVid vaccines: [email or web address]  If you're interested in receiving the CoVid vaccine when you're eligible, here are some resources for you to check and make an appointment:  Your local health department   [email or web address] through [facility]  [email or web address]  [facility] (if you are an established patient with them)  [email or web address]    URGENT CARE  Please call ahead to speak with the nursing staff if you are in need of an urgent appointment.       MEDICATIONS  For refills please contact your pharmacy and ask them to electronically send or fax the request to the clinic.   Please bring all medications in original bottles to every appointment.    HMAP (formerly ADAP) or [employer] Eligibility (required even if you do not receive medication through [facility])  Please remember to renew your Juanell Fairly eligibility during renewal periods which occur twice a year: January-March and July-September.     The following are needed for each renewal:   - [facility] Identification (if you don't have one, then a bill with your name and address in [place])   - proof of income (award letter, W-2, or last three check stubs)   If you are unable to come in for renewal, let us know if we can mail, fax or e-mail paperwork to you.   HMAP Contact: [phone].     Lab info:  Your most recent CD4 T-cell counts and viral loads are below. Here are a few things to keep in mind when looking at your numbers:  Our goal is to get your virus to be undetectable and keep it undetectable. If the virus is undetectable you are much more likely to stay healthy.  We consider your viral load to be undetectable if it says <40 or if it says Not detected.  For most people, we're checking CD4 counts every other visit (once or twice a year, or sometimes even less).  It's normal for your CD4 count to be different from visit to visit.   You can help by taking your medications at about the same time, every single day. If you're having trouble with taking your medications, it's important to let us know.    Lab Results   Component Value Date    ACD4 374 (L) 09/13/2022    CD4 22 (L) 09/13/2022    HIVCP <20 (H) 09/13/2022    HIVRS Detected (A) 09/13/2022        Please note that your laboratory and other results may be visible to you in real time, possibly before they reach your provider. Please allow 48 hours for clinical interpretation of these results. Importantly, even if a result is flagged as abnormal, it may not be one  that impacts your health.    It was nice to have a visit with you today!  Follow-up information:        Provider today:  Varney Daily, FNP-BC      ID CLINIC address:   [facility] at [facility]  [address]  [place], [place] [address]    Contact information:    The ID clinic phone number is [phone]   The ID clinic fax number is [phone]  For urgent issues on nights and weekends: Call the ID Physician on-call through the [facility] Operator at [phone].    Please sign up for My [place] Chart - This is a great way to review your labs and track your appointments    Please try to arrive 30 minutes BEFORE your scheduled appointment time!  This will give you time to fill out any front desk paperwork needed for your visit, and allow you to be seen as close to your scheduled appointment time as possible.

## 2022-10-11 NOTE — Unmapped (Signed)
Started assessment with patient options: in clinic      Patient declined RW services at this time.     Housing Status  Stable/Permanent    Insurance  Medicaid and Medicare Part C    Reason for Declining: Above income. Is not interested in [place] benefits for 2025.        Mickle Asper,  Benefits & Eligibility Coordinator  Time of Intervention: 5 minutes

## 2022-10-12 LAB — HIV RNA, QUANTITATIVE, PCR: HIV RNA QNT RSLT: NOT DETECTED

## 2022-10-12 NOTE — Unmapped (Signed)
The admin team has completed this request. The patient has been reached and is scheduled for:  - VISIT TYPE: Return   - DATE: 12/24/22  - TIME: 2:25 pm  - PROVIDER: Glenetta Hew  - ADDITIONAL NOTES: Spoke with patient by phone they voiced that Monday December 2nd would work best for them. Closing the loop.

## 2022-10-14 LAB — HEPATITIS C ANTIBODY: HEPATITIS C ANTIBODY: NONREACTIVE
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
## 2022-10-15 NOTE — Unmapped (Signed)
[facility] Nursing Note     TYPE OF ENCOUNTER: Face to Face    REASON: Appointment     ADHERENCE:Pt. Reports he has been off of meds for two weeks because of depression.     ASSESSMENT: RN sent the following text to the patient to remind him of his appointment:    RN: Warren [name], you have an appt at 1:30 today, not sure if you remembered.    The patient did not respond but later presented to clinic. Pt. Reported to the provider, that he had been off of Medication for 2 weeks because of depression. Pt. Currently gets meds delivered through [facility] pharmacy, so medication access is not an issue.     The patient also had questions about whether or not he could receive a walker. RN explained that the patient's insurance would only allow mobility device, and he has already received the wheelchair.     INTERVENTION:    PLAN: RN will continue to follow up with the patient     DURATION: 10 min

## 2022-10-19 NOTE — Unmapped (Signed)
The [facility] has made a second and final attempt to reach this patient to refill the following medication:BIKTARVY.      We have left voicemails on the following phone numbers: [phone] and have sent a text message to the following phone numbers: [phone] .    Dates contacted: 10/10/22 and 10/19/22  Last scheduled delivery: 09/18/22    The patient may be at risk of non-compliance with this medication. The patient should call the [facility] at [phone]  Option 4, then Option 4: Infectious Disease, Transplant to refill medication.    Kerby Less   [facility]

## 2022-10-22 NOTE — Unmapped (Signed)
[facility] Clinical Assessment & Refill Coordination Note    Warren [name]., DOB: [birth year]  Phone: [phone] (home)     All above HIPAA information was verified with  Francisca December, RN at [facility]      Was a translator used for this call? No    Specialty Medication(s):   Infectious Disease: Biktarvy     Current Outpatient Medications   Medication Sig Dispense Refill    aspirin 81 MG chewable tablet Chew 1 tablet (81 mg total) daily. 90 tablet 3    atorvastatin (LIPITOR) 80 MG tablet Take 1 tablet (80 mg total) by mouth daily. 90 tablet 3    bictegrav-emtricit-tenofov ala (BIKTARVY) 50-200-25 mg tablet Take 1 tablet by mouth daily. 30 tablet 11    ergocalciferol-1,250 mcg, 50,000 unit, (DRISDOL) 1,250 mcg (50,000 unit) capsule Take 1 capsule (1,250 mcg total) by mouth once a week for 8 doses. 8 capsule 0    lisinopril (PRINIVIL,ZESTRIL) 5 MG tablet Take 1 tablet (5 mg total) by mouth daily. 90 tablet 3    metoPROLOL succinate (TOPROL-XL) 50 MG 24 hr tablet Take 1 tablet (50 mg total) by mouth daily. 90 tablet 3    nicotine (NICODERM CQ) 21 mg/24 hr patch Place 1 patch on the skin daily. 1 nicotine patch (21 mg/day) daily for six weeks 42 patch 0    risperiDONE (RISPERDAL) 1 MG tablet Take 1 tablet (1 mg total) by mouth nightly. 90 tablet 3    warfarin (JANTOVEN) 5 MG tablet Take 1 tablet (5 mg total) by mouth daily with evening meal. 30 tablet 11     No current facility-administered medications for this visit.        Changes to medications:  no changes    No Known Allergies    Changes to allergies: No    SPECIALTY MEDICATION ADHERENCE     Biktarvy 50-200-25 mg: approximately 7  days of medicine on hand       Medication Adherence    Patient reported X missed doses in the last month: >5  Specialty Medication: Biktarvy 50-200-25mg : missed at least 2 weeks due to depression  Patient is on additional specialty medications: No  Any gaps in refill history greater than 2 weeks in the last 3 months: yes  Demonstrates understanding of importance of adherence: no  Informant: patient  Provider-estimated medication adherence level: poor  Patient is at risk for Non-Adherence: Yes  Reasons for non-adherence: psychosocial  Confirmed plan for next specialty medication refill: delivery by pharmacy  Refills needed for supportive medications: not needed          Specialty medication(s) dose(s) confirmed: Regimen is correct and unchanged.     Are there any concerns with adherence? Yes: missed at least 2 weeks of medication due to being depressed.    Adherence counseling provided?  No.  Varney Daily, FNP and Francisca December, RN discussed the importance of adherence with him. A pill box has been tried before.      CLINICAL MANAGEMENT AND INTERVENTION      Clinical Benefit Assessment:    Do you feel the medicine is effective or helping your condition? Yes    HIV ASSOCIATED LABS:     Lab Results   Component Value Date/Time    HIVRS Not Detected 10/11/2022 03:49 PM    HIVRS Detected (A) 09/13/2022 03:34 PM    HIVRS Detected (A) 06/26/2022 12:45 PM    HIVCP <20 (H) 09/13/2022  03:34 PM    HIVCP 11,694 (H) 06/26/2022 12:45 PM    HIVCP 7,519 (H) 03/07/2022 09:56 AM    ACD4 374 (L) 09/13/2022 03:34 PM    ACD4 308 (L) 06/28/2022 05:51 AM    ACD4 324 (L) 03/07/2022 09:56 AM       Clinical Benefit counseling provided? Labs from 10/11/22 show evidence of clinical benefit    Adverse Effects Assessment:    Are you experiencing any side effects? No    Are you experiencing difficulty administering your medicine? No    Quality of Life Assessment:    How many days over the past month did your HIV  keep you from your normal activities? For example, brushing your teeth or getting up in the morning. 0    Have you discussed this with your provider? Not needed    Acute Infection Status:    Acute infections noted within Epic:  No active infections  Patient reported infection: None    Therapy Appropriateness:    Is therapy appropriate based on current medication list, adverse reactions, adherence, clinical benefit and progress toward achieving therapeutic goals? Yes, therapy is appropriate and should be continued     DISEASE/MEDICATION-SPECIFIC INFORMATION      N/A    HIV: Not Applicable    PATIENT SPECIFIC NEEDS     Does the patient have any physical, cognitive, or cultural barriers? No    Is the patient high risk? No    Did the patient require a clinical intervention? No    Does the patient require physician intervention or other additional services (i.e., nutrition, smoking cessation, social work)? No    SOCIAL DETERMINANTS OF HEALTH     At the [facility], we have learned that life circumstances - like trouble affording food, housing, utilities, or transportation can affect the health of many of our patients.   That is why we wanted to ask: are you currently experiencing any life circumstances that are negatively impacting your health and/or quality of life? Patient declined to answer    Social Determinants of Health     Food Insecurity: No Food Insecurity (07/04/2022)    Hunger Vital Sign     Worried About Running Out of Food in the Last Year: Never true     Ran Out of Food in the Last Year: Never true   Internet Connectivity: Internet connectivity concern identified (07/04/2022)    Internet Connectivity     Do you have access to internet services: No     How do you connect to the internet: Personal Device at home     Is your internet connection strong enough for you to watch video on your device without major problems?: Yes     Do you have enough data to get through the month?: Yes     Does at least one of the devices have a camera that you can use for video chat?: Yes   Housing/Utilities: Low Risk  (07/04/2022)    Housing/Utilities     Within the past 12 months, have you ever stayed: outside, in a car, in a tent, in an overnight shelter, or temporarily in someone else's home (i.e. couch-surfing)?: No     Are you worried about losing your housing?: No     Within the past 12 months, have you been unable to get utilities (heat, electricity) when it was really needed?: No   Tobacco Use: High Risk (09/13/2022)    Patient History     Smoking  Tobacco Use: Every Day     Smokeless Tobacco Use: Never     Passive Exposure: Not on file   Transportation Needs: No Transportation Needs (07/20/2022)    PRAPARE - Transportation     Lack of Transportation (Medical): No     Lack of Transportation (Non-Medical): No   Alcohol Use: Not At Risk (03/07/2022)    Alcohol Use     How often do you have a drink containing alcohol?: 2 - 3 times per week     How many drinks containing alcohol do you have on a typical day when you are drinking?: 3 - 4     How often do you have 5 or more drinks on one occasion?: Never   Interpersonal Safety: Not At Risk (07/04/2022)    Interpersonal Safety     Unsafe Where You Currently Live: No     Physically Hurt by Anyone: No     Abused by Anyone: No   Physical Activity: Inactive (07/04/2022)    Exercise Vital Sign     Days of Exercise per Week: 0 days     Minutes of Exercise per Session: 0 min   Intimate Partner Violence: Not At Risk (07/04/2022)    Humiliation, Afraid, Rape, and Kick questionnaire     Fear of Current or Ex-Partner: No     Emotionally Abused: No     Physically Abused: No     Sexually Abused: No   Stress: Stress Concern Present (07/04/2022)    [employer] of Occupational Health - Occupational Stress Questionnaire     Feeling of Stress : To some extent   Substance Use: High Risk (07/04/2022)    Substance Use     Taken prescription drugs for non-medical reasons: Never     Taken illegal drugs: Monthly     Patient indicated they have taken drugs in the past year for non-medical reasons: Yes, [positive answer(s)]: Yes   Social Connections: Moderately Isolated (07/04/2022)    Social Connection and Isolation Panel [NHANES]     Frequency of Communication with Friends and Family: Once a week     Frequency of Social Gatherings with Friends and Family: Once a week     Attends Religious Services: 1 to 4 times per year     Active Member of [employer] or Organizations: Yes     Attends Banker Meetings: Never     Marital Status: Never married   Physicist, medical Strain: Low Risk  (07/04/2022)    Overall Financial Resource Strain (CARDIA)     Difficulty of Paying Living Expenses: Not hard at all   Depression: Not at risk (07/04/2022)    PHQ-2     PHQ-2 Score: 0   Health Literacy: Low Risk  (07/20/2022)    Health Literacy     : Never       Would you be willing to receive help with any of the needs that you have identified today? Not applicable       SHIPPING     Specialty Medication(s) to be Shipped:   Infectious Disease: Biktarvy    Other medication(s) to be shipped:  lisinopril 5mg  and warfarin 5mg      Changes to insurance: No    Delivery Scheduled: Yes, Expected medication delivery date: 10/25/22.     Medication will be delivered via UPS to the confirmed prescription address in [facility].    The patient will receive a drug information handout for each medication shipped and additional FDA Medication  Guides as required.  Verified that patient has previously received a Conservation officer, historic buildings and a Surveyor, mining.    The patient or caregiver noted above participated in the development of this care plan and knows that they can request review of or adjustments to the care plan at any time.      All of the patient's questions and concerns have been addressed.    [name], PharmD   [facility] Specialty Pharmacist

## 2022-10-22 NOTE — Unmapped (Signed)
The PAC has received an incoming clinical call:    Caller name: Mardelle Matte from [place]   Best callback number: [phone]   Relationship to Patient: [facility] nurse    Describe the reason for the call: Per PT [facility] nurse he suggest patient received neurology referral.     Tacy Dura  10/22/22
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
## 2022-10-22 NOTE — Unmapped (Signed)
The PAC has received an incoming clinical call:    Caller name: Patient      Describe the reason for the call: Patient is requesting a referral for Outpatient PT - which is in network with his insurance. Patient states that PCP is aware of this referral     [facility]   Phone [phone]  Fax [phone]

## 2022-10-23 NOTE — Unmapped (Signed)
Order placed

## 2022-10-23 NOTE — Unmapped (Signed)
Addended by: Bobbie Stack on: 10/23/2022 07:33 AM     Modules accepted: Orders

## 2022-10-24 NOTE — Unmapped (Signed)
Called them. Clarified that they were just asking for outpatient PT referral, which has already been placed

## 2022-10-24 NOTE — Unmapped (Signed)
[facility] Nursing Note     TYPE OF ENCOUNTER: Phone and text     REASON: Medication Access and Adherence    ADHERENCE: Pt. Reports being off of his meds for 2 weeks earlier this month.     ASSESSMENT: After review of the patient's chart, RN noticed that the [facility] had not been able to contact the patient in reference to Medication Delivery. RN reached out to the [facility] pharmacist on the patient's behalf, and was able to set up medication delivery for 10/3. Please see the text encounter below:    RN: is it ok if I set up delivery for your medications?Do you know how much you have left?  CT:Yes. About a weeks worth  RN: Have you had trouble remembering to take any of your meds? And be honest with me, Im not here to judge, just want to help.  ZY:SAYT a few days. Also what's the office number? I'm about to sign up for outpatient therapy   RN: Number to Corazon's office: [phone]  Dr.Godfrey: [phone]  CT: Thank you      INTERVENTION: RN coordinated with the [facility] to set up medication delivery. Pt. Is also going to need outpatient occupational therapy and needs a referral. The patient was provided with numbers to the ID clinic and his PCP    PLAN: RN will continue to follow up with the patient     DURATION: 20 min

## 2022-10-29 MED FILL — BIKTARVY 50 MG-200 MG-25 MG TABLET: ORAL | 30 days supply | Qty: 30 | Fill #2

## 2022-10-29 MED FILL — WARFARIN 5 MG TABLET: ORAL | 30 days supply | Qty: 30 | Fill #1

## 2022-11-02 ENCOUNTER — Ambulatory Visit: Payer: 59 | Attending: Internal Medicine | Admitting: Physical Therapy

## 2022-11-02 ENCOUNTER — Other Ambulatory Visit: Payer: Self-pay

## 2022-11-02 DIAGNOSIS — M6281 Muscle weakness (generalized): Secondary | ICD-10-CM | POA: Insufficient documentation

## 2022-11-02 DIAGNOSIS — R2681 Unsteadiness on feet: Secondary | ICD-10-CM | POA: Diagnosis present

## 2022-11-02 DIAGNOSIS — R2689 Other abnormalities of gait and mobility: Secondary | ICD-10-CM | POA: Insufficient documentation

## 2022-11-02 NOTE — Therapy (Signed)
OUTPATIENT PHYSICAL THERAPY NEURO EVALUATION   Patient Name: James Ferrell MRN: [id number] DOB:18-Oct-1975, 47 y.o., male Today's Date: 11/02/2022   PCP: Lucienne Minks provider REFERRING PROVIDER: Idelia Salm, MD  END OF SESSION:  PT End of Session - 11/02/22 1321     Visit Number 1    Number of Visits 16    Date for PT Re-Evaluation 12/28/22    Authorization Type [employer] Medicare    Progress Note Due on Visit 10    PT Start Time 1317    PT Stop Time 1400    PT Time Calculation (min) 43 min    Activity Tolerance Patient tolerated treatment well    Behavior During Therapy [facility] for tasks assessed/performed             Past Medical History:  Diagnosis Date   CHB (complete heart block) (HCC) 2020   Endocarditis of aortic valve    Feb 2020 and April 2021   H/O mechanical aortic valve replacement    HIV (human immunodeficiency virus infection) (HCC)    Stroke [facility])    Past Surgical History:  Procedure Laterality Date   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/21/2019   With root replacement   BENTALL PROCEDURE  07/21/2019   CARDIAC VALVE SURGERY     MECHANICAL AORTIC VALVE REPLACEMENT  07/21/2019   Redo of valve surgery   PACEMAKER IMPLANT     TEE WITHOUT CARDIOVERSION N/A 05/13/2019   Procedure: TRANSESOPHAGEAL ECHOCARDIOGRAM (TEE);  Surgeon: Laurier Nancy, MD;  Location: [facility] ORS;  Service: Cardiovascular;  Laterality: N/A;   TOTAL HIP ARTHROPLASTY Right 08/30/2019   Procedure: TOTAL HIP ARTHROPLASTY ANTERIOR APPROACH;  Surgeon: Tarry Kos, MD;  Location: [facility] OR;  Service: Orthopedics;  Laterality: Right;   Patient Active Problem List   Diagnosis Date Noted   Status post mechanical aortic valve replacement 08/30/2019   Closed displaced fracture of right femoral neck (HCC) 08/30/2019   HIV (human immunodeficiency virus infection) (HCC) 05/20/2019   Bacteremia due to Staphylococcus aureus 05/10/2019   Sepsis (HCC) 05/09/2019   Leukocytosis 05/09/2019   Hypotension  05/09/2019   AKI (acute kidney injury) (HCC) 05/09/2019    ONSET DATE: June 2024  REFERRING DIAG: I63.9 (ICD-10-CM) - CVA (cerebral vascular accident)  THERAPY DIAG:  Muscle weakness  Other abnormalities of gait and mobility  Unsteadiness on feet  Rationale for Evaluation and Treatment: Rehabilitation  SUBJECTIVE:  SUBJECTIVE STATEMENT: Pt reports he had a CVA this past June. Pt states his recovery has been going well. Pt states R LE is still weak. Pt states home health PT ended last week. Pt states balancing is still hard as well as going up/down the stairs. Wants to be able to walk better. Is interested in returning to work if able.  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   Per chart pt admit 6/4-6/12, went to inpatient rehab 6/12-6/28 for his CVA  PAIN:  Are you having pain? No  PRECAUTIONS: None  RED FLAGS: None   WEIGHT BEARING RESTRICTIONS: No  FALLS: Has patient fallen in last 6 months? No  LIVING ENVIRONMENT: Lives with: lives alone Lives in: House/apartment Stairs: Yes: External: 12 steps; bilateral but cannot reach both Has following equipment at home: None  PLOF: Independent  PATIENT GOALS: Improve mobility for stairs and gait quality  OBJECTIVE:  Note: Objective measures were completed at Evaluation unless otherwise noted.  DIAGNOSTIC FINDINGS: CTA Head 06/26/22:  Partially occlusive thrombus of the left distal M1 segment.   COGNITION: Overall cognitive status: Within functional limits for tasks assessed   SENSATION: WFL  COORDINATION: Decreased on R due to strength deficits  EDEMA:  None  MUSCLE TONE: RLE: +1 with knee and hip extension, multibeat myoclonus x 5 sec with R ankle DF  MUSCLE LENGTH: Did not assess  DTRs:  Did not assess  POSTURE: No Significant postural limitations  LOWER EXTREMITY ROM:     Active  Right Eval Left Eval  Hip flexion    Hip extension    Hip abduction    Hip adduction    Hip internal rotation    Hip external rotation    Knee flexion    Knee extension    Ankle dorsiflexion    Ankle plantarflexion    Ankle inversion    Ankle eversion     (Blank rows = not tested)  LOWER EXTREMITY MMT:    MMT Right Eval Left Eval  Hip flexion 5 5  Hip extension 4- 5  Hip abduction 4 4  Hip adduction    Hip internal rotation    Hip external rotation    Knee flexion 3+ 5  Knee extension 4- 5  Ankle dorsiflexion 3+ in seated unable in standing 5  Ankle plantarflexion 4 5  Ankle inversion    Ankle eversion    (Blank rows = not tested)  BED MOBILITY:  Independent  TRANSFERS: Independent  STAIRS: Level of Assistance: Modified independence Stair Negotiation Technique: Step to Pattern with Single Rail on Right Number of Stairs: 4  Height of Stairs: 6"  Comments: R LE circumduction to ascend, wide BOS  GAIT: Gait pattern: Decreased R ankle DF on heel strike (foot slaps) with poor toe off, R hip circumduction during swim with decreased knee extension, decreased R LE stance time, compensatory trunk lean to R during stance phase Distance walked: >200' Assistive device utilized: None Level of assistance: Complete Independence  FUNCTIONAL TESTS:  5x STS 12.97 sec with heavy weightshift to L LE Berg Balance Test: 47/56 DGI: 14/24  [facility] PT Assessment - 11/02/22 0001  Standardized Balance  Assessment   Standardized Balance Assessment Berg Balance Test;Dynamic Gait Index      Berg Balance Test   Sit to Stand Able to stand  independently using hands    Standing Unsupported Able to stand safely 2 minutes    Sitting with Back Unsupported but Feet Supported on Floor or Stool Able to sit safely and securely 2 minutes    Stand to Sit Controls descent by using hands    Transfers Able to transfer safely, minor use of hands    Standing Unsupported with Eyes Closed Able to stand 10 seconds safely    Standing Unsupported with Feet Together Able to place feet together independently and stand 1 minute safely    From Standing, Reach Forward with Outstretched Arm Can reach forward >12 cm safely (5")    From Standing Position, Pick up Object from Floor Able to pick up shoe safely and easily    From Standing Position, Turn to Look Behind Over each Shoulder Looks behind one side only/other side shows less weight shift    Turn 360 Degrees Able to turn 360 degrees safely but slowly   12 sec R&L   Standing Unsupported, Alternately Place Feet on Step/Stool Able to stand independently and safely and complete 8 steps in 20 seconds    Standing Unsupported, One Foot in Front Able to place foot tandem independently and hold 30 seconds    Standing on One Leg Tries to lift leg/unable to hold 3 seconds but remains standing independently   L LE >10 sec, unable on R   Total Score 47      Dynamic Gait Index   Level Surface Moderate Impairment    Change in Gait Speed Moderate Impairment    Gait with Horizontal Head Turns Normal    Gait with Vertical Head Turns Normal    Gait and Pivot Turn Moderate Impairment    Step Over Obstacle Mild Impairment    Step Around Obstacles Mild Impairment    Steps Moderate Impairment    Total Score 14              PATIENT SURVEYS:  FOTO 66; predicted 76  TODAY'S TREATMENT:                                                                                                                               DATE: 11/02/22 See HEP below    PATIENT EDUCATION: Education details: Exam findings, POC, initial HEP Person educated: Patient Education method: Explanation, Demonstration, and Handouts Education comprehension: verbalized understanding, returned demonstration, and needs further education  HOME EXERCISE PROGRAM: Access Code: [email or web address] URL: [email or web address] Date: 11/02/2022 Prepared by: Vernon Prey April Kirstie Peri  Exercises - Supine Active Straight Leg Raise  - 1 x daily - 7 x weekly - 3 sets - 10 reps - Supine Single Leg Ankle Pumps  - 1 x daily - 7 x weekly - 3 sets - 10 reps -  Hooklying Single Knee to Chest Stretch  - 1 x daily - 7 x weekly - 3 sets - 10 reps - Single Leg Bridge  - 1 x daily - 7 x weekly - 3 sets - 10 reps - Sit to Stand with Armchair  - 1 x daily - 7 x weekly - 3 sets - 10 reps  GOALS: Goals reviewed with patient? Yes  SHORT TERM GOALS: Target date: 11/30/2022   Pt will be ind with HEP Baseline: Goal status: INITIAL  2.  Pt will be able to perform ankle DF in standing for heel strike Baseline:  Goal status: INITIAL   LONG TERM GOALS: Target date: 12/28/2022   Pt will be ind with management and progression of HEP Baseline:  Goal status: INITIAL  2.  Pt will be able to demonstrate decreased hip circumduction with improved heel strike with or without R AFO for improved gait stabilty x 1000' for limited community distances Baseline: foot slap/decreased heel strike and toe off, R hip circumduction, decreased knee extension during swing Goal status: INITIAL  3.  Pt will have improved FOTO score to >/=76 Baseline: 66 Goal status: INITIAL  4.  Pt will demo improved Berg Balance to >/=52/56 to be categorized as a low fall risk Baseline:  Goal status: INITIAL  5.  Pt will have improved DGI to >/=20/24 for low fall risk Baseline:  Goal status: INITIAL  6.  Pt will be able to ascend/descend 12 steps  with normal reciprocal pattern without hand holds for improved entry/exit of home Baseline:  Goal status: INITIAL  ASSESSMENT:  CLINICAL IMPRESSION: Patient is a 47 y.o. M who was seen today for physical therapy evaluation and treatment for subacute L CVA in June 2024. Pt has recently ended his home health therapy. Assessment significant for R LE weakness, decreased balance and gait instability affecting safety with home and community tasks limiting participation with return to work. Pt is able to obtain ankle DF in sitting; however, unable to activate these muscles in standing or supine. May require AFO pending on if pt is able to get ankle DF. May trial toe-up brace next session to work on normalizing gait. Pt would highly benefit from PT to address these deficits to improve his level of function and decrease fall risk.   OBJECTIVE IMPAIRMENTS: Abnormal gait, decreased activity tolerance, decreased balance, decreased coordination, decreased endurance, decreased mobility, difficulty walking, decreased strength, and improper body mechanics.   ACTIVITY LIMITATIONS: carrying, lifting, standing, squatting, stairs, transfers, and locomotion level  PARTICIPATION LIMITATIONS: cleaning, laundry, shopping, community activity, and occupation  PERSONAL FACTORS: Fitness, Past/current experiences, and Time since onset of injury/illness/exacerbation are also affecting patient's functional outcome.   REHAB POTENTIAL: Good  CLINICAL DECISION MAKING: Evolving/moderate complexity  EVALUATION COMPLEXITY: Moderate  PLAN:  PT FREQUENCY: 2x/week  PT DURATION: 8 weeks  PLANNED INTERVENTIONS: 97146- PT Re-evaluation, 97110-Therapeutic exercises, 97530- Therapeutic activity, [id number]- Neuromuscular re-education, 97535- Self Care, 62130- Manual therapy, [id number]- Gait training, [phone]- Orthotic Fit/training, [phone]- Aquatic Therapy, [phone]- Ionotophoresis 4mg /ml Dexamethasone, Patient/Family education, Balance training,  Stair training, Taping, Cryotherapy, and Moist heat  PLAN FOR NEXT SESSION: Assess response to HEP. Rockerboard for ankle DF/PF, toe up brace(?) or trial AFOs for gait training, R LE strengthening, balance, and coordination   Kaisha Wachob April Ma L Tryone Kille, PT 11/02/2022, 2:25 PM

## 2022-11-07 ENCOUNTER — Ambulatory Visit: Payer: 59 | Admitting: Physical Therapy

## 2022-11-07 DIAGNOSIS — R2689 Other abnormalities of gait and mobility: Secondary | ICD-10-CM

## 2022-11-07 DIAGNOSIS — R2681 Unsteadiness on feet: Secondary | ICD-10-CM

## 2022-11-07 DIAGNOSIS — M6281 Muscle weakness (generalized): Secondary | ICD-10-CM

## 2022-11-07 NOTE — Therapy (Signed)
OUTPATIENT PHYSICAL THERAPY TREATMENT   Patient Name: James Ferrell MRN: [id number] DOB:[birth year], 47 y.o., male Today's Date: 11/07/2022   PCP: Lucienne Minks provider REFERRING PROVIDER: Idelia Salm, MD  END OF SESSION:  PT End of Session - 11/07/22 1406     Visit Number 2    Number of Visits 16    Date for PT Re-Evaluation 12/28/22    Authorization Type [employer] Medicare    Progress Note Due on Visit 10    PT Start Time 1407    PT Stop Time 1445    PT Time Calculation (min) 38 min    Activity Tolerance Patient tolerated treatment well    Behavior During Therapy [facility] for tasks assessed/performed              Past Medical History:  Diagnosis Date   CHB (complete heart block) (HCC) 2020   Endocarditis of aortic valve    Feb 2020 and April 2021   H/O mechanical aortic valve replacement    HIV (human immunodeficiency virus infection) (HCC)    Stroke [facility])    Past Surgical History:  Procedure Laterality Date   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/21/2019   With root replacement   BENTALL PROCEDURE  07/21/2019   CARDIAC VALVE SURGERY     MECHANICAL AORTIC VALVE REPLACEMENT  07/21/2019   Redo of valve surgery   PACEMAKER IMPLANT     TEE WITHOUT CARDIOVERSION N/A 05/13/2019   Procedure: TRANSESOPHAGEAL ECHOCARDIOGRAM (TEE);  Surgeon: Laurier Nancy, MD;  Location: [facility] ORS;  Service: Cardiovascular;  Laterality: N/A;   TOTAL HIP ARTHROPLASTY Right 08/30/2019   Procedure: TOTAL HIP ARTHROPLASTY ANTERIOR APPROACH;  Surgeon: Tarry Kos, MD;  Location: [facility] OR;  Service: Orthopedics;  Laterality: Right;   Patient Active Problem List   Diagnosis Date Noted   Status post mechanical aortic valve replacement 08/30/2019   Closed displaced fracture of right femoral neck (HCC) 08/30/2019   HIV (human immunodeficiency virus infection) (HCC) 05/20/2019   Bacteremia due to Staphylococcus aureus 05/10/2019   Sepsis (HCC) 05/09/2019   Leukocytosis 05/09/2019   Hypotension  05/09/2019   AKI (acute kidney injury) (HCC) 05/09/2019    ONSET DATE: June 2024  REFERRING DIAG: I63.9 (ICD-10-CM) - CVA (cerebral vascular accident)  THERAPY DIAG:  No diagnosis found.  Rationale for Evaluation and Treatment: Rehabilitation  SUBJECTIVE:                                                                                                                                                                                             SUBJECTIVE STATEMENT: Pt reports nothing new or different. Pt  states he's been working on his exercises. No soreness.   From eval: Pt reports he had a CVA this past June. Pt states his recovery has been going well. Pt states R LE is still weak. Pt states home health PT ended last week. Pt states balancing is still hard as well as going up/down the stairs. Wants to be able to walk better. Is interested in returning to work if able.  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   Per chart pt admit 6/4-6/12, went to inpatient rehab 6/12-6/28 for his CVA  PAIN:  Are you having pain? No  PRECAUTIONS: None  RED FLAGS: None   WEIGHT BEARING RESTRICTIONS: No  FALLS: Has patient fallen in last 6 months? No  LIVING ENVIRONMENT: Lives with: lives alone Lives in: House/apartment Stairs: Yes: External: 12 steps; bilateral but cannot reach both Has following equipment at home: None  PLOF: Independent  PATIENT GOALS: Improve mobility for stairs and gait quality  OBJECTIVE:  Note: Objective measures were completed at Evaluation unless otherwise  noted.  DIAGNOSTIC FINDINGS: CTA Head 06/26/22: Partially occlusive thrombus of the left distal M1 segment.   COGNITION: Overall cognitive status: Within functional limits for tasks assessed   SENSATION: WFL  COORDINATION: Decreased on R due to strength deficits  EDEMA:  None  MUSCLE TONE: RLE: +1 with knee and hip extension, multibeat myoclonus x 5 sec with R ankle DF  MUSCLE LENGTH: Did not assess  DTRs:  Did not assess  POSTURE: No Significant postural limitations  LOWER EXTREMITY ROM:     Active  Right Eval Left Eval  Hip flexion    Hip extension    Hip abduction    Hip adduction    Hip internal rotation    Hip external rotation    Knee flexion    Knee extension    Ankle dorsiflexion    Ankle plantarflexion    Ankle inversion    Ankle eversion     (Blank rows = not tested)  LOWER EXTREMITY MMT:    MMT Right Eval Left Eval  Hip flexion 5 5  Hip extension 4- 5  Hip abduction 4 4  Hip adduction    Hip internal rotation    Hip external rotation    Knee flexion 3+ 5  Knee extension 4- 5  Ankle dorsiflexion 3+ in seated unable in standing 5  Ankle plantarflexion 4 5  Ankle inversion    Ankle eversion    (Blank rows = not tested)  BED MOBILITY:  Independent  TRANSFERS: Independent  STAIRS: Level of Assistance: Modified independence Stair Negotiation Technique: Step to Pattern with Single Rail on Right Number of Stairs: 4  Height of Stairs: 6"  Comments: R LE circumduction to ascend, wide BOS  GAIT: Gait pattern: Decreased R ankle DF on heel strike (foot slaps) with poor toe off, R hip circumduction during swim with decreased knee extension, decreased R LE stance time, compensatory trunk lean to R during stance phase Distance walked: >200' Assistive device utilized: None Level of assistance: Complete Independence  FUNCTIONAL TESTS:  5x STS 12.97 sec with heavy weightshift to L LE Berg Balance Test:  47/56 DGI: 14/24     PATIENT  SURVEYS:  FOTO 66; predicted 76  TODAY'S TREATMENT:                                                                                                                              DATE:  11/07/22 Sci fit L3 x 5 min LEs only Supine  SLR x10, with red TB 2x10  Single leg bridge 2x10  Bridge with walk out x10  Hip flexion eccentrics 10x3" Standing  Gastroc stretch x30"  Soleus stretch x30"  Rockerboard A/P, for ankle PF/DF x10 double leg, 2x10 single leg on R  Heel strike with R foot 2x10  Ambulating in // bars with focus on heel/toe pattern, knee flexion during swing phase, and decreasing hip hike during swing  Trialed foot up brace x10' so pt does not have to focus as hard on keeping ankle DF    PATIENT EDUCATION: Education details: Exam findings, POC, initial HEP Person educated: Patient Education method: Explanation, Demonstration, and Handouts Education comprehension: verbalized understanding, returned demonstration, and needs further education  HOME EXERCISE PROGRAM: Access Code: [email or web address] URL: [email or web address] Date: 11/02/2022 Prepared by: Vernon Prey April Kirstie Peri  Exercises - Supine Active Straight Leg Raise  - 1 x daily - 7 x weekly - 3 sets - 10 reps - Supine Single Leg Ankle Pumps  - 1 x daily - 7 x weekly - 3 sets - 10 reps - Hooklying Single Knee to Chest Stretch  - 1 x daily - 7 x weekly - 3 sets - 10 reps - Single Leg Bridge  - 1 x daily - 7 x weekly - 3 sets - 10 reps - Sit to Stand with Armchair  - 1 x daily - 7 x weekly - 3 sets - 10 reps  GOALS: Goals reviewed with patient? Yes  SHORT TERM GOALS: Target date: 11/30/2022   Pt will be ind with HEP Baseline: Goal status: INITIAL  2.  Pt will be able to perform ankle DF in standing for heel strike Baseline:  Goal status: INITIAL   LONG TERM GOALS: Target date: 12/28/2022   Pt will be ind with management and progression of HEP Baseline:  Goal status: INITIAL  2.  Pt will be able  to demonstrate decreased hip circumduction with improved heel strike with or without R AFO for improved gait stabilty x 1000' for limited community distances Baseline: foot slap/decreased heel strike and toe off, R hip circumduction, decreased knee extension during swing Goal status: INITIAL  3.  Pt will have improved FOTO score to >/=76 Baseline: 66 Goal status: INITIAL  4.  Pt will demo improved Berg Balance to >/=52/56 to be categorized as a low fall risk Baseline:  Goal status: INITIAL  5.  Pt will have improved DGI to >/=20/24 for low fall risk Baseline:  Goal status: INITIAL  6.  Pt will be able to ascend/descend 12 steps with normal reciprocal pattern without  hand holds for improved entry/exit of home Baseline:  Goal status: INITIAL  ASSESSMENT:  CLINICAL IMPRESSION: Pt able to demonstrate increased ankle dorsiflexion on R LE in standing today. Able to obtain a very good heel/toe pattern with UE support in // bars. Focused on decreasing hip hiking during swing phase of gait. Reviewed and updated HEP.   From eval: Patient is a 47 y.o. M who was seen today for physical therapy evaluation and treatment for subacute L CVA in June 2024. Pt has recently ended his home health therapy. Assessment significant for R LE weakness, decreased balance and gait instability affecting safety with home and community tasks limiting participation with return to work. Pt is able to obtain ankle DF in sitting; however, unable to activate these muscles in standing or supine. May require AFO pending on if pt is able to get ankle DF. May trial toe-up brace next session to work on normalizing gait. Pt would highly benefit from PT to address these deficits to improve his level of function and decrease fall risk.   OBJECTIVE IMPAIRMENTS: Abnormal gait, decreased activity tolerance, decreased balance, decreased coordination, decreased endurance, decreased mobility, difficulty walking, decreased strength, and  improper body mechanics.   PLAN:  PT FREQUENCY: 2x/week  PT DURATION: 8 weeks  PLANNED INTERVENTIONS: 97146- PT Re-evaluation, 97110-Therapeutic exercises, 97530- Therapeutic activity, [id number]- Neuromuscular re-education, 97535- Self Care, 14782- Manual therapy, [phone]- Gait training, [phone]- Orthotic Fit/training, [phone]- Aquatic Therapy, [phone]- Ionotophoresis 4mg /ml Dexamethasone, Patient/Family education, Balance training, Stair training, Taping, Cryotherapy, and Moist heat  PLAN FOR NEXT SESSION: Assess response to HEP. Rockerboard for ankle DF/PF, toe up brace(?) or trial AFOs for gait training, R LE strengthening, balance, and coordination   Dayrin Stallone April Ma L Gregori Abril, PT 11/07/2022, 2:08 PM

## 2022-11-09 ENCOUNTER — Ambulatory Visit: Payer: 59 | Admitting: Physical Therapy

## 2022-11-09 DIAGNOSIS — R2681 Unsteadiness on feet: Secondary | ICD-10-CM

## 2022-11-09 DIAGNOSIS — R2689 Other abnormalities of gait and mobility: Secondary | ICD-10-CM

## 2022-11-09 DIAGNOSIS — M6281 Muscle weakness (generalized): Secondary | ICD-10-CM | POA: Diagnosis not present

## 2022-11-09 NOTE — Therapy (Signed)
OUTPATIENT PHYSICAL THERAPY TREATMENT   Patient Name: James Ferrell MRN: [id number] DOB:10/03/1975, 47 y.o., male Today's Date: 11/09/2022   PCP: Lucienne Minks provider REFERRING PROVIDER: Idelia Salm, MD  END OF SESSION:     Past Medical History:  Diagnosis Date   CHB (complete heart block) (HCC) 2020   Endocarditis of aortic valve    Feb 2020 and April 2021   H/O mechanical aortic valve replacement    HIV (human immunodeficiency virus infection) (HCC)    Stroke [facility])    Past Surgical History:  Procedure Laterality Date   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/21/2019   With root replacement   BENTALL PROCEDURE  07/21/2019   CARDIAC VALVE SURGERY     MECHANICAL AORTIC VALVE REPLACEMENT  07/21/2019   Redo of valve surgery   PACEMAKER IMPLANT     TEE WITHOUT CARDIOVERSION N/A 05/13/2019   Procedure: TRANSESOPHAGEAL ECHOCARDIOGRAM (TEE);  Surgeon: Laurier Nancy, MD;  Location: [facility] ORS;  Service: Cardiovascular;  Laterality: N/A;   TOTAL HIP ARTHROPLASTY Right 08/30/2019   Procedure: TOTAL HIP ARTHROPLASTY ANTERIOR APPROACH;  Surgeon: Tarry Kos, MD;  Location: [facility] OR;  Service: Orthopedics;  Laterality: Right;   Patient Active Problem List   Diagnosis Date Noted   Status post mechanical aortic valve replacement 08/30/2019   Closed displaced fracture of right femoral neck (HCC) 08/30/2019   HIV (human immunodeficiency virus infection) (HCC) 05/20/2019   Bacteremia due to Staphylococcus aureus 05/10/2019   Sepsis (HCC) 05/09/2019   Leukocytosis 05/09/2019   Hypotension 05/09/2019   AKI (acute kidney injury) (HCC) 05/09/2019    ONSET DATE: June 2024  REFERRING DIAG: I63.9 (ICD-10-CM) - CVA (cerebral vascular accident)  THERAPY DIAG:  No diagnosis found.  Rationale for Evaluation and Treatment: Rehabilitation  SUBJECTIVE:                                                                                                                                                                                              SUBJECTIVE STATEMENT: Pt reports nothing new or different. A little sore after last session.  From eval: Pt reports he had a CVA this past June. Pt states his recovery has been going well. Pt states R LE is still weak. Pt states home health PT ended last week. Pt states balancing is still hard as well as going up/down the stairs. Wants to be able to walk better. Is interested in returning to work if able.  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   Per chart pt admit 6/4-6/12, went to inpatient rehab 6/12-6/28 for his CVA  PAIN:  Are you having pain? No  PRECAUTIONS: None  RED FLAGS: None   WEIGHT BEARING RESTRICTIONS: No  FALLS: Has patient fallen in last 6 months? No  LIVING ENVIRONMENT: Lives with: lives alone Lives in: House/apartment Stairs: Yes: External: 12 steps; bilateral but cannot reach both Has following equipment at home: None  PLOF: Independent  PATIENT GOALS: Improve mobility for stairs and gait quality  OBJECTIVE:  Note: Objective measures were completed at Evaluation unless otherwise noted.  DIAGNOSTIC FINDINGS: CTA Head 06/26/22: Partially occlusive thrombus of the left distal M1 segment.   COGNITION: Overall cognitive status: Within functional limits for tasks assessed   SENSATION: WFL  COORDINATION: Decreased on R due to strength deficits  EDEMA:  None  MUSCLE TONE: RLE: +1 with knee and hip extension, multibeat myoclonus x 5 sec with R ankle DF  MUSCLE LENGTH: Did not assess  DTRs:  Did  not assess  POSTURE: No Significant postural limitations  LOWER EXTREMITY ROM:     Active  Right Eval Left Eval  Hip flexion    Hip extension    Hip abduction    Hip adduction    Hip internal rotation    Hip external rotation    Knee flexion    Knee extension    Ankle dorsiflexion    Ankle plantarflexion    Ankle inversion    Ankle eversion     (Blank rows = not tested)  LOWER EXTREMITY MMT:    MMT Right Eval Left Eval  Hip flexion 5 5  Hip extension 4- 5  Hip abduction 4 4  Hip adduction    Hip internal rotation    Hip external rotation    Knee flexion 3+ 5  Knee extension 4- 5  Ankle dorsiflexion 3+ in seated unable in standing 5  Ankle plantarflexion 4 5  Ankle inversion    Ankle eversion    (Blank rows = not tested)  BED MOBILITY:  Independent  TRANSFERS: Independent  STAIRS: Level of Assistance: Modified independence Stair Negotiation Technique: Step to Pattern with Single Rail on Right Number of Stairs: 4  Height of Stairs: 6"  Comments: R LE circumduction to ascend, wide BOS  GAIT: Gait pattern: Decreased R ankle DF on heel strike (foot slaps) with poor toe off, R hip circumduction during swim with decreased knee extension, decreased R LE stance time, compensatory trunk lean to R during stance phase Distance walked: >200' Assistive device utilized: None Level of assistance: Complete Independence  FUNCTIONAL TESTS:  5x STS 12.97 sec with heavy weightshift to L LE Berg Balance Test: 47/56 DGI: 14/24     PATIENT SURVEYS:  FOTO 66; predicted 76  TODAY'S TREATMENT:  DATE:  11/09/22 Standing  Gastroc stretch with chair 2x30"  Heel/toe raise x20 Supine  Bridge walk out 2x10  Gastroc stretch with strap x30" In // bars  Rockerboard L<>R working on alternating knee flex/ext for weight shifting 2x10  Heel strike step  forward/backward 2x10  Ambulating with foot up brace, focus on heel/toe pattern, knee flexion during swing phase, and decreasing hip hike during swing (performed in and out of // bars)    PATIENT EDUCATION: Education details: Exam findings, POC, initial HEP Person educated: Patient Education method: Explanation, Demonstration, and Handouts Education comprehension: verbalized understanding, returned demonstration, and needs further education  HOME EXERCISE PROGRAM: Access Code: [email or web address] URL: [email or web address] Date: 11/02/2022 Prepared by: Vernon Prey April Kirstie Peri  Exercises - Supine Active Straight Leg Raise  - 1 x daily - 7 x weekly - 3 sets - 10 reps - Supine Single Leg Ankle Pumps  - 1 x daily - 7 x weekly - 3 sets - 10 reps - Hooklying Single Knee to Chest Stretch  - 1 x daily - 7 x weekly - 3 sets - 10 reps - Single Leg Bridge  - 1 x daily - 7 x weekly - 3 sets - 10 reps - Sit to Stand with Armchair  - 1 x daily - 7 x weekly - 3 sets - 10 reps  GOALS: Goals reviewed with patient? Yes  SHORT TERM GOALS: Target date: 11/30/2022   Pt will be ind with HEP Baseline: Goal status: INITIAL  2.  Pt will be able to perform ankle DF in standing for heel strike Baseline:  Goal status: INITIAL   LONG TERM GOALS: Target date: 12/28/2022   Pt will be ind with management and progression of HEP Baseline:  Goal status: INITIAL  2.  Pt will be able to demonstrate decreased hip circumduction with improved heel strike with or without R AFO for improved gait stabilty x 1000' for limited community distances Baseline: foot slap/decreased heel strike and toe off, R hip circumduction, decreased knee extension during swing Goal status: INITIAL  3.  Pt will have improved FOTO score to >/=76 Baseline: 66 Goal status: INITIAL  4.  Pt will demo improved Berg Balance to >/=52/56 to be categorized as a low fall risk Baseline:  Goal status: INITIAL  5.  Pt will have  improved DGI to >/=20/24 for low fall risk Baseline:  Goal status: INITIAL  6.  Pt will be able to ascend/descend 12 steps with normal reciprocal pattern without hand holds for improved entry/exit of home Baseline:  Goal status: INITIAL  ASSESSMENT:  CLINICAL IMPRESSION: Continued improvements noted in strength and endurance. Improving coordination during bridge with walk outs with more even step lengths and less hip ER. Working on gait with foot up brace today to decrease foot drag to work on proper walking Geologist, engineering. Pt is able to demo improving ankle DF in standing position.   From eval: Patient is a 47 y.o. M who was seen today for physical therapy evaluation and treatment for subacute L CVA in June 2024. Pt has recently ended his home health therapy. Assessment significant for R LE weakness, decreased balance and gait instability affecting safety with home and community tasks limiting participation with return to work. Pt is able to obtain ankle DF in sitting; however, unable to activate these muscles in standing or supine. May require AFO pending on if pt is able to get ankle DF. May trial toe-up brace next session to work on normalizing gait. Pt  would highly benefit from PT to address these deficits to improve his level of function and decrease fall risk.   OBJECTIVE IMPAIRMENTS: Abnormal gait, decreased activity tolerance, decreased balance, decreased coordination, decreased endurance, decreased mobility, difficulty walking, decreased strength, and improper body mechanics.   PLAN:  PT FREQUENCY: 2x/week  PT DURATION: 8 weeks  PLANNED INTERVENTIONS: 97146- PT Re-evaluation, 97110-Therapeutic exercises, 97530- Therapeutic activity, [id number]- Neuromuscular re-education, 97535- Self Care, 95621- Manual therapy, [phone]- Gait training, [phone]- Orthotic Fit/training, [phone]- Aquatic Therapy, [phone]- Ionotophoresis 4mg /ml Dexamethasone, Patient/Family education, Balance training, Stair training,  Taping, Cryotherapy, and Moist heat  PLAN FOR NEXT SESSION: Assess response to HEP. Rockerboard for ankle DF/PF, toe up brace(?) or trial AFOs for gait training, R LE strengthening, balance, and coordination   Pheonix Wisby April Ma L Michaelpaul Apo, PT 11/09/2022, 3:32 PM

## 2022-11-13 ENCOUNTER — Ambulatory Visit: Admit: 2022-11-13 | Payer: MEDICARE | Attending: Family | Primary: Family

## 2022-11-14 ENCOUNTER — Encounter: Payer: Self-pay | Admitting: Physical Therapy

## 2022-11-14 ENCOUNTER — Ambulatory Visit: Payer: 59 | Admitting: Physical Therapy

## 2022-11-14 DIAGNOSIS — R2681 Unsteadiness on feet: Secondary | ICD-10-CM

## 2022-11-14 DIAGNOSIS — M6281 Muscle weakness (generalized): Secondary | ICD-10-CM

## 2022-11-14 DIAGNOSIS — R2689 Other abnormalities of gait and mobility: Secondary | ICD-10-CM

## 2022-11-14 NOTE — Therapy (Signed)
OUTPATIENT PHYSICAL THERAPY TREATMENT   Patient Name: Katlin Son MRN: [id number] DOB:[birth year], 47 y.o., male Today's Date: 11/14/2022   PCP: Lucienne Minks provider REFERRING PROVIDER: Idelia Salm, MD  END OF SESSION:  PT End of Session - 11/14/22 1359     Visit Number 4    Number of Visits 16    Date for PT Re-Evaluation 12/28/22    Authorization Type [employer]    Progress Note Due on Visit 10    PT Start Time 1400    PT Stop Time 1440    PT Time Calculation (min) 40 min    Activity Tolerance Patient tolerated treatment well    Behavior During Therapy [facility] for tasks assessed/performed               Past Medical History:  Diagnosis Date   CHB (complete heart block) (HCC) 2020   Endocarditis of aortic valve    Feb 2020 and April 2021   H/O mechanical aortic valve replacement    HIV (human immunodeficiency virus infection) (HCC)    Stroke [facility])    Past Surgical History:  Procedure Laterality Date   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/21/2019   With root replacement   BENTALL PROCEDURE  07/21/2019   CARDIAC VALVE SURGERY     MECHANICAL AORTIC VALVE REPLACEMENT  07/21/2019   Redo of valve surgery   PACEMAKER IMPLANT     TEE WITHOUT CARDIOVERSION N/A 05/13/2019   Procedure: TRANSESOPHAGEAL ECHOCARDIOGRAM (TEE);  Surgeon: Laurier Nancy, MD;  Location: [facility] ORS;  Service: Cardiovascular;  Laterality: N/A;   TOTAL HIP ARTHROPLASTY Right 08/30/2019   Procedure: TOTAL HIP ARTHROPLASTY ANTERIOR APPROACH;  Surgeon: Tarry Kos, MD;  Location: [facility] OR;  Service: Orthopedics;  Laterality: Right;   Patient Active Problem List   Diagnosis Date Noted   Status post mechanical aortic valve replacement 08/30/2019   Closed displaced fracture of right femoral neck (HCC) 08/30/2019   HIV (human immunodeficiency virus infection) (HCC) 05/20/2019   Bacteremia due to Staphylococcus aureus 05/10/2019   Sepsis (HCC) 05/09/2019   Leukocytosis 05/09/2019   Hypotension  05/09/2019   AKI (acute kidney injury) (HCC) 05/09/2019    ONSET DATE: June 2024  REFERRING DIAG: I63.9 (ICD-10-CM) - CVA (cerebral vascular accident)  THERAPY DIAG:  Muscle weakness  Other abnormalities of gait and mobility  Unsteadiness on feet  Rationale for Evaluation and Treatment: Rehabilitation  SUBJECTIVE:  SUBJECTIVE STATEMENT: Pt reports nothing new or different. Has not been able to practice all of his exercises.   From eval: Pt reports he had a CVA this past June. Pt states his recovery has been going well. Pt states R LE is still weak. Pt states home health PT ended last week. Pt states balancing is still hard as well as going up/down the stairs. Wants to be able to walk better. Is interested in returning to work if able.  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   Per chart pt admit 6/4-6/12, went to inpatient rehab 6/12-6/28 for his CVA  PAIN:  Are you having pain? No  PRECAUTIONS: None  RED FLAGS: None   WEIGHT BEARING RESTRICTIONS: No  FALLS: Has patient fallen in last 6 months? No  LIVING ENVIRONMENT: Lives with: lives alone Lives in: House/apartment Stairs: Yes: External: 12 steps; bilateral but cannot reach both Has following equipment at home: None  PLOF: Independent  PATIENT GOALS: Improve mobility for stairs and gait quality  OBJECTIVE:  Note: Objective  measures were completed at Evaluation unless otherwise noted.  DIAGNOSTIC FINDINGS: CTA Head 06/26/22: Partially occlusive thrombus of the left distal M1 segment.   COGNITION: Overall cognitive status: Within functional limits for tasks assessed   SENSATION: WFL  COORDINATION: Decreased on R due to strength deficits  EDEMA:  None  MUSCLE TONE: RLE: +1 with knee and hip extension, multibeat myoclonus x 5 sec with R ankle DF  MUSCLE LENGTH: Did not assess  DTRs:  Did not assess  POSTURE: No Significant postural limitations  LOWER EXTREMITY ROM:     Active  Right Eval Left Eval  Hip flexion    Hip extension    Hip abduction    Hip adduction    Hip internal rotation    Hip external rotation    Knee flexion    Knee extension    Ankle dorsiflexion    Ankle plantarflexion    Ankle inversion    Ankle eversion     (Blank rows = not tested)  LOWER EXTREMITY MMT:    MMT Right Eval Left Eval  Hip flexion 5 5  Hip extension 4- 5  Hip abduction 4 4  Hip adduction    Hip internal rotation    Hip external rotation    Knee flexion 3+ 5  Knee extension 4- 5  Ankle dorsiflexion 3+ in seated unable in standing 5  Ankle plantarflexion 4 5  Ankle inversion    Ankle eversion    (Blank rows = not tested)  BED MOBILITY:  Independent  TRANSFERS: Independent  STAIRS: Level of Assistance: Modified independence Stair Negotiation Technique: Step to Pattern with Single Rail on Right Number of Stairs: 4  Height of Stairs: 6"  Comments: R LE circumduction to ascend, wide BOS  GAIT: Gait pattern: Decreased R ankle DF on heel strike (foot slaps) with poor toe off, R hip circumduction during swim with decreased knee extension, decreased R LE stance time, compensatory trunk lean to R during stance phase Distance walked: >200' Assistive device utilized: None Level of assistance: Complete Independence  FUNCTIONAL TESTS:  5x STS 12.97 sec  with heavy weightshift to L LE Berg  Balance Test: 47/56 DGI: 14/24     PATIENT SURVEYS:  FOTO 66; predicted 76  TODAY'S TREATMENT:                                                                                                                              DATE:  11/14/22 Standing  Gastroc stretch with chair x30"  Soleus stretch with chair x30"  Elliptical fwd x2 min, bwd x 2 min focus on decreasing hip hiking on R  Heel/toe raise x20, staggered stance hands on wall x10  Knee flexion into Heel strike 2x10 on R Supine  Bridge walk out 2x10  SLR 2x10 Gait training x 5 laps around gym with foot up brace on R. Working on heel strike, knee flexion with toe off, with decreased R hip hike and increasing L step length. Min A to keep R LE from externally rotating during toe off.      PATIENT EDUCATION: Education details: Exam findings, POC, initial HEP Person educated: Patient Education method: Explanation, Demonstration, and Handouts Education comprehension: verbalized understanding, returned demonstration, and needs further education  HOME EXERCISE PROGRAM: Access Code: [email or web address] URL: [email or web address] Date: 11/02/2022 Prepared by: Vernon Prey April Kirstie Peri  Exercises - Supine Active Straight Leg Raise  - 1 x daily - 7 x weekly - 3 sets - 10 reps - Supine Single Leg Ankle Pumps  - 1 x daily - 7 x weekly - 3 sets - 10 reps - Hooklying Single Knee to Chest Stretch  - 1 x daily - 7 x weekly - 3 sets - 10 reps - Single Leg Bridge  - 1 x daily - 7 x weekly - 3 sets - 10 reps - Sit to Stand with Armchair  - 1 x daily - 7 x weekly - 3 sets - 10 reps  GOALS: Goals reviewed with patient? Yes  SHORT TERM GOALS: Target date: 11/30/2022   Pt will be ind with HEP Baseline: Goal status: IN PROGRESS  2.  Pt will be able to perform ankle DF in standing for heel strike Baseline:  Goal status: MET 11/14/22    LONG TERM GOALS: Target date: 12/28/2022   Pt will be ind with management and progression of  HEP Baseline:  Goal status: INITIAL  2.  Pt will be able to demonstrate decreased hip circumduction with improved heel strike with or without R AFO for improved gait stabilty x 1000' for limited community distances Baseline: foot slap/decreased heel strike and toe off, R hip circumduction, decreased knee extension during swing Goal status: INITIAL  3.  Pt will have improved FOTO score to >/=76 Baseline: 66 Goal status: INITIAL  4.  Pt will demo improved Berg Balance to >/=52/56 to be categorized as a low fall risk Baseline:  Goal status: INITIAL  5.  Pt will have improved DGI to >/=20/24 for low fall risk Baseline:  Goal status: INITIAL  6.  Pt will be able to ascend/descend 12 steps with normal reciprocal pattern without hand holds for improved entry/exit of home Baseline:  Goal status: INITIAL  ASSESSMENT:  CLINICAL IMPRESSION: Progressed pt's ankle strengthening exercises to include more weight on R LE for toe off and heel strike. Able to tolerate elliptical well today with cueing to decrease hip hike. Continued to work on Animator with focus on decrease hip hiking/circumduction. This continues to improve but pt still requires cueing.   From eval: Patient is a 47 y.o. M who was seen today for physical therapy evaluation and treatment for subacute L CVA in June 2024. Pt has recently ended his home health therapy. Assessment significant for R LE weakness, decreased balance and gait instability affecting safety with home and community tasks limiting participation with return to work. Pt is able to obtain ankle DF in sitting; however, unable to activate these muscles in standing or supine. May require AFO pending on if pt is able to get ankle DF. May trial toe-up brace next session to work on normalizing gait. Pt would highly benefit from PT to address these deficits to improve his level of function and decrease fall risk.   OBJECTIVE IMPAIRMENTS: Abnormal gait, decreased activity  tolerance, decreased balance, decreased coordination, decreased endurance, decreased mobility, difficulty walking, decreased strength, and improper body mechanics.   PLAN:  PT FREQUENCY: 2x/week  PT DURATION: 8 weeks  PLANNED INTERVENTIONS: 97146- PT Re-evaluation, 97110-Therapeutic exercises, 97530- Therapeutic activity, [id number]- Neuromuscular re-education, 97535- Self Care, 60454- Manual therapy, [phone]- Gait training, [phone]- Orthotic Fit/training, [phone]- Aquatic Therapy, [phone]- Ionotophoresis 4mg /ml Dexamethasone, Patient/Family education, Balance training, Stair training, Taping, Cryotherapy, and Moist heat  PLAN FOR NEXT SESSION: Assess response to HEP. Initiate lunges, stairs/steps, and eccentric quad control. Continue ankle strengthening. R LE strengthening, balance, and coordination   Delitha Elms April Ma L Margretta Zamorano, PT 11/14/2022, 1:59 PM

## 2022-11-16 ENCOUNTER — Ambulatory Visit: Payer: 59 | Admitting: Physical Therapy

## 2022-11-16 DIAGNOSIS — M6281 Muscle weakness (generalized): Secondary | ICD-10-CM

## 2022-11-16 DIAGNOSIS — R2681 Unsteadiness on feet: Secondary | ICD-10-CM

## 2022-11-16 DIAGNOSIS — R2689 Other abnormalities of gait and mobility: Secondary | ICD-10-CM

## 2022-11-16 NOTE — Therapy (Signed)
OUTPATIENT PHYSICAL THERAPY TREATMENT   Patient Name: James Ferrell MRN: [id number] DOB:1975/09/19, 47 y.o., male Today's Date: 11/16/2022   PCP: Lucienne Minks provider REFERRING PROVIDER: Idelia Salm, MD  END OF SESSION:  PT End of Session - 11/16/22 1359     Visit Number 5    Number of Visits 16    Date for PT Re-Evaluation 12/28/22    Authorization Type [employer] Medicare    Progress Note Due on Visit 10    PT Start Time 1400    PT Stop Time 1440    PT Time Calculation (min) 40 min    Activity Tolerance Patient tolerated treatment well    Behavior During Therapy [facility] for tasks assessed/performed               Past Medical History:  Diagnosis Date   CHB (complete heart block) (HCC) 2020   Endocarditis of aortic valve    Feb 2020 and April 2021   H/O mechanical aortic valve replacement    HIV (human immunodeficiency virus infection) (HCC)    Stroke [facility])    Past Surgical History:  Procedure Laterality Date   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/21/2019   With root replacement   BENTALL PROCEDURE  07/21/2019   CARDIAC VALVE SURGERY     MECHANICAL AORTIC VALVE REPLACEMENT  07/21/2019   Redo of valve surgery   PACEMAKER IMPLANT     TEE WITHOUT CARDIOVERSION N/A 05/13/2019   Procedure: TRANSESOPHAGEAL ECHOCARDIOGRAM (TEE);  Surgeon: Laurier Nancy, MD;  Location: [facility] ORS;  Service: Cardiovascular;  Laterality: N/A;   TOTAL HIP ARTHROPLASTY Right 08/30/2019   Procedure: TOTAL HIP ARTHROPLASTY ANTERIOR APPROACH;  Surgeon: Tarry Kos, MD;  Location: [facility] OR;  Service: Orthopedics;  Laterality: Right;   Patient Active Problem List   Diagnosis Date Noted   Status post mechanical aortic valve replacement 08/30/2019   Closed displaced fracture of right femoral neck (HCC) 08/30/2019   HIV (human immunodeficiency virus infection) (HCC) 05/20/2019   Bacteremia due to Staphylococcus aureus 05/10/2019   Sepsis (HCC) 05/09/2019   Leukocytosis 05/09/2019   Hypotension  05/09/2019   AKI (acute kidney injury) (HCC) 05/09/2019    ONSET DATE: June 2024  REFERRING DIAG: I63.9 (ICD-10-CM) - CVA (cerebral vascular accident)  THERAPY DIAG:  No diagnosis found.  Rationale for Evaluation and Treatment: Rehabilitation  SUBJECTIVE:                                                                                                                                                                                             SUBJECTIVE STATEMENT: Pt reports nothing new or different.  Brought a new pair of shoes with better laces.   From eval: Pt reports he had a CVA this past June. Pt states his recovery has been going well. Pt states R LE is still weak. Pt states home health PT ended last week. Pt states balancing is still hard as well as going up/down the stairs. Wants to be able to walk better. Is interested in returning to work if able.  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   Per chart pt admit 6/4-6/12, went to inpatient rehab 6/12-6/28 for his CVA  PAIN:  Are you having pain? No  PRECAUTIONS: None  RED FLAGS: None   WEIGHT BEARING RESTRICTIONS: No  FALLS: Has patient fallen in last 6 months? No  LIVING ENVIRONMENT: Lives with: lives alone Lives in: House/apartment Stairs: Yes: External: 12 steps; bilateral but cannot reach both Has following equipment at home: None  PLOF: Independent  PATIENT GOALS: Improve mobility for stairs and gait quality  OBJECTIVE:  Note: Objective measures were completed at Evaluation unless otherwise  noted.  DIAGNOSTIC FINDINGS: CTA Head 06/26/22: Partially occlusive thrombus of the left distal M1 segment.   COORDINATION: Decreased on R due to strength deficits  MUSCLE TONE: RLE: +1 with knee and hip extension, multibeat myoclonus x 5 sec with R ankle DF  LOWER EXTREMITY ROM:     Active  Right Eval Left Eval  Hip flexion    Hip extension    Hip abduction    Hip adduction    Hip internal rotation    Hip external rotation    Knee flexion    Knee extension    Ankle dorsiflexion    Ankle plantarflexion    Ankle inversion    Ankle eversion     (Blank rows = not tested)  LOWER EXTREMITY MMT:    MMT Right Eval Left Eval  Hip flexion 5 5  Hip extension 4- 5  Hip abduction 4 4  Hip adduction    Hip internal rotation    Hip external rotation    Knee flexion 3+ 5  Knee extension 4- 5  Ankle dorsiflexion 3+ in seated unable in standing 5  Ankle plantarflexion 4 5  Ankle inversion    Ankle eversion    (Blank rows = not tested)  BED MOBILITY:  Independent  TRANSFERS: Independent  STAIRS: Level of Assistance: Modified independence Stair Negotiation Technique: Step to Pattern with Single Rail on Right Number of Stairs: 4  Height of Stairs: 6"  Comments: R LE circumduction to ascend, wide BOS  GAIT: Gait pattern: Decreased R ankle DF on heel strike (foot slaps) with poor toe off, R hip circumduction during swim with decreased knee extension, decreased R LE stance time, compensatory trunk lean to R during stance phase Distance walked: >200' Assistive device utilized: None Level of assistance: Complete Independence  FUNCTIONAL TESTS:  5x STS 12.97 sec with heavy weightshift to L LE Berg Balance Test: 47/56 DGI: 14/24     PATIENT SURVEYS:  FOTO 66; predicted 76  TODAY'S TREATMENT:  DATE:  11/16/22 Elliptical fwd 2.5 min, bwd 2.5 min   Gastroc stretch x30" Soleus stretch x 30" Soleus strengthening x10 Eccentric step down 4" R 2x10 Single leg mini squat R 2x10 Alternating heel tap on 4" step 2x10 working on coordination to decrease hip hike and increase single leg stability Gait training x 5 laps around gym with foot up brace on R. Working on heel strike, knee flexion with toe off, with decreased R hip hike and increasing L step length. Min A to keep R LE from externally rotating during toe off.      PATIENT EDUCATION: Education details: Exam findings, POC, initial HEP Person educated: Patient Education method: Explanation, Demonstration, and Handouts Education comprehension: verbalized understanding, returned demonstration, and needs further education  HOME EXERCISE PROGRAM: Access Code: [email or web address] URL: [email or web address] Date: 11/02/2022 Prepared by: Vernon Prey April Kirstie Peri  Exercises - Supine Active Straight Leg Raise  - 1 x daily - 7 x weekly - 3 sets - 10 reps - Supine Single Leg Ankle Pumps  - 1 x daily - 7 x weekly - 3 sets - 10 reps - Hooklying Single Knee to Chest Stretch  - 1 x daily - 7 x weekly - 3 sets - 10 reps - Single Leg Bridge  - 1 x daily - 7 x weekly - 3 sets - 10 reps - Sit to Stand with Armchair  - 1 x daily - 7 x weekly - 3 sets - 10 reps  GOALS: Goals reviewed with patient? Yes  SHORT TERM GOALS: Target date: 11/30/2022   Pt will be ind with HEP Baseline: Goal status: IN PROGRESS  2.  Pt will be able to perform ankle DF in standing for heel strike Baseline:  Goal status: MET 11/14/22    LONG TERM GOALS: Target date: 12/28/2022   Pt will be ind with management and progression of HEP Baseline:  Goal status: INITIAL  2.  Pt will be able to demonstrate decreased hip circumduction with improved heel strike with or without R AFO for improved gait stabilty x 1000' for limited community distances Baseline: foot slap/decreased heel strike and toe off, R hip  circumduction, decreased knee extension during swing Goal status: INITIAL  3.  Pt will have improved FOTO score to >/=76 Baseline: 66 Goal status: INITIAL  4.  Pt will demo improved Berg Balance to >/=52/56 to be categorized as a low fall risk Baseline:  Goal status: INITIAL  5.  Pt will have improved DGI to >/=20/24 for low fall risk Baseline:  Goal status: INITIAL  6.  Pt will be able to ascend/descend 12 steps with normal reciprocal pattern without hand holds for improved entry/exit of home Baseline:  Goal status: INITIAL  ASSESSMENT:  CLINICAL IMPRESSION: Session focused on increasing R LE weight bearing, strengthening, stability and balance. Worked on eccentric quad control with step downs. Continued working on gait using foot up brace.   From eval: Patient is a 47 y.o. M who was seen today for physical therapy evaluation and treatment for subacute L CVA in June 2024. Pt has recently ended his home health therapy. Assessment significant for R LE weakness, decreased balance and gait instability affecting safety with home and community tasks limiting participation with return to work. Pt is able to obtain ankle DF in sitting; however, unable to activate these muscles in standing or supine. May require AFO pending on if pt is able to get ankle DF. May trial toe-up brace next session to work on normalizing  gait. Pt would highly benefit from PT to address these deficits to improve his level of function and decrease fall risk.   OBJECTIVE IMPAIRMENTS: Abnormal gait, decreased activity tolerance, decreased balance, decreased coordination, decreased endurance, decreased mobility, difficulty walking, decreased strength, and improper body mechanics.   PLAN:  PT FREQUENCY: 2x/week  PT DURATION: 8 weeks  PLANNED INTERVENTIONS: 97146- PT Re-evaluation, 97110-Therapeutic exercises, 97530- Therapeutic activity, [id number]- Neuromuscular re-education, 97535- Self Care, 16109- Manual therapy, [phone]-  Gait training, [phone]- Orthotic Fit/training, [phone]- Aquatic Therapy, [phone]- Ionotophoresis 4mg /ml Dexamethasone, Patient/Family education, Balance training, Stair training, Taping, Cryotherapy, and Moist heat  PLAN FOR NEXT SESSION: Assess response to HEP. Lunges, stairs/steps, and eccentric quad control. Continue ankle strengthening. R LE strengthening, balance, and coordination   Broughton Eppinger April Ma L Solina Heron, PT 11/16/2022, 1:59 PM

## 2022-11-21 ENCOUNTER — Encounter: Payer: Self-pay | Admitting: Physical Therapy

## 2022-11-21 ENCOUNTER — Ambulatory Visit: Payer: 59 | Admitting: Physical Therapy

## 2022-11-21 DIAGNOSIS — M6281 Muscle weakness (generalized): Secondary | ICD-10-CM | POA: Diagnosis not present

## 2022-11-21 DIAGNOSIS — R2681 Unsteadiness on feet: Secondary | ICD-10-CM

## 2022-11-21 DIAGNOSIS — R2689 Other abnormalities of gait and mobility: Secondary | ICD-10-CM

## 2022-11-21 NOTE — Therapy (Signed)
OUTPATIENT PHYSICAL THERAPY TREATMENT   Patient Name: James Ferrell MRN: [id number] DOB:1975-12-25, 47 y.o., male Today's Date: 11/21/2022   PCP: Lucienne Minks provider REFERRING PROVIDER: Idelia Salm, MD  END OF SESSION:  PT End of Session - 11/21/22 1355     Visit Number 6    Number of Visits 16    Date for PT Re-Evaluation 12/28/22    Authorization Type [employer] Medicare    Progress Note Due on Visit 10    PT Start Time 1400    PT Stop Time 1440    PT Time Calculation (min) 40 min    Activity Tolerance Patient tolerated treatment well    Behavior During Therapy [facility] for tasks assessed/performed                Past Medical History:  Diagnosis Date   CHB (complete heart block) (HCC) 2020   Endocarditis of aortic valve    Feb 2020 and April 2021   H/O mechanical aortic valve replacement    HIV (human immunodeficiency virus infection) (HCC)    Stroke [facility])    Past Surgical History:  Procedure Laterality Date   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/21/2019   With root replacement   BENTALL PROCEDURE  07/21/2019   CARDIAC VALVE SURGERY     MECHANICAL AORTIC VALVE REPLACEMENT  07/21/2019   Redo of valve surgery   PACEMAKER IMPLANT     TEE WITHOUT CARDIOVERSION N/A 05/13/2019   Procedure: TRANSESOPHAGEAL ECHOCARDIOGRAM (TEE);  Surgeon: Laurier Nancy, MD;  Location: [facility] ORS;  Service: Cardiovascular;  Laterality: N/A;   TOTAL HIP ARTHROPLASTY Right 08/30/2019   Procedure: TOTAL HIP ARTHROPLASTY ANTERIOR APPROACH;  Surgeon: Tarry Kos, MD;  Location: [facility] OR;  Service: Orthopedics;  Laterality: Right;   Patient Active Problem List   Diagnosis Date Noted   Status post mechanical aortic valve replacement 08/30/2019   Closed displaced fracture of right femoral neck (HCC) 08/30/2019   HIV (human immunodeficiency virus infection) (HCC) 05/20/2019   Bacteremia due to Staphylococcus aureus 05/10/2019   Sepsis (HCC) 05/09/2019   Leukocytosis 05/09/2019   Hypotension  05/09/2019   AKI (acute kidney injury) (HCC) 05/09/2019    ONSET DATE: June 2024  REFERRING DIAG: I63.9 (ICD-10-CM) - CVA (cerebral vascular accident)  THERAPY DIAG:  Muscle weakness  Other abnormalities of gait and mobility  Unsteadiness on feet  Rationale for Evaluation and Treatment: Rehabilitation  SUBJECTIVE:  SUBJECTIVE STATEMENT: Pt states he's been able to do his exercise a few days. Pt states he has an appointment to see the neurologist in December.   From eval: Pt reports he had a CVA this past June. Pt states his recovery has been going well. Pt states R LE is still weak. Pt states home health PT ended last week. Pt states balancing is still hard as well as going up/down the stairs. Wants to be able to walk better. Is interested in returning to work if able.  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   Per chart pt admit 6/4-6/12, went to inpatient rehab 6/12-6/28 for his CVA  PAIN:  Are you having pain? No  PRECAUTIONS: None  RED FLAGS: None   WEIGHT BEARING RESTRICTIONS: No  FALLS: Has patient fallen in last 6 months? No  LIVING ENVIRONMENT: Lives with: lives alone Lives in: House/apartment Stairs: Yes: External: 12 steps; bilateral but cannot reach both Has following equipment at home: None  PLOF: Independent  PATIENT GOALS: Improve mobility for stairs and gait  quality  OBJECTIVE:  Note: Objective measures were completed at Evaluation unless otherwise noted.  DIAGNOSTIC FINDINGS: CTA Head 06/26/22: Partially occlusive thrombus of the left distal M1 segment.   COORDINATION: Decreased on R due to strength deficits  MUSCLE TONE: RLE: +1 with knee and hip extension, multibeat myoclonus x 5 sec with R ankle DF  LOWER EXTREMITY ROM:     Active  Right Eval Left Eval  Hip flexion    Hip extension    Hip abduction    Hip adduction    Hip internal rotation    Hip external rotation    Knee flexion    Knee extension    Ankle dorsiflexion    Ankle plantarflexion    Ankle inversion    Ankle eversion     (Blank rows = not tested)  LOWER EXTREMITY MMT:    MMT Right Eval Left Eval  Hip flexion 5 5  Hip extension 4- 5  Hip abduction 4 4  Hip adduction    Hip internal rotation    Hip external rotation    Knee flexion 3+ 5  Knee extension 4- 5  Ankle dorsiflexion 3+ in seated unable in standing 5  Ankle plantarflexion 4 5  Ankle inversion    Ankle eversion    (Blank rows = not tested)  BED MOBILITY:  Independent  TRANSFERS: Independent  STAIRS: Level of Assistance: Modified independence Stair Negotiation Technique: Step to Pattern with Single Rail on Right Number of Stairs: 4  Height of Stairs: 6"  Comments: R LE circumduction to ascend, wide BOS  GAIT: Gait pattern: Decreased R ankle DF on heel strike (foot slaps) with poor toe off, R hip circumduction during swim with decreased knee extension, decreased R LE stance time, compensatory trunk lean to R during stance phase Distance walked: >200' Assistive device utilized: None Level of assistance: Complete Independence  FUNCTIONAL TESTS:  5x STS 12.97 sec with heavy weightshift to L LE Berg Balance Test: 47/56 DGI: 14/24     PATIENT SURVEYS:  FOTO 66; predicted 76  TODAY'S TREATMENT:  DATE:  11/21/22 Standing gastroc stretch 2x30" Standing soleus stretch 2x30" Elliptical fwd 2.5 min, bwd 2.5 min  Standing ankle DF x10 Lunge on 6" step x10 Runner's step up 2x10 with intermittent UE Single leg mini squat R 2x10 On fit board: ankle DF/PF 2x10, L<>R 2x10 Gait training x 5 laps around gym with foot up brace on R. Working on heel strike, knee flexion with toe off, with decreased R hip hike and increasing L step length. Min A to keep R LE from externally rotating during toe off.      PATIENT EDUCATION: Education details: Exam findings, POC, initial HEP Person educated: Patient Education method: Explanation, Demonstration, and Handouts Education comprehension: verbalized understanding, returned demonstration, and needs further education  HOME EXERCISE PROGRAM: Access Code: [email or web address] URL: [email or web address] Date: 11/02/2022 Prepared by: Vernon Prey April Kirstie Peri  Exercises - Supine Active Straight Leg Raise  - 1 x daily - 7 x weekly - 3 sets - 10 reps - Supine Single Leg Ankle Pumps  - 1 x daily - 7 x weekly - 3 sets - 10 reps - Hooklying Single Knee to Chest Stretch  - 1 x daily - 7 x weekly - 3 sets - 10 reps - Single Leg Bridge  - 1 x daily - 7 x weekly - 3 sets - 10 reps - Sit to Stand with Armchair  - 1 x daily - 7 x weekly - 3 sets - 10 reps  GOALS: Goals reviewed with patient? Yes  SHORT TERM GOALS: Target date: 11/30/2022   Pt will be ind with HEP Baseline: Goal status: IN PROGRESS  2.  Pt will be able to perform ankle DF in standing for heel strike Baseline:  Goal status: MET 11/14/22    LONG TERM GOALS: Target date: 12/28/2022   Pt will be ind with management and progression of HEP Baseline:  Goal status: INITIAL  2.  Pt will be able to demonstrate decreased hip circumduction with improved heel strike with or without R AFO for improved gait stabilty x 1000' for limited community  distances Baseline: foot slap/decreased heel strike and toe off, R hip circumduction, decreased knee extension during swing Goal status: INITIAL  3.  Pt will have improved FOTO score to >/=76 Baseline: 66 Goal status: INITIAL  4.  Pt will demo improved Berg Balance to >/=52/56 to be categorized as a low fall risk Baseline:  Goal status: INITIAL  5.  Pt will have improved DGI to >/=20/24 for low fall risk Baseline:  Goal status: INITIAL  6.  Pt will be able to ascend/descend 12 steps with normal reciprocal pattern without hand holds for improved entry/exit of home Baseline:  Goal status: INITIAL  ASSESSMENT:  CLINICAL IMPRESSION: Continued to work on quad and single leg strengthening on R. Worked on NMED for ankle/bilat LE stability on fitter board. Improving BOS with ambulation and decreasing hip hike. Continues to require cues for increased R LE weight shift/stance phase and for knee bend prior to swing phase.   From eval: Patient is a 47 y.o. M who was seen today for physical therapy evaluation and treatment for subacute L CVA in June 2024. Pt has recently ended his home health therapy. Assessment significant for R LE weakness, decreased balance and gait instability affecting safety with home and community tasks limiting participation with return to work. Pt is able to obtain ankle DF in sitting; however, unable to activate these muscles in standing or supine. May require AFO pending on if pt  is able to get ankle DF. May trial toe-up brace next session to work on normalizing gait. Pt would highly benefit from PT to address these deficits to improve his level of function and decrease fall risk.   OBJECTIVE IMPAIRMENTS: Abnormal gait, decreased activity tolerance, decreased balance, decreased coordination, decreased endurance, decreased mobility, difficulty walking, decreased strength, and improper body mechanics.   PLAN:  PT FREQUENCY: 2x/week  PT DURATION: 8 weeks  PLANNED  INTERVENTIONS: 97146- PT Re-evaluation, 97110-Therapeutic exercises, 97530- Therapeutic activity, [id number]- Neuromuscular re-education, 97535- Self Care, 65784- Manual therapy, [phone]- Gait training, [phone]- Orthotic Fit/training, [phone]- Aquatic Therapy, [phone]- Ionotophoresis 4mg /ml Dexamethasone, Patient/Family education, Balance training, Stair training, Taping, Cryotherapy, and Moist heat  PLAN FOR NEXT SESSION: Assess response to HEP. Lunges, stairs/steps, and eccentric quad control. Continue ankle strengthening. R LE strengthening, balance, and coordination   Celestino Ackerman April Ma L Thaila Bottoms, PT 11/21/2022, 1:55 PM

## 2022-11-23 ENCOUNTER — Ambulatory Visit: Payer: 59 | Attending: Internal Medicine | Admitting: Physical Therapy

## 2022-11-23 DIAGNOSIS — R2681 Unsteadiness on feet: Secondary | ICD-10-CM | POA: Diagnosis present

## 2022-11-23 DIAGNOSIS — R2689 Other abnormalities of gait and mobility: Secondary | ICD-10-CM | POA: Diagnosis present

## 2022-11-23 DIAGNOSIS — M6281 Muscle weakness (generalized): Secondary | ICD-10-CM | POA: Diagnosis present

## 2022-11-23 NOTE — Therapy (Signed)
OUTPATIENT PHYSICAL THERAPY TREATMENT   Patient Name: James Ferrell MRN: [id number] DOB:08-03-1975, 47 y.o., male Today's Date: 11/23/2022   PCP: Lucienne Minks provider REFERRING PROVIDER: Idelia Salm, MD  END OF SESSION:  PT End of Session - 11/23/22 1447     Visit Number 7    Number of Visits 16    Date for PT Re-Evaluation 12/28/22    Authorization Type [employer] Medicare    Progress Note Due on Visit 10    PT Start Time 1400    PT Stop Time 1445    PT Time Calculation (min) 45 min    Activity Tolerance Patient tolerated treatment well    Behavior During Therapy [facility] for tasks assessed/performed                 Past Medical History:  Diagnosis Date   CHB (complete heart block) (HCC) 2020   Endocarditis of aortic valve    Feb 2020 and April 2021   H/O mechanical aortic valve replacement    HIV (human immunodeficiency virus infection) (HCC)    Stroke [facility])    Past Surgical History:  Procedure Laterality Date   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/21/2019   With root replacement   BENTALL PROCEDURE  07/21/2019   CARDIAC VALVE SURGERY     MECHANICAL AORTIC VALVE REPLACEMENT  07/21/2019   Redo of valve surgery   PACEMAKER IMPLANT     TEE WITHOUT CARDIOVERSION N/A 05/13/2019   Procedure: TRANSESOPHAGEAL ECHOCARDIOGRAM (TEE);  Surgeon: Laurier Nancy, MD;  Location: [facility] ORS;  Service: Cardiovascular;  Laterality: N/A;   TOTAL HIP ARTHROPLASTY Right 08/30/2019   Procedure: TOTAL HIP ARTHROPLASTY ANTERIOR APPROACH;  Surgeon: Tarry Kos, MD;  Location: [facility] OR;  Service: Orthopedics;  Laterality: Right;   Patient Active Problem List   Diagnosis Date Noted   Status post mechanical aortic valve replacement 08/30/2019   Closed displaced fracture of right femoral neck (HCC) 08/30/2019   HIV (human immunodeficiency virus infection) (HCC) 05/20/2019   Bacteremia due to Staphylococcus aureus 05/10/2019   Sepsis (HCC) 05/09/2019   Leukocytosis 05/09/2019   Hypotension  05/09/2019   AKI (acute kidney injury) (HCC) 05/09/2019    ONSET DATE: June 2024  REFERRING DIAG: I63.9 (ICD-10-CM) - CVA (cerebral vascular accident)  THERAPY DIAG:  No diagnosis found.  Rationale for Evaluation and Treatment: Rehabilitation  SUBJECTIVE:                                                                                                                                                                                             SUBJECTIVE STATEMENT: Pt states he's been  able to do his exercise a few days. Pt states he has an appointment to see the neurologist in December.   From eval: Pt reports he had a CVA this past June. Pt states his recovery has been going well. Pt states R LE is still weak. Pt states home health PT ended last week. Pt states balancing is still hard as well as going up/down the stairs. Wants to be able to walk better. Is interested in returning to work if able.  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   Per chart pt admit 6/4-6/12, went to inpatient rehab 6/12-6/28 for his CVA  PAIN:  Are you having pain? No  PRECAUTIONS: None  RED FLAGS: None   WEIGHT BEARING RESTRICTIONS: No  FALLS: Has patient fallen in last 6 months? No  LIVING ENVIRONMENT: Lives with: lives alone Lives in: House/apartment Stairs: Yes: External: 12 steps; bilateral but cannot reach both Has following equipment at home: None  PLOF: Independent  PATIENT GOALS: Improve mobility for stairs and gait quality  OBJECTIVE:  Note: Objective measures were completed at  Evaluation unless otherwise noted.  DIAGNOSTIC FINDINGS: CTA Head 06/26/22: Partially occlusive thrombus of the left distal M1 segment.   COORDINATION: Decreased on R due to strength deficits  MUSCLE TONE: RLE: +1 with knee and hip extension, multibeat myoclonus x 5 sec with R ankle DF  LOWER EXTREMITY ROM:     Active  Right Eval Left Eval  Hip flexion    Hip extension    Hip abduction    Hip adduction    Hip internal rotation    Hip external rotation    Knee flexion    Knee extension    Ankle dorsiflexion    Ankle plantarflexion    Ankle inversion    Ankle eversion     (Blank rows = not tested)  LOWER EXTREMITY MMT:    MMT Right Eval Left Eval  Hip flexion 5 5  Hip extension 4- 5  Hip abduction 4 4  Hip adduction    Hip internal rotation    Hip external rotation    Knee flexion 3+ 5  Knee extension 4- 5  Ankle dorsiflexion 3+ in seated unable in standing 5  Ankle plantarflexion 4 5  Ankle inversion    Ankle eversion    (Blank rows = not tested)  BED MOBILITY:  Independent  TRANSFERS: Independent  STAIRS: Level of Assistance: Modified independence Stair Negotiation Technique: Step to Pattern with Single Rail on Right Number of Stairs: 4  Height of Stairs: 6"  Comments: R LE circumduction to ascend, wide BOS  GAIT: Gait pattern: Decreased R ankle DF on heel strike (foot slaps) with poor toe off, R hip circumduction during swim with decreased knee extension, decreased R LE stance time, compensatory trunk lean to R during stance phase Distance walked: >200' Assistive device utilized: None Level of assistance: Complete Independence  FUNCTIONAL TESTS:  5x STS 12.97 sec with heavy weightshift to L LE Berg Balance Test: 47/56 DGI: 14/24     PATIENT SURVEYS:  FOTO 66; predicted 76  TODAY'S TREATMENT:  DATE:  11/23/22 Standing  on steps gastroc stretch x30" Standing on steps soleus stretch x30" Lean back on wall to keep hips from compensating R ankle DF/PF with right foot on rockerboard 3x10 Lean back on wall toe raises both feet on on rockerboard 3x10 Elliptical fwd 3 min, bwd 3 min Single leg mini squat R x10 Single leg press 50# x10, 70# 2x10 Gait training x 5 laps around gym with foot up brace on R. Working on heel strike, knee flexion with toe off, with decreased R hip hike and increasing L step length. Min A to keep R LE from externally rotating during toe off.     PATIENT EDUCATION: Education details: Exam findings, POC, initial HEP Person educated: Patient Education method: Explanation, Demonstration, and Handouts Education comprehension: verbalized understanding, returned demonstration, and needs further education  HOME EXERCISE PROGRAM: Access Code: [email or web address] URL: [email or web address] Date: 11/02/2022 Prepared by: Vernon Prey April Kirstie Peri  Exercises - Supine Active Straight Leg Raise  - 1 x daily - 7 x weekly - 3 sets - 10 reps - Supine Single Leg Ankle Pumps  - 1 x daily - 7 x weekly - 3 sets - 10 reps - Hooklying Single Knee to Chest Stretch  - 1 x daily - 7 x weekly - 3 sets - 10 reps - Single Leg Bridge  - 1 x daily - 7 x weekly - 3 sets - 10 reps - Sit to Stand with Armchair  - 1 x daily - 7 x weekly - 3 sets - 10 reps  GOALS: Goals reviewed with patient? Yes  SHORT TERM GOALS: Target date: 11/30/2022   Pt will be ind with HEP Baseline: Goal status: IN PROGRESS  2.  Pt will be able to perform ankle DF in standing for heel strike Baseline:  Goal status: MET 11/14/22    LONG TERM GOALS: Target date: 12/28/2022   Pt will be ind with management and progression of HEP Baseline:  Goal status: INITIAL  2.  Pt will be able to demonstrate decreased hip circumduction with improved heel strike with or without R AFO for improved gait stabilty x 1000' for limited community  distances Baseline: foot slap/decreased heel strike and toe off, R hip circumduction, decreased knee extension during swing Goal status: INITIAL  3.  Pt will have improved FOTO score to >/=76 Baseline: 66 Goal status: INITIAL  4.  Pt will demo improved Berg Balance to >/=52/56 to be categorized as a low fall risk Baseline:  Goal status: INITIAL  5.  Pt will have improved DGI to >/=20/24 for low fall risk Baseline:  Goal status: INITIAL  6.  Pt will be able to ascend/descend 12 steps with normal reciprocal pattern without hand holds for improved entry/exit of home Baseline:  Goal status: INITIAL  ASSESSMENT:  CLINICAL IMPRESSION: Continued to work on R LE strength and stability. Improving gait noted with narrower BOS. Still working on decreasing hip hike but in general this continues to decrease.   From eval: Patient is a 47 y.o. M who was seen today for physical therapy evaluation and treatment for subacute L CVA in June 2024. Pt has recently ended his home health therapy. Assessment significant for R LE weakness, decreased balance and gait instability affecting safety with home and community tasks limiting participation with return to work. Pt is able to obtain ankle DF in sitting; however, unable to activate these muscles in standing or supine. May require AFO pending on if pt is able to get  ankle DF. May trial toe-up brace next session to work on normalizing gait. Pt would highly benefit from PT to address these deficits to improve his level of function and decrease fall risk.   OBJECTIVE IMPAIRMENTS: Abnormal gait, decreased activity tolerance, decreased balance, decreased coordination, decreased endurance, decreased mobility, difficulty walking, decreased strength, and improper body mechanics.   PLAN:  PT FREQUENCY: 2x/week  PT DURATION: 8 weeks  PLANNED INTERVENTIONS: 97146- PT Re-evaluation, 97110-Therapeutic exercises, 97530- Therapeutic activity, [id number]- Neuromuscular  re-education, 97535- Self Care, 72536- Manual therapy, [phone]- Gait training, [phone]- Orthotic Fit/training, [phone]- Aquatic Therapy, [phone]- Ionotophoresis 4mg /ml Dexamethasone, Patient/Family education, Balance training, Stair training, Taping, Cryotherapy, and Moist heat  PLAN FOR NEXT SESSION: Assess response to HEP. Lunges, stairs/steps, and eccentric quad control. Continue ankle strengthening. R LE strengthening, balance, and coordination   Zian Delair April Ma L Stiles Maxcy, PT 11/23/2022, 2:47 PM

## 2022-11-28 ENCOUNTER — Ambulatory Visit: Payer: 59 | Admitting: Physical Therapy

## 2022-11-28 DIAGNOSIS — R2689 Other abnormalities of gait and mobility: Secondary | ICD-10-CM

## 2022-11-28 DIAGNOSIS — R2681 Unsteadiness on feet: Secondary | ICD-10-CM

## 2022-11-28 DIAGNOSIS — M6281 Muscle weakness (generalized): Secondary | ICD-10-CM

## 2022-11-28 NOTE — Therapy (Signed)
OUTPATIENT PHYSICAL THERAPY TREATMENT   Patient Name: James Ferrell MRN: [id number] DOB:[birth year], 47 y.o., male Today's Date: 11/28/2022   PCP: Lucienne Minks provider REFERRING PROVIDER: Idelia Salm, MD  END OF SESSION:  PT End of Session - 11/28/22 1357     Visit Number 8    Number of Visits 16    Date for PT Re-Evaluation 12/28/22    Authorization Type [employer] Medicare    Progress Note Due on Visit 10    PT Start Time 1400    PT Stop Time 1440    PT Time Calculation (min) 40 min    Activity Tolerance Patient tolerated treatment well    Behavior During Therapy [facility] for tasks assessed/performed                 Past Medical History:  Diagnosis Date   CHB (complete heart block) (HCC) 2020   Endocarditis of aortic valve    Feb 2020 and April 2021   H/O mechanical aortic valve replacement    HIV (human immunodeficiency virus infection) (HCC)    Stroke [facility])    Past Surgical History:  Procedure Laterality Date   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/21/2019   With root replacement   BENTALL PROCEDURE  07/21/2019   CARDIAC VALVE SURGERY     MECHANICAL AORTIC VALVE REPLACEMENT  07/21/2019   Redo of valve surgery   PACEMAKER IMPLANT     TEE WITHOUT CARDIOVERSION N/A 05/13/2019   Procedure: TRANSESOPHAGEAL ECHOCARDIOGRAM (TEE);  Surgeon: Laurier Nancy, MD;  Location: [facility] ORS;  Service: Cardiovascular;  Laterality: N/A;   TOTAL HIP ARTHROPLASTY Right 08/30/2019   Procedure: TOTAL HIP ARTHROPLASTY ANTERIOR APPROACH;  Surgeon: Tarry Kos, MD;  Location: [facility] OR;  Service: Orthopedics;  Laterality: Right;   Patient Active Problem List   Diagnosis Date Noted   Status post mechanical aortic valve replacement 08/30/2019   Closed displaced fracture of right femoral neck (HCC) 08/30/2019   HIV (human immunodeficiency virus infection) (HCC) 05/20/2019   Bacteremia due to Staphylococcus aureus 05/10/2019   Sepsis (HCC) 05/09/2019   Leukocytosis 05/09/2019   Hypotension  05/09/2019   AKI (acute kidney injury) (HCC) 05/09/2019    ONSET DATE: June 2024  REFERRING DIAG: I63.9 (ICD-10-CM) - CVA (cerebral vascular accident)  THERAPY DIAG:  No diagnosis found.  Rationale for Evaluation and Treatment: Rehabilitation  SUBJECTIVE:                                                                                                                                                                                             SUBJECTIVE STATEMENT: Pt states he did  his exercises once yesterday. Nothing else new to report.   From eval: Pt reports he had a CVA this past June. Pt states his recovery has been going well. Pt states R LE is still weak. Pt states home health PT ended last week. Pt states balancing is still hard as well as going up/down the stairs. Wants to be able to walk better. Is interested in returning to work if able.  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   Per chart pt admit 6/4-6/12, went to inpatient rehab 6/12-6/28 for his CVA  PAIN:  Are you having pain? No  PRECAUTIONS: None  RED FLAGS: None   WEIGHT BEARING RESTRICTIONS: No  FALLS: Has patient fallen in last 6 months? No  LIVING ENVIRONMENT: Lives with: lives alone Lives in: House/apartment Stairs: Yes: External: 12 steps; bilateral but cannot reach both Has following equipment at home: None  PLOF: Independent  PATIENT GOALS: Improve mobility for stairs and gait quality  OBJECTIVE:  Note: Objective measures were completed at Evaluation unless otherwise noted.  DIAGNOSTIC  FINDINGS: CTA Head 06/26/22: Partially occlusive thrombus of the left distal M1 segment.   COORDINATION: Decreased on R due to strength deficits  MUSCLE TONE: RLE: +1 with knee and hip extension, multibeat myoclonus x 5 sec with R ankle DF  LOWER EXTREMITY ROM:     Active  Right Eval Left Eval  Hip flexion    Hip extension    Hip abduction    Hip adduction    Hip internal rotation    Hip external rotation    Knee flexion    Knee extension    Ankle dorsiflexion    Ankle plantarflexion    Ankle inversion    Ankle eversion     (Blank rows = not tested)  LOWER EXTREMITY MMT:    MMT Right Eval Left Eval  Hip flexion 5 5  Hip extension 4- 5  Hip abduction 4 4  Hip adduction    Hip internal rotation    Hip external rotation    Knee flexion 3+ 5  Knee extension 4- 5  Ankle dorsiflexion 3+ in seated unable in standing 5  Ankle plantarflexion 4 5  Ankle inversion    Ankle eversion    (Blank rows = not tested)  BED MOBILITY:  Independent  TRANSFERS: Independent  STAIRS: Level of Assistance: Modified independence Stair Negotiation Technique: Step to Pattern with Single Rail on Right Number of Stairs: 4  Height of Stairs: 6"  Comments: R LE circumduction to ascend, wide BOS  GAIT: Gait pattern: Decreased R ankle DF on heel strike (foot slaps) with poor toe off, R hip circumduction during swim with decreased knee extension, decreased R LE stance time, compensatory trunk lean to R during stance phase Distance walked: >200' Assistive device utilized: None Level of assistance: Complete Independence  FUNCTIONAL TESTS:  5x STS 12.97 sec with heavy weightshift to L LE Berg Balance Test: 47/56 DGI: 14/24     PATIENT SURVEYS:  FOTO 66; predicted 76  TODAY'S TREATMENT:  DATE:  11/28/22 Standing gastroc stretch on slant board 2x30" Standing  soleus stretch on slant board 2x30" Feet on decline, ankle DF 3x10 Feet on decline, mini squat 3x10 Leg press single leg on R only 70# x10, 80# 2x10 Single leg standing with ball roll under other foot forward and backward, side to side, circles 3x10 each Heel/toe walking with targets for improved coordination   11/23/22 Standing on steps gastroc stretch x30" Standing on steps soleus stretch x30" Lean back on wall to keep hips from compensating R ankle DF/PF with right foot on rockerboard 3x10 Lean back on wall toe raises both feet on on rockerboard 3x10 Elliptical fwd 3 min, bwd 3 min Single leg mini squat R x10 Single leg press 50# x10, 70# 2x10 Gait training x 5 laps around gym with foot up brace on R. Working on heel strike, knee flexion with toe off, with decreased R hip hike and increasing L step length. Min A to keep R LE from externally rotating during toe off.     PATIENT EDUCATION: Education details: Exam findings, POC, initial HEP Person educated: Patient Education method: Explanation, Demonstration, and Handouts Education comprehension: verbalized understanding, returned demonstration, and needs further education  HOME EXERCISE PROGRAM: Access Code: [email or web address] URL: [email or web address] Date: 11/02/2022 Prepared by: Vernon Prey April Kirstie Peri  Exercises - Supine Active Straight Leg Raise  - 1 x daily - 7 x weekly - 3 sets - 10 reps - Supine Single Leg Ankle Pumps  - 1 x daily - 7 x weekly - 3 sets - 10 reps - Hooklying Single Knee to Chest Stretch  - 1 x daily - 7 x weekly - 3 sets - 10 reps - Single Leg Bridge  - 1 x daily - 7 x weekly - 3 sets - 10 reps - Sit to Stand with Armchair  - 1 x daily - 7 x weekly - 3 sets - 10 reps  GOALS: Goals reviewed with patient? Yes  SHORT TERM GOALS: Target date: 11/30/2022   Pt will be ind with HEP Baseline: Goal status: IN PROGRESS  2.  Pt will be able to perform ankle DF in standing for heel strike Baseline:   Goal status: MET 11/14/22    LONG TERM GOALS: Target date: 12/28/2022   Pt will be ind with management and progression of HEP Baseline:  Goal status: INITIAL  2.  Pt will be able to demonstrate decreased hip circumduction with improved heel strike with or without R AFO for improved gait stabilty x 1000' for limited community distances Baseline: foot slap/decreased heel strike and toe off, R hip circumduction, decreased knee extension during swing Goal status: INITIAL  3.  Pt will have improved FOTO score to >/=76 Baseline: 66 Goal status: INITIAL  4.  Pt will demo improved Berg Balance to >/=52/56 to be categorized as a low fall risk Baseline:  Goal status: INITIAL  5.  Pt will have improved DGI to >/=20/24 for low fall risk Baseline:  Goal status: INITIAL  6.  Pt will be able to ascend/descend 12 steps with normal reciprocal pattern without hand holds for improved entry/exit of home Baseline:  Goal status: INITIAL  ASSESSMENT:  CLINICAL IMPRESSION: Session focused on increased R LE stability and coordination. Working on control with rolling ball in various directions. Improving ankle dorsiflexion for heel strike in standing. Continued to encourage pt to work on his exercises at home.   From eval: Patient is a 47 y.o. M who was seen today for  physical therapy evaluation and treatment for subacute L CVA in June 2024. Pt has recently ended his home health therapy. Assessment significant for R LE weakness, decreased balance and gait instability affecting safety with home and community tasks limiting participation with return to work. Pt is able to obtain ankle DF in sitting; however, unable to activate these muscles in standing or supine. May require AFO pending on if pt is able to get ankle DF. May trial toe-up brace next session to work on normalizing gait. Pt would highly benefit from PT to address these deficits to improve his level of function and decrease fall risk.    OBJECTIVE IMPAIRMENTS: Abnormal gait, decreased activity tolerance, decreased balance, decreased coordination, decreased endurance, decreased mobility, difficulty walking, decreased strength, and improper body mechanics.   PLAN:  PT FREQUENCY: 2x/week  PT DURATION: 8 weeks  PLANNED INTERVENTIONS: 97146- PT Re-evaluation, 97110-Therapeutic exercises, 97530- Therapeutic activity, [id number]- Neuromuscular re-education, 97535- Self Care, 16109- Manual therapy, [phone]- Gait training, [phone]- Orthotic Fit/training, [phone]- Aquatic Therapy, [phone]- Ionotophoresis 4mg /ml Dexamethasone, Patient/Family education, Balance training, Stair training, Taping, Cryotherapy, and Moist heat  PLAN FOR NEXT SESSION: Assess response to HEP. Lunges, stairs/steps, and eccentric quad control. Continue ankle strengthening. R LE strengthening, balance, and coordination   Shronda Boeh April Ma L Larry Alcock, PT 11/28/2022, 1:57 PM

## 2022-11-30 ENCOUNTER — Ambulatory Visit: Payer: 59 | Admitting: Physical Therapy

## 2022-11-30 DIAGNOSIS — M6281 Muscle weakness (generalized): Secondary | ICD-10-CM | POA: Diagnosis not present

## 2022-11-30 DIAGNOSIS — R2681 Unsteadiness on feet: Secondary | ICD-10-CM

## 2022-11-30 DIAGNOSIS — R2689 Other abnormalities of gait and mobility: Secondary | ICD-10-CM

## 2022-11-30 NOTE — Therapy (Signed)
OUTPATIENT PHYSICAL THERAPY TREATMENT   Patient Name: James Ferrell MRN: [id number] DOB:1975-12-02, 47 y.o., male Today's Date: 11/30/2022   PCP: Lucienne Minks provider REFERRING PROVIDER: Idelia Salm, MD  END OF SESSION:        Past Medical History:  Diagnosis Date   CHB (complete heart block) (HCC) 2020   Endocarditis of aortic valve    Feb 2020 and April 2021   H/O mechanical aortic valve replacement    HIV (human immunodeficiency virus infection) (HCC)    Stroke [facility])    Past Surgical History:  Procedure Laterality Date   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/21/2019   With root replacement   BENTALL PROCEDURE  07/21/2019   CARDIAC VALVE SURGERY     MECHANICAL AORTIC VALVE REPLACEMENT  07/21/2019   Redo of valve surgery   PACEMAKER IMPLANT     TEE WITHOUT CARDIOVERSION N/A 05/13/2019   Procedure: TRANSESOPHAGEAL ECHOCARDIOGRAM (TEE);  Surgeon: Laurier Nancy, MD;  Location: [facility] ORS;  Service: Cardiovascular;  Laterality: N/A;   TOTAL HIP ARTHROPLASTY Right 08/30/2019   Procedure: TOTAL HIP ARTHROPLASTY ANTERIOR APPROACH;  Surgeon: Tarry Kos, MD;  Location: [facility] OR;  Service: Orthopedics;  Laterality: Right;   Patient Active Problem List   Diagnosis Date Noted   Status post mechanical aortic valve replacement 08/30/2019   Closed displaced fracture of right femoral neck (HCC) 08/30/2019   HIV (human immunodeficiency virus infection) (HCC) 05/20/2019   Bacteremia due to Staphylococcus aureus 05/10/2019   Sepsis (HCC) 05/09/2019   Leukocytosis 05/09/2019   Hypotension 05/09/2019   AKI (acute kidney injury) (HCC) 05/09/2019    ONSET DATE: June 2024  REFERRING DIAG: I63.9 (ICD-10-CM) - CVA (cerebral vascular accident)  THERAPY DIAG:  No diagnosis found.  Rationale for Evaluation and Treatment: Rehabilitation  SUBJECTIVE:                                                                                                                                                                                              SUBJECTIVE STATEMENT: Pt reports he has been practicing and doing his exercises.   From eval: Pt reports he had a CVA this past June. Pt states his recovery has been going well. Pt states R LE is still weak. Pt states home health PT ended last week. Pt states balancing is still hard as well as going up/down the stairs. Wants to be able to walk better. Is interested in returning to work if able.  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   Per chart pt admit 6/4-6/12, went to inpatient rehab 6/12-6/28 for his CVA  PAIN:  Are you having pain? No  PRECAUTIONS: None  RED FLAGS: None   WEIGHT BEARING RESTRICTIONS: No  FALLS: Has patient fallen in last 6 months? No  LIVING ENVIRONMENT: Lives with: lives alone Lives in: House/apartment Stairs: Yes: External: 12 steps; bilateral but cannot reach both Has following equipment at home: None  PLOF: Independent  PATIENT GOALS: Improve mobility for stairs and gait quality  OBJECTIVE:  Note: Objective measures were completed at Evaluation unless otherwise noted.  DIAGNOSTIC FINDINGS: CTA Head 06/26/22: Partially occlusive thrombus of the left distal M1 segment.   COORDINATION: Decreased on R due to strength deficits  MUSCLE TONE: RLE: +1 with knee and hip extension, multibeat myoclonus x 5 sec with R ankle DF  LOWER EXTREMITY ROM:     Active  Right Eval Left Eval  Hip flexion    Hip extension    Hip abduction    Hip adduction    Hip internal rotation    Hip external  rotation    Knee flexion    Knee extension    Ankle dorsiflexion    Ankle plantarflexion    Ankle inversion    Ankle eversion     (Blank rows = not tested)  LOWER EXTREMITY MMT:    MMT Right Eval Left Eval  Hip flexion 5 5  Hip extension 4- 5  Hip abduction 4 4  Hip adduction    Hip internal rotation    Hip external rotation    Knee flexion 3+ 5  Knee extension 4- 5  Ankle dorsiflexion 3+ in seated unable in standing 5  Ankle plantarflexion 4 5  Ankle inversion    Ankle eversion    (Blank rows = not tested)  BED MOBILITY:  Independent  TRANSFERS: Independent  STAIRS: Level of Assistance: Modified independence Stair Negotiation Technique: Step to Pattern with Single Rail on Right Number of Stairs: 4  Height of Stairs: 6"  Comments: R LE circumduction to ascend, wide BOS  GAIT: Gait pattern: Decreased R ankle DF on heel strike (foot slaps) with poor toe off, R hip circumduction during swim with decreased knee extension, decreased R LE stance time, compensatory trunk lean to R during stance phase Distance walked: >200' Assistive device utilized: None Level of assistance: Complete Independence  FUNCTIONAL TESTS:  5x STS 12.97 sec with heavy weightshift to L LE Berg Balance Test: 47/56 DGI: 14/24     PATIENT SURVEYS:  FOTO 66; predicted 76  TODAY'S TREATMENT:  DATE:  11/30/22 Standing gastroc stretch on slant board x1 min Standing soleus stretch on slant board x1 min Eccentric single leg stand to sit x10 with UE assist Single leg sit to stand 2x10 with UE assist Standing ankle DF 3x10 Heel/toe walking with targets for improved coordination Forwards walking x3 laps around gym working on keeping narrower BOS, equal step length, heel strike with toe cover for improved clearance during swing phase Backwards walking 4x40' working on hip  and knee extension control as well as backwards weight shift   PATIENT EDUCATION: Education details: Exam findings, POC, initial HEP Person educated: Patient Education method: Explanation, Demonstration, and Handouts Education comprehension: verbalized understanding, returned demonstration, and needs further education  HOME EXERCISE PROGRAM: Access Code: [email or web address] URL: [email or web address] Date: 11/02/2022 Prepared by: Vernon Prey April Kirstie Peri  Exercises - Supine Active Straight Leg Raise  - 1 x daily - 7 x weekly - 3 sets - 10 reps - Supine Single Leg Ankle Pumps  - 1 x daily - 7 x weekly - 3 sets - 10 reps - Hooklying Single Knee to Chest Stretch  - 1 x daily - 7 x weekly - 3 sets - 10 reps - Single Leg Bridge  - 1 x daily - 7 x weekly - 3 sets - 10 reps - Sit to Stand with Armchair  - 1 x daily - 7 x weekly - 3 sets - 10 reps  GOALS: Goals reviewed with patient? Yes  SHORT TERM GOALS: Target date: 11/30/2022   Pt will be ind with HEP Baseline: Goal status: PARTIALLY MET -- not consistent 11/30/22   2.  Pt will be able to perform ankle DF in standing for heel strike Baseline:  Goal status: MET 11/14/22    LONG TERM GOALS: Target date: 12/28/2022   Pt will be ind with management and progression of HEP Baseline:  Goal status: INITIAL  2.  Pt will be able to demonstrate decreased hip circumduction with improved heel strike with or without R AFO for improved gait stabilty x 1000' for limited community distances Baseline: foot slap/decreased heel strike and toe off, R hip circumduction, decreased knee flexion during swing Goal status: INITIAL  3.  Pt will have improved FOTO score to >/=76 Baseline: 66 Goal status: INITIAL  4.  Pt will demo improved Berg Balance to >/=52/56 to be categorized as a low fall risk Baseline:  Goal status: INITIAL  5.  Pt will have improved DGI to >/=20/24 for low fall risk Baseline:  Goal status: INITIAL  6.  Pt will be  able to ascend/descend 12 steps with normal reciprocal pattern without hand holds for improved entry/exit of home Baseline:  Goal status: INITIAL  ASSESSMENT:  CLINICAL IMPRESSION: James Ferrell continues to display improvements in strength and gait quality. Able to progress to single leg sit to stand and eccentrics with UE support today. Continuing to encourage ankle dorsiflexion as pt is getting more activation here.   From eval: Patient is a 47 y.o. M who was seen today for physical therapy evaluation and treatment for subacute L CVA in June 2024. Pt has recently ended his home health therapy. Assessment significant for R LE weakness, decreased balance and gait instability affecting safety with home and community tasks limiting participation with return to work. Pt is able to obtain ankle DF in sitting; however, unable to activate these muscles in standing or supine. May require AFO pending on if pt is able to get ankle DF. May trial toe-up brace next  session to work on normalizing gait. Pt would highly benefit from PT to address these deficits to improve his level of function and decrease fall risk.   OBJECTIVE IMPAIRMENTS: Abnormal gait, decreased activity tolerance, decreased balance, decreased coordination, decreased endurance, decreased mobility, difficulty walking, decreased strength, and improper body mechanics.   PLAN:  PT FREQUENCY: 2x/week  PT DURATION: 8 weeks  PLANNED INTERVENTIONS: 97146- PT Re-evaluation, 97110-Therapeutic exercises, 97530- Therapeutic activity, [id number]- Neuromuscular re-education, 97535- Self Care, 40981- Manual therapy, [phone]- Gait training, [phone]- Orthotic Fit/training, [phone]- Aquatic Therapy, [phone]- Ionotophoresis 4mg /ml Dexamethasone, Patient/Family education, Balance training, Stair training, Taping, Cryotherapy, and Moist heat  PLAN FOR NEXT SESSION: Assess response to HEP. Lunges, stairs/steps, and eccentric quad control. Continue ankle strengthening. R LE  strengthening, balance, and coordination   Peniel Hass April Ma L Addison Freimuth, PT 11/30/2022, 2:39 PM

## 2022-12-05 ENCOUNTER — Ambulatory Visit: Payer: 59 | Admitting: Physical Therapy

## 2022-12-05 DIAGNOSIS — M6281 Muscle weakness (generalized): Secondary | ICD-10-CM | POA: Diagnosis not present

## 2022-12-05 DIAGNOSIS — R2689 Other abnormalities of gait and mobility: Secondary | ICD-10-CM

## 2022-12-05 DIAGNOSIS — R2681 Unsteadiness on feet: Secondary | ICD-10-CM

## 2022-12-05 NOTE — Therapy (Signed)
OUTPATIENT PHYSICAL THERAPY TREATMENT AND 10TH VISIT PROGRESS NOTE  Progress Note Reporting Period 11/02/22 to 12/05/2022   See note below for Objective Data and Assessment of Progress/Goals.     Patient Name: Fallon Salaiz MRN: [id number] DOB:1975-07-08, 47 y.o., male Today's Date: 12/05/2022   PCP: Lucienne Minks provider REFERRING PROVIDER: Idelia Salm, MD  END OF SESSION:  PT End of Session - 12/05/22 1354     Visit Number 10    Number of Visits 16    Date for PT Re-Evaluation 12/28/22    Authorization Type [employer]    Progress Note Due on Visit 20    PT Start Time 1355    PT Stop Time 1440    PT Time Calculation (min) 45 min    Activity Tolerance Patient tolerated treatment well    Behavior During Therapy [facility] for tasks assessed/performed                  Past Medical History:  Diagnosis Date   CHB (complete heart block) (HCC) 2020   Endocarditis of aortic valve    Feb 2020 and April 2021   H/O mechanical aortic valve replacement    HIV (human immunodeficiency virus infection) (HCC)    Stroke [facility])    Past Surgical History:  Procedure Laterality Date   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/21/2019   With root replacement   BENTALL PROCEDURE  07/21/2019   CARDIAC VALVE SURGERY     MECHANICAL AORTIC VALVE REPLACEMENT  07/21/2019   Redo of valve surgery   PACEMAKER IMPLANT     TEE WITHOUT CARDIOVERSION N/A 05/13/2019   Procedure: TRANSESOPHAGEAL ECHOCARDIOGRAM (TEE);  Surgeon: Laurier Nancy, MD;  Location: [facility] ORS;  Service: Cardiovascular;  Laterality: N/A;   TOTAL HIP ARTHROPLASTY Right 08/30/2019   Procedure: TOTAL HIP ARTHROPLASTY ANTERIOR APPROACH;  Surgeon: Tarry Kos, MD;  Location: [facility] OR;  Service: Orthopedics;  Laterality: Right;   Patient Active Problem List   Diagnosis Date Noted   Status post mechanical aortic valve replacement 08/30/2019   Closed displaced fracture of right femoral neck (HCC) 08/30/2019   HIV (human  immunodeficiency virus infection) (HCC) 05/20/2019   Bacteremia due to Staphylococcus aureus 05/10/2019   Sepsis (HCC) 05/09/2019   Leukocytosis 05/09/2019   Hypotension 05/09/2019   AKI (acute kidney injury) (HCC) 05/09/2019    ONSET DATE: June 2024  REFERRING DIAG: I63.9 (ICD-10-CM) - CVA (cerebral vascular accident)  THERAPY DIAG:  No diagnosis found.  Rationale for Evaluation and Treatment: Rehabilitation  SUBJECTIVE:  SUBJECTIVE STATEMENT: Pt reports he has been practicing and doing his exercises at home.   From eval: Pt reports he had a CVA this past June. Pt states his recovery has been going well. Pt states R LE is still weak. Pt states home health PT ended last week. Pt states balancing is still hard as well as going up/down the stairs. Wants to be able to walk better. Is interested in returning to work if able.  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   Per chart pt admit 6/4-6/12, went to inpatient rehab 6/12-6/28 for his CVA  PAIN:  Are you having pain? No  PRECAUTIONS: None  RED FLAGS: None   WEIGHT BEARING RESTRICTIONS: No  FALLS: Has patient fallen in last 6 months? No  LIVING ENVIRONMENT: Lives with: lives alone Lives in: House/apartment Stairs: Yes: External: 12 steps; bilateral but cannot reach both Has following equipment at home: None  PLOF:  Independent  PATIENT GOALS: Improve mobility for stairs and gait quality  OBJECTIVE:  Note: Objective measures were completed at Evaluation unless otherwise noted.  DIAGNOSTIC FINDINGS: CTA Head 06/26/22: Partially occlusive thrombus of the left distal M1 segment.   COORDINATION: Decreased on R due to strength deficits  MUSCLE TONE: RLE: +1 with knee and hip extension, multibeat myoclonus x 5 sec with R ankle DF  LOWER EXTREMITY ROM:     Active  Right Eval Left Eval  Hip flexion    Hip extension    Hip abduction    Hip adduction    Hip internal rotation    Hip external rotation    Knee flexion    Knee extension    Ankle dorsiflexion    Ankle plantarflexion    Ankle inversion    Ankle eversion     (Blank rows = not tested)  LOWER EXTREMITY MMT:    MMT Right Eval Left Eval  Hip flexion 5 5  Hip extension 4- 5  Hip abduction 4 4  Hip adduction    Hip internal rotation    Hip external rotation    Knee flexion 3+ 5  Knee extension 4- 5  Ankle dorsiflexion 3+ in seated unable in standing 5  Ankle plantarflexion 4 5  Ankle inversion    Ankle eversion    (Blank rows = not tested)  BED MOBILITY:  Independent  TRANSFERS: Independent  STAIRS: Level of Assistance: Modified independence Stair Negotiation Technique: Step to Pattern with Single Rail on Right Number of Stairs: 4  Height of Stairs: 6"  Comments: R LE circumduction to ascend, wide BOS  GAIT: Gait pattern: Decreased R ankle DF on heel strike (foot slaps) with poor toe off, R hip circumduction during swim with decreased knee extension, decreased R LE stance time, compensatory trunk lean to R during stance phase Distance walked: >200' Assistive device utilized: None Level of assistance: Complete Independence  FUNCTIONAL TESTS:  5x STS 12.97 sec with heavy weightshift to L LE Berg Balance Test: 47/56 DGI: 14/24     PATIENT SURVEYS:  FOTO 66; predicted 76  TODAY'S TREATMENT:  DATE:  12/05/22 Standing gastroc stretch on slant board x30 sec Standing soleus stretch on slant board x30 sec Single leg sit to stand 2x10 (elevated on airex) with UE assist Forward step up 2x10 8" step Side step up and over 2x10 8" step Forwards step down 2x10 6" step Forwards walking x 3 laps around gym. Focusing on R heel strike, R knee bend with toe off (pt tends to flex toes), L knee extension during stance phase to allow R knee to swing through  11/30/22 Standing gastroc stretch on slant board x1 min Standing soleus stretch on slant board x1 min Eccentric single leg stand to sit x10 with UE assist Single leg sit to stand 2x10 with UE assist Standing ankle DF 3x10 Heel/toe walking with targets for improved coordination Forwards walking x3 laps around gym working on keeping narrower BOS, equal step length, heel strike with toe cover for improved clearance during swing phase Backwards walking 4x40' working on hip and knee extension control as well as backwards weight shift   PATIENT EDUCATION: Education details: Exam findings, POC, initial HEP Person educated: Patient Education method: Explanation, Demonstration, and Handouts Education comprehension: verbalized understanding, returned demonstration, and needs further education  HOME EXERCISE PROGRAM: Access Code: [email or web address] URL: [email or web address] Date: 11/02/2022 Prepared by: Vernon Prey April Kirstie Peri  Exercises - Supine Active Straight Leg Raise  - 1 x daily - 7 x weekly - 3 sets - 10 reps - Supine Single Leg Ankle Pumps  - 1 x daily - 7 x weekly - 3 sets - 10 reps - Hooklying Single Knee to Chest Stretch  - 1 x daily - 7 x weekly - 3 sets - 10 reps - Single Leg Bridge  - 1 x daily - 7 x weekly - 3 sets - 10 reps - Sit to Stand with Armchair  - 1 x daily - 7 x weekly - 3 sets - 10 reps  GOALS: Goals  reviewed with patient? Yes  SHORT TERM GOALS: Target date: 11/30/2022   Pt will be ind with HEP Baseline: Goal status: PARTIALLY MET -- not consistent 11/30/22   2.  Pt will be able to perform ankle DF in standing for heel strike Baseline:  Goal status: MET 11/14/22    LONG TERM GOALS: Target date: 12/28/2022   Pt will be ind with management and progression of HEP Baseline:  Goal status: INITIAL  2.  Pt will be able to demonstrate decreased hip circumduction with improved heel strike with or without R AFO for improved gait stabilty x 1000' for limited community distances Baseline: foot slap/decreased heel strike and toe off, R hip circumduction, decreased knee flexion during swing Goal status: INITIAL  3.  Pt will have improved FOTO score to >/=76 Baseline: 66 Goal status: INITIAL  4.  Pt will demo improved Berg Balance to >/=52/56 to be categorized as a low fall risk Baseline:  Goal status: INITIAL  5.  Pt will have improved DGI to >/=20/24 for low fall risk Baseline:  Goal status: INITIAL  6.  Pt will be able to ascend/descend 12 steps with normal reciprocal pattern without hand holds for improved entry/exit of home Baseline:  Goal status: INITIAL  ASSESSMENT:  CLINICAL IMPRESSION: Session focused primarily on R LE strengthening and eccentric control. Continued work on decreasing pt's R ankle ER during toe off and improving heel strike/swing phase. Found that pt's R toes are flexing during toe off.   From eval: Patient is a 47 y.o. M who was  seen today for physical therapy evaluation and treatment for subacute L CVA in June 2024. Pt has recently ended his home health therapy. Assessment significant for R LE weakness, decreased balance and gait instability affecting safety with home and community tasks limiting participation with return to work. Pt is able to obtain ankle DF in sitting; however, unable to activate these muscles in standing or supine. May require AFO  pending on if pt is able to get ankle DF. May trial toe-up brace next session to work on normalizing gait. Pt would highly benefit from PT to address these deficits to improve his level of function and decrease fall risk.   OBJECTIVE IMPAIRMENTS: Abnormal gait, decreased activity tolerance, decreased balance, decreased coordination, decreased endurance, decreased mobility, difficulty walking, decreased strength, and improper body mechanics.   PLAN:  PT FREQUENCY: 2x/week  PT DURATION: 8 weeks  PLANNED INTERVENTIONS: 97146- PT Re-evaluation, 97110-Therapeutic exercises, 97530- Therapeutic activity, [id number]- Neuromuscular re-education, 97535- Self Care, 16109- Manual therapy, [phone]- Gait training, [phone]- Orthotic Fit/training, [phone]- Aquatic Therapy, [phone]- Ionotophoresis 4mg /ml Dexamethasone, Patient/Family education, Balance training, Stair training, Taping, Cryotherapy, and Moist heat  PLAN FOR NEXT SESSION: Assess response to HEP. Lunges, stairs/steps, and eccentric quad control. Gait training with/without drop foot support. Continue ankle strengthening. R LE strengthening, balance, and coordination   Esaiah Wanless April Ma L April Colter, PT 12/05/2022, 1:55 PM

## 2022-12-07 ENCOUNTER — Ambulatory Visit: Payer: 59 | Admitting: Physical Therapy

## 2022-12-07 VITALS — BP 122/80 | HR 92

## 2022-12-07 DIAGNOSIS — R2681 Unsteadiness on feet: Secondary | ICD-10-CM

## 2022-12-07 DIAGNOSIS — M6281 Muscle weakness (generalized): Secondary | ICD-10-CM

## 2022-12-07 DIAGNOSIS — R2689 Other abnormalities of gait and mobility: Secondary | ICD-10-CM

## 2022-12-07 NOTE — Therapy (Signed)
OUTPATIENT PHYSICAL THERAPY TREATMENT     Patient Name: James Ferrell MRN: [id number] DOB:10-10-1975, 47 y.o., male Today's Date: 12/07/2022   PCP: Lucienne Minks provider REFERRING PROVIDER: Idelia Salm, MD  END OF SESSION:  PT End of Session - 12/07/22 1404     Visit Number 11    Number of Visits 16    Date for PT Re-Evaluation 12/28/22    Authorization Type [employer] Medicare    Progress Note Due on Visit 20    PT Start Time 1403    PT Stop Time 1442    PT Time Calculation (min) 39 min    Activity Tolerance Patient tolerated treatment well    Behavior During Therapy [facility] for tasks assessed/performed                   Past Medical History:  Diagnosis Date   CHB (complete heart block) (HCC) 2020   Endocarditis of aortic valve    Feb 2020 and April 2021   H/O mechanical aortic valve replacement    HIV (human immunodeficiency virus infection) (HCC)    Stroke [facility])    Past Surgical History:  Procedure Laterality Date   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/21/2019   With root replacement   BENTALL PROCEDURE  07/21/2019   CARDIAC VALVE SURGERY     MECHANICAL AORTIC VALVE REPLACEMENT  07/21/2019   Redo of valve surgery   PACEMAKER IMPLANT     TEE WITHOUT CARDIOVERSION N/A 05/13/2019   Procedure: TRANSESOPHAGEAL ECHOCARDIOGRAM (TEE);  Surgeon: Laurier Nancy, MD;  Location: [facility] ORS;  Service: Cardiovascular;  Laterality: N/A;   TOTAL HIP ARTHROPLASTY Right 08/30/2019   Procedure: TOTAL HIP ARTHROPLASTY ANTERIOR APPROACH;  Surgeon: Tarry Kos, MD;  Location: [facility] OR;  Service: Orthopedics;  Laterality: Right;   Patient Active Problem List   Diagnosis Date Noted   Status post mechanical aortic valve replacement 08/30/2019   Closed displaced fracture of right femoral neck (HCC) 08/30/2019   HIV (human immunodeficiency virus infection) (HCC) 05/20/2019   Bacteremia due to Staphylococcus aureus 05/10/2019   Sepsis (HCC) 05/09/2019   Leukocytosis 05/09/2019    Hypotension 05/09/2019   AKI (acute kidney injury) (HCC) 05/09/2019    ONSET DATE: June 2024  REFERRING DIAG: I63.9 (ICD-10-CM) - CVA (cerebral vascular accident)  THERAPY DIAG:  Unsteadiness on feet  Other abnormalities of gait and mobility  Muscle weakness  Rationale for Evaluation and Treatment: Rehabilitation  SUBJECTIVE:  SUBJECTIVE STATEMENT: Pt reports doing well. Exercises are "okay". Denies pain or acute changes.   From eval: Pt reports he had a CVA this past June. Pt states his recovery has been going well. Pt states R LE is still weak. Pt states home health PT ended last week. Pt states balancing is still hard as well as going up/down the stairs. Wants to be able to walk better. Is interested in returning to work if able.  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   Per chart pt admit 6/4-6/12, went to inpatient rehab 6/12-6/28 for his CVA  PAIN:  Are you having pain? No  PRECAUTIONS: None  RED FLAGS: None   WEIGHT BEARING RESTRICTIONS: No  FALLS: Has patient fallen in last 6 months? No  LIVING ENVIRONMENT: Lives with: lives alone Lives in: House/apartment Stairs: Yes: External: 12 steps; bilateral but cannot reach both Has following equipment at home: None  PLOF: Independent  PATIENT GOALS: Improve mobility for stairs and gait quality  OBJECTIVE:  Note: Objective  measures were completed at Evaluation unless otherwise noted.  DIAGNOSTIC FINDINGS: CTA Head 06/26/22: Partially occlusive thrombus of the left distal M1 segment.   COORDINATION: Decreased on R due to strength deficits  MUSCLE TONE: RLE: +1 with knee and hip extension, multibeat myoclonus x 5 sec with R ankle DF  LOWER EXTREMITY ROM:     Active  Right Eval Left Eval  Hip flexion    Hip extension    Hip abduction    Hip adduction    Hip internal rotation    Hip external rotation    Knee flexion    Knee extension    Ankle dorsiflexion    Ankle plantarflexion    Ankle inversion    Ankle eversion     (Blank rows = not tested)  LOWER EXTREMITY MMT:    MMT Right Eval Left Eval  Hip flexion 5 5  Hip extension 4- 5  Hip abduction 4 4  Hip adduction    Hip internal rotation    Hip external rotation    Knee flexion 3+ 5  Knee extension 4- 5  Ankle dorsiflexion 3+ in seated unable in standing 5  Ankle plantarflexion 4 5  Ankle inversion    Ankle eversion    (Blank rows = not tested)  BED MOBILITY:  Independent  TRANSFERS: Independent  STAIRS: Level of Assistance: Modified independence Stair Negotiation Technique: Step to Pattern with Single Rail on Right Number of Stairs: 4  Height of Stairs: 6"  Comments: R LE circumduction to ascend, wide BOS  GAIT: Gait pattern: Decreased R ankle DF on heel strike (foot slaps) with poor toe off, R hip circumduction during swim with decreased knee extension, decreased R LE stance time, compensatory trunk lean to R during stance phase Distance walked: >200' Assistive device utilized: None Level of assistance: Complete Independence  FUNCTIONAL TESTS:  5x STS 12.97 sec with heavy weightshift to L LE Berg Balance Test: 47/56 DGI: 14/24     PATIENT SURVEYS:  FOTO 66; predicted 76  VITALS  Today's Vitals   12/07/22 1409  BP: 122/80  Pulse: 92  TODAY'S TREATMENT:              Ther Act  Assessed vitals (See above)  and WNL for therapy   Gait Training  Gait pattern:  ER of RLE, step through pattern, decreased stride length, decreased hip/knee flexion- Right, decreased ankle dorsiflexion- Right, Right hip hike, genu recurvatum- Right, lateral hip instability, lateral lean- Left, wide BOS, and poor foot clearance- Right Distance walked: 230'  Assistive device utilized: None Level of assistance: Modified independence Comments: w/Spry Step AFO (lateral strut) on RLE. Noted improved step clearance/length but continued use of adductors for swing phase of gait on RLE as well as vaulting on LLE. Pt did report feeling more energy efficient w/use of AFO.   Gait pattern: step through pattern, decreased hip/knee flexion- Right, decreased ankle dorsiflexion- Right, Left hip hike, genu recurvatum- Right, lateral hip instability, lateral lean- Left, and poor foot clearance- Right Distance walked: 460' Assistive device utilized: None Level of assistance: Modified independence Comments: [place] WalkOn AFO on RLE (medial strut). Noted improved facilitation of heel strike on RLE w/reduced ER and adductor compensation. Pt reporting preference for this AFO over the [place] Spry Step and the foot-up brace.   Discussed AFO vs Foot-up and pros/cons of both. Encouraged pt to think about what he would prefer, if he would like a brace, and report back next week. Pt aware that there is a financial obligation for both options. Pt inquiring about likelihood of him returning to PLOF both with and without a brace. Informed pt that he has motor return, which is a more favorable prognosis, but therapist unable to provide concrete answer. Pt verbalized understanding.    PATIENT EDUCATION: Education details: Continue HEP, think about bracing options  Person educated: Patient Education method: Explanation and Demonstration Education comprehension: verbalized understanding and needs further education  HOME EXERCISE PROGRAM: Access Code:  [email or web address] URL: [email or web address] Date: 11/02/2022 Prepared by: Vernon Prey April Kirstie Peri  Exercises - Supine Active Straight Leg Raise  - 1 x daily - 7 x weekly - 3 sets - 10 reps - Supine Single Leg Ankle Pumps  - 1 x daily - 7 x weekly - 3 sets - 10 reps - Hooklying Single Knee to Chest Stretch  - 1 x daily - 7 x weekly - 3 sets - 10 reps - Single Leg Bridge  - 1 x daily - 7 x weekly - 3 sets - 10 reps - Sit to Stand with Armchair  - 1 x daily - 7 x weekly - 3 sets - 10 reps  GOALS: Goals reviewed with patient? Yes  SHORT TERM GOALS: Target date: 11/30/2022   Pt will be ind with HEP Baseline: Goal status: PARTIALLY MET -- not consistent 11/30/22   2.  Pt will be able to perform ankle DF in standing for heel strike Baseline:  Goal status: MET 11/14/22    LONG TERM GOALS: Target date: 12/28/2022   Pt will be ind with management and progression of HEP Baseline:  Goal status: INITIAL  2.  Pt will be able to demonstrate decreased hip circumduction with improved heel strike with or without R AFO for improved gait stabilty x 1000' for limited community distances Baseline: foot slap/decreased heel strike and toe off, R hip circumduction, decreased knee flexion during swing Goal status: INITIAL  3.  Pt will have improved FOTO score to >/=76 Baseline: 66 Goal status: INITIAL  4.  Pt will demo improved Berg Balance to >/=52/56 to be categorized as a low  fall risk Baseline:  Goal status: INITIAL  5.  Pt will have improved DGI to >/=20/24 for low fall risk Baseline:  Goal status: INITIAL  6.  Pt will be able to ascend/descend 12 steps with normal reciprocal pattern without hand holds for improved entry/exit of home Baseline:  Goal status: INITIAL  ASSESSMENT:  CLINICAL IMPRESSION: Emphasis of skilled PT session on trialing AFOs on RLE to determine most energy efficient and least restrictive option to promote heel strike and hip/knee flexion w/gait. Pt  preferring the [facility] AFO this date and noted reduced adductor compensation w/use of medial strut. Pt to think about bracing options and report back next week. Continue POC.    OBJECTIVE IMPAIRMENTS: Abnormal gait, decreased activity tolerance, decreased balance, decreased coordination, decreased endurance, decreased mobility, difficulty walking, decreased strength, and improper body mechanics.   PLAN:  PT FREQUENCY: 2x/week  PT DURATION: 8 weeks  PLANNED INTERVENTIONS: 97146- PT Re-evaluation, 97110-Therapeutic exercises, 97530- Therapeutic activity, [id number]- Neuromuscular re-education, 97535- Self Care, 36644- Manual therapy, [phone]- Gait training, [phone]- Orthotic Fit/training, [phone]- Aquatic Therapy, [phone]- Ionotophoresis 4mg /ml Dexamethasone, Patient/Family education, Balance training, Stair training, Taping, Cryotherapy, and Moist heat  PLAN FOR NEXT SESSION: AFO? Assess response to HEP. Lunges, stairs/steps, and eccentric quad control. Gait training with/without drop foot support. Continue ankle strengthening. R LE strengthening, balance, and coordination   Harlynn Kimbell E Freddie Dymek, PT, DPT 12/07/2022, 2:44 PM

## 2022-12-12 ENCOUNTER — Ambulatory Visit: Payer: 59 | Admitting: Physical Therapy

## 2022-12-12 DIAGNOSIS — R2681 Unsteadiness on feet: Secondary | ICD-10-CM

## 2022-12-12 DIAGNOSIS — M6281 Muscle weakness (generalized): Secondary | ICD-10-CM

## 2022-12-12 DIAGNOSIS — R2689 Other abnormalities of gait and mobility: Secondary | ICD-10-CM

## 2022-12-12 NOTE — Therapy (Signed)
OUTPATIENT PHYSICAL THERAPY TREATMENT     Patient Name: James Ferrell MRN: [id number] DOB:[birth year], 47 y.o., male Today's Date: 12/12/2022   PCP: Lucienne Minks provider REFERRING PROVIDER: Idelia Salm, MD  END OF SESSION:  PT End of Session - 12/12/22 1408     Visit Number 12    Number of Visits 16    Date for PT Re-Evaluation 12/28/22    Authorization Type [employer] Medicare    Progress Note Due on Visit 20    PT Start Time 1405    PT Stop Time 1445    PT Time Calculation (min) 40 min    Activity Tolerance Patient tolerated treatment well    Behavior During Therapy [facility] for tasks assessed/performed              Past Medical History:  Diagnosis Date   CHB (complete heart block) (HCC) 2020   Endocarditis of aortic valve    Feb 2020 and April 2021   H/O mechanical aortic valve replacement    HIV (human immunodeficiency virus infection) (HCC)    Stroke [facility])    Past Surgical History:  Procedure Laterality Date   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/21/2019   With root replacement   BENTALL PROCEDURE  07/21/2019   CARDIAC VALVE SURGERY     MECHANICAL AORTIC VALVE REPLACEMENT  07/21/2019   Redo of valve surgery   PACEMAKER IMPLANT     TEE WITHOUT CARDIOVERSION N/A 05/13/2019   Procedure: TRANSESOPHAGEAL ECHOCARDIOGRAM (TEE);  Surgeon: Laurier Nancy, MD;  Location: [facility] ORS;  Service: Cardiovascular;  Laterality: N/A;   TOTAL HIP ARTHROPLASTY Right 08/30/2019   Procedure: TOTAL HIP ARTHROPLASTY ANTERIOR APPROACH;  Surgeon: Tarry Kos, MD;  Location: [facility] OR;  Service: Orthopedics;  Laterality: Right;   Patient Active Problem List   Diagnosis Date Noted   Status post mechanical aortic valve replacement 08/30/2019   Closed displaced fracture of right femoral neck (HCC) 08/30/2019   HIV (human immunodeficiency virus infection) (HCC) 05/20/2019   Bacteremia due to Staphylococcus aureus 05/10/2019   Sepsis (HCC) 05/09/2019   Leukocytosis 05/09/2019   Hypotension  05/09/2019   AKI (acute kidney injury) (HCC) 05/09/2019    ONSET DATE: June 2024  REFERRING DIAG: I63.9 (ICD-10-CM) - CVA (cerebral vascular accident)  THERAPY DIAG:  No diagnosis found.  Rationale for Evaluation and Treatment: Rehabilitation  SUBJECTIVE:                                                                                                                                                                                             SUBJECTIVE STATEMENT: Pt states he has been  able to go step over step on the stairs. Ordered the foot up brace and got it in the mail  From eval: Pt reports he had a CVA this past June. Pt states his recovery has been going well. Pt states R LE is still weak. Pt states home health PT ended last week. Pt states balancing is still hard as well as going up/down the stairs. Wants to be able to walk better. Is interested in returning to work if able.  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   Per chart pt admit 6/4-6/12, went to inpatient rehab 6/12-6/28 for his CVA  PAIN:  Are you having pain? No  PRECAUTIONS: None  RED FLAGS: None   WEIGHT BEARING RESTRICTIONS: No  FALLS: Has patient fallen in last 6 months? No  LIVING ENVIRONMENT: Lives with: lives alone Lives in: House/apartment Stairs: Yes: External: 12 steps; bilateral but cannot reach both Has following equipment at home: None  PLOF: Independent  PATIENT GOALS: Improve mobility for stairs and gait quality  OBJECTIVE:  Note: Objective measures were completed at Evaluation  unless otherwise noted.  DIAGNOSTIC FINDINGS: CTA Head 06/26/22: Partially occlusive thrombus of the left distal M1 segment.   COORDINATION: Decreased on R due to strength deficits  MUSCLE TONE: RLE: +1 with knee and hip extension, multibeat myoclonus x 5 sec with R ankle DF  LOWER EXTREMITY ROM:     Active  Right Eval Left Eval  Hip flexion    Hip extension    Hip abduction    Hip adduction    Hip internal rotation    Hip external rotation    Knee flexion    Knee extension    Ankle dorsiflexion    Ankle plantarflexion    Ankle inversion    Ankle eversion     (Blank rows = not tested)  LOWER EXTREMITY MMT:    MMT Right Eval Left Eval  Hip flexion 5 5  Hip extension 4- 5  Hip abduction 4 4  Hip adduction    Hip internal rotation    Hip external rotation    Knee flexion 3+ 5  Knee extension 4- 5  Ankle dorsiflexion 3+ in seated unable in standing 5  Ankle plantarflexion 4 5  Ankle inversion    Ankle eversion    (Blank rows = not tested)  BED MOBILITY:  Independent  TRANSFERS: Independent  STAIRS: Level of Assistance: Modified independence Stair Negotiation Technique: Step to Pattern with Single Rail on Right Number of Stairs: 4  Height of Stairs: 6"  Comments: R LE circumduction to ascend, wide BOS  GAIT: Gait pattern: Decreased R ankle DF on heel strike (foot slaps) with poor toe off, R hip circumduction during swim with decreased knee extension, decreased R LE stance time, compensatory trunk lean to R during stance phase Distance walked: >200' Assistive device utilized: None Level of assistance: Complete Independence  FUNCTIONAL TESTS:  5x STS 12.97 sec with heavy weightshift to L LE Berg Balance Test: 47/56 DGI: 14/24     PATIENT SURVEYS:  FOTO 66; predicted 76  VITALS  There were no vitals filed for this visit.  TODAY'S TREATMENT:       Gastroc stretch on slant board x30 sec Soleus stretch on slant borad x30 sec SLS 2x30" on  R Tandem stance x30", tandem stance with just toe touch for front leg x30" Elliptical 2 min forward, 2 min backward (still gets some clonus in R LE with increased time) Double leg, leg press x10 110#, single leg 2x8 110# Forward amb with foot up brace, working on heel strike and reducing hip circumduction/hiking Forward lunge 2x10   PATIENT EDUCATION: Education details: Continue HEP, think about bracing options  Person educated: Patient Education method: Explanation and Demonstration Education comprehension: verbalized understanding and needs further education  HOME EXERCISE PROGRAM: Access Code: [email or web address] URL: [email or web address] Date: 11/02/2022 Prepared by: Vernon Prey April Kirstie Peri  Exercises - Supine Active Straight Leg Raise  - 1 x daily - 7 x weekly - 3 sets - 10 reps - Supine Single Leg Ankle Pumps  - 1 x daily - 7 x weekly - 3 sets - 10 reps - Hooklying Single Knee to Chest Stretch  - 1 x daily - 7 x weekly - 3 sets - 10 reps - Single Leg Bridge  - 1 x daily - 7 x weekly - 3 sets - 10 reps - Sit to Stand with Armchair  - 1 x daily - 7 x weekly - 3 sets - 10 reps  GOALS: Goals reviewed with patient? Yes  SHORT TERM GOALS: Target date: 11/30/2022   Pt will be ind with HEP Baseline: Goal status: PARTIALLY MET -- not consistent 11/30/22   2.  Pt will be able to perform ankle DF in standing for heel strike Baseline:  Goal status: MET 11/14/22    LONG TERM GOALS: Target date: 12/28/2022   Pt will be ind with management and progression of HEP Baseline:  Goal status: INITIAL  2.  Pt will be able to demonstrate decreased hip circumduction with improved heel strike with or without R AFO for improved gait stabilty x 1000' for limited community distances Baseline: foot slap/decreased heel strike and toe off, R hip circumduction, decreased knee flexion during swing Goal status: INITIAL  3.  Pt will have improved FOTO score to >/=76 Baseline: 66 Goal  status: INITIAL  4.  Pt will demo improved Berg Balance to >/=52/56 to be categorized as a low fall risk Baseline:  Goal status: INITIAL  5.  Pt will have improved DGI to >/=20/24 for low fall risk Baseline:  Goal status: INITIAL  6.  Pt will be able to ascend/descend 12 steps with normal reciprocal pattern without hand holds for improved entry/exit of home Baseline:  Goal status: INITIAL  ASSESSMENT:  CLINICAL IMPRESSION: Pt states he has gotten a foot up brace. Will bring it in next session. Continued to work on R LE strengthening and stability. Able to perform lunges today with good stability. Tolerated increased weight on leg press today but fatigues. Still requires cueing for gait quality.   OBJECTIVE IMPAIRMENTS: Abnormal gait, decreased activity tolerance, decreased balance, decreased coordination, decreased endurance, decreased mobility, difficulty walking, decreased strength, and improper body mechanics.   PLAN:  PT FREQUENCY: 2x/week  PT DURATION: 8 weeks  PLANNED INTERVENTIONS: 97146- PT Re-evaluation, 97110-Therapeutic exercises, 97530- Therapeutic activity, [id number]- Neuromuscular re-education, 97535- Self Care, 30160- Manual therapy, [phone]- Gait training, [phone]- Orthotic Fit/training, [phone]- Aquatic Therapy, [phone]- Ionotophoresis 4mg /ml Dexamethasone, Patient/Family education, Balance training, Stair training, Taping, Cryotherapy, and Moist heat  PLAN FOR NEXT SESSION: AFO? Assess response to HEP. Lunges, stairs/steps, and  eccentric quad control. Gait training with/without drop foot support. Continue ankle strengthening. R LE strengthening, balance, and coordination   [facility] April Ma L Comfort, PT, DPT 12/12/2022, 2:09 PM

## 2022-12-14 ENCOUNTER — Ambulatory Visit: Payer: 59 | Admitting: Physical Therapy

## 2022-12-14 DIAGNOSIS — R2681 Unsteadiness on feet: Secondary | ICD-10-CM

## 2022-12-14 DIAGNOSIS — M6281 Muscle weakness (generalized): Secondary | ICD-10-CM

## 2022-12-14 DIAGNOSIS — R2689 Other abnormalities of gait and mobility: Secondary | ICD-10-CM

## 2022-12-14 NOTE — Therapy (Signed)
OUTPATIENT PHYSICAL THERAPY TREATMENT     Patient Name: James Ferrell MRN: [id number] DOB:16-Feb-1975, 47 y.o., male Today's Date: 12/14/2022   PCP: Lucienne Minks provider REFERRING PROVIDER: Idelia Salm, MD  END OF SESSION:  PT End of Session - 12/14/22 1359     Visit Number 13    Number of Visits 16    Date for PT Re-Evaluation 12/28/22    Authorization Type [employer] Medicare    Progress Note Due on Visit 20    PT Start Time 1400    PT Stop Time 1440    PT Time Calculation (min) 40 min    Activity Tolerance Patient tolerated treatment well    Behavior During Therapy [facility] for tasks assessed/performed               Past Medical History:  Diagnosis Date   CHB (complete heart block) (HCC) 2020   Endocarditis of aortic valve    Feb 2020 and April 2021   H/O mechanical aortic valve replacement    HIV (human immunodeficiency virus infection) (HCC)    Stroke [facility])    Past Surgical History:  Procedure Laterality Date   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/21/2019   With root replacement   BENTALL PROCEDURE  07/21/2019   CARDIAC VALVE SURGERY     MECHANICAL AORTIC VALVE REPLACEMENT  07/21/2019   Redo of valve surgery   PACEMAKER IMPLANT     TEE WITHOUT CARDIOVERSION N/A 05/13/2019   Procedure: TRANSESOPHAGEAL ECHOCARDIOGRAM (TEE);  Surgeon: Laurier Nancy, MD;  Location: [facility] ORS;  Service: Cardiovascular;  Laterality: N/A;   TOTAL HIP ARTHROPLASTY Right 08/30/2019   Procedure: TOTAL HIP ARTHROPLASTY ANTERIOR APPROACH;  Surgeon: Tarry Kos, MD;  Location: [facility] OR;  Service: Orthopedics;  Laterality: Right;   Patient Active Problem List   Diagnosis Date Noted   Status post mechanical aortic valve replacement 08/30/2019   Closed displaced fracture of right femoral neck (HCC) 08/30/2019   HIV (human immunodeficiency virus infection) (HCC) 05/20/2019   Bacteremia due to Staphylococcus aureus 05/10/2019   Sepsis (HCC) 05/09/2019   Leukocytosis 05/09/2019   Hypotension  05/09/2019   AKI (acute kidney injury) (HCC) 05/09/2019    ONSET DATE: June 2024  REFERRING DIAG: I63.9 (ICD-10-CM) - CVA (cerebral vascular accident)  THERAPY DIAG:  No diagnosis found.  Rationale for Evaluation and Treatment: Rehabilitation  SUBJECTIVE:                                                                                                                                                                                             SUBJECTIVE STATEMENT: Pt states he put  on his foot up brace but is not sure about if he has it on right.   From eval: Pt reports he had a CVA this past June. Pt states his recovery has been going well. Pt states R LE is still weak. Pt states home health PT ended last week. Pt states balancing is still hard as well as going up/down the stairs. Wants to be able to walk better. Is interested in returning to work if able.  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   Per chart pt admit 6/4-6/12, went to inpatient rehab 6/12-6/28 for his CVA  PAIN:  Are you having pain? No  PRECAUTIONS: None  RED FLAGS: None   WEIGHT BEARING RESTRICTIONS: No  FALLS: Has patient fallen in last 6 months? No  LIVING ENVIRONMENT: Lives with: lives alone Lives in: House/apartment Stairs: Yes: External: 12 steps; bilateral but cannot reach both Has following equipment at home: None  PLOF: Independent  PATIENT GOALS: Improve mobility for stairs and gait quality  OBJECTIVE:  Note: Objective measures were completed at Evaluation unless otherwise  noted.  DIAGNOSTIC FINDINGS: CTA Head 06/26/22: Partially occlusive thrombus of the left distal M1 segment.   COORDINATION: Decreased on R due to strength deficits  MUSCLE TONE: RLE: +1 with knee and hip extension, multibeat myoclonus x 5 sec with R ankle DF  LOWER EXTREMITY ROM:     Active  Right Eval Left Eval  Hip flexion    Hip extension    Hip abduction    Hip adduction    Hip internal rotation    Hip external rotation    Knee flexion    Knee extension    Ankle dorsiflexion    Ankle plantarflexion    Ankle inversion    Ankle eversion     (Blank rows = not tested)  LOWER EXTREMITY MMT:    MMT Right Eval Left Eval  Hip flexion 5 5  Hip extension 4- 5  Hip abduction 4 4  Hip adduction    Hip internal rotation    Hip external rotation    Knee flexion 3+ 5  Knee extension 4- 5  Ankle dorsiflexion 3+ in seated unable in standing 5  Ankle plantarflexion 4 5  Ankle inversion    Ankle eversion    (Blank rows = not tested)  BED MOBILITY:  Independent  TRANSFERS: Independent  STAIRS: Level of Assistance: Modified independence Stair Negotiation Technique: Step to Pattern with Single Rail on Right Number of Stairs: 4  Height of Stairs: 6"  Comments: R LE circumduction to ascend, wide BOS  GAIT: Gait pattern: Decreased R ankle DF on heel strike (foot slaps) with poor toe off, R hip circumduction during swim with decreased knee extension, decreased R LE stance time, compensatory trunk lean to R during stance phase Distance walked: >200' Assistive device utilized: None Level of assistance: Complete Independence  FUNCTIONAL TESTS:  5x STS 12.97 sec with heavy weightshift to L LE Berg Balance Test: 47/56 DGI: 14/24     PATIENT SURVEYS:  FOTO 66; predicted 76  VITALS  There were no vitals filed for this visit.   TODAY'S TREATMENT:  Leg press double leg x10 110#, single leg R only 2x10 at 110# Orthotic fit: how to don/doff foot up brace Forward  amb with foot up brace, working on heel strike and reducing hip circumduction/hiking Forward step lunge 2x10 Side step lunge 2x10 Tandem stance rebounder throwing green med ball x10 each direction Blaze pods x5, random light up step tap in different directions 3x 1 min R&L LE   PATIENT EDUCATION: Education details: Continue HEP, think about bracing options  Person educated: Patient Education method: Explanation and Demonstration Education comprehension: verbalized understanding and needs further education  HOME EXERCISE PROGRAM: Access Code: [email or web address] URL: [email or web address] Date: 11/02/2022 Prepared by: Vernon Prey April Kirstie Peri  Exercises - Supine Active Straight Leg Raise  - 1 x daily - 7 x weekly - 3 sets - 10 reps - Supine Single Leg Ankle Pumps  - 1 x daily - 7 x weekly - 3 sets - 10 reps - Hooklying Single Knee to Chest Stretch  - 1 x daily - 7 x weekly - 3 sets - 10 reps - Single Leg Bridge  - 1 x daily - 7 x weekly - 3 sets - 10 reps - Sit to Stand with Armchair  - 1 x daily - 7 x weekly - 3 sets - 10 reps  GOALS: Goals reviewed with patient? Yes  SHORT TERM GOALS: Target date: 11/30/2022   Pt will be ind with HEP Baseline: Goal status: PARTIALLY MET -- not consistent 11/30/22   2.  Pt will be able to perform ankle DF in standing for heel strike Baseline:  Goal status: MET 11/14/22    LONG TERM GOALS: Target date: 12/28/2022   Pt will be ind with management and progression of HEP Baseline:  Goal status: INITIAL  2.  Pt will be able to demonstrate decreased hip circumduction with improved heel strike with or without R AFO for improved gait stabilty x 1000' for limited community distances Baseline: foot slap/decreased heel strike and toe off, R hip circumduction, decreased knee flexion during swing Goal status: INITIAL  3.  Pt will have improved FOTO score to >/=76 Baseline: 66 Goal status: INITIAL  4.  Pt will demo improved Berg Balance to  >/=52/56 to be categorized as a low fall risk Baseline:  Goal status: INITIAL  5.  Pt will have improved DGI to >/=20/24 for low fall risk Baseline:  Goal status: INITIAL  6.  Pt will be able to ascend/descend 12 steps with normal reciprocal pattern without hand holds for improved entry/exit of home Baseline:  Goal status: INITIAL  ASSESSMENT:  CLINICAL IMPRESSION: Pt educated on how to don/doff his foot up brace. Able to demonstrate to PT. Rest of session continues to focus on gross R LE strengthening. Initiated higher level balance and stabilization exercises with good pt tolerance.    OBJECTIVE IMPAIRMENTS: Abnormal gait, decreased activity tolerance, decreased balance, decreased coordination, decreased endurance, decreased mobility, difficulty walking, decreased strength, and improper body mechanics.   PLAN:  PT FREQUENCY: 2x/week  PT DURATION: 8 weeks  PLANNED INTERVENTIONS: 97146- PT Re-evaluation, 97110-Therapeutic exercises, 97530- Therapeutic activity, [id number]- Neuromuscular re-education, 97535- Self Care, 08657- Manual therapy, [phone]- Gait training, [phone]- Orthotic Fit/training, [phone]- Aquatic Therapy, [phone]- Ionotophoresis 4mg /ml Dexamethasone, Patient/Family education, Balance training, Stair training, Taping, Cryotherapy, and Moist heat  PLAN FOR NEXT SESSION: Assess response to HEP. Lunges, stairs/steps, and eccentric quad control. Gait training with/without drop foot support. Continue ankle strengthening. R LE strengthening, balance, and coordination   Alistair Senft April Ma L  Lindey Renzulli, PT, DPT 12/14/2022, 2:00 PM

## 2022-12-19 ENCOUNTER — Ambulatory Visit: Payer: 59 | Admitting: Physical Therapy

## 2022-12-19 DIAGNOSIS — R2689 Other abnormalities of gait and mobility: Secondary | ICD-10-CM

## 2022-12-19 DIAGNOSIS — R2681 Unsteadiness on feet: Secondary | ICD-10-CM

## 2022-12-19 DIAGNOSIS — M6281 Muscle weakness (generalized): Secondary | ICD-10-CM | POA: Diagnosis not present

## 2022-12-19 NOTE — Therapy (Signed)
OUTPATIENT PHYSICAL THERAPY TREATMENT     Patient Name: James Ferrell MRN: [id number] DOB:07-06-1975, 47 y.o., male Today's Date: 12/19/2022   PCP: Lucienne Minks provider REFERRING PROVIDER: Idelia Salm, MD  END OF SESSION:  PT End of Session - 12/19/22 1414     Visit Number 14    Number of Visits 16    Date for PT Re-Evaluation 12/28/22    Authorization Type [employer] Medicare    Progress Note Due on Visit 20    PT Start Time 1414    PT Stop Time 1455    PT Time Calculation (min) 41 min    Activity Tolerance Patient tolerated treatment well    Behavior During Therapy [facility] for tasks assessed/performed                Past Medical History:  Diagnosis Date   CHB (complete heart block) (HCC) 2020   Endocarditis of aortic valve    Feb 2020 and April 2021   H/O mechanical aortic valve replacement    HIV (human immunodeficiency virus infection) (HCC)    Stroke [facility])    Past Surgical History:  Procedure Laterality Date   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/21/2019   With root replacement   BENTALL PROCEDURE  07/21/2019   CARDIAC VALVE SURGERY     MECHANICAL AORTIC VALVE REPLACEMENT  07/21/2019   Redo of valve surgery   PACEMAKER IMPLANT     TEE WITHOUT CARDIOVERSION N/A 05/13/2019   Procedure: TRANSESOPHAGEAL ECHOCARDIOGRAM (TEE);  Surgeon: Laurier Nancy, MD;  Location: [facility] ORS;  Service: Cardiovascular;  Laterality: N/A;   TOTAL HIP ARTHROPLASTY Right 08/30/2019   Procedure: TOTAL HIP ARTHROPLASTY ANTERIOR APPROACH;  Surgeon: Tarry Kos, MD;  Location: [facility] OR;  Service: Orthopedics;  Laterality: Right;   Patient Active Problem List   Diagnosis Date Noted   Status post mechanical aortic valve replacement 08/30/2019   Closed displaced fracture of right femoral neck (HCC) 08/30/2019   HIV (human immunodeficiency virus infection) (HCC) 05/20/2019   Bacteremia due to Staphylococcus aureus 05/10/2019   Sepsis (HCC) 05/09/2019   Leukocytosis 05/09/2019    Hypotension 05/09/2019   AKI (acute kidney injury) (HCC) 05/09/2019    ONSET DATE: June 2024  REFERRING DIAG: I63.9 (ICD-10-CM) - CVA (cerebral vascular accident)  THERAPY DIAG:  No diagnosis found.  Rationale for Evaluation and Treatment: Rehabilitation  SUBJECTIVE:                                                                                                                                                                                             SUBJECTIVE STATEMENT: Pt reports he's  been practicing walking with his foot brace.   From eval: Pt reports he had a CVA this past June. Pt states his recovery has been going well. Pt states R LE is still weak. Pt states home health PT ended last week. Pt states balancing is still hard as well as going up/down the stairs. Wants to be able to walk better. Is interested in returning to work if able.  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   Per chart pt admit 6/4-6/12, went to inpatient rehab 6/12-6/28 for his CVA  PAIN:  Are you having pain? No  PRECAUTIONS: None  RED FLAGS: None   WEIGHT BEARING RESTRICTIONS: No  FALLS: Has patient fallen in last 6 months? No  LIVING ENVIRONMENT: Lives with: lives alone Lives in: House/apartment Stairs: Yes: External: 12 steps; bilateral but cannot reach both Has following equipment at home: None  PLOF: Independent  PATIENT GOALS: Improve mobility for stairs and gait quality  OBJECTIVE:  Note: Objective measures were completed at Evaluation unless otherwise noted.  DIAGNOSTIC  FINDINGS: CTA Head 06/26/22: Partially occlusive thrombus of the left distal M1 segment.   COORDINATION: Decreased on R due to strength deficits  MUSCLE TONE: RLE: +1 with knee and hip extension, multibeat myoclonus x 5 sec with R ankle DF  LOWER EXTREMITY ROM:     Active  Right Eval Left Eval  Hip flexion    Hip extension    Hip abduction    Hip adduction    Hip internal rotation    Hip external rotation    Knee flexion    Knee extension    Ankle dorsiflexion    Ankle plantarflexion    Ankle inversion    Ankle eversion     (Blank rows = not tested)  LOWER EXTREMITY MMT:    MMT Right Eval Left Eval  Hip flexion 5 5  Hip extension 4- 5  Hip abduction 4 4  Hip adduction    Hip internal rotation    Hip external rotation    Knee flexion 3+ 5  Knee extension 4- 5  Ankle dorsiflexion 3+ in seated unable in standing 5  Ankle plantarflexion 4 5  Ankle inversion    Ankle eversion    (Blank rows = not tested)  BED MOBILITY:  Independent  TRANSFERS: Independent  STAIRS: Level of Assistance: Modified independence Stair Negotiation Technique: Step to Pattern with Single Rail on Right Number of Stairs: 4  Height of Stairs: 6"  Comments: R LE circumduction to ascend, wide BOS  GAIT: Gait pattern: Decreased R ankle DF on heel strike (foot slaps) with poor toe off, R hip circumduction during swim with decreased knee extension, decreased R LE stance time, compensatory trunk lean to R during stance phase Distance walked: >200' Assistive device utilized: None Level of assistance: Complete Independence  FUNCTIONAL TESTS:  5x STS 12.97 sec with heavy weightshift to L LE Berg Balance Test: 47/56 DGI: 14/24     PATIENT SURVEYS:  FOTO 66; predicted 76  VITALS  There were no vitals filed for this visit.   TODAY'S TREATMENT:       Standing gastroc stretch x30  sec Standing soleus stretch x 30 sec Leg press double leg x10 120#; single 2x10 120# Side step lunge  2x10 Forward amb with foot up brace, working on heel strike and reducing hip circumduction/hiking With foot up brace, working on knee flexion/toe off without IR [place] 2x10 Blaze pods x5, random light up step tap in different directions 3x 1 min R&L LE     PATIENT EDUCATION: Education details: Continue HEP, think about bracing options  Person educated: Patient Education method: Explanation and Demonstration Education comprehension: verbalized understanding and needs further education  HOME EXERCISE PROGRAM: Access Code: [email or web address] URL: [email or web address] Date: 11/02/2022 Prepared by: Vernon Prey April Kirstie Peri  Exercises - Supine Active Straight Leg Raise  - 1 x daily - 7 x weekly - 3 sets - 10 reps - Supine Single Leg Ankle Pumps  - 1 x daily - 7 x weekly - 3 sets - 10 reps - Hooklying Single Knee to Chest Stretch  - 1 x daily - 7 x weekly - 3 sets - 10 reps - Single Leg Bridge  - 1 x daily - 7 x weekly - 3 sets - 10 reps - Sit to Stand with Armchair  - 1 x daily - 7 x weekly - 3 sets - 10 reps  GOALS: Goals reviewed with patient? Yes  SHORT TERM GOALS: Target date: 11/30/2022   Pt will be ind with HEP Baseline: Goal status: PARTIALLY MET -- not consistent 11/30/22   2.  Pt will be able to perform ankle DF in standing for heel strike Baseline:  Goal status: MET 11/14/22    LONG TERM GOALS: Target date: 12/28/2022   Pt will be ind with management and progression of HEP Baseline:  Goal status: INITIAL  2.  Pt will be able to demonstrate decreased hip circumduction with improved heel strike with or without R AFO for improved gait stabilty x 1000' for limited community distances Baseline: foot slap/decreased heel strike and toe off, R hip circumduction, decreased knee flexion during swing Goal status: INITIAL  3.  Pt will have improved FOTO score to >/=76 Baseline: 66 Goal status: INITIAL  4.  Pt will demo improved Berg Balance to >/=52/56 to be  categorized as a low fall risk Baseline:  Goal status: INITIAL  5.  Pt will have improved DGI to >/=20/24 for low fall risk Baseline:  Goal status: INITIAL  6.  Pt will be able to ascend/descend 12 steps with normal reciprocal pattern without hand holds for improved entry/exit of home Baseline:  Goal status: INITIAL  ASSESSMENT:  CLINICAL IMPRESSION: Briefly reviewed donning/doffing foot up brace. Continued R LE strengthening. Working on improving multidirectional weight shifting and balance. Continuing to work on improving gait quality as pt tends to internally rotate during swing phase. Pt continues to demonstrate improving R LE weight shift and strength on leg press.   OBJECTIVE IMPAIRMENTS: Abnormal gait, decreased activity tolerance, decreased balance, decreased coordination, decreased endurance, decreased mobility, difficulty walking, decreased strength, and improper body mechanics.   PLAN:  PT FREQUENCY: 2x/week  PT DURATION: 8 weeks  PLANNED INTERVENTIONS: 97146- PT Re-evaluation, 97110-Therapeutic exercises, 97530- Therapeutic activity, [id number]- Neuromuscular re-education, 97535- Self Care, 54098- Manual therapy, [phone]- Gait training, [phone]- Orthotic Fit/training, [phone]- Aquatic Therapy, [phone]- Ionotophoresis 4mg /ml Dexamethasone, Patient/Family education, Balance training, Stair training, Taping, Cryotherapy, and Moist heat  PLAN FOR NEXT SESSION: Assess response to HEP. Lunges, multidirectional stepping/weight shifting, stairs/steps, and eccentric quad control. Gait training with/without drop foot support. Continue ankle strengthening. R LE strengthening,  balance, and coordination   Alejandra Hunt April Ma L Morton, PT, DPT 12/19/2022, 2:17 PM

## 2022-12-21 ENCOUNTER — Ambulatory Visit: Payer: 59 | Admitting: Physical Therapy

## 2022-12-24 ENCOUNTER — Ambulatory Visit: Admit: 2022-12-24 | Discharge: 2022-12-24 | Payer: MEDICARE

## 2022-12-24 DIAGNOSIS — Z952 Presence of prosthetic heart valve: Principal | ICD-10-CM

## 2022-12-24 DIAGNOSIS — Z8673 Personal history of transient ischemic attack (TIA), and cerebral infarction without residual deficits: Principal | ICD-10-CM

## 2022-12-24 DIAGNOSIS — Z79899 Other long term (current) drug therapy: Principal | ICD-10-CM

## 2022-12-24 DIAGNOSIS — B20 Human immunodeficiency virus [HIV] disease: Principal | ICD-10-CM

## 2022-12-24 DIAGNOSIS — E559 Vitamin D deficiency, unspecified: Principal | ICD-10-CM

## 2022-12-24 DIAGNOSIS — F22 Delusional disorders: Principal | ICD-10-CM

## 2022-12-24 DIAGNOSIS — Z5181 Encounter for therapeutic drug level monitoring: Principal | ICD-10-CM

## 2022-12-24 DIAGNOSIS — Z21 Asymptomatic human immunodeficiency virus [HIV] infection status: Principal | ICD-10-CM

## 2022-12-24 LAB — CBC W/ AUTO DIFF
BASOPHILS ABSOLUTE COUNT: 0.1 10*9/L (ref 0.0–0.1)
BASOPHILS RELATIVE PERCENT: 0.8 %
EOSINOPHILS ABSOLUTE COUNT: 0.1 10*9/L (ref 0.0–0.5)
EOSINOPHILS RELATIVE PERCENT: 1.6 %
HEMATOCRIT: 46.2 % (ref 39.0–48.0)
HEMOGLOBIN: 14.9 g/dL (ref 12.9–16.5)
LYMPHOCYTES ABSOLUTE COUNT: 2.1 10*9/L (ref 1.1–3.6)
LYMPHOCYTES RELATIVE PERCENT: 28.6 %
MEAN CORPUSCULAR HEMOGLOBIN CONC: 32.3 g/dL (ref 32.0–36.0)
MEAN CORPUSCULAR HEMOGLOBIN: 29.7 pg (ref 25.9–32.4)
MEAN CORPUSCULAR VOLUME: 92.2 fL (ref 77.6–95.7)
MEAN PLATELET VOLUME: 8.2 fL (ref 6.8–10.7)
MONOCYTES ABSOLUTE COUNT: 0.6 10*9/L (ref 0.3–0.8)
MONOCYTES RELATIVE PERCENT: 8.9 %
NEUTROPHILS ABSOLUTE COUNT: 4.4 10*9/L (ref 1.8–7.8)
NEUTROPHILS RELATIVE PERCENT: 60.1 %
PLATELET COUNT: 188 10*9/L (ref 150–450)
RED BLOOD CELL COUNT: 5.01 10*12/L (ref 4.26–5.60)
RED CELL DISTRIBUTION WIDTH: 13 % (ref 12.2–15.2)
WBC ADJUSTED: 7.2 10*9/L (ref 3.6–11.2)

## 2022-12-24 LAB — LYMPH MARKER LIMITED,FLOW
ABSOLUTE CD3 CNT: 1386 {cells}/uL (ref 915–3400)
ABSOLUTE CD4 CNT: 504 {cells}/uL — ABNORMAL LOW (ref 510–2320)
ABSOLUTE CD8 CNT: 840 {cells}/uL (ref 180–1520)
CD3% (T CELLS): 66 % (ref 61–86)
CD4% (T HELPER): 24 % — ABNORMAL LOW (ref 34–58)
CD4:CD8 RATIO: 0.6 — ABNORMAL LOW (ref 0.9–4.8)
CD8% T SUPPRESR: 40 % — ABNORMAL HIGH (ref 12–38)

## 2022-12-24 LAB — SLIDE REVIEW

## 2022-12-24 MED ORDER — WARFARIN 5 MG TABLET
ORAL_TABLET | 3 refills | 0 days | Status: CP
Start: 2022-12-24 — End: ?
  Filled 2023-01-04: qty 90, 79d supply, fill #0

## 2022-12-24 NOTE — Unmapped (Addendum)
Thank you for coming today!  You have a follow up appointment with ID on 02/18/23 at 11am     Increase warfarin dose to 10 mg on Mondays, then can take 5 mg on the other days. Please take this medication EVERY DAY.  Recommend placing your medicines in your room on your night stand, or next to your toothbrush.    Placed referral to occupational therapy, and psychiatry in [place]

## 2022-12-24 NOTE — Unmapped (Signed)
Internal Medicine Clinic Visit    Reason for visit: Follow up    A/P:      Rhylen was seen today for follow-up.    Diagnoses and all orders for this visit:    History of CVA (cerebrovascular accident)  Walking better after doing PT. Would like to try OT to improve R arm/hand strength.  -     Ambulatory referral to Occupational Therapy; Future - [facility]    History of prosthetic aortic valve  On average misses warfarin once a week. INR 1.0 today so wonder if he is missing more doses than that. Discussed importance of adherence to this today.  -     warfarin (JANTOVEN) 5 MG tablet; Take 5 mg daily, except on Mondays (take 10 mg)    Vitamin D deficiency  He was prescribed high dose supplementation a few months ago, unclear if it was taken.  -     Vitamin D 25 Hydroxy (25OH D2 + D3)  -     ergocalciferol-1,250 mcg, 50,000 unit, (DRISDOL) 1,250 mcg (50,000 unit) capsule; Take 1 capsule (1,250 mcg total) by mouth once a week for 8 doses.    Paranoia (CMS-HCC)  Ongoing issue. He would like to stay on risperidone. Open to seeing psychiatry - prefers Black male provider.  -     Ambulatory referral to Psychiatry; Future - [facility] in [place]    HIV disease (CMS-HCC)  Therapeutic drug monitoring  On highly active antiretroviral therapy (HAART)  Follows w/ ID.  -     Lymphocyte Markers Limited  -     HIV RNA, Quantitative, PCR  -     CBC w/ Differential    Other orders  -     POCT PT/INR        Return in about 3 months (around 03/24/2023).      __________________________________________________________    HPI:    Last visit 8/22. Last saw ID 9/19.    Overall doing well  Is almost done with 6 weeks of PT. Is walking better  No falls recently    Doesn't think he needs refills  Misses meds on average 1-2 times a week  No missed doses of biktarvy    Thinks he missed coumadin 2 times in the past two weeks  No bleeding issues    No CP or SOB  No LH or DZ  No abdominal pain, nausea, vomiting  Normal BM    Going to sleep later than usual. Listens to radio  Sleeps 5 hours a night on average  ________________________________________________________    Problem List:  Patient Active Problem List   Diagnosis    History of prosthetic aortic valve    HIV (human immunodeficiency virus infection) (CMS-HCC)    Tobacco use disorder    AKI (acute kidney injury) (CMS-HCC)    Severe aortic insufficiency    HLD (hyperlipidemia)    Ischemic stroke (CMS-HCC)    Status post mechanical aortic valve replacement    Hx of repair of aortic root    Complete heart block (CMS-HCC)    Closed displaced fracture of right femoral neck (CMS-HCC)    Aortic valve endocarditis    Psychosis (CMS-HCC)    HFrEF (heart failure with reduced ejection fraction) (CMS-HCC)       Medications:  Reviewed in EPIC  __________________________________________________________    Physical Exam:   Vital Signs:  Vitals:    12/24/22 1445   BP: 115/72   BP Site:  L Arm   BP Position: Sitting   BP Cuff Size: Medium   Pulse: 93   Temp: 36.7 ??C (98 ??F)   TempSrc: Oral   SpO2: 97%   Weight: 96.7 kg (213 lb 2 oz)   Height: 182.9 cm (6')       Gen: Well appearing male sitting in chair in NAD.  HEENT: [address]/AT. Sclera anicteric.   CV: RRR, no murmurs appreciated.  Pulm: Normal WOB. CTAB.  Abd: Soft, NTND.  Neuro: 5/5 strength in RUE except grip strength.  Ext: No edema      Medication adherence and barriers to the treatment plan have been addressed. Opportunities to optimize healthy behaviors have been discussed. Patient / caregiver voiced understanding.        Lala Lund, MD

## 2022-12-24 NOTE — Unmapped (Signed)
[facility] at [facility]     Reason for visit: Follow up    Questions / Concerns that need to be addressed: ***    Screening BP- 115/72  HR 93        HCDM reviewed and updated in Epic:    We are working to make sure all of our patients??? wishes are updated in Epic and part of that is documenting a Environmental health practitioner for each patient  A Health Care Decision Maker is someone you choose who can make health care decisions for you if you are not able - who would you most want to do this for you????  is already up to date.    HCDM (patient stated preference): [place],[employer] - Other - [phone]

## 2022-12-25 LAB — HIV RNA, QUANTITATIVE, PCR
HIV RNA QNT RSLT: DETECTED — AB
HIV RNA: <20 {copies}/mL — ABNORMAL HIGH (ref <0)

## 2022-12-26 ENCOUNTER — Ambulatory Visit: Payer: 59 | Admitting: Physical Therapy

## 2022-12-26 NOTE — Unmapped (Signed)
[facility] Nursing Attempt    TYPE OF ENCOUNTER: Text     REASON: Follow Up    ADHERENCE: Not assessed at this time     ASSESSMENT: Please see the text encounter below:    RN: Hello, I received a new work phone, so if you sent me any messages on Friday I would not have seen them. Please let me know if there is something that you need, that I may not have responded to on Friday.     No response from the patient    INTERVENTION: None at this time.    PLAN: RN will continue to follow up with the patient and try to engage  in care.     DURATION: 1 min

## 2022-12-26 NOTE — Unmapped (Signed)
[facility] Nursing Attempt      TYPE OF ENCOUNTER: Text     REASON: Follow Up     ADHERENCE: Assessed     ASSESSMENT: Please see the text encounter below:    RN: Remi Haggard, the pharmacy is trying to get in touch with you to set up delivery of Biktarvy. Have you had any missed doses in the last month?    INTERVENTION: RN tried to contact the patient about medication delivery, but there was no response.     PLAN: RN will continue to try and follow up    DURATION: 5 min

## 2022-12-26 NOTE — Unmapped (Signed)
[facility] Nursing Attempt    TYPE OF ENCOUNTER: Text     REASON: Follow up     ADHERENCE: Not assessed at this time     ASSESSMENT: RN sent the patient a text to follow up:    RN: [name]. I hope everything is ok.. I haven't heard from you in a while.     INTERVENTION: N/A    PLAN: N/A    DURATION:  5 min

## 2022-12-26 NOTE — Unmapped (Signed)
[facility] Nursing Note     TYPE OF ENCOUNTER:  Text     REASON: Follow Up     ADHERENCE: Not assessed at this time     ASSESSMENT: Please see the text encounter below:    RN:Hey Holten, its Lismary Kiehn. Been a while since I heard from you. is everything ok?    No response from the patient     INTERVENTION: N/A    PLAN: N/A    DURATION: 5 min

## 2022-12-26 NOTE — Unmapped (Addendum)
[facility] Nursing Note    TYPE OF ENCOUNTER: Text     REASON: Follow Up/ Medication Access    ADHERENCE: Not assessed at this time.     ASSESSMENT: Pt. Attended his appointment with the internal medicine clinic on 12/3. RN reached out to the patient's primary care provider during his visit, and asked to verify the patients address and phone number for the pharmacy. RN explained to the provider that the pharmacy had been trying to reach the patient to set up medication delivery. Pt. Stated that he did not need any refills at the time. RN reached out today to follow up. Please see the text encounter below:    RN: [name]! How are you doing?  CT: Hi Payton Moder. I'm good. How are you doing?  RN:Im ok, just making sure you were ok. I havent heard from you in a while.   CT:I can't complain. I was up there yesterday, and I was going to stop by, but the labs were busy so I left after that.  RN:Ok, have you been able to get your meds? Greg from the pharmacy had been trying to reach you.  CT: I'm going to call him. Thank you  RN: Rip Harbour, how have you been feeling? Emotionally and physically?  VW:[id number] good. I'm feeling ok.     INTERVENTION: RN contacted the patient to have him reach out to the pharmacy for refills.     PLAN: RN will continue to try and follow up with the patient     DURATION: 10 min

## 2022-12-27 LAB — VITAMIN D 25 HYDROXY: VITAMIN D, TOTAL (25OH): 7.5 ng/mL — ABNORMAL LOW (ref 20.0–80.0)

## 2022-12-27 MED ORDER — ERGOCALCIFEROL (VITAMIN D2) 1,250 MCG (50,000 UNIT) CAPSULE
ORAL_CAPSULE | ORAL | 0 refills | 56 days | Status: CP
Start: 2022-12-27 — End: 2023-02-15

## 2022-12-28 ENCOUNTER — Ambulatory Visit: Payer: 59 | Attending: Internal Medicine | Admitting: Physical Therapy

## 2022-12-28 ENCOUNTER — Telehealth: Payer: Self-pay | Admitting: Physical Therapy

## 2022-12-28 DIAGNOSIS — R2681 Unsteadiness on feet: Secondary | ICD-10-CM | POA: Insufficient documentation

## 2022-12-28 DIAGNOSIS — R2689 Other abnormalities of gait and mobility: Secondary | ICD-10-CM | POA: Insufficient documentation

## 2022-12-28 DIAGNOSIS — R278 Other lack of coordination: Secondary | ICD-10-CM | POA: Insufficient documentation

## 2022-12-28 DIAGNOSIS — M6281 Muscle weakness (generalized): Secondary | ICD-10-CM | POA: Insufficient documentation

## 2022-12-28 NOTE — Telephone Encounter (Signed)
Called pt in regards to 1st missed appointment today 12/28/22. Able to leave voicemail stating he has no other visits scheduled; however, to give the office a call to schedule more.   James Ferrell April Ma Alphonsa Overall, PT

## 2023-01-03 NOTE — Unmapped (Signed)
[facility] Clinical Assessment & Refill Coordination Note    Warren [name]., DOB: October 18, 1975  Phone: [phone] (home)     All above HIPAA information was verified with patient.     Was a Nurse, learning disability used for this call? No    Specialty Medication(s):   Infectious Disease: Biktarvy     Current Outpatient Medications   Medication Sig Dispense Refill    aspirin 81 MG chewable tablet Chew 1 tablet (81 mg total) daily. 90 tablet 3    atorvastatin (LIPITOR) 80 MG tablet Take 1 tablet (80 mg total) by mouth daily. 90 tablet 3    bictegrav-emtricit-tenofov ala (BIKTARVY) 50-200-25 mg tablet Take 1 tablet by mouth daily. 30 tablet 11    ergocalciferol-1,250 mcg, 50,000 unit, (DRISDOL) 1,250 mcg (50,000 unit) capsule Take 1 capsule (1,250 mcg total) by mouth once a week for 8 doses. 8 capsule 0    lisinopril (PRINIVIL,ZESTRIL) 5 MG tablet Take 1 tablet (5 mg total) by mouth daily. 90 tablet 3    metoPROLOL succinate (TOPROL-XL) 50 MG 24 hr tablet Take 1 tablet (50 mg total) by mouth daily. 90 tablet 3    risperiDONE (RISPERDAL) 1 MG tablet Take 1 tablet (1 mg total) by mouth nightly. (Patient not taking: Reported on 12/24/2022) 90 tablet 3    warfarin (JANTOVEN) 5 MG tablet Take 5 mg daily, except on Mondays (take 10 mg) 90 tablet 3     No current facility-administered medications for this visit.        Changes to medications: Dwij reports no changes at this time.    No Known Allergies    Changes to allergies: No    SPECIALTY MEDICATION ADHERENCE     Biktarvy 50-200-25 mg: ~7 days of medicine on hand       Medication Adherence    Patient reported X missed doses in the last month: 2  Specialty Medication: Biktarvy 50-200-25mg : He said I may have missed one or two  Patient is on additional specialty medications: No  Any gaps in refill history greater than 2 weeks in the last 3 months: yes  Demonstrates understanding of importance of adherence: yes  Informant: patient  Provider-estimated medication adherence level: variable  Patient is at risk for Non-Adherence: Yes  Confirmed plan for next specialty medication refill: delivery by pharmacy  Refills needed for supportive medications: not needed          Specialty medication(s) dose(s) confirmed: Regimen is correct and unchanged.     Are there any concerns with adherence? Yes: Large gap between fills and still has ~ 1 week left.    Adherence counseling provided?  No. He does well overall.  His labs look good.    CLINICAL MANAGEMENT AND INTERVENTION      Clinical Benefit Assessment:    Do you feel the medicine is effective or helping your condition? Yes    HIV ASSOCIATED LABS:     Lab Results   Component Value Date/Time    HIVRS Detected (A) 12/24/2022 03:57 PM    HIVRS Not Detected 10/11/2022 03:49 PM    HIVRS Detected (A) 09/13/2022 03:34 PM    HIVCP <20 (H) 12/24/2022 03:57 PM    HIVCP <20 (H) 09/13/2022 03:34 PM    HIVCP 11,694 (H) 06/26/2022 12:45 PM    ACD4 504 (L) 12/24/2022 03:57 PM    ACD4 374 (L) 09/13/2022 03:34 PM    ACD4 308 (L) 06/28/2022 05:51 AM  Clinical Benefit counseling provided? Labs from 12/24/22 show evidence of clinical benefit    Adverse Effects Assessment:    Are you experiencing any side effects? No    Are you experiencing difficulty administering your medicine? No    Quality of Life Assessment:    How many days over the past month did your HIV  keep you from your normal activities? For example, brushing your teeth or getting up in the morning. 0    Have you discussed this with your provider? Not needed    Acute Infection Status:    Acute infections noted within Epic:  No active infections  Patient reported infection: None    Therapy Appropriateness:    Is therapy appropriate based on current medication list, adverse reactions, adherence, clinical benefit and progress toward achieving therapeutic goals? Yes, therapy is appropriate and should be continued     DISEASE/MEDICATION-SPECIFIC INFORMATION      N/A    HIV: Not Applicable    PATIENT SPECIFIC NEEDS     Does the patient have any physical, cognitive, or cultural barriers? No    Is the patient high risk? No    Did the patient require a clinical intervention? No    Does the patient require physician intervention or other additional services (i.e., nutrition, smoking cessation, social work)? No    SOCIAL DETERMINANTS OF HEALTH     At the [facility], we have learned that life circumstances - like trouble affording food, housing, utilities, or transportation can affect the health of many of our patients.   That is why we wanted to ask: are you currently experiencing any life circumstances that are negatively impacting your health and/or quality of life? Patient declined to answer    Social Drivers of Health     Food Insecurity: No Food Insecurity (07/04/2022)    Hunger Vital Sign     Worried About Running Out of Food in the Last Year: Never true     Ran Out of Food in the Last Year: Never true   Internet Connectivity: Internet connectivity concern identified (07/04/2022)    Internet Connectivity     Do you have access to internet services: No     How do you connect to the internet: Personal Device at home     Is your internet connection strong enough for you to watch video on your device without major problems?: Yes     Do you have enough data to get through the month?: Yes     Does at least one of the devices have a camera that you can use for video chat?: Yes   Housing/Utilities: Low Risk  (07/04/2022)    Housing/Utilities     Within the past 12 months, have you ever stayed: outside, in a car, in a tent, in an overnight shelter, or temporarily in someone else's home (i.e. couch-surfing)?: No     Are you worried about losing your housing?: No     Within the past 12 months, have you been unable to get utilities (heat, electricity) when it was really needed?: No   Tobacco Use: High Risk (11/21/2022)    Received from [facility]    Patient History     Smoking Tobacco Use: Every Day     Smokeless Tobacco Use: Never     Passive Exposure: Not on file   Transportation Needs: No Transportation Needs (07/20/2022)    PRAPARE - Therapist, art (Medical): No  Lack of Transportation (Non-Medical): No   Alcohol Use: Not At Risk (03/07/2022)    Alcohol Use     How often do you have a drink containing alcohol?: 2 - 3 times per week     How many drinks containing alcohol do you have on a typical day when you are drinking?: 3 - 4     How often do you have 5 or more drinks on one occasion?: Never   Interpersonal Safety: Not At Risk (07/04/2022)    Interpersonal Safety     Unsafe Where You Currently Live: No     Physically Hurt by Anyone: No     Abused by Anyone: No   Physical Activity: Inactive (07/04/2022)    Exercise Vital Sign     Days of Exercise per Week: 0 days     Minutes of Exercise per Session: 0 min   Intimate Partner Violence: Not At Risk (07/04/2022)    Humiliation, Afraid, Rape, and Kick questionnaire     Fear of Current or Ex-Partner: No     Emotionally Abused: No     Physically Abused: No     Sexually Abused: No   Stress: Stress Concern Present (07/04/2022)    [employer] of Occupational Health - Occupational Stress Questionnaire     Feeling of Stress : To some extent   Substance Use: High Risk (07/04/2022)    Substance Use     In the past year, how often have you used prescription drugs for non-medical reasons?: Never     In the past year, how often have you used illegal drugs?: Monthly     In the past year, have you used any substance for non-medical reasons?: Yes   Social Connections: Moderately Isolated (07/04/2022)    Social Connection and Isolation Panel [NHANES]     Frequency of Communication with Friends and Family: Once a week     Frequency of Social Gatherings with Friends and Family: Once a week     Attends Religious Services: 1 to 4 times per year     Active Member of [employer] or Organizations: Yes     Attends Banker Meetings: Never Marital Status: Never married   Physicist, medical Strain: Low Risk  (07/04/2022)    Overall Financial Resource Strain (CARDIA)     Difficulty of Paying Living Expenses: Not hard at all   Depression: Not at risk (07/04/2022)    PHQ-2     PHQ-2 Score: 0   Health Literacy: Low Risk  (07/20/2022)    Health Literacy     : Never       Would you be willing to receive help with any of the needs that you have identified today? Not applicable       SHIPPING     Specialty Medication(s) to be Shipped:   Infectious Disease: Biktarvy    Other medication(s) to be shipped:  warfarin 5mg      Changes to insurance: No    Delivery Scheduled: Yes, Expected medication delivery date: 12/16 or 12/17.     Medication will be delivered via UPS to the confirmed prescription address in [facility].    The patient will receive a drug information handout for each medication shipped and additional FDA Medication Guides as required.  Verified that patient has previously received a Conservation officer, historic buildings and a Surveyor, mining.    The patient or caregiver noted above participated in the development of this care plan and knows that they can  request review of or adjustments to the care plan at any time.      All of the patient's questions and concerns have been addressed.    [name], PharmD   [facility] Specialty Pharmacist

## 2023-01-04 MED FILL — BIKTARVY 50 MG-200 MG-25 MG TABLET: ORAL | 30 days supply | Qty: 30 | Fill #3

## 2023-01-07 ENCOUNTER — Ambulatory Visit: Payer: 59 | Admitting: Physical Therapy

## 2023-01-07 ENCOUNTER — Ambulatory Visit: Payer: 59 | Admitting: Occupational Therapy

## 2023-01-07 DIAGNOSIS — R2681 Unsteadiness on feet: Secondary | ICD-10-CM

## 2023-01-07 DIAGNOSIS — M6281 Muscle weakness (generalized): Secondary | ICD-10-CM | POA: Diagnosis present

## 2023-01-07 DIAGNOSIS — R278 Other lack of coordination: Secondary | ICD-10-CM

## 2023-01-07 DIAGNOSIS — R2689 Other abnormalities of gait and mobility: Secondary | ICD-10-CM | POA: Diagnosis present

## 2023-01-07 NOTE — Therapy (Signed)
OUTPATIENT PHYSICAL THERAPY TREATMENT     Patient Name: James Ferrell MRN: [id number] DOB:July 21, 1975, 47 y.o., male Today's Date: 01/07/2023   PCP: Lucienne Minks provider REFERRING PROVIDER: Idelia Salm, MD  END OF SESSION:  PT End of Session - 01/07/23 1106     Visit Number 15    Number of Visits 16    Date for PT Re-Evaluation 03/04/23    Authorization Type UHC Medicare    Authorization Time Period 36 sessions over 12 weeks    Progress Note Due on Visit 20    PT Start Time 1105    PT Stop Time 1145    PT Time Calculation (min) 40 min    Activity Tolerance Patient tolerated treatment well    Behavior During Therapy [facility] for tasks assessed/performed                Past Medical History:  Diagnosis Date   CHB (complete heart block) (HCC) 2020   Endocarditis of aortic valve    Feb 2020 and April 2021   H/O mechanical aortic valve replacement    HIV (human immunodeficiency virus infection) (HCC)    Stroke [facility])    Past Surgical History:  Procedure Laterality Date   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/21/2019   With root replacement   BENTALL PROCEDURE  07/21/2019   CARDIAC VALVE SURGERY     MECHANICAL AORTIC VALVE REPLACEMENT  07/21/2019   Redo of valve surgery   PACEMAKER IMPLANT     TEE WITHOUT CARDIOVERSION N/A 05/13/2019   Procedure: TRANSESOPHAGEAL ECHOCARDIOGRAM (TEE);  Surgeon: Laurier Nancy, MD;  Location: [facility] ORS;  Service: Cardiovascular;  Laterality: N/A;   TOTAL HIP ARTHROPLASTY Right 08/30/2019   Procedure: TOTAL HIP ARTHROPLASTY ANTERIOR APPROACH;  Surgeon: Tarry Kos, MD;  Location: [facility] OR;  Service: Orthopedics;  Laterality: Right;   Patient Active Problem List   Diagnosis Date Noted   Status post mechanical aortic valve replacement 08/30/2019   Closed displaced fracture of right femoral neck (HCC) 08/30/2019   HIV (human immunodeficiency virus infection) (HCC) 05/20/2019   Bacteremia due to Staphylococcus aureus 05/10/2019   Sepsis  (HCC) 05/09/2019   Leukocytosis 05/09/2019   Hypotension 05/09/2019   AKI (acute kidney injury) (HCC) 05/09/2019    ONSET DATE: June 2024  REFERRING DIAG: I63.9 (ICD-10-CM) - CVA (cerebral vascular accident)  THERAPY DIAG:  Unsteadiness on feet  Other abnormalities of gait and mobility  Muscle weakness  Rationale for Evaluation and Treatment: Rehabilitation  SUBJECTIVE:  SUBJECTIVE STATEMENT: Pt states he had to miss the last few visits due to being sick. Reports he has been working on his exercises.   From eval: Pt reports he had a CVA this past June. Pt states his recovery has been going well. Pt states R LE is still weak. Pt states home health PT ended last week. Pt states balancing is still hard as well as going up/down the stairs. Wants to be able to walk better. Is interested in returning to work if able.  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   Per chart pt admit 6/4-6/12, went to inpatient rehab 6/12-6/28 for his CVA  PAIN:  Are you having pain? No  PRECAUTIONS: None  RED FLAGS: None   WEIGHT BEARING RESTRICTIONS: No  FALLS: Has patient fallen in last 6 months? No  LIVING ENVIRONMENT: Lives with: lives alone Lives in: House/apartment Stairs: Yes: External: 12 steps; bilateral but cannot reach both Has following equipment at home: None  PLOF: Independent  PATIENT  GOALS: Improve mobility for stairs and gait quality  OBJECTIVE:  Note: Objective measures were completed at Evaluation unless otherwise noted.  DIAGNOSTIC FINDINGS: CTA Head 06/26/22: Partially occlusive thrombus of the left distal M1 segment.   COORDINATION: Decreased on R due to strength deficits  MUSCLE TONE: RLE: +1 with knee and hip extension, multibeat myoclonus x 5 sec with R ankle DF  LOWER EXTREMITY ROM:     Active  Right Eval Left Eval  Hip flexion    Hip extension    Hip abduction    Hip adduction    Hip internal rotation    Hip external rotation    Knee flexion    Knee extension    Ankle dorsiflexion    Ankle plantarflexion    Ankle inversion    Ankle eversion     (Blank rows = not tested)  LOWER EXTREMITY MMT:    MMT Right Eval Left Eval  Hip flexion 5 5  Hip extension 4- 5  Hip abduction 4 4  Hip adduction    Hip internal rotation    Hip external rotation    Knee flexion 3+ 5  Knee extension 4- 5  Ankle dorsiflexion 3+ in seated unable in standing 5  Ankle plantarflexion 4 5  Ankle inversion    Ankle eversion    (Blank rows = not tested)  BED MOBILITY:  Independent  TRANSFERS: Independent  STAIRS: Level of Assistance: Modified independence Stair Negotiation Technique: Step to Pattern with Single Rail on Right Number of Stairs: 4  Height of Stairs: 6"  Comments: R LE circumduction to ascend, wide BOS  GAIT: Gait pattern: Decreased R ankle DF on heel strike (foot slaps) with poor toe off, R hip circumduction during swim with decreased knee extension, decreased R LE stance time, compensatory trunk lean to R during stance phase Distance walked: >200' Assistive device utilized: None Level of assistance: Complete Independence  FUNCTIONAL TESTS:  Eval: 5x STS 12.97 sec with heavy weightshift to L LE Berg Balance Test: 47/56 DGI: 14/24  01/07/23: 5x STS 11.12 sec Berg Balance Test: 52/56 DGI: 19/24  [facility] PT Assessment - 01/07/23  0001        Berg Balance Test   Sit to Stand Able to stand without using hands and stabilize independently    Standing Unsupported Able to stand safely 2 minutes    Sitting with Back Unsupported but Feet Supported on Floor or Stool Able to sit safely and securely 2 minutes    Stand to Sit Sits safely with minimal use of hands    Transfers Able to transfer safely, minor use of hands    Standing Unsupported with Eyes Closed Able to stand 10 seconds safely    Standing Unsupported with Feet Together Able to place feet together independently and stand 1 minute safely    From Standing, Reach Forward with Outstretched Arm Can reach confidently >25 cm (10")    From Standing Position, Pick up Object from Floor Able to pick up shoe safely and easily    From Standing Position, Turn to Look Behind Over each Shoulder Looks behind from both sides and weight shifts well    Turn 360 Degrees Able to turn 360 degrees safely one side only in 4 seconds or less   5.34 turning left   Standing Unsupported, Alternately Place Feet on Step/Stool Able to stand independently and safely and complete 8 steps in 20 seconds    Standing Unsupported, One Foot in Front Able to place foot tandem independently and hold 30 seconds    Standing on One Leg Tries to lift leg/unable to hold 3 seconds but remains standing independently   >30 sec on L   Total Score 52      Dynamic Gait Index   Level Surface Mild Impairment    Change in Gait Speed Mild Impairment    Gait with Horizontal Head Turns Normal    Gait with Vertical Head Turns Normal    Gait and Pivot Turn Mild Impairment    Step Over Obstacle Mild Impairment    Step Around Obstacles Normal    Steps Mild Impairment    Total Score 19                PATIENT SURVEYS:  FOTO 66; predicted 76  VITALS  There were no vitals filed for this visit.   TODAY'S TREATMENT:       Elliptical x 3 min fwd Eccentric step down 6" step 2x10 R&L Runner's step up 6" step 2x10  Tall  kneel to half kneel x5 Half kneel rock fwd/bwd 2x10 Half kneel R hip ER 2x10 Rechecking pt's goals     PATIENT EDUCATION: Education details: Continue HEP, think about bracing options  Person educated: Patient Education method: Explanation and Demonstration Education comprehension: verbalized understanding and needs further education  HOME EXERCISE PROGRAM: Access Code: [email or web address] URL: [email or web address] Date: 01/07/2023 Prepared by: Vernon Prey April Kirstie Peri  Exercises - Standing Gastroc Stretch  - 1 x daily - 7 x weekly - 2 sets - 30 sec hold - Standing Soleus Stretch  - 1 x daily - 7 x weekly - 2 sets - 30 sec hold - Walking Forward Lunge  - 1 x daily - 7 x weekly - 2 sets - 10 reps - Single Leg Stance  - 1 x daily - 7 x weekly - 2 sets - 10 reps - Runner's Step Up/Down  - 1 x daily - 7 x weekly - 3 sets - 10 reps - Forward Step Down with Heel Tap and Counter Support  - 1 x daily - 7 x weekly - 3 sets - 10  reps  GOALS: Goals reviewed with patient? Yes  SHORT TERM GOALS: Target date: 11/30/2022   Pt will be ind with HEP Baseline: Goal status: PARTIALLY MET -- not consistent 11/30/22   2.  Pt will be able to perform ankle DF in standing for heel strike Baseline:  Goal status: MET 11/14/22    LONG TERM GOALS: Target date: 03/04/2023   Pt will be ind with management and progression of HEP Baseline:  Goal status: IN PROGRESS  2.  Pt will be able to demonstrate decreased hip circumduction with improved heel strike with or without R AFO for improved gait stabilty x 1000' for limited community distances Baseline: foot slap/decreased heel strike and toe off, R hip circumduction, decreased knee flexion during swing 01/07/23: Increased heel strike with foot drop brace, R hip circumduction still present Goal status: IN PROGRESS  3.  Pt will have improved FOTO score to >/=76 Baseline: 66 01/07/23: 62 Goal status: IN PROGRESS  4.  Pt will demo improved Berg  Balance to >/=52/56 to be categorized as a low fall risk Baseline: 47/56 01/07/23: 52/56 Goal status: MET  5.  Pt will have improved DGI to >/=20/24 for low fall risk Baseline:  01/07/23: 19/24 Goal status: IN PROGRESS  6.  Pt will be able to ascend/descend 12 steps with normal reciprocal pattern without hand holds for improved entry/exit of home Baseline:  01/07/23: Reciprocal pattern with hand rails for safety Goal status: IN PROGRESS  ASSESSMENT:  CLINICAL IMPRESSION: Pt has met 1/6 of his LTGs. His Berg Balance Score has improved and is one point away from meeting his DGI goal. Pt is demonstrating increasing strength and weight shift on R LE but continues to require cueing to increase use of it. Reviewed pt's HEP as pt states he does not have the space to perform lunges at home. Pt will benefit from continued PT to address his deficits and improve his overall mobility.    OBJECTIVE IMPAIRMENTS: Abnormal gait, decreased activity tolerance, decreased balance, decreased coordination, decreased endurance, decreased mobility, difficulty walking, decreased strength, and improper body mechanics.   PLAN:  PT FREQUENCY: 2x/week  PT DURATION: 8 weeks  PLANNED INTERVENTIONS: 97146- PT Re-evaluation, 97110-Therapeutic exercises, 97530- Therapeutic activity, [id number]- Neuromuscular re-education, 97535- Self Care, 87564- Manual therapy, [phone]- Gait training, [phone]- Orthotic Fit/training, [phone]- Aquatic Therapy, [phone]- Ionotophoresis 4mg /ml Dexamethasone, Patient/Family education, Balance training, Stair training, Taping, Cryotherapy, and Moist heat  PLAN FOR NEXT SESSION: Assess response to HEP. Lunges, multidirectional stepping/weight shifting, stairs/steps, and eccentric quad control. Gait training with/without drop foot support. Continue ankle strengthening. R LE strengthening, balance, and coordination   [facility] April Ma L Galva, PT, DPT 01/07/2023, 12:01 PM

## 2023-01-07 NOTE — Patient Instructions (Signed)
  Coordination Activities  Perform the following activities for 15 minutes 1-2 times per day with right hand(s).  Rotate ball in fingertips (clockwise and counter-clockwise). Toss ball in air and catch with Rt hand Flip cards 1 at a time as fast as you can. Deal cards with your thumb (Hold deck in hand and push card off top with thumb). Rotate ONE card in hand (clockwise and counter-clockwise). Pick up pennies and place in container or coin bank. Pick up coins one at a time until you get 5 in your hand, then move coins from palm to fingertips to stack one at a time. (3 stacks of 5)  Practice writing bigger. Screw together nuts and bolts, then unfasten.

## 2023-01-07 NOTE — Therapy (Signed)
OUTPATIENT OCCUPATIONAL THERAPY NEURO EVALUATION  Patient Name: James Ferrell MRN: [id number] DOB:03-07-1975, 47 y.o., male Today's Date: 01/07/2023  PCP: Dorris Carnes, MD REFERRING PROVIDER: Dorris Carnes, MD  END OF SESSION:  OT End of Session - 01/07/23 1236     Visit Number 1    Number of Visits 12    Date for OT Re-Evaluation 03/22/23    Authorization Type [employer] Dual complete -covered at 100%    Progress Note Due on Visit 10    OT Start Time 1015    OT Stop Time 1100    OT Time Calculation (min) 45 min    Activity Tolerance Patient tolerated treatment well    Behavior During Therapy [facility] for tasks assessed/performed             Past Medical History:  Diagnosis Date   CHB (complete heart block) (HCC) 2020   Endocarditis of aortic valve    Feb 2020 and April 2021   H/O mechanical aortic valve replacement    HIV (human immunodeficiency virus infection) (HCC)    Stroke [facility])    Past Surgical History:  Procedure Laterality Date   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/21/2019   With root replacement   BENTALL PROCEDURE  07/21/2019   CARDIAC VALVE SURGERY     MECHANICAL AORTIC VALVE REPLACEMENT  07/21/2019   Redo of valve surgery   PACEMAKER IMPLANT     TEE WITHOUT CARDIOVERSION N/A 05/13/2019   Procedure: TRANSESOPHAGEAL ECHOCARDIOGRAM (TEE);  Surgeon: Laurier Nancy, MD;  Location: [facility] ORS;  Service: Cardiovascular;  Laterality: N/A;   TOTAL HIP ARTHROPLASTY Right 08/30/2019   Procedure: TOTAL HIP ARTHROPLASTY ANTERIOR APPROACH;  Surgeon: Tarry Kos, MD;  Location: [facility] OR;  Service: Orthopedics;  Laterality: Right;   Patient Active Problem List   Diagnosis Date Noted   Status post mechanical aortic valve replacement 08/30/2019   Closed displaced fracture of right femoral neck (HCC) 08/30/2019   HIV (human immunodeficiency virus infection) (HCC) 05/20/2019   Bacteremia due to Staphylococcus aureus 05/10/2019   Sepsis (HCC) 05/09/2019   Leukocytosis  05/09/2019   Hypotension 05/09/2019   AKI (acute kidney injury) (HCC) 05/09/2019    ONSET DATE: 01/01/2023  REFERRING DIAG: M84.13 (ICD-10-CM) - Personal history of transient ischemic attack (TIA), and cerebral infarction without residual deficits  THERAPY DIAG:  Other lack of coordination  Muscle weakness (generalized)  Unsteadiness on feet  Rationale for Evaluation and Treatment: Rehabilitation  SUBJECTIVE:   SUBJECTIVE STATEMENT: My stroke was in June, but had one in 2021 Pt accompanied by: self  PERTINENT HISTORY: HIV, 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    Per chart pt admit 6/4-6/12, went to inpatient rehab 6/12-6/28 for his CVA  PRECAUTIONS: Fall and ICD/Pacemaker, HIV +  WEIGHT BEARING RESTRICTIONS: No  PAIN:  Are you having pain? No    FALLS: Has patient fallen in last 6 months? No   LIVING ENVIRONMENT: Lives with: lives alone Lives in: 2nd floor apartment Stairs: Yes: External: 12 steps; bilateral but cannot reach both Has following equipment at home: None   PLOF: Independent w/ ADLS, on disability d/t heart condition  PATIENT GOALS: improve coordination Rt hand  OBJECTIVE:  Note: Objective measures were completed at Evaluation unless otherwise noted.  HAND DOMINANCE: Right  ADLs: Eating: independent Grooming: 10% Rt hand, majority w/ Lt hand UB Dressing: independent LB Dressing: some difficulty with buttons and tying shoes, mod I  Toileting: independent Bathing: independent Tub Shower transfers: mod I  Equipment: Grab bars  IADLs: Shopping: mod I (neighbor takes him) Light  housekeeping: mod I  Meal Prep: mod I  Community mobility: doesn't have car yet Medication management: independent Financial management: independent Handwriting: 100% legible and Moderate micrographia  MOBILITY STATUS: Independent   FUNCTIONAL OUTCOME MEASURES: FOTO: 61 (RUE)   UPPER EXTREMITY ROM:  BUE AROM WNL's   UPPER EXTREMITY MMT:   RUE grossly 4/5  HAND FUNCTION: Grip strength: Right: 66.1 lbs; Left: 112.2 lbs  COORDINATION: 9 Hole Peg test: Right: 36.35 sec; Left: 22.26 sec  SENSATION: WFL Light touch: WFL  EDEMA: none   COGNITION: Overall cognitive status: Within functional limits for tasks assessed  VISION: Subjective report: denies change Baseline vision: No visual deficits Visual history:  none  PERCEPTION: Not tested  PRAXIS: Not tested    TODAY'S TREATMENT:                                                                                                                              DATE: 01/07/23  Issued coordination HEP - see pt instructions for details Also assisted pt in completing FOTO for UE   PATIENT EDUCATION: Education details: Coordination HEP  Person educated: Patient Education method: Programmer, multimedia, Demonstration, Verbal cues, and Handouts Education comprehension: verbalized understanding and returned demonstration  HOME EXERCISE PROGRAM: 01/07/23: coordination HEP    GOALS: Goals reviewed with patient? Yes  SHORT TERM GOALS: Target date: 02/22/23 (extended d/t pt unable to return again until January)   Independent with HEP for coordination Rt hand Baseline: Goal status: MET at eval  2.  Independent with putty HEP for Rt hand and theraband HEP for RUE strengthening Baseline:  Goal status: INITIAL   LONG TERM GOALS: Target date: 03/22/23  Improve coordination as evidenced by performing 9 hole peg test in under 30 sec Baseline: 36 sec Goal status: INITIAL  2.  Grip strength Rt hand to increase to 71 lbs or greater Rt  hand Baseline: 66 lbs Goal status: INITIAL  3.  Pt to groom 50% with Rt dominant hand Baseline: 10% Goal status: INITIAL  4.  FOTO to increase from 61 to 65 score for RUE Baseline:  Goal status: INITIAL    ASSESSMENT:  CLINICAL IMPRESSION: Patient is a 47 y.o. male who was seen today for occupational therapy evaluation for CVA. Hx includes heart issues w/ surgeries, pacemaker, and HIV+. Patient currently presents slightly below baseline level of functioning RUE demonstrating functional deficits and impairments as noted below. Pt would benefit from skilled OT services in the outpatient setting to work on impairments as noted below to help pt return to PLOF as able.  PERFORMANCE DEFICITS: in functional skills including IADLs, coordination, strength, Fine motor control, endurance, and UE functional use.   IMPAIRMENTS: are limiting patient from IADLs.   CO-MORBIDITIES: may have co-morbidities  that affects occupational performance. Patient will benefit from skilled OT to address above impairments and improve overall function.  MODIFICATION OR ASSISTANCE TO COMPLETE EVALUATION: No modification of tasks or assist necessary to complete an evaluation.  OT OCCUPATIONAL PROFILE AND HISTORY: Problem focused assessment: Including review of records relating to presenting problem.  CLINICAL DECISION MAKING: LOW - limited treatment options, no task modification necessary  REHAB POTENTIAL: Good  EVALUATION COMPLEXITY: Low    PLAN:  OT FREQUENCY: 1-2x/week  OT DURATION: 8 weeks  PLANNED INTERVENTIONS: 97535 self care/ADL training, 02542 therapeutic exercise, 97530 therapeutic activity, 97112 neuromuscular re-education, 97140 manual therapy, 97039 fluidotherapy, passive range of motion, energy conservation, patient/family education, and DME and/or AE instructions  RECOMMENDED OTHER SERVICES: none at this time (currently seeing P.T.)  CONSULTED AND AGREED WITH PLAN OF CARE:  Patient  PLAN FOR NEXT SESSION: Review coordination, issue theraband and putty HEP for RUE   Sheran Lawless, OT 01/07/2023, 12:38 PM

## 2023-01-09 ENCOUNTER — Ambulatory Visit: Admit: 2023-01-09 | Payer: MEDICARE

## 2023-01-21 ENCOUNTER — Ambulatory Visit: Payer: 59 | Admitting: Physical Therapy

## 2023-01-25 ENCOUNTER — Ambulatory Visit: Payer: 59 | Attending: Internal Medicine | Admitting: Physical Therapy

## 2023-01-25 DIAGNOSIS — R278 Other lack of coordination: Secondary | ICD-10-CM | POA: Diagnosis present

## 2023-01-25 DIAGNOSIS — M6281 Muscle weakness (generalized): Secondary | ICD-10-CM | POA: Insufficient documentation

## 2023-01-25 DIAGNOSIS — R2689 Other abnormalities of gait and mobility: Secondary | ICD-10-CM | POA: Insufficient documentation

## 2023-01-25 DIAGNOSIS — R2681 Unsteadiness on feet: Secondary | ICD-10-CM | POA: Diagnosis present

## 2023-01-25 DIAGNOSIS — R41842 Visuospatial deficit: Secondary | ICD-10-CM | POA: Diagnosis present

## 2023-01-25 NOTE — Therapy (Signed)
 OUTPATIENT PHYSICAL THERAPY TREATMENT    Patient Name: James Ferrell MRN: [id number] DOB:January 19, 1976, 48 y.o., male Today's Date: 01/25/2023   PCP: UNK provider REFERRING PROVIDER: Jimmye Emmie FALCON, MD  END OF SESSION:  PT End of Session - 01/25/23 1300     Visit Number 16    Date for PT Re-Evaluation 03/04/23    Authorization Type UHC Medicare    Authorization Time Period --    Progress Note Due on Visit 20    PT Start Time 1305    PT Stop Time 1350    PT Time Calculation (min) 45 min    Activity Tolerance Patient tolerated treatment well    Behavior During Therapy [facility] for tasks assessed/performed                Past Medical History:  Diagnosis Date   CHB (complete heart block) (HCC) 2020   Endocarditis of aortic valve    Feb 2020 and April 2021   H/O mechanical aortic valve replacement    HIV (human immunodeficiency virus infection) (HCC)    Stroke [facility])    Past Surgical History:  Procedure Laterality Date   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/21/2019   With root replacement   BENTALL PROCEDURE  07/21/2019   CARDIAC VALVE SURGERY     MECHANICAL AORTIC VALVE REPLACEMENT  07/21/2019   Redo of valve surgery   PACEMAKER IMPLANT     TEE WITHOUT CARDIOVERSION N/A 05/13/2019   Procedure: TRANSESOPHAGEAL ECHOCARDIOGRAM (TEE);  Surgeon: Fernand Denyse LABOR, MD;  Location: [facility] ORS;  Service: Cardiovascular;  Laterality: N/A;   TOTAL HIP ARTHROPLASTY Right 08/30/2019   Procedure: TOTAL HIP ARTHROPLASTY ANTERIOR APPROACH;  Surgeon: Jerri Kay HERO, MD;  Location: [facility] OR;  Service: Orthopedics;  Laterality: Right;   Patient Active Problem List   Diagnosis Date Noted   Status post mechanical aortic valve replacement 08/30/2019   Closed displaced fracture of right femoral neck (HCC) 08/30/2019   HIV (human immunodeficiency virus infection) (HCC) 05/20/2019   Bacteremia due to Staphylococcus aureus 05/10/2019   Sepsis (HCC) 05/09/2019   Leukocytosis 05/09/2019    Hypotension 05/09/2019   AKI (acute kidney injury) (HCC) 05/09/2019    ONSET DATE: June 2024  REFERRING DIAG: I63.9 (ICD-10-CM) - CVA (cerebral vascular accident)  THERAPY DIAG:  Unsteadiness on feet  Other abnormalities of gait and mobility  Muscle weakness  Other lack of coordination  Muscle weakness (generalized)  Rationale for Evaluation and Treatment: Rehabilitation  SUBJECTIVE:  SUBJECTIVE STATEMENT: Pt states he was supposed to come but had transportation issues. Pt states he's been doing his exercises a few times.   From eval: Pt reports he had a CVA this past June. Pt states his recovery has been going well. Pt states R LE is still weak. Pt states home health PT ended last week. Pt states balancing is still hard as well as going up/down the stairs. Wants to be able to walk better. Is interested in returning to work if able.  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   Per chart pt admit 6/4-6/12, went to inpatient rehab 6/12-6/28 for his CVA  PAIN:  Are you having pain? No  PRECAUTIONS: None  RED FLAGS: None   WEIGHT BEARING RESTRICTIONS: No  FALLS: Has patient fallen in last 6 months? No  LIVING ENVIRONMENT: Lives with: lives alone Lives in: House/apartment Stairs: Yes: External: 12 steps; bilateral but cannot reach both Has following equipment at home: None  PLOF:  Independent  PATIENT GOALS: Improve mobility for stairs and gait quality  OBJECTIVE:  Note: Objective measures were completed at Evaluation unless otherwise noted.  DIAGNOSTIC FINDINGS: CTA Head 06/26/22: Partially occlusive thrombus of the left distal M1 segment.   COORDINATION: Decreased on R due to strength deficits  MUSCLE TONE: RLE: +1 with knee and hip extension, multibeat myoclonus x 5 sec with R ankle DF  LOWER EXTREMITY ROM:     Active  Right Eval Left Eval  Hip flexion    Hip extension    Hip abduction    Hip adduction    Hip internal rotation    Hip external rotation    Knee flexion    Knee extension    Ankle dorsiflexion    Ankle plantarflexion    Ankle inversion    Ankle eversion     (Blank rows = not tested)  LOWER EXTREMITY MMT:    MMT Right Eval Left Eval  Hip flexion 5 5  Hip extension 4- 5  Hip abduction 4 4  Hip adduction    Hip internal rotation    Hip external rotation    Knee flexion 3+ 5  Knee extension 4- 5  Ankle dorsiflexion 3+ in seated unable in standing 5  Ankle plantarflexion 4 5  Ankle inversion    Ankle eversion    (Blank rows = not tested)  BED MOBILITY:  Independent  TRANSFERS: Independent  STAIRS: Level of Assistance: Modified independence Stair Negotiation Technique: Step to Pattern with Single Rail on Right Number of Stairs: 4  Height of Stairs: 6  Comments: R LE circumduction to ascend, wide BOS  GAIT: Gait pattern: Decreased R ankle DF on heel strike (foot slaps) with poor toe off, R hip circumduction during swim with decreased knee extension, decreased R LE stance time, compensatory trunk lean to R during stance phase Distance walked: >200' Assistive device utilized: None Level of assistance: Complete Independence  FUNCTIONAL TESTS:  Eval: 5x STS 12.97 sec with heavy weightshift to L LE Berg Balance Test: 47/56 DGI: 14/24  01/07/23: 5x STS 11.12 sec Berg Balance Test: 52/56 DGI:  19/24  PATIENT SURVEYS:  FOTO 66; predicted 23  VITALS  There were no vitals filed for this visit.   TODAY'S TREATMENT:       Elliptical x 3 min fwd, 3 min bwd Double Leg press x10 140#, single leg on R 130# x8 Forward lunge 2x10 R&L  Sitting calf raise x10 Standing heel/toe raise 2x10 Fwd amb x 4 laps around gym, working on heel/toe pattern, reducing R foot rotation, decreasing hip hike/circumduction with toe up brace Blaze pods single leg stance 3x1' R&L working on [employer] bearing and coordination    PATIENT EDUCATION: Education details: Continue HEP, think about bracing options  Person educated: Patient Education method: Medical Illustrator Education comprehension: verbalized understanding and needs further education  HOME EXERCISE PROGRAM: Access Code: [email or web address] URL: [email or web address] Date: 01/07/2023 Prepared by: Airam Runions April Earnie Starring  Exercises - Standing Gastroc Stretch  - 1 x daily - 7 x weekly - 2 sets - 30 sec hold - Standing Soleus Stretch  - 1 x daily - 7 x weekly - 2 sets - 30 sec hold - Walking Forward Lunge  - 1 x daily - 7 x weekly - 2 sets - 10 reps - Single Leg Stance  - 1 x daily - 7 x weekly - 2 sets - 10 reps - Runner's Step Up/Down  - 1 x daily - 7 x weekly - 3 sets - 10 reps - Forward Step Down with Heel Tap and Counter Support  - 1 x daily - 7 x weekly - 3 sets - 10 reps  GOALS: Goals reviewed with patient? Yes  SHORT TERM GOALS: Target date: 11/30/2022   Pt will be ind with HEP Baseline: Goal status: PARTIALLY MET -- not consistent 11/30/22   2.  Pt will be able to perform ankle DF in standing for heel strike Baseline:  Goal status: MET 11/14/22    LONG TERM GOALS: Target date: 03/04/2023   Pt will be ind with management and progression of HEP Baseline:  Goal status: IN PROGRESS  2.  Pt will be able to demonstrate decreased hip circumduction with improved heel strike with or without R AFO for  improved gait stabilty x 1000' for limited community distances Baseline: foot slap/decreased heel strike and toe off, R hip circumduction, decreased knee flexion during swing 01/07/23: Increased heel strike with foot drop brace, R hip circumduction still present Goal status: IN PROGRESS  3.  Pt will have improved FOTO score to >/=76 Baseline: 66 01/07/23: 62 Goal status: IN PROGRESS  4.  Pt will demo improved Berg Balance to >/=52/56 to be categorized as a low fall risk Baseline: 47/56 01/07/23: 52/56 Goal status: MET  5.  Pt will have improved DGI to >/=20/24 for low fall risk Baseline:  01/07/23: 19/24 Goal status: IN PROGRESS  6.  Pt will be able to ascend/descend 12 steps with normal reciprocal pattern without hand holds for improved entry/exit of home Baseline:  01/07/23: Reciprocal pattern with hand rails for safety Goal status: IN PROGRESS  ASSESSMENT:  CLINICAL IMPRESSION: Treatment focused on continuing R LE strength, coordination, and balance. Some lapse in treatment due to transportation issues. Some LOB while performing deep lunges today with increased tone/clonus in R LE with toe extended, ankle dorsiflexed and knee flexed. Discussed with pt to talk to neurologist about this when he next sees him.    OBJECTIVE IMPAIRMENTS: Abnormal gait, decreased activity tolerance, decreased balance, decreased coordination, decreased endurance, decreased mobility, difficulty walking, decreased strength, and improper body mechanics.  PLAN:  PT FREQUENCY: 2x/week  PT DURATION: 8 weeks  PLANNED INTERVENTIONS: 97146- PT Re-evaluation, 97110-Therapeutic exercises, 97530- Therapeutic activity, [id number]- Neuromuscular re-education, 97535- Self Care, 02859- Manual therapy, [phone]- Gait training, [phone]- Orthotic Fit/training, [phone]- Aquatic Therapy, [phone]- Ionotophoresis 4mg /ml Dexamethasone , Patient/Family education, Balance training, Stair training, Taping, Cryotherapy, and Moist  heat  PLAN FOR NEXT SESSION: Assess response to HEP. Lunges, multidirectional stepping/weight shifting, stairs/steps, and eccentric quad control. Gait training with/without drop foot support. Continue ankle strengthening. R LE strengthening, balance, and coordination   Dailynn Nancarrow April Ma L [place], PT, DPT 01/25/2023, 1:01 PM
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
## 2023-01-30 ENCOUNTER — Encounter: Payer: Self-pay | Admitting: Occupational Therapy

## 2023-01-30 ENCOUNTER — Ambulatory Visit: Payer: 59 | Admitting: Occupational Therapy

## 2023-01-30 ENCOUNTER — Ambulatory Visit: Payer: 59 | Admitting: Physical Therapy

## 2023-01-30 DIAGNOSIS — R2681 Unsteadiness on feet: Secondary | ICD-10-CM

## 2023-01-30 DIAGNOSIS — M6281 Muscle weakness (generalized): Secondary | ICD-10-CM

## 2023-01-30 DIAGNOSIS — R2689 Other abnormalities of gait and mobility: Secondary | ICD-10-CM

## 2023-01-30 DIAGNOSIS — R278 Other lack of coordination: Secondary | ICD-10-CM

## 2023-01-30 NOTE — Patient Instructions (Signed)
 1. Grip Strengthening (Resistive Putty)   Squeeze putty using thumb and all fingers. Repeat _20___ times. Do __2__ sessions per day.    Strengthening: Resisted Flexion   Hold tubing with __Rt___ arm(s) at side. Pull forward and up. Move shoulder through pain-free range of motion. Repeat __10__ times per set.  Do _1-2_ sessions per day , every other day   Strengthening: Resisted Extension   Hold tubing in __both___ hand(s), arm forward. Pull arm back, elbow straight. Repeat _10___ times per set. Do _1-2___ sessions per day, every other day.   Resisted Horizontal Abduction: Bilateral   Sit or stand, tubing in both hands, arms out in front. Keeping arms straight, pinch shoulder blades together and stretch arms out. Repeat _10___ times per set. Do _1-2___ sessions per day, every other day.   Elbow Flexion: Resisted   With tubing held in ___Rt___ hand(s) and other end secured under foot, curl arm up as far as possible. Repeat _10___ times per set. Do _1-2___ sessions per day, every other day.    Elbow Extension: Resisted   Sit in chair with resistive band secured at armrest (or hold with other hand) and ____Rt___ elbow bent. Straighten elbow. Repeat _10___ times per set.  Do _1-2___ sessions per day, every other day.   Copyright  VHI. All rights reserved.

## 2023-01-30 NOTE — Therapy (Signed)
 OUTPATIENT OCCUPATIONAL THERAPY NEURO TREATMENT  Patient Name: James Ferrell MRN: [id number] DOB:[birth year], 48 y.o., male Today's Date: 01/30/2023  PCP: Marnie Emmie FALCON, MD REFERRING PROVIDER: Marnie Emmie FALCON, MD  END OF SESSION:  OT End of Session - 01/30/23 1324     Visit Number 2    Number of Visits 12    Date for OT Re-Evaluation 03/22/23    Authorization Type [employer] Dual complete -covered at 100%    Progress Note Due on Visit 10    OT Start Time 1320    OT Stop Time 1400    OT Time Calculation (min) 40 min    Activity Tolerance Patient tolerated treatment well    Behavior During Therapy [facility] for tasks assessed/performed             Past Medical History:  Diagnosis Date   CHB (complete heart block) (HCC) 2020   Endocarditis of aortic valve    Feb 2020 and April 2021   H/O mechanical aortic valve replacement    HIV (human immunodeficiency virus infection) (HCC)    Stroke [facility])    Past Surgical History:  Procedure Laterality Date   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/21/2019   With root replacement   BENTALL PROCEDURE  07/21/2019   CARDIAC VALVE SURGERY     MECHANICAL AORTIC VALVE REPLACEMENT  07/21/2019   Redo of valve surgery   PACEMAKER IMPLANT     TEE WITHOUT CARDIOVERSION N/A 05/13/2019   Procedure: TRANSESOPHAGEAL ECHOCARDIOGRAM (TEE);  Surgeon: Fernand Denyse LABOR, MD;  Location: [facility] ORS;  Service: Cardiovascular;  Laterality: N/A;   TOTAL HIP ARTHROPLASTY Right 08/30/2019   Procedure: TOTAL HIP ARTHROPLASTY ANTERIOR APPROACH;  Surgeon: Jerri Kay HERO, MD;  Location: [facility] OR;  Service: Orthopedics;  Laterality: Right;   Patient Active Problem List   Diagnosis Date Noted   Status post mechanical aortic valve replacement 08/30/2019   Closed displaced fracture of right femoral neck (HCC) 08/30/2019   HIV (human immunodeficiency virus infection) (HCC) 05/20/2019   Bacteremia due to Staphylococcus aureus 05/10/2019   Sepsis (HCC) 05/09/2019   Leukocytosis  05/09/2019   Hypotension 05/09/2019   AKI (acute kidney injury) (HCC) 05/09/2019    ONSET DATE: 01/01/2023  REFERRING DIAG: S13.26 (ICD-10-CM) - Personal history of transient ischemic attack (TIA), and cerebral infarction without residual deficits  THERAPY DIAG:  Other lack of coordination  Muscle weakness  Unsteadiness on feet  Rationale for Evaluation and Treatment: Rehabilitation  SUBJECTIVE:   SUBJECTIVE STATEMENT: No pain and no falls  Stroke was in June 2024, but had one in 2021 Pt accompanied by: self  PERTINENT HISTORY: HIV, 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    Per chart pt admit 6/4-6/12, went to inpatient rehab 6/12-6/28 for his CVA  PRECAUTIONS: Fall and ICD/Pacemaker, HIV +  WEIGHT BEARING RESTRICTIONS: No  PAIN:  Are you having pain? No    FALLS: Has patient fallen in last 6 months? No   LIVING ENVIRONMENT: Lives with: lives alone Lives in: 2nd floor apartment Stairs: Yes: External: 12 steps; bilateral but cannot reach both Has following equipment at home: None   PLOF: Independent w/ ADLS, on disability d/t heart condition  PATIENT GOALS:  improve coordination Rt hand  OBJECTIVE:  Note: Objective measures were completed at Evaluation unless otherwise noted.  HAND DOMINANCE: Right  ADLs: Eating: independent Grooming: 10% Rt hand, majority w/ Lt hand UB Dressing: independent LB Dressing: some difficulty with buttons and tying shoes, mod I  Toileting: independent Bathing: independent Tub Shower transfers: mod I  Equipment: Grab bars  IADLs: Shopping: mod I (neighbor takes  him) Light housekeeping: mod I  Meal Prep: mod I  Community mobility: doesn't have car yet Medication management: independent Financial management: independent Handwriting: 100% legible and Moderate micrographia  MOBILITY STATUS: Independent   FUNCTIONAL OUTCOME MEASURES: FOTO: 61 (RUE)   UPPER EXTREMITY ROM:  BUE AROM WNL's   UPPER EXTREMITY MMT:   RUE grossly 4/5  HAND FUNCTION: Grip strength: Right: 66.1 lbs; Left: 112.2 lbs  COORDINATION: 9 Hole Peg test: Right: 36.35 sec; Left: 22.26 sec  SENSATION: WFL Light touch: WFL  EDEMA: none   COGNITION: Overall cognitive status: Within functional limits for tasks assessed  VISION: Subjective report: denies change Baseline vision: No visual deficits Visual history:  none  PERCEPTION: Not tested  PRAXIS: Not tested    TODAY'S TREATMENT:                                                                                                                              DATE: 01/30/23  Reviewed coordination HEP - pt required mod cues for each ex and reports he hasn't been doing since evaluation  Issued putty and theraband HEP - See pt instructions for details. Pt required mod to max cues for theraband HEP for correct positioning  PATIENT EDUCATION: Education details: putty and theraband HEP   Person educated: Patient Education method: Explanation, Demonstration, Verbal cues, and Handouts Education comprehension: verbalized understanding, returned demonstration, verbal cues required, and needs further education  HOME EXERCISE PROGRAM: 01/07/23: coordination HEP  01/30/23: putty and theraband HEP     GOALS: Goals reviewed with patient? Yes  SHORT TERM GOALS: Target date: 02/22/23 (extended d/t pt unable to return again until January)   Independent with HEP for coordination Rt hand Baseline: Goal status: MET at eval  2.  Independent with putty HEP for Rt hand and theraband HEP for RUE strengthening Baseline:  Goal  status: IN PROGRESS   LONG TERM GOALS: Target date: 03/22/23  Improve coordination as evidenced by performing 9 hole peg test in under 30 sec Baseline: 36 sec Goal status: INITIAL  2.  Grip strength Rt hand to increase to 71 lbs or greater Rt hand Baseline: 66 lbs Goal status: INITIAL  3.  Pt to groom 50% with Rt dominant hand Baseline: 10% Goal status: INITIAL  4.  FOTO to increase from 61 to 65 score for RUE Baseline:  Goal status: INITIAL    ASSESSMENT:  CLINICAL IMPRESSION: Patient is a 48 y.o. male who was seen today for occupational therapy treatment for CVA. Hx includes heart issues w/ surgeries, pacemaker, and  HIV+. Patient requires review of HEP's for proper positioning and carryover at home. Pt would benefit from skilled OT services in the outpatient setting to work on impairments as noted below to help pt return to PLOF as able.     PERFORMANCE DEFICITS: in functional skills including IADLs, coordination, strength, Fine motor control, endurance, and UE functional use.   IMPAIRMENTS: are limiting patient from IADLs.   CO-MORBIDITIES: may have co-morbidities  that affects occupational performance. Patient will benefit from skilled OT to address above impairments and improve overall function.  MODIFICATION OR ASSISTANCE TO COMPLETE EVALUATION: No modification of tasks or assist necessary to complete an evaluation.  OT OCCUPATIONAL PROFILE AND HISTORY: Problem focused assessment: Including review of records relating to presenting problem.  CLINICAL DECISION MAKING: LOW - limited treatment options, no task modification necessary  REHAB POTENTIAL: Good  EVALUATION COMPLEXITY: Low    PLAN:  OT FREQUENCY: 1-2x/week  OT DURATION: 8 weeks  PLANNED INTERVENTIONS: 97535 self care/ADL training, 02889 therapeutic exercise, 97530 therapeutic activity, 97112 neuromuscular re-education, 97140 manual therapy, 97039 fluidotherapy, passive range of motion, energy  conservation, patient/family education, and DME and/or AE instructions  RECOMMENDED OTHER SERVICES: none at this time (currently seeing P.T.)  CONSULTED AND AGREED WITH PLAN OF CARE: Patient  PLAN FOR NEXT SESSION: Review theraband HEP, continue with RUE functional use, proper reaching pattern, and coordination   Burnard JINNY Roads, OT 01/30/2023, 1:25 PM

## 2023-01-30 NOTE — Therapy (Signed)
 OUTPATIENT PHYSICAL THERAPY TREATMENT    Patient Name: James Ferrell MRN: [id number] DOB:01-Mar-1975, 48 y.o., male Today's Date: 01/30/2023   PCP: UNK provider REFERRING PROVIDER: Jimmye Emmie FALCON, MD  END OF SESSION:  PT End of Session - 01/30/23 1323     Visit Number 17    Date for PT Re-Evaluation 03/04/23    Authorization Type [employer] Medicare    Progress Note Due on Visit 20    PT Start Time 1400    PT Stop Time 1440    PT Time Calculation (min) 40 min    Activity Tolerance Patient tolerated treatment well    Behavior During Therapy [facility] for tasks assessed/performed                Past Medical History:  Diagnosis Date   CHB (complete heart block) (HCC) 2020   Endocarditis of aortic valve    Feb 2020 and April 2021   H/O mechanical aortic valve replacement    HIV (human immunodeficiency virus infection) (HCC)    Stroke [facility])    Past Surgical History:  Procedure Laterality Date   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/21/2019   With root replacement   BENTALL PROCEDURE  07/21/2019   CARDIAC VALVE SURGERY     MECHANICAL AORTIC VALVE REPLACEMENT  07/21/2019   Redo of valve surgery   PACEMAKER IMPLANT     TEE WITHOUT CARDIOVERSION N/A 05/13/2019   Procedure: TRANSESOPHAGEAL ECHOCARDIOGRAM (TEE);  Surgeon: Fernand Denyse LABOR, MD;  Location: [facility] ORS;  Service: Cardiovascular;  Laterality: N/A;   TOTAL HIP ARTHROPLASTY Right 08/30/2019   Procedure: TOTAL HIP ARTHROPLASTY ANTERIOR APPROACH;  Surgeon: Jerri Kay HERO, MD;  Location: [facility] OR;  Service: Orthopedics;  Laterality: Right;   Patient Active Problem List   Diagnosis Date Noted   Status post mechanical aortic valve replacement 08/30/2019   Closed displaced fracture of right femoral neck (HCC) 08/30/2019   HIV (human immunodeficiency virus infection) (HCC) 05/20/2019   Bacteremia due to Staphylococcus aureus 05/10/2019   Sepsis (HCC) 05/09/2019   Leukocytosis 05/09/2019   Hypotension 05/09/2019   AKI (acute  kidney injury) (HCC) 05/09/2019    ONSET DATE: June 2024  REFERRING DIAG: I63.9 (ICD-10-CM) - CVA (cerebral vascular accident)  THERAPY DIAG:  Unsteadiness on feet  Other abnormalities of gait and mobility  Muscle weakness  Other lack of coordination  Rationale for Evaluation and Treatment: Rehabilitation  SUBJECTIVE:  SUBJECTIVE STATEMENT: Pt states he was given some exercises with the band for his arm strength by OT. Nothing else new to report.   From eval: Pt reports he had a CVA this past June. Pt states his recovery has been going well. Pt states R LE is still weak. Pt states home health PT ended last week. Pt states balancing is still hard as well as going up/down the stairs. Wants to be able to walk better. Is interested in returning to work if able.  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   Per chart pt admit 6/4-6/12, went to inpatient rehab 6/12-6/28 for his CVA  PAIN:  Are you having pain? No  PRECAUTIONS: None  RED FLAGS: None   WEIGHT BEARING RESTRICTIONS: No  FALLS: Has patient fallen in last 6 months? No  LIVING ENVIRONMENT: Lives with: lives alone Lives in: House/apartment Stairs: Yes: External: 12 steps; bilateral but cannot reach both Has following equipment at home: None  PLOF: Independent  PATIENT GOALS: Improve mobility for stairs and gait  quality  OBJECTIVE:  Note: Objective measures were completed at Evaluation unless otherwise noted.  DIAGNOSTIC FINDINGS: CTA Head 06/26/22: Partially occlusive thrombus of the left distal M1 segment.   COORDINATION: Decreased on R due to strength deficits  MUSCLE TONE: RLE: +1 with knee and hip extension, multibeat myoclonus x 5 sec with R ankle DF  LOWER EXTREMITY ROM:     Active  Right Eval Left Eval  Hip flexion    Hip extension    Hip abduction    Hip adduction    Hip internal rotation    Hip external rotation    Knee flexion    Knee extension    Ankle dorsiflexion    Ankle plantarflexion    Ankle inversion    Ankle eversion     (Blank rows = not tested)  LOWER EXTREMITY MMT:    MMT Right Eval Left Eval  Hip flexion 5 5  Hip extension 4- 5  Hip abduction 4 4  Hip adduction    Hip internal rotation    Hip external rotation    Knee flexion 3+ 5  Knee extension 4- 5  Ankle dorsiflexion 3+ in seated unable in standing 5  Ankle plantarflexion 4 5  Ankle inversion    Ankle eversion    (Blank rows = not tested)  BED MOBILITY:  Independent  TRANSFERS: Independent  STAIRS: Level of Assistance: Modified independence Stair Negotiation Technique: Step to Pattern with Single Rail on Right Number of Stairs: 4  Height of Stairs: 6  Comments: R LE circumduction to ascend, wide BOS  GAIT: Gait pattern: Decreased R ankle DF on heel strike (foot slaps) with poor toe off, R hip circumduction during swim with decreased knee extension, decreased R LE stance time, compensatory trunk lean to R during stance phase Distance walked: >200' Assistive device utilized: None Level of assistance: Complete Independence  FUNCTIONAL TESTS:  Eval: 5x STS 12.97 sec with heavy weightshift to L LE Berg Balance Test: 47/56 DGI: 14/24  01/07/23: 5x STS 11.12 sec Berg Balance Test: 52/56 DGI: 19/24       PATIENT  SURVEYS:  FOTO 66; predicted 76  VITALS  There were no  vitals filed for this visit.   TODAY'S TREATMENT:       Elliptical x 3 min fwd, 3 min bwd Standing heel/toe raise x20 Forward lunge onto airex foam pad 2x10 R&L clonus highly noticeable  Primal push up 3x10 SLS 2x30 with intermittent UE support During rest, PT checked pt's vitals due to pt with increased diaphoresis and SHOB. HR found to be in 120s. After 5 min rest remained elevated at >100 bpm. BP 133/91.  SLS ball roll fwd/bwd, and circles on other leg x10 R&L Blaze pods on objects of various heights, foot tapping 3x1' R&L working on weight bearing and coordination    PATIENT EDUCATION: Education details: Continue HEP, think about bracing options  Person educated: Patient Education method: Medical Illustrator Education comprehension: verbalized understanding and needs further education  HOME EXERCISE PROGRAM: Access Code: [email or web address] URL: [email or web address] Date: 01/07/2023 Prepared by: Maxwell Martorano April Earnie Starring  Exercises - Standing Gastroc Stretch  - 1 x daily - 7 x weekly - 2 sets - 30 sec hold - Standing Soleus Stretch  - 1 x daily - 7 x weekly - 2 sets - 30 sec hold - Walking Forward Lunge  - 1 x daily - 7 x weekly - 2 sets - 10 reps - Single Leg Stance  - 1 x daily - 7 x weekly - 2 sets - 10 reps - Runner's Step Up/Down  - 1 x daily - 7 x weekly - 3 sets - 10 reps - Forward Step Down with Heel Tap and Counter Support  - 1 x daily - 7 x weekly - 3 sets - 10 reps  GOALS: Goals reviewed with patient? Yes  SHORT TERM GOALS: Target date: 11/30/2022   Pt will be ind with HEP Baseline: Goal status: PARTIALLY MET -- not consistent 11/30/22   2.  Pt will be able to perform ankle DF in standing for heel strike Baseline:  Goal status: MET 11/14/22    LONG TERM GOALS: Target date: 03/04/2023   Pt will be ind with management and progression of HEP Baseline:  Goal status: IN PROGRESS  2.  Pt will be able to demonstrate decreased hip  circumduction with improved heel strike with or without R AFO for improved gait stabilty x 1000' for limited community distances Baseline: foot slap/decreased heel strike and toe off, R hip circumduction, decreased knee flexion during swing 01/07/23: Increased heel strike with foot drop brace, R hip circumduction still present Goal status: IN PROGRESS  3.  Pt will have improved FOTO score to >/=76 Baseline: 66 01/07/23: 62 Goal status: IN PROGRESS  4.  Pt will demo improved Berg Balance to >/=52/56 to be categorized as a low fall risk Baseline: 47/56 01/07/23: 52/56 Goal status: MET  5.  Pt will have improved DGI to >/=20/24 for low fall risk Baseline:  01/07/23: 19/24 Goal status: IN PROGRESS  6.  Pt will be able to ascend/descend 12 steps with normal reciprocal pattern without hand holds for improved entry/exit of home Baseline:  01/07/23: Reciprocal pattern with hand rails for safety Goal status: IN PROGRESS  ASSESSMENT:  CLINICAL IMPRESSION: Continued to work on strength and balance. Session limited due to pt becoming highly diaphoretic during therex.    OBJECTIVE IMPAIRMENTS: Abnormal gait, decreased activity tolerance, decreased balance, decreased coordination, decreased endurance, decreased mobility, difficulty walking, decreased strength, and improper body mechanics.   PLAN:  PT FREQUENCY: 2x/week  PT  DURATION: 8 weeks  PLANNED INTERVENTIONS: 97146- PT Re-evaluation, 97110-Therapeutic exercises, 97530- Therapeutic activity, [id number]- Neuromuscular re-education, 97535- Self Care, 02859- Manual therapy, [phone]- Gait training, [phone]- Orthotic Fit/training, [phone]- Aquatic Therapy, [phone]- Ionotophoresis 4mg /ml Dexamethasone , Patient/Family education, Balance training, Stair training, Taping, Cryotherapy, and Moist heat  PLAN FOR NEXT SESSION: Assess response to HEP. Lunges, multidirectional stepping/weight shifting, stairs/steps, and eccentric quad control. Gait training  with/without drop foot support. Continue ankle strengthening. R LE strengthening, balance, and coordination   London Tarnowski April Ma L [place], PT, DPT 01/30/2023, 2:04 PM

## 2023-01-31 ENCOUNTER — Ambulatory Visit: Admit: 2023-01-31 | Payer: MEDICARE | Attending: Cardiovascular Disease | Primary: Cardiovascular Disease

## 2023-02-01 ENCOUNTER — Telehealth: Payer: Self-pay | Admitting: Physical Therapy

## 2023-02-01 ENCOUNTER — Ambulatory Visit: Payer: 59 | Admitting: Physical Therapy

## 2023-02-01 NOTE — Telephone Encounter (Signed)
 Called pt in regards to missed PT visit. No pick up and unable to leave a voicemail. Pt to have another PT visit next week.   Olita Takeshita April Dell [place], PT, DPT

## 2023-02-06 ENCOUNTER — Ambulatory Visit: Payer: 59 | Admitting: Occupational Therapy

## 2023-02-06 ENCOUNTER — Ambulatory Visit: Payer: 59 | Admitting: Physical Therapy

## 2023-02-06 ENCOUNTER — Encounter: Payer: Self-pay | Admitting: Occupational Therapy

## 2023-02-06 DIAGNOSIS — M6281 Muscle weakness (generalized): Secondary | ICD-10-CM

## 2023-02-06 DIAGNOSIS — R2681 Unsteadiness on feet: Secondary | ICD-10-CM

## 2023-02-06 DIAGNOSIS — R278 Other lack of coordination: Secondary | ICD-10-CM

## 2023-02-06 DIAGNOSIS — R2689 Other abnormalities of gait and mobility: Secondary | ICD-10-CM

## 2023-02-06 NOTE — Therapy (Signed)
 OUTPATIENT OCCUPATIONAL THERAPY NEURO TREATMENT  Patient Name: James Ferrell MRN: [id number] DOB:05-10-1975, 48 y.o., male Today's Date: 02/06/2023  PCP: Herlinda Long, MD REFERRING PROVIDER: Herlinda Long, MD  END OF SESSION:  OT End of Session - 02/06/23 1401     Visit Number 3    Number of Visits 12    Date for OT Re-Evaluation 03/22/23    Authorization Type [employer] Dual complete -covered at 100%    Progress Note Due on Visit 10    OT Start Time 1400    OT Stop Time 1445    OT Time Calculation (min) 45 min    Activity Tolerance Patient tolerated treatment well    Behavior During Therapy [facility] for tasks assessed/performed             Past Medical History:  Diagnosis Date   CHB (complete heart block) (HCC) 2020   Endocarditis of aortic valve    Feb 2020 and April 2021   H/O mechanical aortic valve replacement    HIV (human immunodeficiency virus infection) (HCC)    Stroke [facility])    Past Surgical History:  Procedure Laterality Date   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/21/2019   With root replacement   BENTALL PROCEDURE  07/21/2019   CARDIAC VALVE SURGERY     MECHANICAL AORTIC VALVE REPLACEMENT  07/21/2019   Redo of valve surgery   PACEMAKER IMPLANT     TEE WITHOUT CARDIOVERSION N/A 05/13/2019   Procedure: TRANSESOPHAGEAL ECHOCARDIOGRAM (TEE);  Surgeon: Cherrie Cornwall, MD;  Location: [facility] ORS;  Service: Cardiovascular;  Laterality: N/A;   TOTAL HIP ARTHROPLASTY Right 08/30/2019   Procedure: TOTAL HIP ARTHROPLASTY ANTERIOR APPROACH;  Surgeon: Wes Hamman, MD;  Location: [facility] OR;  Service: Orthopedics;  Laterality: Right;   Patient Active Problem List   Diagnosis Date Noted   Status post mechanical aortic valve replacement 08/30/2019   Closed displaced fracture of right femoral neck (HCC) 08/30/2019   HIV (human immunodeficiency virus infection) (HCC) 05/20/2019   Bacteremia due to Staphylococcus aureus 05/10/2019   Sepsis (HCC) 05/09/2019   Leukocytosis  05/09/2019   Hypotension 05/09/2019   AKI (acute kidney injury) (HCC) 05/09/2019    ONSET DATE: 01/01/2023  REFERRING DIAG: W09.81 (ICD-10-CM) - Personal history of transient ischemic attack (TIA), and cerebral infarction without residual deficits  THERAPY DIAG:  Muscle weakness  Other lack of coordination  Unsteadiness on feet  Rationale for Evaluation and Treatment: Rehabilitation  SUBJECTIVE:   SUBJECTIVE STATEMENT: No pain and no falls.   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    Per chart pt admit 6/4-6/12, went to inpatient rehab 6/12-6/28 for his CVA  PRECAUTIONS: Fall and ICD/Pacemaker, HIV +  WEIGHT BEARING RESTRICTIONS: No  PAIN:  Are you having pain? No    FALLS: Has patient fallen in last 6 months? No   LIVING ENVIRONMENT: Lives with: lives alone Lives in: 2nd floor apartment Stairs: Yes: External: 12 steps; bilateral but cannot reach both Has following equipment at home: None   PLOF: Independent w/ ADLS, on disability d/t heart condition  PATIENT  GOALS: improve coordination Rt hand  OBJECTIVE:  Note: Objective measures were completed at Evaluation unless otherwise noted.  HAND DOMINANCE: Right  ADLs: Eating: independent Grooming: 10% Rt hand, majority w/ Lt hand UB Dressing: independent LB Dressing: some difficulty with buttons and tying shoes, mod I  Toileting: independent Bathing: independent Tub Shower transfers: mod I  Equipment: Grab bars  IADLs: Shopping: mod I (neighbor takes  him) Light housekeeping: mod I  Meal Prep: mod I  Community mobility: doesn't have car yet Medication management: independent Financial management: independent Handwriting: 100% legible and Moderate micrographia  MOBILITY STATUS: Independent   FUNCTIONAL OUTCOME MEASURES: FOTO: 61 (RUE)   UPPER EXTREMITY ROM:  BUE AROM WNL's   UPPER EXTREMITY MMT:   RUE grossly 4/5  HAND FUNCTION: Grip strength: Right: 66.1 lbs; Left: 112.2 lbs  COORDINATION: 9 Hole Peg test: Right: 36.35 sec; Left: 22.26 sec  SENSATION: WFL Light touch: WFL  EDEMA: none   COGNITION: Overall cognitive status: Within functional limits for tasks assessed  VISION: Subjective report: denies change Baseline vision: No visual deficits Visual history:  none  PERCEPTION: Not tested  PRAXIS: Not tested    TODAY'S TREATMENT:                                                                                                                              DATE: 02/06/23  Reviewed theraband HEP - pt required min cues for each ex for proper positioning.  Encouraged pt to continue with coordination HEP as he has only done once since last session  Discussed proper reaching pattern RUE, then practiced functional reaching to retrieve cones from mid level shelf RUE then replace onto higher shelf.  Pt encouraged to use RUE to retrieve and put away light things (groceries, dishes, etc)  Pt copying small peg design with Rt hand for [facility] and visual perceptual skills. Pt copying simple design at 75% accuracy and min difficulty Rt hand. Min cues to correct errors  Practiced writing name and 3 sentences in print with 100% legibility, mild micrographia  Pt has developed some learned non use RUE and encouraged to use RUE for all safe functional tasks (eating, most grooming, light reaching) and practice writing.   PATIENT EDUCATION: Education details: review of theraband HEP   Person educated: Patient Education method:  Explanation, Demonstration, Verbal cues, and Handouts Education comprehension: verbalized understanding, returned demonstration, verbal cues required, and needs further education  HOME EXERCISE PROGRAM: 01/07/23: coordination HEP  01/30/23: putty and theraband HEP     GOALS: Goals reviewed with patient? Yes  SHORT TERM GOALS: Target date: 02/22/23 (extended d/t pt unable to return again until January)   Independent with HEP for coordination Rt hand Baseline: Goal status: MET at eval  2.  Independent with putty HEP for Rt hand and theraband HEP for RUE strengthening Baseline:  Goal status: MET   LONG TERM GOALS: Target date: 03/22/23  Improve coordination as evidenced by performing  9 hole peg test in under 30 sec Baseline: 36 sec Goal status: INITIAL  2.  Grip strength Rt hand to increase to 71 lbs or greater Rt hand Baseline: 66 lbs Goal status: INITIAL  3.  Pt to groom 50% with Rt dominant hand Baseline: 10% Goal status: INITIAL  4.  FOTO to increase from 61 to 65 score for RUE Baseline:  Goal status: INITIAL    ASSESSMENT:  CLINICAL IMPRESSION: Patient seen today for occupational therapy treatment for CVA. Hx includes heart issues w/ surgeries, pacemaker, and HIV+. Pt with some learned non use of RUE however has good movement for most tasks. Patient requires review of HEP's for proper positioning and carryover at home. Pt has met STG's. Pt would continue to benefit from skilled OT services in the outpatient setting to work on impairments as noted below to help pt return to PLOF as able.     PERFORMANCE DEFICITS: in functional skills including IADLs, coordination, strength, Fine motor control, endurance, and UE functional use.   IMPAIRMENTS: are limiting patient from IADLs.   CO-MORBIDITIES: may have co-morbidities  that affects occupational performance. Patient will benefit from skilled OT to address above impairments and improve overall function.  MODIFICATION OR  ASSISTANCE TO COMPLETE EVALUATION: No modification of tasks or assist necessary to complete an evaluation.  OT OCCUPATIONAL PROFILE AND HISTORY: Problem focused assessment: Including review of records relating to presenting problem.  CLINICAL DECISION MAKING: LOW - limited treatment options, no task modification necessary  REHAB POTENTIAL: Good  EVALUATION COMPLEXITY: Low    PLAN:  OT FREQUENCY: 1-2x/week  OT DURATION: 8 weeks  PLANNED INTERVENTIONS: 97535 self care/ADL training, 16109 therapeutic exercise, 97530 therapeutic activity, 97112 neuromuscular re-education, 97140 manual therapy, 97039 fluidotherapy, passive range of motion, energy conservation, patient/family education, and DME and/or AE instructions  RECOMMENDED OTHER SERVICES: none at this time (currently seeing P.T.)  CONSULTED AND AGREED WITH PLAN OF CARE: Patient  PLAN FOR NEXT SESSION: practice simulated cooking - flipping over "egg, pancake, hamburger", practice cutting "food" (may need built up handle on knife), UBE, continue functional reaching/coordination, A/E recommendations (pot stabilizer, long silicone oven mitts, veggie chopper, ? oven rack pull, etc)    Velinda Getting, OT 02/06/2023, 2:01 PM

## 2023-02-06 NOTE — Therapy (Signed)
 OUTPATIENT PHYSICAL THERAPY TREATMENT    Patient Name: James Ferrell MRN: [id number] DOB:January 19, 1976, 48 y.o., male Today's Date: 02/06/2023   PCP: Burke Carolus provider REFERRING PROVIDER: Loral Roch, MD  END OF SESSION:  PT End of Session - 02/06/23 1322     Visit Number 18    Date for PT Re-Evaluation 03/04/23    Authorization Type [employer] Medicare    Progress Note Due on Visit 20    PT Start Time 1320    PT Stop Time 1400    PT Time Calculation (min) 40 min    Activity Tolerance Patient tolerated treatment well    Behavior During Therapy [facility] for tasks assessed/performed                Past Medical History:  Diagnosis Date   CHB (complete heart block) (HCC) 2020   Endocarditis of aortic valve    Feb 2020 and April 2021   H/O mechanical aortic valve replacement    HIV (human immunodeficiency virus infection) (HCC)    Stroke [facility])    Past Surgical History:  Procedure Laterality Date   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/21/2019   With root replacement   BENTALL PROCEDURE  07/21/2019   CARDIAC VALVE SURGERY     MECHANICAL AORTIC VALVE REPLACEMENT  07/21/2019   Redo of valve surgery   PACEMAKER IMPLANT     TEE WITHOUT CARDIOVERSION N/A 05/13/2019   Procedure: TRANSESOPHAGEAL ECHOCARDIOGRAM (TEE);  Surgeon: Cherrie Cornwall, MD;  Location: [facility] ORS;  Service: Cardiovascular;  Laterality: N/A;   TOTAL HIP ARTHROPLASTY Right 08/30/2019   Procedure: TOTAL HIP ARTHROPLASTY ANTERIOR APPROACH;  Surgeon: Wes Hamman, MD;  Location: [facility] OR;  Service: Orthopedics;  Laterality: Right;   Patient Active Problem List   Diagnosis Date Noted   Status post mechanical aortic valve replacement 08/30/2019   Closed displaced fracture of right femoral neck (HCC) 08/30/2019   HIV (human immunodeficiency virus infection) (HCC) 05/20/2019   Bacteremia due to Staphylococcus aureus 05/10/2019   Sepsis (HCC) 05/09/2019   Leukocytosis 05/09/2019   Hypotension 05/09/2019   AKI (acute  kidney injury) (HCC) 05/09/2019    ONSET DATE: June 2024  REFERRING DIAG: I63.9 (ICD-10-CM) - CVA (cerebral vascular accident)  THERAPY DIAG:  Other lack of coordination  Muscle weakness  Unsteadiness on feet  Other abnormalities of gait and mobility  Muscle weakness (generalized)  Rationale for Evaluation and Treatment: Rehabilitation  SUBJECTIVE:  SUBJECTIVE STATEMENT: Pt reports he has been doing his exercises some.   From eval: Pt reports he had a CVA this past June. Pt states his recovery has been going well. Pt states R LE is still weak. Pt states home health PT ended last week. Pt states balancing is still hard as well as going up/down the stairs. Wants to be able to walk better. Is interested in returning to work if able.  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   Per chart pt admit 6/4-6/12, went to inpatient rehab 6/12-6/28 for his CVA  PAIN:  Are you having pain? No  PRECAUTIONS: None  RED FLAGS: None   WEIGHT BEARING RESTRICTIONS: No  FALLS: Has patient fallen in last 6 months? No  LIVING ENVIRONMENT: Lives with: lives alone Lives in: House/apartment Stairs: Yes: External: 12 steps; bilateral but cannot reach both Has following equipment at home: None  PLOF: Independent  PATIENT GOALS: Improve mobility for stairs and gait quality  OBJECTIVE:  Note: Objective  measures were completed at Evaluation unless otherwise noted.  DIAGNOSTIC FINDINGS: CTA Head 06/26/22: Partially occlusive thrombus of the left distal M1 segment.   COORDINATION: Decreased on R due to strength deficits  MUSCLE TONE: RLE: +1 with knee and hip extension, multibeat myoclonus x 5 sec with R ankle DF  LOWER EXTREMITY ROM:     Active  Right Eval Left Eval  Hip flexion    Hip extension    Hip abduction    Hip adduction    Hip internal rotation    Hip external rotation    Knee flexion    Knee extension    Ankle dorsiflexion    Ankle plantarflexion    Ankle inversion    Ankle eversion     (Blank rows = not tested)  LOWER EXTREMITY MMT:    MMT Right Eval Left Eval  Hip flexion 5 5  Hip extension 4- 5  Hip abduction 4 4  Hip adduction    Hip internal rotation    Hip external rotation    Knee flexion 3+ 5  Knee extension 4- 5  Ankle dorsiflexion 3+ in seated unable in standing 5  Ankle plantarflexion 4 5  Ankle inversion    Ankle eversion    (Blank rows = not tested)  BED MOBILITY:  Independent  TRANSFERS: Independent  STAIRS: Level of Assistance: Modified independence Stair Negotiation Technique: Step to Pattern with Single Rail on Right Number of Stairs: 4  Height of Stairs: 6"  Comments: R LE circumduction to ascend, wide BOS  GAIT: Gait pattern: Decreased R ankle DF on heel strike (foot slaps) with poor toe off, R hip circumduction during swim with decreased knee extension, decreased R LE stance time, compensatory trunk lean to R during stance phase Distance walked: >200' Assistive device utilized: None Level of assistance: Complete Independence  FUNCTIONAL TESTS:  Eval: 5x STS 12.97 sec with heavy weightshift to L LE Berg Balance Test: 47/56 DGI: 14/24  01/07/23: 5x STS 11.12 sec Berg Balance Test: 52/56 DGI: 19/24       PATIENT SURVEYS:  FOTO 66; predicted 76  VITALS  There were no  vitals filed for this visit.   TODAY'S  TREATMENT:       Elliptical x 3 min fwd, 3 min bwd Forward lunge onto airex foam pad 2x10 R&L clonus highly noticeable  Half kneel holds 2x30" Tall kneel to half kneel 2x10 Heel raise with knees straight and then bent  2x10 Blaze pods on objects of various heights, foot tapping 3x1' R&L working on weight bearing and coordination    PATIENT EDUCATION: Education details: Continue HEP, think about bracing options  Person educated: Patient Education method: Medical illustrator Education comprehension: verbalized understanding and needs further education  HOME EXERCISE PROGRAM: Access Code: [email or web address] URL: [email or web address] Date: 01/07/2023 Prepared by: Shaquina Gillham April Erman Hayward  Exercises - Standing Gastroc Stretch  - 1 x daily - 7 x weekly - 2 sets - 30 sec hold - Standing Soleus Stretch  - 1 x daily - 7 x weekly - 2 sets - 30 sec hold - Walking Forward Lunge  - 1 x daily - 7 x weekly - 2 sets - 10 reps - Single Leg Stance  - 1 x daily - 7 x weekly - 2 sets - 10 reps - Runner's Step Up/Down  - 1 x daily - 7 x weekly - 3 sets - 10 reps - Forward Step Down with Heel Tap and Counter Support  - 1 x daily - 7 x weekly - 3 sets - 10 reps  GOALS: Goals reviewed with patient? Yes  SHORT TERM GOALS: Target date: 11/30/2022   Pt will be ind with HEP Baseline: Goal status: PARTIALLY MET -- not consistent 11/30/22   2.  Pt will be able to perform ankle DF in standing for heel strike Baseline:  Goal status: MET 11/14/22    LONG TERM GOALS: Target date: 03/04/2023   Pt will be ind with management and progression of HEP Baseline:  Goal status: IN PROGRESS  2.  Pt will be able to demonstrate decreased hip circumduction with improved heel strike with or without R AFO for improved gait stabilty x 1000' for limited community distances Baseline: foot slap/decreased heel strike and toe off, R hip circumduction, decreased knee flexion during swing 01/07/23:  Increased heel strike with foot drop brace, R hip circumduction still present Goal status: IN PROGRESS  3.  Pt will have improved FOTO score to >/=76 Baseline: 66 01/07/23: 62 Goal status: IN PROGRESS  4.  Pt will demo improved Berg Balance to >/=52/56 to be categorized as a low fall risk Baseline: 47/56 01/07/23: 52/56 Goal status: MET  5.  Pt will have improved DGI to >/=20/24 for low fall risk Baseline:  01/07/23: 19/24 Goal status: IN PROGRESS  6.  Pt will be able to ascend/descend 12 steps with normal reciprocal pattern without hand holds for improved entry/exit of home Baseline:  01/07/23: Reciprocal pattern with hand rails for safety Goal status: IN PROGRESS  ASSESSMENT:  CLINICAL IMPRESSION: Continued to work on strength and balance. Increased clonus most noted with heel raise + knee bent. Improving lunges with less instability but still remains challenged due to clonus.    OBJECTIVE IMPAIRMENTS: Abnormal gait, decreased activity tolerance, decreased balance, decreased coordination, decreased endurance, decreased mobility, difficulty walking, decreased strength, and improper body mechanics.   PLAN:  PT FREQUENCY: 2x/week  PT DURATION: 8 weeks  PLANNED INTERVENTIONS: 97146- PT Re-evaluation, 97110-Therapeutic exercises, 97530- Therapeutic activity, [id number]- Neuromuscular re-education, 97535- Self Care, 54098- Manual therapy, [phone]- Gait training, [phone]- Orthotic Fit/training, [phone]- Aquatic Therapy, [phone]- Ionotophoresis 4mg /ml Dexamethasone , Patient/Family  education, Balance training, Stair training, Taping, Cryotherapy, and Moist heat  PLAN FOR NEXT SESSION: Assess response to HEP. Lunges, multidirectional stepping/weight shifting, stairs/steps, and eccentric quad control. Gait training with/without drop foot support. Continue ankle strengthening. R LE strengthening, balance, and coordination   Atiana Levier April Ma L Addison, PT, DPT 02/06/2023, 1:23 PM

## 2023-02-07 ENCOUNTER — Ambulatory Visit: Admit: 2023-02-07 | Discharge: 2023-02-08 | Payer: MEDICARE

## 2023-02-07 DIAGNOSIS — Z45018 Encounter for adjustment and management of other part of cardiac pacemaker: Principal | ICD-10-CM

## 2023-02-07 NOTE — Unmapped (Unsigned)
Attempted to reach patient regarding disconnected home transmitter / Phone app.   No answer, number appears to be blocked.   Spoke with his Emergency Contact Aldean Ast (Aunt) and requested that the patient call us regarding the disconnected home monitor.   Provided our call back number  Sent Text to patient regarding disconnected monitoring Phone app.

## 2023-02-08 ENCOUNTER — Ambulatory Visit: Payer: 59 | Admitting: Physical Therapy

## 2023-02-13 ENCOUNTER — Ambulatory Visit: Payer: 59 | Admitting: Occupational Therapy

## 2023-02-13 ENCOUNTER — Encounter: Payer: Self-pay | Admitting: Occupational Therapy

## 2023-02-13 ENCOUNTER — Ambulatory Visit: Payer: 59 | Admitting: Physical Therapy

## 2023-02-13 DIAGNOSIS — R2689 Other abnormalities of gait and mobility: Secondary | ICD-10-CM

## 2023-02-13 DIAGNOSIS — R2681 Unsteadiness on feet: Secondary | ICD-10-CM

## 2023-02-13 DIAGNOSIS — R278 Other lack of coordination: Secondary | ICD-10-CM

## 2023-02-13 DIAGNOSIS — M6281 Muscle weakness (generalized): Secondary | ICD-10-CM

## 2023-02-13 NOTE — Therapy (Signed)
OUTPATIENT OCCUPATIONAL THERAPY NEURO TREATMENT  Patient Name: James Ferrell MRN: [id number] DOB:[birth year], 48 y.o., male Today's Date: 02/13/2023  PCP: Dorris Carnes, MD REFERRING PROVIDER: Dorris Carnes, MD  END OF SESSION:  OT End of Session - 02/13/23 1404     Visit Number 4    Number of Visits 12    Date for OT Re-Evaluation 03/22/23    Authorization Type [employer] Dual complete -covered at 100%    Progress Note Due on Visit 10    OT Start Time 1400    OT Stop Time 1445    OT Time Calculation (min) 45 min    Activity Tolerance Patient tolerated treatment well    Behavior During Therapy [facility] for tasks assessed/performed             Past Medical History:  Diagnosis Date   CHB (complete heart block) (HCC) 2020   Endocarditis of aortic valve    Feb 2020 and April 2021   H/O mechanical aortic valve replacement    HIV (human immunodeficiency virus infection) (HCC)    Stroke [facility])    Past Surgical History:  Procedure Laterality Date   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/21/2019   With root replacement   BENTALL PROCEDURE  07/21/2019   CARDIAC VALVE SURGERY     MECHANICAL AORTIC VALVE REPLACEMENT  07/21/2019   Redo of valve surgery   PACEMAKER IMPLANT     TEE WITHOUT CARDIOVERSION N/A 05/13/2019   Procedure: TRANSESOPHAGEAL ECHOCARDIOGRAM (TEE);  Surgeon: Laurier Nancy, MD;  Location: [facility] ORS;  Service: Cardiovascular;  Laterality: N/A;   TOTAL HIP ARTHROPLASTY Right 08/30/2019   Procedure: TOTAL HIP ARTHROPLASTY ANTERIOR APPROACH;  Surgeon: Tarry Kos, MD;  Location: [facility] OR;  Service: Orthopedics;  Laterality: Right;   Patient Active Problem List   Diagnosis Date Noted   Status post mechanical aortic valve replacement 08/30/2019   Closed displaced fracture of right femoral neck (HCC) 08/30/2019   HIV (human immunodeficiency virus infection) (HCC) 05/20/2019   Bacteremia due to Staphylococcus aureus 05/10/2019   Sepsis (HCC) 05/09/2019   Leukocytosis  05/09/2019   Hypotension 05/09/2019   AKI (acute kidney injury) (HCC) 05/09/2019    ONSET DATE: 01/01/2023  REFERRING DIAG: W23.76 (ICD-10-CM) - Personal history of transient ischemic attack (TIA), and cerebral infarction without residual deficits  THERAPY DIAG:  Other lack of coordination  Muscle weakness  Unsteadiness on feet  Rationale for Evaluation and Treatment: Rehabilitation  SUBJECTIVE:   SUBJECTIVE STATEMENT: No pain and no falls.   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    Per chart pt admit 6/4-6/12, went to inpatient rehab 6/12-6/28 for his CVA  PRECAUTIONS: Fall and ICD/Pacemaker, HIV +  WEIGHT BEARING RESTRICTIONS: No  PAIN:  Are you having pain? No    FALLS: Has patient fallen in last 6 months? No   LIVING ENVIRONMENT: Lives with: lives alone Lives in: 2nd floor apartment Stairs: Yes: External: 12 steps; bilateral but cannot reach both Has following equipment at home: None   PLOF: Independent w/ ADLS, on disability d/t heart condition  PATIENT  GOALS: improve coordination Rt hand  OBJECTIVE:  Note: Objective measures were completed at Evaluation unless otherwise noted.  HAND DOMINANCE: Right  ADLs: Eating: independent Grooming: 10% Rt hand, majority w/ Lt hand UB Dressing: independent LB Dressing: some difficulty with buttons and tying shoes, mod I  Toileting: independent Bathing: independent Tub Shower transfers: mod I  Equipment: Grab bars  IADLs: Shopping: mod I (neighbor takes  him) Light housekeeping: mod I  Meal Prep: mod I  Community mobility: doesn't have car yet Medication management: independent Financial management: independent Handwriting: 100% legible and Moderate micrographia  MOBILITY STATUS: Independent   FUNCTIONAL OUTCOME MEASURES: FOTO: 61 (RUE)   UPPER EXTREMITY ROM:  BUE AROM WNL's   UPPER EXTREMITY MMT:   RUE grossly 4/5  HAND FUNCTION: Grip strength: Right: 66.1 lbs; Left: 112.2 lbs  COORDINATION: 9 Hole Peg test: Right: 36.35 sec; Left: 22.26 sec  SENSATION: WFL Light touch: WFL  EDEMA: none   COGNITION: Overall cognitive status: Within functional limits for tasks assessed  VISION: Subjective report: denies change Baseline vision: No visual deficits Visual history:  none  PERCEPTION: Not tested  PRAXIS: Not tested    TODAY'S TREATMENT:                                                                                                                              DATE: 02/13/23  Practiced cutting "food" with Rt hand using butter knife - pt did well w/ min cues, however preferred built up handle for increased control and comfort. Issued red foam to place on knife.   Practiced flipping over "food" in frying pan using spatula w/ cues to prevent burns and for success flipping. Pt instructed to also practice at home w/ piece of bread w/ stove OFF until pt feels competent in doing safely.   Discussed A/E recommendations that might be helpful/improve cooking tasks with greater ease and safety including: cut resistant glove, pot holder/stabilizer, oven rack pull, veggie chopper, and long silicone oven mitts. Pt provided handouts and told where to purchase.   UBE x 8 min, level 3 for normal reciprocal movement pattern and UB conditioning   PATIENT EDUCATION: Education details: see above Person educated: Patient Education method: Explanation, Demonstration, Verbal cues, and Handouts Education comprehension: verbalized  understanding, returned demonstration, verbal cues required, and needs further education  HOME EXERCISE PROGRAM: 01/07/23: coordination HEP  01/30/23: putty and theraband HEP   02/13/23: A/E recommendations   GOALS: Goals reviewed with patient? Yes  SHORT TERM GOALS: Target date: 02/22/23 (extended d/t pt unable to return again until January)   Independent with HEP for coordination Rt hand Baseline: Goal status: MET at eval  2.  Independent with putty HEP for Rt hand and theraband HEP for RUE strengthening Baseline:  Goal status: MET   LONG TERM GOALS: Target date: 03/22/23  Improve coordination as evidenced by performing 9 hole peg test in under 30 sec Baseline: 36 sec Goal status: INITIAL  2.  Grip strength Rt hand to increase to 71 lbs or greater Rt hand Baseline: 66 lbs Goal status: INITIAL  3.  Pt to groom 50% with Rt dominant hand Baseline: 10% Goal status: INITIAL  4.  FOTO to increase from 61 to 65 score for RUE Baseline:  Goal status: INITIAL    ASSESSMENT:  CLINICAL IMPRESSION: Patient seen today for occupational therapy treatment for CVA. Hx includes heart issues w/ surgeries, pacemaker, and HIV+. Pt with some learned non use of RUE however has good movement for most tasks. Patient requires review of HEP's for proper positioning and carryover at home. Pt has met STG's. Pt would continue to benefit from skilled OT services in the outpatient setting to work on impairments as noted below to help pt return to PLOF as able.     PERFORMANCE DEFICITS: in functional skills including IADLs, coordination, strength, Fine motor control, endurance, and UE functional use.   IMPAIRMENTS: are limiting patient from IADLs.   CO-MORBIDITIES: may have co-morbidities  that affects occupational performance. Patient will benefit from skilled OT to address above impairments and improve overall function.  MODIFICATION OR ASSISTANCE TO COMPLETE EVALUATION: No modification of tasks  or assist necessary to complete an evaluation.  OT OCCUPATIONAL PROFILE AND HISTORY: Problem focused assessment: Including review of records relating to presenting problem.  CLINICAL DECISION MAKING: LOW - limited treatment options, no task modification necessary  REHAB POTENTIAL: Good  EVALUATION COMPLEXITY: Low    PLAN:  OT FREQUENCY: 1-2x/week  OT DURATION: 8 weeks  PLANNED INTERVENTIONS: 97535 self care/ADL training, 16109 therapeutic exercise, 97530 therapeutic activity, 97112 neuromuscular re-education, 97140 manual therapy, 97039 fluidotherapy, passive range of motion, energy conservation, patient/family education, and DME and/or AE instructions  RECOMMENDED OTHER SERVICES: none at this time (currently seeing P.T.)  CONSULTED AND AGREED WITH PLAN OF CARE: Patient  PLAN FOR NEXT SESSION: continue functional reaching/coordination (peg design on vertical surface), Scapula stabilization ex's (prone - scapula retraction w/ arms in abduction, planks on elbows/forearms, quadraped, wall push ups)    Sheran Lawless, OT 02/13/2023, 2:05 PM

## 2023-02-13 NOTE — Therapy (Signed)
OUTPATIENT PHYSICAL THERAPY TREATMENT    Patient Name: James Ferrell MRN: [id number] DOB:10/14/1975, 48 y.o., male Today's Date: 02/13/2023   PCP: Lucienne Minks provider REFERRING PROVIDER: Idelia Salm, MD  END OF SESSION:  PT End of Session - 02/13/23 1323     Visit Number 19    Date for PT Re-Evaluation 03/04/23    Authorization Type [employer] Medicare    Progress Note Due on Visit 20    PT Start Time 1321    PT Stop Time 1400    PT Time Calculation (min) 39 min    Activity Tolerance Patient tolerated treatment well    Behavior During Therapy [facility] for tasks assessed/performed                 Past Medical History:  Diagnosis Date   CHB (complete heart block) (HCC) 2020   Endocarditis of aortic valve    Feb 2020 and April 2021   H/O mechanical aortic valve replacement    HIV (human immunodeficiency virus infection) (HCC)    Stroke [facility])    Past Surgical History:  Procedure Laterality Date   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/21/2019   With root replacement   BENTALL PROCEDURE  07/21/2019   CARDIAC VALVE SURGERY     MECHANICAL AORTIC VALVE REPLACEMENT  07/21/2019   Redo of valve surgery   PACEMAKER IMPLANT     TEE WITHOUT CARDIOVERSION N/A 05/13/2019   Procedure: TRANSESOPHAGEAL ECHOCARDIOGRAM (TEE);  Surgeon: Laurier Nancy, MD;  Location: [facility] ORS;  Service: Cardiovascular;  Laterality: N/A;   TOTAL HIP ARTHROPLASTY Right 08/30/2019   Procedure: TOTAL HIP ARTHROPLASTY ANTERIOR APPROACH;  Surgeon: Tarry Kos, MD;  Location: [facility] OR;  Service: Orthopedics;  Laterality: Right;   Patient Active Problem List   Diagnosis Date Noted   Status post mechanical aortic valve replacement 08/30/2019   Closed displaced fracture of right femoral neck (HCC) 08/30/2019   HIV (human immunodeficiency virus infection) (HCC) 05/20/2019   Bacteremia due to Staphylococcus aureus 05/10/2019   Sepsis (HCC) 05/09/2019   Leukocytosis 05/09/2019   Hypotension 05/09/2019   AKI  (acute kidney injury) (HCC) 05/09/2019    ONSET DATE: June 2024  REFERRING DIAG: I63.9 (ICD-10-CM) - CVA (cerebral vascular accident)  THERAPY DIAG:  Muscle weakness  Other lack of coordination  Unsteadiness on feet  Other abnormalities of gait and mobility  Muscle weakness (generalized)  Rationale for Evaluation and Treatment: Rehabilitation  SUBJECTIVE:  SUBJECTIVE STATEMENT: Pt states he got a new toe up brace.  From eval: Pt reports he had a CVA this past June. Pt states his recovery has been going well. Pt states R LE is still weak. Pt states home health PT ended last week. Pt states balancing is still hard as well as going up/down the stairs. Wants to be able to walk better. Is interested in returning to work if able.  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   Per chart pt admit 6/4-6/12, went to inpatient rehab 6/12-6/28 for his CVA  PAIN:  Are you having pain? No  PRECAUTIONS: None  RED FLAGS: None   WEIGHT BEARING RESTRICTIONS: No  FALLS: Has patient fallen in last 6 months? No  LIVING ENVIRONMENT: Lives with: lives alone Lives in: House/apartment Stairs: Yes: External: 12 steps; bilateral but cannot reach both Has following equipment at home: None  PLOF: Independent  PATIENT GOALS: Improve mobility for stairs and gait quality  OBJECTIVE:  Note: Objective  measures were completed at Evaluation unless otherwise noted.  DIAGNOSTIC FINDINGS: CTA Head 06/26/22: Partially occlusive thrombus of the left distal M1 segment.   COORDINATION: Decreased on R due to strength deficits  MUSCLE TONE: RLE: +1 with knee and hip extension, multibeat myoclonus x 5 sec with R ankle DF  LOWER EXTREMITY ROM:     Active  Right Eval Left Eval  Hip flexion    Hip extension    Hip abduction    Hip adduction    Hip internal rotation    Hip external rotation    Knee flexion    Knee extension    Ankle dorsiflexion    Ankle plantarflexion    Ankle inversion    Ankle eversion     (Blank rows = not tested)  LOWER EXTREMITY MMT:    MMT Right Eval Left Eval  Hip flexion 5 5  Hip extension 4- 5  Hip abduction 4 4  Hip adduction    Hip internal rotation    Hip external rotation    Knee flexion 3+ 5  Knee extension 4- 5  Ankle dorsiflexion 3+ in seated unable in standing 5  Ankle plantarflexion 4 5  Ankle inversion    Ankle eversion    (Blank rows = not tested)  BED MOBILITY:  Independent  TRANSFERS: Independent  STAIRS: Level of Assistance: Modified independence Stair Negotiation Technique: Step to Pattern with Single Rail on Right Number of Stairs: 4  Height of Stairs: 6"  Comments: R LE circumduction to ascend, wide BOS  GAIT: Gait pattern: Decreased R ankle DF on heel strike (foot slaps) with poor toe off, R hip circumduction during swim with decreased knee extension, decreased R LE stance time, compensatory trunk lean to R during stance phase Distance walked: >200' Assistive device utilized: None Level of assistance: Complete Independence  FUNCTIONAL TESTS:  Eval: 5x STS 12.97 sec with heavy weightshift to L LE Berg Balance Test: 47/56 DGI: 14/24  01/07/23: 5x STS 11.12 sec Berg Balance Test: 52/56 DGI: 19/24       PATIENT SURVEYS:  FOTO 66; predicted 76  VITALS  There were no vitals  filed for this visit.   TODAY'S  TREATMENT:       Treadmill x5 min fwd at 1.2 mph; x5 min fwd on decline at 2% at 0.8 mph focusing on foot and hip control to decrease foot ER and hip circumduction  Eccentric step down 2x10 from 6" step first with and then without UE support Side eccentric step down 2x10 from 6" step first with and then without UE support Forward lunge onto airex foam pad 2x10 R&L clonus highly noticeable  Sitting ankle inv/ev 2x10 SLS on airex 3x30" with intermittent UE support   PATIENT EDUCATION: Education details: Continue HEP, think about bracing options  Person educated: Patient Education method: Explanation and Demonstration Education comprehension: verbalized understanding and needs further education  HOME EXERCISE PROGRAM: Access Code: [email or web address] URL: [email or web address] Date: 01/07/2023 Prepared by: Vernon Prey April Kirstie Peri  Exercises - Standing Gastroc Stretch  - 1 x daily - 7 x weekly - 2 sets - 30 sec hold - Standing Soleus Stretch  - 1 x daily - 7 x weekly - 2 sets - 30 sec hold - Walking Forward Lunge  - 1 x daily - 7 x weekly - 2 sets - 10 reps - Single Leg Stance  - 1 x daily - 7 x weekly - 2 sets - 10 reps - Runner's Step Up/Down  - 1 x daily - 7 x weekly - 3 sets - 10 reps - Forward Step Down with Heel Tap and Counter Support  - 1 x daily - 7 x weekly - 3 sets - 10 reps  GOALS: Goals reviewed with patient? Yes  SHORT TERM GOALS: Target date: 11/30/2022   Pt will be ind with HEP Baseline: Goal status: PARTIALLY MET -- not consistent 11/30/22   2.  Pt will be able to perform ankle DF in standing for heel strike Baseline:  Goal status: MET 11/14/22    LONG TERM GOALS: Target date: 03/04/2023   Pt will be ind with management and progression of HEP Baseline:  Goal status: IN PROGRESS  2.  Pt will be able to demonstrate decreased hip circumduction with improved heel strike with or without R AFO for improved gait stabilty x 1000' for limited community  distances Baseline: foot slap/decreased heel strike and toe off, R hip circumduction, decreased knee flexion during swing 01/07/23: Increased heel strike with foot drop brace, R hip circumduction still present Goal status: IN PROGRESS  3.  Pt will have improved FOTO score to >/=76 Baseline: 66 01/07/23: 62 Goal status: IN PROGRESS  4.  Pt will demo improved Berg Balance to >/=52/56 to be categorized as a low fall risk Baseline: 47/56 01/07/23: 52/56 Goal status: MET  5.  Pt will have improved DGI to >/=20/24 for low fall risk Baseline:  01/07/23: 19/24 Goal status: IN PROGRESS  6.  Pt will be able to ascend/descend 12 steps with normal reciprocal pattern without hand holds for improved entry/exit of home Baseline:  01/07/23: Reciprocal pattern with hand rails for safety Goal status: IN PROGRESS  ASSESSMENT:  CLINICAL IMPRESSION: Session focused on treadmill training on declines and endurance with maintaining proper gait pattern. Continues to demo some hip ER requiring tactile cueing. Focused on initiating more ankle stability as pt continues to have decreased foot control. Able to perform eccentric step downs without UE assist now. He is demonstrating good increase in strength with his quads and hips.    OBJECTIVE IMPAIRMENTS: Abnormal gait, decreased activity tolerance, decreased balance, decreased coordination, decreased endurance, decreased  mobility, difficulty walking, decreased strength, and improper body mechanics.   PLAN:  PT FREQUENCY: 2x/week  PT DURATION: 8 weeks  PLANNED INTERVENTIONS: 97146- PT Re-evaluation, 97110-Therapeutic exercises, 97530- Therapeutic activity, [id number]- Neuromuscular re-education, 97535- Self Care, 16109- Manual therapy, [phone]- Gait training, [phone]- Orthotic Fit/training, [phone]- Aquatic Therapy, [phone]- Ionotophoresis 4mg /ml Dexamethasone, Patient/Family education, Balance training, Stair training, Taping, Cryotherapy, and Moist heat  PLAN FOR  NEXT SESSION: Assess response to HEP. Lunges, multidirectional stepping/weight shifting, stairs/steps, and eccentric quad control. Gait training with/without drop foot support. Continue ankle strengthening. R LE strengthening, balance, and coordination   [facility] April Ma L Manly, PT, DPT 02/13/2023, 1:29 PM

## 2023-02-13 NOTE — Unmapped (Signed)
The [facility] has made a second and final attempt to reach this patient to refill the following medication:BIKTARVY 50-200-25 mg tablet (bictegrav-emtricit-tenofov ala).      We have been unable to leave messages on the following phone numbers: [phone] .    Dates contacted: 02/05/23 and 02/13/23  Last scheduled delivery: 01/04/23    The patient may be at risk of non-compliance with this medication. The patient should call the [facility] at [phone]  Option 4, then Option 4: Infectious Disease, Transplant to refill medication.    Kerby Less   [facility]

## 2023-02-15 ENCOUNTER — Ambulatory Visit: Payer: 59 | Admitting: Physical Therapy

## 2023-02-15 ENCOUNTER — Ambulatory Visit: Payer: 59 | Admitting: Occupational Therapy

## 2023-02-15 ENCOUNTER — Ambulatory Visit: Admit: 2023-02-15 | Discharge: 2023-02-16 | Payer: MEDICARE

## 2023-02-15 DIAGNOSIS — Z45018 Encounter for adjustment and management of other part of cardiac pacemaker: Principal | ICD-10-CM

## 2023-02-15 DIAGNOSIS — R2681 Unsteadiness on feet: Secondary | ICD-10-CM | POA: Diagnosis not present

## 2023-02-15 DIAGNOSIS — M6281 Muscle weakness (generalized): Secondary | ICD-10-CM

## 2023-02-15 DIAGNOSIS — R278 Other lack of coordination: Secondary | ICD-10-CM

## 2023-02-15 DIAGNOSIS — R2689 Other abnormalities of gait and mobility: Secondary | ICD-10-CM

## 2023-02-15 NOTE — Therapy (Signed)
OUTPATIENT OCCUPATIONAL THERAPY NEURO TREATMENT  Patient Name: James Ferrell MRN: [id number] DOB:1975-03-18, 48 y.o., male Today's Date: 02/15/2023  PCP: Dorris Carnes, MD REFERRING PROVIDER: Dorris Carnes, MD  END OF SESSION:  OT End of Session - 02/15/23 1656     Visit Number 5    Number of Visits 12    Date for OT Re-Evaluation 03/22/23    Authorization Type [employer] Dual complete -covered at 100%    Progress Note Due on Visit 10    OT Start Time 1400    OT Stop Time 1445    OT Time Calculation (min) 45 min    Activity Tolerance Patient tolerated treatment well    Behavior During Therapy [facility] for tasks assessed/performed              Past Medical History:  Diagnosis Date   CHB (complete heart block) (HCC) 2020   Endocarditis of aortic valve    Feb 2020 and April 2021   H/O mechanical aortic valve replacement    HIV (human immunodeficiency virus infection) (HCC)    Stroke [facility])    Past Surgical History:  Procedure Laterality Date   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/21/2019   With root replacement   BENTALL PROCEDURE  07/21/2019   CARDIAC VALVE SURGERY     MECHANICAL AORTIC VALVE REPLACEMENT  07/21/2019   Redo of valve surgery   PACEMAKER IMPLANT     TEE WITHOUT CARDIOVERSION N/A 05/13/2019   Procedure: TRANSESOPHAGEAL ECHOCARDIOGRAM (TEE);  Surgeon: Laurier Nancy, MD;  Location: [facility] ORS;  Service: Cardiovascular;  Laterality: N/A;   TOTAL HIP ARTHROPLASTY Right 08/30/2019   Procedure: TOTAL HIP ARTHROPLASTY ANTERIOR APPROACH;  Surgeon: Tarry Kos, MD;  Location: [facility] OR;  Service: Orthopedics;  Laterality: Right;   Patient Active Problem List   Diagnosis Date Noted   Status post mechanical aortic valve replacement 08/30/2019   Closed displaced fracture of right femoral neck (HCC) 08/30/2019   HIV (human immunodeficiency virus infection) (HCC) 05/20/2019   Bacteremia due to Staphylococcus aureus 05/10/2019   Sepsis (HCC) 05/09/2019   Leukocytosis  05/09/2019   Hypotension 05/09/2019   AKI (acute kidney injury) (HCC) 05/09/2019    ONSET DATE: 01/01/2023  REFERRING DIAG: N82.95 (ICD-10-CM) - Personal history of transient ischemic attack (TIA), and cerebral infarction without residual deficits  THERAPY DIAG:  Other lack of coordination  Muscle weakness  Unsteadiness on feet  Rationale for Evaluation and Treatment: Rehabilitation  SUBJECTIVE:   SUBJECTIVE STATEMENT: Pt reported no significant updates.  Pt reported completing FM coordination HEP though "speed is not the fastest." Pt reported completing theraputty HEP most days.  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    Per chart pt admit 6/4-6/12, went to inpatient rehab 6/12-6/28 for his CVA  PRECAUTIONS: Fall and ICD/Pacemaker, HIV +  WEIGHT BEARING RESTRICTIONS: No  PAIN:  Are you having pain? No    FALLS: Has patient fallen in last 6 months? No   LIVING ENVIRONMENT: Lives with: lives alone Lives in: 2nd floor apartment Stairs: Yes: External: 12 steps; bilateral but cannot  reach both Has following equipment at home: None   PLOF: Independent w/ ADLS, on disability d/t heart condition  PATIENT GOALS: improve coordination Rt hand  OBJECTIVE:  Note: Objective measures were completed at Evaluation unless otherwise noted.  HAND DOMINANCE: Right  ADLs: Eating: independent Grooming: 10% Rt hand, majority w/ Lt hand UB Dressing: independent LB Dressing: some difficulty with buttons and tying shoes, mod I   Toileting: independent Bathing: independent Tub Shower transfers: mod I  Equipment: Grab bars  IADLs: Shopping: mod I (neighbor takes him) Light housekeeping: mod I  Meal Prep: mod I  Community mobility: doesn't have car yet Medication management: independent Financial management: independent Handwriting: 100% legible and Moderate micrographia  MOBILITY STATUS: Independent   FUNCTIONAL OUTCOME MEASURES: FOTO: 61 (RUE)   UPPER EXTREMITY ROM:  BUE AROM WNL's   UPPER EXTREMITY MMT:   RUE grossly 4/5  HAND FUNCTION: Grip strength: Right: 66.1 lbs; Left: 112.2 lbs  COORDINATION: 9 Hole Peg test: Right: 36.35 sec; Left: 22.26 sec  SENSATION: WFL Light touch: WFL  EDEMA: none   COGNITION: Overall cognitive status: Within functional limits for tasks assessed  VISION: Subjective report: denies change Baseline vision: No visual deficits Visual history:  none  PERCEPTION: Not tested  PRAXIS: Not tested    TODAY'S TREATMENT:                                                                                                                              DATE:   TherAct FM task with small pegs: Picking up x2-3 small pegs with in-hand manipulation (finger-to-palm translation) and placing in small pegboard (palm-to-finger translation), copying 3-color pattern - to improve FM coordination and dexterity of affected UE, to improve efficiency with affected UE for FM tasks. Pt required extra time for task and x3 v/c for pattern recognition.  FM task with large pegs, standing: Placing large pegs at upright vertical surface, copying 5-color pattern - to promote forward functional reach of affected UE, to promote scapular stability and AROM of affected UE shoulder. Pt required extra time for task, x2 v/c for pattern recognition, x1 v/c to use affected UE.  Neuro Re-Ed OT initiated affected UE strengthening/stability HEP - to improve scapular stability of affected UE, to increase  affected UE strengthening and gross motor coordination. Pt returned demonstration though required mod to max v/c during tasks for posture, positioning, and gross motor coordination to promote understanding of exercises. Recommended to review exercises at next visit to ensure carryover of HEP correct positioning and posture. Access Code: [facility] URL: [email or web address] Date: 02/15/2023 Prepared by: Carilyn Goodpasture  Exercises - Seated Scapular Retraction  - 2 x daily - 1 sets - 5 reps - 5 hold - Prone Scapular Slide with Shoulder Extension  - 2 x daily - 1 sets - 10 reps - 5 hold - Plank on Knees  - 2 x daily - 3 sets - 30 hold - Kneeling Plank with Scapular Protraction  Retraction AROM  - 2 x daily - 3 sets - 15 hold - Wall Push Up  - 2 x daily - 1 sets - 5 reps  PATIENT EDUCATION: Education details: see above Person educated: Patient Education method: Explanation, Demonstration, Verbal cues, and Handouts Education comprehension: verbalized understanding, returned demonstration, verbal cues required, and needs further education  HOME EXERCISE PROGRAM: 01/07/23: coordination HEP  01/30/23: putty and theraband HEP   02/13/23: A/E recommendations 02/15/23: affected UE strengthening/stability HEP, Access Code: [facility]   GOALS: Goals reviewed with patient? Yes  SHORT TERM GOALS: Target date: 02/22/23 (extended d/t pt unable to return again until January)   Independent with HEP for coordination Rt hand Baseline: Goal status: MET at eval  2.  Independent with putty HEP for Rt hand and theraband HEP for RUE strengthening Baseline:  Goal status: MET   LONG TERM GOALS: Target date: 03/22/23  Improve coordination as evidenced by performing 9 hole peg test in under 30 sec Baseline: 36 sec Goal status: INITIAL  2.  Grip strength Rt hand to increase to 71 lbs or greater Rt hand Baseline: 66 lbs Goal status: INITIAL  3.  Pt to groom 50% with Rt dominant hand Baseline:  10% Goal status: INITIAL  4.  FOTO to increase from 61 to 65 score for RUE Baseline:  Goal status: INITIAL    ASSESSMENT:  CLINICAL IMPRESSION: Patient seen today for occupational therapy treatment for CVA. Hx includes heart issues w/ surgeries, pacemaker, and HIV+. Pt tolerated tasks well. Patient requires review of HEP's for proper positioning and carryover at home. Recommended to review today's HEP at next visit to ensure carryover of HEP correct positioning and posture. Pt would continue to benefit from skilled OT services in the outpatient setting to work on impairments as noted below to help pt return to PLOF as able.     PERFORMANCE DEFICITS: in functional skills including IADLs, coordination, strength, Fine motor control, endurance, and UE functional use.   IMPAIRMENTS: are limiting patient from IADLs.   CO-MORBIDITIES: may have co-morbidities  that affects occupational performance. Patient will benefit from skilled OT to address above impairments and improve overall function.  MODIFICATION OR ASSISTANCE TO COMPLETE EVALUATION: No modification of tasks or assist necessary to complete an evaluation.  OT OCCUPATIONAL PROFILE AND HISTORY: Problem focused assessment: Including review of records relating to presenting problem.  CLINICAL DECISION MAKING: LOW - limited treatment options, no task modification necessary  REHAB POTENTIAL: Good  EVALUATION COMPLEXITY: Low    PLAN:  OT FREQUENCY: 1-2x/week  OT DURATION: 8 weeks  PLANNED INTERVENTIONS: 97535 self care/ADL training, 04540 therapeutic exercise, 97530 therapeutic activity, 97112 neuromuscular re-education, 97140 manual therapy, 97039 fluidotherapy, passive range of motion, energy conservation, patient/family education, and DME and/or AE instructions  RECOMMENDED OTHER SERVICES: none at this time (currently seeing P.T.)  CONSULTED AND AGREED WITH PLAN OF CARE: Patient  PLAN FOR NEXT SESSION:  Review most recent  HEP at next visit to ensure carryover of HEP correct positioning and posture - progress as tolerated (quadruped)  continue functional reaching/coordination (e.g. peg design on vertical surface)   Wynetta Emery, OT 02/15/2023, 5:04 PM

## 2023-02-15 NOTE — Therapy (Signed)
OUTPATIENT PHYSICAL THERAPY TREATMENT    Patient Name: James Ferrell MRN: [id number] DOB:[birth year], 48 y.o., male Today's Date: 02/15/2023   PCP: Lucienne Minks provider REFERRING PROVIDER: Idelia Salm, MD  END OF SESSION:  PT End of Session - 02/15/23 1336     Visit Number 20    Date for PT Re-Evaluation 03/04/23    Authorization Type [employer] Medicare    Progress Note Due on Visit 20    PT Start Time 1320    PT Stop Time 1400    PT Time Calculation (min) 40 min    Activity Tolerance Patient tolerated treatment well    Behavior During Therapy [facility] for tasks assessed/performed                  Past Medical History:  Diagnosis Date   CHB (complete heart block) (HCC) 2020   Endocarditis of aortic valve    Feb 2020 and April 2021   H/O mechanical aortic valve replacement    HIV (human immunodeficiency virus infection) (HCC)    Stroke [facility])    Past Surgical History:  Procedure Laterality Date   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/21/2019   With root replacement   BENTALL PROCEDURE  07/21/2019   CARDIAC VALVE SURGERY     MECHANICAL AORTIC VALVE REPLACEMENT  07/21/2019   Redo of valve surgery   PACEMAKER IMPLANT     TEE WITHOUT CARDIOVERSION N/A 05/13/2019   Procedure: TRANSESOPHAGEAL ECHOCARDIOGRAM (TEE);  Surgeon: Laurier Nancy, MD;  Location: [facility] ORS;  Service: Cardiovascular;  Laterality: N/A;   TOTAL HIP ARTHROPLASTY Right 08/30/2019   Procedure: TOTAL HIP ARTHROPLASTY ANTERIOR APPROACH;  Surgeon: Tarry Kos, MD;  Location: [facility] OR;  Service: Orthopedics;  Laterality: Right;   Patient Active Problem List   Diagnosis Date Noted   Status post mechanical aortic valve replacement 08/30/2019   Closed displaced fracture of right femoral neck (HCC) 08/30/2019   HIV (human immunodeficiency virus infection) (HCC) 05/20/2019   Bacteremia due to Staphylococcus aureus 05/10/2019   Sepsis (HCC) 05/09/2019   Leukocytosis 05/09/2019   Hypotension 05/09/2019   AKI  (acute kidney injury) (HCC) 05/09/2019    ONSET DATE: June 2024  REFERRING DIAG: I63.9 (ICD-10-CM) - CVA (cerebral vascular accident)  THERAPY DIAG:  Other lack of coordination  Muscle weakness  Unsteadiness on feet  Other abnormalities of gait and mobility  Rationale for Evaluation and Treatment: Rehabilitation  SUBJECTIVE:  SUBJECTIVE STATEMENT: Pt reports nothing new or different  From eval: Pt reports he had a CVA this past June. Pt states his recovery has been going well. Pt states R LE is still weak. Pt states home health PT ended last week. Pt states balancing is still hard as well as going up/down the stairs. Wants to be able to walk better. Is interested in returning to work if able.  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   Per chart pt admit 6/4-6/12, went to inpatient rehab 6/12-6/28 for his CVA  PAIN:  Are you having pain? No  PRECAUTIONS: None  RED FLAGS: None   WEIGHT BEARING RESTRICTIONS: No  FALLS: Has patient fallen in last 6 months? No  LIVING ENVIRONMENT: Lives with: lives alone Lives in: House/apartment Stairs: Yes: External: 12 steps; bilateral but cannot reach both Has following equipment at home: None  PLOF: Independent  PATIENT GOALS: Improve mobility for stairs and gait quality  OBJECTIVE:  Note: Objective measures were completed at Evaluation  unless otherwise noted.  DIAGNOSTIC FINDINGS: CTA Head 06/26/22: Partially occlusive thrombus of the left distal M1 segment.   COORDINATION: Decreased on R due to strength deficits  MUSCLE TONE: RLE: +1 with knee and hip extension, multibeat myoclonus x 5 sec with R ankle DF  LOWER EXTREMITY ROM:     Active  Right Eval Left Eval  Hip flexion    Hip extension    Hip abduction    Hip adduction    Hip internal rotation    Hip external rotation    Knee flexion    Knee extension    Ankle dorsiflexion    Ankle plantarflexion    Ankle inversion    Ankle eversion     (Blank rows = not tested)  LOWER EXTREMITY MMT:    MMT Right Eval Left Eval  Hip flexion 5 5  Hip extension 4- 5  Hip abduction 4 4  Hip adduction    Hip internal rotation    Hip external rotation    Knee flexion 3+ 5  Knee extension 4- 5  Ankle dorsiflexion 3+ in seated unable in standing 5  Ankle plantarflexion 4 5  Ankle inversion    Ankle eversion    (Blank rows = not tested)  BED MOBILITY:  Independent  TRANSFERS: Independent  STAIRS: Level of Assistance: Modified independence Stair Negotiation Technique: Step to Pattern with Single Rail on Right Number of Stairs: 4  Height of Stairs: 6"  Comments: R LE circumduction to ascend, wide BOS  GAIT: Gait pattern: Decreased R ankle DF on heel strike (foot slaps) with poor toe off, R hip circumduction during swim with decreased knee extension, decreased R LE stance time, compensatory trunk lean to R during stance phase Distance walked: >200' Assistive device utilized: None Level of assistance: Complete Independence  FUNCTIONAL TESTS:  Eval: 5x STS 12.97 sec with heavy weightshift to L LE Berg Balance Test: 47/56 DGI: 14/24  01/07/23: 5x STS 11.12 sec Berg Balance Test: 52/56 DGI: 19/24       PATIENT SURVEYS:  FOTO 66; predicted 76  VITALS  There were no vitals filed for this  visit.   TODAY'S TREATMENT:       Treadmill x8 min  fwd at 1.2 mph at 3% incline; x5 min fwd on decline at 3% at 1 mph focusing on foot and hip control to decrease foot ER and hip circumduction  Both feet standing on fitter, knees straight ankle DF/PF x20; knees bent ankle DF/PF x10 Both feet standing on fitter, side to side rocking x20 R foot on fitter, ankle DF/PF x20; side to side x20 R SLS 2x30" Forward lunge to airex pad 2x10 Lunge into single leg x10   PATIENT EDUCATION: Education details: Continue HEP, think about bracing options  Person educated: Patient Education method: Explanation and Demonstration Education comprehension: verbalized understanding and needs further education  HOME EXERCISE PROGRAM: Access Code: [email or web address] URL: [email or web address] Date: 01/07/2023 Prepared by: Vernon Prey April Kirstie Peri  Exercises - Standing Gastroc Stretch  - 1 x daily - 7 x weekly - 2 sets - 30 sec hold - Standing Soleus Stretch  - 1 x daily - 7 x weekly - 2 sets - 30 sec hold - Walking Forward Lunge  - 1 x daily - 7 x weekly - 2 sets - 10 reps - Single Leg Stance  - 1 x daily - 7 x weekly - 2 sets - 10 reps - Runner's Step Up/Down  - 1 x daily - 7 x weekly - 3 sets - 10 reps - Forward Step Down with Heel Tap and Counter Support  - 1 x daily - 7 x weekly - 3 sets - 10 reps  GOALS: Goals reviewed with patient? Yes  SHORT TERM GOALS: Target date: 11/30/2022   Pt will be ind with HEP Baseline: Goal status: PARTIALLY MET -- not consistent 11/30/22   2.  Pt will be able to perform ankle DF in standing for heel strike Baseline:  Goal status: MET 11/14/22    LONG TERM GOALS: Target date: 03/04/2023   Pt will be ind with management and progression of HEP Baseline:  Goal status: IN PROGRESS  2.  Pt will be able to demonstrate decreased hip circumduction with improved heel strike with or without R AFO for improved gait stabilty x 1000' for limited community distances Baseline: foot slap/decreased heel strike and toe off,  R hip circumduction, decreased knee flexion during swing 01/07/23: Increased heel strike with foot drop brace, R hip circumduction still present Goal status: IN PROGRESS  3.  Pt will have improved FOTO score to >/=76 Baseline: 66 01/07/23: 62 Goal status: IN PROGRESS  4.  Pt will demo improved Berg Balance to >/=52/56 to be categorized as a low fall risk Baseline: 47/56 01/07/23: 52/56 Goal status: MET  5.  Pt will have improved DGI to >/=20/24 for low fall risk Baseline:  01/07/23: 19/24 Goal status: IN PROGRESS  6.  Pt will be able to ascend/descend 12 steps with normal reciprocal pattern without hand holds for improved entry/exit of home Baseline:  01/07/23: Reciprocal pattern with hand rails for safety Goal status: IN PROGRESS  ASSESSMENT:  CLINICAL IMPRESSION: Continued to work on improving R LE strength and stability. Added more ankle stabilization in standing using fitter. Working on single leg dynamic motion. Continued focus on maintaining even step length with increasing gait speed on treadmill.    OBJECTIVE IMPAIRMENTS: Abnormal gait, decreased activity tolerance, decreased balance, decreased coordination, decreased endurance, decreased mobility, difficulty walking, decreased strength, and improper body mechanics.   PLAN:  PT FREQUENCY: 2x/week  PT DURATION: 8 weeks  PLANNED INTERVENTIONS: 46962-  PT Re-evaluation, 97110-Therapeutic exercises, 97530- Therapeutic activity, [id number]- Neuromuscular re-education, [phone]- Self Care, 60454- Manual therapy, [phone]- Gait training, [phone]- Orthotic Fit/training, [phone]- Aquatic Therapy, [phone]- Ionotophoresis 4mg /ml Dexamethasone, Patient/Family education, Balance training, Stair training, Taping, Cryotherapy, and Moist heat  PLAN FOR NEXT SESSION: Assess response to HEP. Lunges, multidirectional stepping/weight shifting, stairs/steps, and eccentric quad control. Gait training with/without drop foot support. Continue ankle  strengthening. R LE strengthening, balance, and coordination   Glorianna Gott April Ma L [place], PT, DPT 02/15/2023, 1:36 PM

## 2023-02-15 NOTE — Unmapped (Signed)
Spoke with Aldean Ast regarding the disconnected phone app. Unable to get through to the patient on phone number in Epic [phone]) goes straight to message that states your request can not be processed. Suzette Battiest states that she gets the same recording when she tries to call the patient.   Mailed letter regarding the disconnected phone app instead. Not on MyChart.   Also sent text regarding same.

## 2023-02-18 ENCOUNTER — Ambulatory Visit: Payer: 59 | Admitting: Occupational Therapy

## 2023-02-18 ENCOUNTER — Ambulatory Visit: Payer: 59 | Admitting: Physical Therapy

## 2023-02-18 ENCOUNTER — Ambulatory Visit: Admit: 2023-02-18 | Payer: MEDICARE | Attending: Family | Primary: Family

## 2023-02-18 DIAGNOSIS — R41842 Visuospatial deficit: Secondary | ICD-10-CM

## 2023-02-18 DIAGNOSIS — R2681 Unsteadiness on feet: Secondary | ICD-10-CM

## 2023-02-18 DIAGNOSIS — R2689 Other abnormalities of gait and mobility: Secondary | ICD-10-CM

## 2023-02-18 DIAGNOSIS — M6281 Muscle weakness (generalized): Secondary | ICD-10-CM

## 2023-02-18 DIAGNOSIS — R278 Other lack of coordination: Secondary | ICD-10-CM

## 2023-02-18 NOTE — Therapy (Signed)
OUTPATIENT PHYSICAL THERAPY TREATMENT    Patient Name: James Ferrell MRN: [id number] DOB:Sep 30, 1975, 48 y.o., male Today's Date: 02/18/2023   PCP: Lucienne Minks provider REFERRING PROVIDER: Idelia Salm, MD  END OF SESSION:  PT End of Session - 02/18/23 1323     Visit Number 21    Date for PT Re-Evaluation 03/04/23    Authorization Type [employer] Medicare    PT Start Time 1320    PT Stop Time 1400    PT Time Calculation (min) 40 min    Activity Tolerance Patient tolerated treatment well    Behavior During Therapy [facility] for tasks assessed/performed                   Past Medical History:  Diagnosis Date   CHB (complete heart block) (HCC) 2020   Endocarditis of aortic valve    Feb 2020 and April 2021   H/O mechanical aortic valve replacement    HIV (human immunodeficiency virus infection) (HCC)    Stroke [facility])    Past Surgical History:  Procedure Laterality Date   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/21/2019   With root replacement   BENTALL PROCEDURE  07/21/2019   CARDIAC VALVE SURGERY     MECHANICAL AORTIC VALVE REPLACEMENT  07/21/2019   Redo of valve surgery   PACEMAKER IMPLANT     TEE WITHOUT CARDIOVERSION N/A 05/13/2019   Procedure: TRANSESOPHAGEAL ECHOCARDIOGRAM (TEE);  Surgeon: Laurier Nancy, MD;  Location: [facility] ORS;  Service: Cardiovascular;  Laterality: N/A;   TOTAL HIP ARTHROPLASTY Right 08/30/2019   Procedure: TOTAL HIP ARTHROPLASTY ANTERIOR APPROACH;  Surgeon: Tarry Kos, MD;  Location: [facility] OR;  Service: Orthopedics;  Laterality: Right;   Patient Active Problem List   Diagnosis Date Noted   Status post mechanical aortic valve replacement 08/30/2019   Closed displaced fracture of right femoral neck (HCC) 08/30/2019   HIV (human immunodeficiency virus infection) (HCC) 05/20/2019   Bacteremia due to Staphylococcus aureus 05/10/2019   Sepsis (HCC) 05/09/2019   Leukocytosis 05/09/2019   Hypotension 05/09/2019   AKI (acute kidney injury) (HCC)  05/09/2019    ONSET DATE: June 2024  REFERRING DIAG: I63.9 (ICD-10-CM) - CVA (cerebral vascular accident)  THERAPY DIAG:  No diagnosis found.  Rationale for Evaluation and Treatment: Rehabilitation  SUBJECTIVE:                                                                                                                                                                                             SUBJECTIVE STATEMENT: Pt reports he's been doing his ankle exercises.   From eval: Pt reports he had a  CVA this past June. Pt states his recovery has been going well. Pt states R LE is still weak. Pt states home health PT ended last week. Pt states balancing is still hard as well as going up/down the stairs. Wants to be able to walk better. Is interested in returning to work if able.  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   Per chart pt admit 6/4-6/12, went to inpatient rehab 6/12-6/28 for his CVA  PAIN:  Are you having pain? No  PRECAUTIONS: None  RED FLAGS: None   WEIGHT BEARING RESTRICTIONS: No  FALLS: Has patient fallen in last 6 months? No  LIVING ENVIRONMENT: Lives with: lives alone Lives in: House/apartment Stairs: Yes: External: 12 steps; bilateral but cannot reach both Has following equipment at home: None  PLOF: Independent  PATIENT GOALS: Improve mobility for stairs and gait quality  OBJECTIVE:  Note: Objective measures were completed at Evaluation unless otherwise noted.  DIAGNOSTIC FINDINGS: CTA Head 06/26/22: Partially occlusive thrombus of the left distal  M1 segment.   COORDINATION: Decreased on R due to strength deficits  MUSCLE TONE: RLE: +1 with knee and hip extension, multibeat myoclonus x 5 sec with R ankle DF  LOWER EXTREMITY ROM:     Active  Right Eval Left Eval  Hip flexion    Hip extension    Hip abduction    Hip adduction    Hip internal rotation    Hip external rotation    Knee flexion    Knee extension    Ankle dorsiflexion    Ankle plantarflexion    Ankle inversion    Ankle eversion     (Blank rows = not tested)  LOWER EXTREMITY MMT:    MMT Right Eval Left Eval  Hip flexion 5 5  Hip extension 4- 5  Hip abduction 4 4  Hip adduction    Hip internal rotation    Hip external rotation    Knee flexion 3+ 5  Knee extension 4- 5  Ankle dorsiflexion 3+ in seated unable in standing 5  Ankle plantarflexion 4 5  Ankle inversion    Ankle eversion    (Blank rows = not tested)  BED MOBILITY:  Independent  TRANSFERS: Independent  STAIRS: Level of Assistance: Modified independence Stair Negotiation Technique: Step to Pattern with Single Rail on Right Number of Stairs: 4  Height of Stairs: 6"  Comments: R LE circumduction to ascend, wide BOS  GAIT: Gait pattern: Decreased R ankle DF on heel strike (foot slaps) with poor toe off, R hip circumduction during swim with decreased knee extension, decreased R LE stance time, compensatory trunk lean to R during stance phase Distance walked: >200' Assistive device utilized: None Level of assistance: Complete Independence  FUNCTIONAL TESTS:  Eval: 5x STS 12.97 sec with heavy weightshift to L LE Berg Balance Test: 47/56 DGI: 14/24  01/07/23: 5x STS 11.12 sec Berg Balance Test: 52/56 DGI: 19/24       PATIENT SURVEYS:  FOTO 66; predicted 76  VITALS  There were no vitals filed for this visit.   TODAY'S TREATMENT:       Treadmill x5 min fwd at  1.2 mph at 3% incline; x5 min fwd on decline at 2% at 1.3 mph focusing on foot and hip control to decrease  foot ER and hip circumduction  Ambulating around gym x4 laps working on gait speed and equal step length  Sitting ankle inv/ev on towel 3x10 R foot on fitter, ankle DF/PF x20; side to side; CW & CCW x20 Lunge into single leg 2x10 R&L with UE support   PATIENT EDUCATION: Education details: Continue HEP, think about bracing options  Person educated: Patient Education method: Explanation and Demonstration Education comprehension: verbalized understanding and needs further education  HOME EXERCISE PROGRAM: Access Code: [email or web address] URL: [email or web address] Date: 01/07/2023 Prepared by: Vernon Prey April Kirstie Peri  Exercises - Standing Gastroc Stretch  - 1 x daily - 7 x weekly - 2 sets - 30 sec hold - Standing Soleus Stretch  - 1 x daily - 7 x weekly - 2 sets - 30 sec hold - Walking Forward Lunge  - 1 x daily - 7 x weekly - 2 sets - 10 reps - Single Leg Stance  - 1 x daily - 7 x weekly - 2 sets - 10 reps - Runner's Step Up/Down  - 1 x daily - 7 x weekly - 3 sets - 10 reps - Forward Step Down with Heel Tap and Counter Support  - 1 x daily - 7 x weekly - 3 sets - 10 reps  GOALS: Goals reviewed with patient? Yes  SHORT TERM GOALS: Target date: 11/30/2022   Pt will be ind with HEP Baseline: Goal status: PARTIALLY MET -- not consistent 11/30/22   2.  Pt will be able to perform ankle DF in standing for heel strike Baseline:  Goal status: MET 11/14/22    LONG TERM GOALS: Target date: 03/04/2023   Pt will be ind with management and progression of HEP Baseline:  Goal status: IN PROGRESS  2.  Pt will be able to demonstrate decreased hip circumduction with improved heel strike with or without R AFO for improved gait stabilty x 1000' for limited community distances Baseline: foot slap/decreased heel strike and toe off, R hip circumduction, decreased knee flexion during swing 01/07/23: Increased heel strike with foot drop brace, R hip circumduction still present Goal status:  IN PROGRESS  3.  Pt will have improved FOTO score to >/=76 Baseline: 66 01/07/23: 62 Goal status: IN PROGRESS  4.  Pt will demo improved Berg Balance to >/=52/56 to be categorized as a low fall risk Baseline: 47/56 01/07/23: 52/56 Goal status: MET  5.  Pt will have improved DGI to >/=20/24 for low fall risk Baseline:  01/07/23: 19/24 Goal status: IN PROGRESS  6.  Pt will be able to ascend/descend 12 steps with normal reciprocal pattern without hand holds for improved entry/exit of home Baseline:  01/07/23: Reciprocal pattern with hand rails for safety Goal status: IN PROGRESS  ASSESSMENT:  CLINICAL IMPRESSION: Continued to work on treadmill to improve pt's gait speed and cadence. Improving R LE strength. Working on increasing ankle control with some myoclonus most notable with ankle inversion and ankle DF.    OBJECTIVE IMPAIRMENTS: Abnormal gait, decreased activity tolerance, decreased balance, decreased coordination, decreased endurance, decreased mobility, difficulty walking, decreased strength, and improper body mechanics.   PLAN:  PT FREQUENCY: 2x/week  PT DURATION: 8 weeks  PLANNED INTERVENTIONS: 97146- PT Re-evaluation, 97110-Therapeutic exercises, 97530- Therapeutic activity, [id number]- Neuromuscular re-education, 97535- Self Care, 47829- Manual therapy, [phone]- Gait training, [phone]- Orthotic Fit/training, [phone]- Aquatic Therapy, [phone]- Ionotophoresis 4mg /ml  Dexamethasone, Patient/Family education, Balance training, Stair training, Taping, Cryotherapy, and Moist heat  PLAN FOR NEXT SESSION: Assess response to HEP. Lunges, multidirectional stepping/weight shifting, stairs/steps, and eccentric quad control. Gait training with/without drop foot support. Continue ankle strengthening. R LE strengthening, balance, and coordination   Herley Bernardini April Ma L [place], PT, DPT 02/18/2023, 1:34 PM

## 2023-02-18 NOTE — Therapy (Signed)
OUTPATIENT OCCUPATIONAL THERAPY NEURO TREATMENT  Patient Name: James Ferrell MRN: [id number] DOB:[birth year], 48 y.o., male Today's Date: 02/18/2023  PCP: Dorris Carnes, MD REFERRING PROVIDER: Dorris Carnes, MD  END OF SESSION:  OT End of Session - 02/18/23 1402     Visit Number 6    Number of Visits 12    Date for OT Re-Evaluation 03/22/23    Authorization Type UHC Dual complete -covered at 100%    Progress Note Due on Visit 10    OT Start Time 1403    OT Stop Time 1445    OT Time Calculation (min) 42 min    Equipment Utilized During Treatment FM Game    Activity Tolerance Patient tolerated treatment well    Behavior During Therapy [facility] for tasks assessed/performed              Past Medical History:  Diagnosis Date   CHB (complete heart block) (HCC) 2020   Endocarditis of aortic valve    Feb 2020 and April 2021   H/O mechanical aortic valve replacement    HIV (human immunodeficiency virus infection) (HCC)    Stroke [facility])    Past Surgical History:  Procedure Laterality Date   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/21/2019   With root replacement   BENTALL PROCEDURE  07/21/2019   CARDIAC VALVE SURGERY     MECHANICAL AORTIC VALVE REPLACEMENT  07/21/2019   Redo of valve surgery   PACEMAKER IMPLANT     TEE WITHOUT CARDIOVERSION N/A 05/13/2019   Procedure: TRANSESOPHAGEAL ECHOCARDIOGRAM (TEE);  Surgeon: Laurier Nancy, MD;  Location: [facility] ORS;  Service: Cardiovascular;  Laterality: N/A;   TOTAL HIP ARTHROPLASTY Right 08/30/2019   Procedure: TOTAL HIP ARTHROPLASTY ANTERIOR APPROACH;  Surgeon: Tarry Kos, MD;  Location: [facility] OR;  Service: Orthopedics;  Laterality: Right;   Patient Active Problem List   Diagnosis Date Noted   Status post mechanical aortic valve replacement 08/30/2019   Closed displaced fracture of right femoral neck (HCC) 08/30/2019   HIV (human immunodeficiency virus infection) (HCC) 05/20/2019   Bacteremia due to Staphylococcus aureus  05/10/2019   Sepsis (HCC) 05/09/2019   Leukocytosis 05/09/2019   Hypotension 05/09/2019   AKI (acute kidney injury) (HCC) 05/09/2019    ONSET DATE: 01/01/2023  REFERRING DIAG: Y78.29 (ICD-10-CM) - Personal history of transient ischemic attack (TIA), and cerebral infarction without residual deficits  THERAPY DIAG:  Other lack of coordination  Muscle weakness  Visuospatial deficit  Rationale for Evaluation and Treatment: Rehabilitation  SUBJECTIVE:   SUBJECTIVE STATEMENT: Pt reported he has been working on his exercises ie) putty, bands etc.  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    Per chart pt admit 6/4-6/12, went to inpatient rehab 6/12-6/28 for his CVA  PRECAUTIONS: Fall and ICD/Pacemaker, HIV +  WEIGHT BEARING RESTRICTIONS: No  PAIN:  Are you having pain? No    FALLS: Has patient fallen in last 6 months? No   LIVING ENVIRONMENT: Lives with: lives alone Lives in: 2nd floor apartment Stairs: Yes: External: 12 steps; bilateral but cannot reach both Has following equipment at home: None   PLOF: Independent w/ ADLS, on  disability d/t heart condition  PATIENT GOALS: improve coordination Rt hand  OBJECTIVE:  Note: Objective measures were completed at Evaluation unless otherwise noted.  HAND DOMINANCE: Right  ADLs: Eating: independent Grooming: 10% Rt hand, majority w/ Lt hand UB Dressing: independent LB Dressing: some difficulty with buttons and tying shoes, mod I  Toileting: independent Bathing: independent Tub Shower transfers: mod I  Equipment:  Grab bars  IADLs: Shopping: mod I (neighbor takes him) Light housekeeping: mod I  Meal Prep: mod I  Community mobility: doesn't have car yet Medication management: independent Financial management: independent Handwriting: 100% legible and Moderate micrographia  MOBILITY STATUS: Independent   FUNCTIONAL OUTCOME MEASURES: FOTO: 61 (RUE)   UPPER EXTREMITY ROM:  BUE AROM WNL's   UPPER EXTREMITY MMT:   RUE grossly 4/5  HAND FUNCTION: Grip strength: Right: 66.1 lbs; Left: 112.2 lbs  COORDINATION: 9 Hole Peg test: Right: 36.35 sec; Left: 22.26 sec  SENSATION: WFL Light touch: WFL  EDEMA: none   COGNITION: Overall cognitive status: Within functional limits for tasks assessed  VISION: Subjective report: denies change Baseline vision: No visual deficits Visual history:  none  PERCEPTION: Not tested  PRAXIS: Not tested    TODAY'S TREATMENT:                                                                                                                              DATE:   TherAct  Pt participated in Chain game using BUE for eye-hand coordination, pinch strength, visual perceptual skills and cognition for planning and problem solving.  Game required pt to stretch elastics over 3 pegs to create triangles to 'capture' spaces by placing a game piece inside the completed triangle/s. Pt able to problem solve to create triangles multiple time with mod cues to begin and min physical difficulty with coordination of R UE to stretch elastics and place game pieces.  Slight issue with sensory awareness of lightweight game pieces. Pt was able to perform in hand manipulation to manage game pieces with extra time and dropping objects several times.  He was able to follow directions with visual and verbal to introduce new game/rules. Pt able to play game during social conversation with min difficulty.   FM task with small pegs: Picking up x2-3 small pegs with in-hand manipulation  (finger-to-palm translation) and placing in small pegboard (palm-to-finger translation). Pt required extra time and 5+ cues for recognition of strategy for playing game.  Pt also given several v/cs to use affected UE but he then self corrected to using R UE on his own.  Pt does not have many 'hobbies' but did report he used to play cards.  He needs to go to the store to get some more and simple card games introduced to him to work on [employer], Flipping and dealing cards 1 at a time. Ie) for Spot it game and/or Solitaire etc.    PATIENT EDUCATION: Education details: FM activities - games etc for  motor skills Person educated: Patient Education method: Explanation, Demonstration, and Verbal cues Education comprehension: verbalized understanding, returned demonstration, verbal cues required, and needs further education  HOME EXERCISE PROGRAM: 01/07/23: coordination HEP  01/30/23: putty and theraband HEP   02/13/23: A/E recommendations 02/15/23: affected UE strengthening/stability HEP, Access Code: [facility]   GOALS: Goals reviewed with patient? Yes  SHORT TERM GOALS: Target date: 02/22/23 (extended d/t pt unable to return again until January)   Independent with HEP for coordination Rt hand Baseline: Goal status: MET at eval  2.  Independent with putty HEP for Rt hand and theraband HEP for RUE strengthening Baseline:  Goal status: MET   LONG TERM GOALS: Target date: 03/22/23  Improve coordination as evidenced by performing 9 hole peg test in under 30 sec Baseline: 36 sec Goal status: IN Progress  2.  Grip strength Rt hand to increase to 71 lbs or greater Rt hand Baseline: 66 lbs Goal status: IN Progress  3.  Pt to groom 50% with Rt dominant hand Baseline: 10% Goal status: IN Progress  4.  FOTO to increase from 61 to 65 score for RUE Baseline:  Goal status: IN Progress    ASSESSMENT:  CLINICAL IMPRESSION: Patient seen today for occupational therapy treatment for CVA. Hx  includes heart issues w/ surgeries, pacemaker, and HIV+. 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  Patient encouraged to work on some modified constraint induced use of RUE to do various tasks without LUE assistance ie) hanging hangers in the closet etc   Patient requires review of HEP's for proper positioning and carryover at home. Pt would continue to benefit from skilled OT services in the outpatient setting to work on impairments as noted below to help pt return to PLOF as able.     PERFORMANCE DEFICITS: in functional skills including IADLs, coordination, strength, Fine motor control, endurance, and UE functional use.   IMPAIRMENTS: are limiting patient from IADLs.   CO-MORBIDITIES: may have co-morbidities  that affects occupational performance. Patient will benefit from skilled OT to address above impairments and improve overall function.  MODIFICATION OR ASSISTANCE TO COMPLETE EVALUATION: No modification of tasks or assist necessary to complete an evaluation.  OT OCCUPATIONAL PROFILE AND HISTORY: Problem focused assessment: Including review of records relating to presenting problem.  CLINICAL DECISION MAKING: LOW - limited treatment options, no task modification necessary  REHAB POTENTIAL: Good  EVALUATION COMPLEXITY: Low    PLAN:  OT FREQUENCY: 1-2x/week  OT DURATION: 8 weeks  PLANNED INTERVENTIONS: 97535 self care/ADL training, 16109 therapeutic exercise, 97530 therapeutic activity, 97112 neuromuscular re-education, 97140 manual therapy, 97039 fluidotherapy, passive range of motion, energy conservation, patient/family education, and DME and/or AE instructions  RECOMMENDED OTHER SERVICES: none at this time (currently seeing P.T.)  CONSULTED AND AGREED WITH PLAN OF CARE: Patient  PLAN FOR NEXT SESSION:  Review most recent HEP from 02/15/23 visit to ensure carryover of HEP correct positioning and  posture - progress as tolerated (quadruped)  continue functional reaching/coordination (e.g. peg design on vertical surface)   Victorino Sparrow, OT 02/18/2023, 5:42 PM

## 2023-02-20 ENCOUNTER — Ambulatory Visit: Payer: 59 | Admitting: Occupational Therapy

## 2023-02-20 ENCOUNTER — Ambulatory Visit: Payer: 59 | Admitting: Physical Therapy

## 2023-02-20 ENCOUNTER — Encounter: Payer: Self-pay | Admitting: Occupational Therapy

## 2023-02-20 DIAGNOSIS — R2689 Other abnormalities of gait and mobility: Secondary | ICD-10-CM

## 2023-02-20 DIAGNOSIS — M6281 Muscle weakness (generalized): Secondary | ICD-10-CM

## 2023-02-20 DIAGNOSIS — R2681 Unsteadiness on feet: Secondary | ICD-10-CM

## 2023-02-20 DIAGNOSIS — R278 Other lack of coordination: Secondary | ICD-10-CM

## 2023-02-20 NOTE — Therapy (Signed)
OUTPATIENT PHYSICAL THERAPY TREATMENT    Patient Name: James Ferrell MRN: [id number] DOB:1975-08-23, 48 y.o., male Today's Date: 02/20/2023   PCP: Lucienne Minks provider REFERRING PROVIDER: Idelia Salm, MD  END OF SESSION:  PT End of Session - 02/20/23 1326     Visit Number 22    Date for PT Re-Evaluation 03/04/23    Authorization Type [employer] Medicare    PT Start Time 1317    PT Stop Time 1355    PT Time Calculation (min) 38 min    Activity Tolerance Patient tolerated treatment well    Behavior During Therapy [facility] for tasks assessed/performed                    Past Medical History:  Diagnosis Date   CHB (complete heart block) (HCC) 2020   Endocarditis of aortic valve    Feb 2020 and April 2021   H/O mechanical aortic valve replacement    HIV (human immunodeficiency virus infection) (HCC)    Stroke [facility])    Past Surgical History:  Procedure Laterality Date   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/21/2019   With root replacement   BENTALL PROCEDURE  07/21/2019   CARDIAC VALVE SURGERY     MECHANICAL AORTIC VALVE REPLACEMENT  07/21/2019   Redo of valve surgery   PACEMAKER IMPLANT     TEE WITHOUT CARDIOVERSION N/A 05/13/2019   Procedure: TRANSESOPHAGEAL ECHOCARDIOGRAM (TEE);  Surgeon: Laurier Nancy, MD;  Location: [facility] ORS;  Service: Cardiovascular;  Laterality: N/A;   TOTAL HIP ARTHROPLASTY Right 08/30/2019   Procedure: TOTAL HIP ARTHROPLASTY ANTERIOR APPROACH;  Surgeon: Tarry Kos, MD;  Location: [facility] OR;  Service: Orthopedics;  Laterality: Right;   Patient Active Problem List   Diagnosis Date Noted   Status post mechanical aortic valve replacement 08/30/2019   Closed displaced fracture of right femoral neck (HCC) 08/30/2019   HIV (human immunodeficiency virus infection) (HCC) 05/20/2019   Bacteremia due to Staphylococcus aureus 05/10/2019   Sepsis (HCC) 05/09/2019   Leukocytosis 05/09/2019   Hypotension 05/09/2019   AKI (acute kidney injury) (HCC)  05/09/2019    ONSET DATE: June 2024  REFERRING DIAG: I63.9 (ICD-10-CM) - CVA (cerebral vascular accident)  THERAPY DIAG:  Other lack of coordination  Muscle weakness  Unsteadiness on feet  Other abnormalities of gait and mobility  Muscle weakness (generalized)  Rationale for Evaluation and Treatment: Rehabilitation  SUBJECTIVE:  SUBJECTIVE STATEMENT: Pt states he was sore for ~1 day after last session. Feeling better today.  From eval: Pt reports he had a CVA this past June. Pt states his recovery has been going well. Pt states R LE is still weak. Pt states home health PT ended last week. Pt states balancing is still hard as well as going up/down the stairs. Wants to be able to walk better. Is interested in returning to work if able.  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   Per chart pt admit 6/4-6/12, went to inpatient rehab 6/12-6/28 for his CVA  PAIN:  Are you having pain? No  PRECAUTIONS: None  RED FLAGS: None   WEIGHT BEARING RESTRICTIONS: No  FALLS: Has patient fallen in last 6 months? No  LIVING ENVIRONMENT: Lives with: lives alone Lives in: House/apartment Stairs: Yes: External: 12 steps; bilateral but cannot reach both Has following equipment at home: None  PLOF: Independent  PATIENT GOALS: Improve mobility for stairs and gait quality  OBJECTIVE:  Note:  Objective measures were completed at Evaluation unless otherwise noted.  DIAGNOSTIC FINDINGS: CTA Head 06/26/22: Partially occlusive thrombus of the left distal M1 segment.   COORDINATION: Decreased on R due to strength deficits  MUSCLE TONE: RLE: +1 with knee and hip extension, multibeat myoclonus x 5 sec with R ankle DF  LOWER EXTREMITY ROM:     Active  Right Eval Left Eval  Hip flexion    Hip extension    Hip abduction    Hip adduction    Hip internal rotation    Hip external rotation    Knee flexion    Knee extension    Ankle dorsiflexion    Ankle plantarflexion    Ankle inversion    Ankle eversion     (Blank rows = not tested)  LOWER EXTREMITY MMT:    MMT Right Eval Left Eval  Hip flexion 5 5  Hip extension 4- 5  Hip abduction 4 4  Hip adduction    Hip internal rotation    Hip external rotation    Knee flexion 3+ 5  Knee extension 4- 5  Ankle dorsiflexion 3+ in seated unable in standing 5  Ankle plantarflexion 4 5  Ankle inversion    Ankle eversion    (Blank rows = not tested)  BED MOBILITY:  Independent  TRANSFERS: Independent  STAIRS: Level of Assistance: Modified independence Stair Negotiation Technique: Step to Pattern with Single Rail on Right Number of Stairs: 4  Height of Stairs: 6"  Comments: R LE circumduction to ascend, wide BOS  GAIT: Gait pattern: Decreased R ankle DF on heel strike (foot slaps) with poor toe off, R hip circumduction during swim with decreased knee extension, decreased R LE stance time, compensatory trunk lean to R during stance phase Distance walked: >200' Assistive device utilized: None Level of assistance: Complete Independence  FUNCTIONAL TESTS:  Eval: 5x STS 12.97 sec with heavy weightshift to L LE Berg Balance Test: 47/56 DGI: 14/24  01/07/23: 5x STS 11.12 sec Berg Balance Test: 52/56 DGI: 19/24       PATIENT SURVEYS:  FOTO 66; predicted 76  VITALS  There were no vitals filed for this  visit.   TODAY'S TREATMENT:       Treadmill x5 min fwd at 1.2 mph at 5% incline; x5 min fwd on decline at 5% at 1.2 mph focusing on foot and hip control to decrease foot ER and hip circumduction  Ambulating around gym x8 laps working on gait speed and equal step length to 70 bpm metronome Sitting ankle inv/ev on towel x10 Sitting ankle DF with inv/ev red TB 2x10 R foot on fitter, ankle DF/PF x20; side to side; CW & CCW x20    PATIENT EDUCATION: Education details: Continue HEP, think about bracing options  Person educated: Patient Education method: Explanation and Demonstration Education comprehension: verbalized understanding and needs further education  HOME EXERCISE PROGRAM: Access Code: [email or web address] URL: [email or web address] Date: 01/07/2023 Prepared by: Vernon Prey April Kirstie Peri  Exercises - Standing Gastroc Stretch  - 1 x daily - 7 x weekly - 2 sets - 30 sec hold - Standing Soleus Stretch  - 1 x daily - 7 x weekly - 2 sets - 30 sec hold - Walking Forward Lunge  - 1 x daily - 7 x weekly - 2 sets - 10 reps - Single Leg Stance  - 1 x daily - 7 x weekly - 2 sets - 10 reps - Runner's Step Up/Down  - 1 x daily - 7 x weekly - 3 sets - 10 reps - Forward Step Down with Heel Tap and Counter Support  - 1 x daily - 7 x weekly - 3 sets - 10 reps  GOALS: Goals reviewed with patient? Yes  SHORT TERM GOALS: Target date: 11/30/2022   Pt will be ind with HEP Baseline: Goal status: PARTIALLY MET -- not consistent 11/30/22   2.  Pt will be able to perform ankle DF in standing for heel strike Baseline:  Goal status: MET 11/14/22    LONG TERM GOALS: Target date: 03/04/2023   Pt will be ind with management and progression of HEP Baseline:  Goal status: IN PROGRESS  2.  Pt will be able to demonstrate decreased hip circumduction with improved heel strike with or without R AFO for improved gait stabilty x 1000' for limited community distances Baseline: foot slap/decreased  heel strike and toe off, R hip circumduction, decreased knee flexion during swing 01/07/23: Increased heel strike with foot drop brace, R hip circumduction still present Goal status: IN PROGRESS  3.  Pt will have improved FOTO score to >/=76 Baseline: 66 01/07/23: 62 Goal status: IN PROGRESS  4.  Pt will demo improved Berg Balance to >/=52/56 to be categorized as a low fall risk Baseline: 47/56 01/07/23: 52/56 Goal status: MET  5.  Pt will have improved DGI to >/=20/24 for low fall risk Baseline:  01/07/23: 19/24 Goal status: IN PROGRESS  6.  Pt will be able to ascend/descend 12 steps with normal reciprocal pattern without hand holds for improved entry/exit of home Baseline:  01/07/23: Reciprocal pattern with hand rails for safety Goal status: IN PROGRESS  ASSESSMENT:  CLINICAL IMPRESSION: Pt able to tolerate increased elevation on treadmill today. Continued work on improving equal step length to metronome beat for coordination. Progressed pt's ankle strengthening today with red theraband.    OBJECTIVE IMPAIRMENTS: Abnormal gait, decreased activity tolerance, decreased balance, decreased coordination, decreased endurance, decreased mobility, difficulty walking, decreased strength, and improper body mechanics.   PLAN:  PT FREQUENCY: 2x/week  PT DURATION: 8 weeks  PLANNED INTERVENTIONS: 97146- PT Re-evaluation, 97110-Therapeutic exercises, 97530-  Therapeutic activity, [id number]- Neuromuscular re-education, [id number]- Self Care, 60454- Manual therapy, [id number]- Gait training, [phone]- Orthotic Fit/training, [id number]- Aquatic Therapy, [phone]- Ionotophoresis 4mg /ml Dexamethasone, Patient/Family education, Balance training, Stair training, Taping, Cryotherapy, and Moist heat  PLAN FOR NEXT SESSION: Assess response to HEP. Lunges, multidirectional stepping/weight shifting, stairs/steps, and eccentric quad control. Ankle strengthening/control. Continue ankle strengthening. R LE strengthening, balance,  and coordination. Blaze pods to a beat for coordination.    Veora Fonte April Ma L Uriel Dowding, PT, DPT 02/20/2023, 1:28 PM

## 2023-02-20 NOTE — Therapy (Signed)
OUTPATIENT OCCUPATIONAL THERAPY NEURO TREATMENT  Patient Name: James Ferrell MRN: [id number] DOB:February 01, 1975, 48 y.o., male Today's Date: 02/20/2023  PCP: Dorris Carnes, MD REFERRING PROVIDER: Dorris Carnes, MD  END OF SESSION:  OT End of Session - 02/20/23 1409     Visit Number 7    Number of Visits 12    Date for OT Re-Evaluation 03/22/23    Authorization Type UHC Dual complete -covered at 100%    Progress Note Due on Visit 10    OT Start Time 1405    OT Stop Time 1450    OT Time Calculation (min) 45 min    Equipment Utilized During Treatment FM Game    Activity Tolerance Patient tolerated treatment well    Behavior During Therapy [facility] for tasks assessed/performed              Past Medical History:  Diagnosis Date   CHB (complete heart block) (HCC) 2020   Endocarditis of aortic valve    Feb 2020 and April 2021   H/O mechanical aortic valve replacement    HIV (human immunodeficiency virus infection) (HCC)    Stroke [facility])    Past Surgical History:  Procedure Laterality Date   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/21/2019   With root replacement   BENTALL PROCEDURE  07/21/2019   CARDIAC VALVE SURGERY     MECHANICAL AORTIC VALVE REPLACEMENT  07/21/2019   Redo of valve surgery   PACEMAKER IMPLANT     TEE WITHOUT CARDIOVERSION N/A 05/13/2019   Procedure: TRANSESOPHAGEAL ECHOCARDIOGRAM (TEE);  Surgeon: Laurier Nancy, MD;  Location: [facility] ORS;  Service: Cardiovascular;  Laterality: N/A;   TOTAL HIP ARTHROPLASTY Right 08/30/2019   Procedure: TOTAL HIP ARTHROPLASTY ANTERIOR APPROACH;  Surgeon: Tarry Kos, MD;  Location: [facility] OR;  Service: Orthopedics;  Laterality: Right;   Patient Active Problem List   Diagnosis Date Noted   Status post mechanical aortic valve replacement 08/30/2019   Closed displaced fracture of right femoral neck (HCC) 08/30/2019   HIV (human immunodeficiency virus infection) (HCC) 05/20/2019   Bacteremia due to Staphylococcus aureus  05/10/2019   Sepsis (HCC) 05/09/2019   Leukocytosis 05/09/2019   Hypotension 05/09/2019   AKI (acute kidney injury) (HCC) 05/09/2019    ONSET DATE: 01/01/2023  REFERRING DIAG: W09.81 (ICD-10-CM) - Personal history of transient ischemic attack (TIA), and cerebral infarction without residual deficits  THERAPY DIAG:  Other lack of coordination  Muscle weakness  Muscle weakness (generalized)  Rationale for Evaluation and Treatment: Rehabilitation  SUBJECTIVE:   SUBJECTIVE STATEMENT: No pain and no falls  Pt accompanied by: self  PERTINENT HISTORY: HIV, 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    Per chart pt admit 6/4-6/12, went to inpatient rehab 6/12-6/28 for his CVA  PRECAUTIONS: Fall and ICD/Pacemaker, HIV +  WEIGHT BEARING RESTRICTIONS: No  PAIN:  Are you having pain? No    FALLS: Has patient fallen in last 6 months? No   LIVING ENVIRONMENT: Lives with: lives alone Lives in: 2nd floor apartment Stairs: Yes: External: 12 steps; bilateral but cannot reach both Has following equipment at home: None   PLOF: Independent w/ ADLS, on disability d/t heart condition  PATIENT GOALS:  improve coordination Rt hand  OBJECTIVE:  Note: Objective measures were completed at Evaluation unless otherwise noted.  HAND DOMINANCE: Right  ADLs: Eating: independent Grooming: 10% Rt hand, majority w/ Lt hand UB Dressing: independent LB Dressing: some difficulty with buttons and tying shoes, mod I  Toileting: independent Bathing: independent Tub Shower transfers: mod I  Equipment: Grab bars  IADLs: Shopping: mod I  (neighbor takes him) Light housekeeping: mod I  Meal Prep: mod I  Community mobility: doesn't have car yet Medication management: independent Financial management: independent Handwriting: 100% legible and Moderate micrographia  MOBILITY STATUS: Independent   FUNCTIONAL OUTCOME MEASURES: FOTO: 61 (RUE)   UPPER EXTREMITY ROM:  BUE AROM WNL's   UPPER EXTREMITY MMT:   RUE grossly 4/5  HAND FUNCTION: Grip strength: Right: 66.1 lbs; Left: 112.2 lbs  COORDINATION: 9 Hole Peg test: Right: 36.35 sec; Left: 22.26 sec  SENSATION: WFL Light touch: WFL  EDEMA: none   COGNITION: Overall cognitive status: Within functional limits for tasks assessed  VISION: Subjective report: denies change Baseline vision: No visual deficits Visual history:  none  PERCEPTION: Not tested  PRAXIS: Not tested    TODAY'S TREATMENT:                                                                                                                              DATE:   TherAct  Pt participated in Chain game using RUE for eye-hand coordination, pinch strength, visual perceptual skills and cognition for planning and problem solving.  Game required pt to stretch elastics over 4 pegs to create triangles to 'capture' spaces by placing a game piece inside the completed triangle/s. Pt able to problem solve to create triangles multiple time with mod cues to begin and min physical difficulty with coordination of R UE to stretch elastics and place game pieces.  Slight issue with sensory awareness of lightweight game pieces. Pt was able to perform in hand manipulation to manage game pieces with extra time and dropping objects several times.  He was able to follow directions with visual and verbal to introduce new game/rules. Pt able to play game during social conversation with min difficulty.   Neuro re-education:   Reviewed HEP issued 02/15/23 for scapula and sh. Girdle strengthening w/ min to mod cueing for  correct positioning/techniques for normal movement patterns, proximal stability, and gross motor coordination  PATIENT EDUCATION: Education details: see above Person educated: Patient Education method: Explanation, Demonstration, and Verbal cues Education comprehension: verbalized understanding, returned demonstration, verbal cues required, and needs further education  HOME EXERCISE PROGRAM: 01/07/23: coordination HEP  01/30/23: putty and theraband HEP   02/13/23: A/E recommendations 02/15/23: affected UE strengthening/stability HEP, Access Code: [facility]   GOALS: Goals reviewed with patient? Yes  SHORT TERM GOALS: Target date: 02/22/23 (extended d/t pt unable to return again until January)   Independent with HEP for coordination Rt hand Baseline: Goal status: MET at eval  2.  Independent with  putty HEP for Rt hand and theraband HEP for RUE strengthening Baseline:  Goal status: MET   LONG TERM GOALS: Target date: 03/22/23  Improve coordination as evidenced by performing 9 hole peg test in under 30 sec Baseline: 36 sec Goal status: IN Progress  2.  Grip strength Rt hand to increase to 71 lbs or greater Rt hand Baseline: 66 lbs Goal status: IN Progress  3.  Pt to groom 50% with Rt dominant hand Baseline: 10% Goal status: IN Progress  4.  FOTO to increase from 61 to 65 score for RUE Baseline:  Goal status: IN Progress    ASSESSMENT:  CLINICAL IMPRESSION: Patient seen today for occupational therapy treatment for CVA. Hx includes heart issues w/ surgeries, pacemaker, and HIV+. 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  Patient encouraged to work on some modified constraint induced use of RUE to do various tasks without LUE assistance ie) hanging hangers in the closet etc   Patient requires review of HEP's for proper positioning and carryover at home. Pt would continue to benefit from skilled OT  services in the outpatient setting to work on impairments as noted below to help pt return to PLOF as able.     PERFORMANCE DEFICITS: in functional skills including IADLs, coordination, strength, Fine motor control, endurance, and UE functional use.   IMPAIRMENTS: are limiting patient from IADLs.   CO-MORBIDITIES: may have co-morbidities  that affects occupational performance. Patient will benefit from skilled OT to address above impairments and improve overall function.  MODIFICATION OR ASSISTANCE TO COMPLETE EVALUATION: No modification of tasks or assist necessary to complete an evaluation.  OT OCCUPATIONAL PROFILE AND HISTORY: Problem focused assessment: Including review of records relating to presenting problem.  CLINICAL DECISION MAKING: LOW - limited treatment options, no task modification necessary  REHAB POTENTIAL: Good  EVALUATION COMPLEXITY: Low    PLAN:  OT FREQUENCY: 1-2x/week  OT DURATION: 8 weeks  PLANNED INTERVENTIONS: 97535 self care/ADL training, 40981 therapeutic exercise, 97530 therapeutic activity, 97112 neuromuscular re-education, 97140 manual therapy, 97039 fluidotherapy, passive range of motion, energy conservation, patient/family education, and DME and/or AE instructions  RECOMMENDED OTHER SERVICES: none at this time (currently seeing P.T.)  CONSULTED AND AGREED WITH PLAN OF CARE: Patient  PLAN FOR NEXT SESSION:  Progress towards LTG's, continue coordination, RUE functional use/forced use, UBE   Sheran Lawless, OT 02/20/2023, 3:11 PM

## 2023-02-25 NOTE — Unmapped (Signed)
The [facility] has made a third and final attempt to reach this patient to refill the following medication:Biktarvy.      We have been unable to leave messages on the following phone numbers: [phone] .    Dates contacted: 02/05/23, 02/13/23, 02/25/23  Last scheduled delivery: 01/04/23 (30 day supply)    The patient may be at risk of non-compliance with this medication. The patient should call the [facility] at [phone]  Option 4, then Option 4: Infectious Disease, Transplant to refill medication.    Darryl Nestle, PharmD   [facility] Specialty Pharmacist

## 2023-02-26 NOTE — Unmapped (Signed)
Text sent regarding disconnected phone monitoring app.   Patient needs to contact [employer] to get it fixed.

## 2023-02-27 ENCOUNTER — Encounter: Payer: Self-pay | Admitting: Occupational Therapy

## 2023-02-27 ENCOUNTER — Ambulatory Visit: Payer: 59 | Attending: Internal Medicine | Admitting: Physical Therapy

## 2023-02-27 ENCOUNTER — Ambulatory Visit: Payer: 59 | Admitting: Occupational Therapy

## 2023-02-27 DIAGNOSIS — R278 Other lack of coordination: Secondary | ICD-10-CM

## 2023-02-27 DIAGNOSIS — R2681 Unsteadiness on feet: Secondary | ICD-10-CM | POA: Insufficient documentation

## 2023-02-27 DIAGNOSIS — M6281 Muscle weakness (generalized): Secondary | ICD-10-CM | POA: Diagnosis present

## 2023-02-27 DIAGNOSIS — R2689 Other abnormalities of gait and mobility: Secondary | ICD-10-CM | POA: Insufficient documentation

## 2023-02-27 NOTE — Patient Instructions (Addendum)
 Basic activities  Use your affected hand to perform the following activities.  Stop activity if you experience pain.  Wipe tabletop/dusting  Bring plastic cup to mouth, then return to tabletop and release  Flip playing cards  Pick up cotton balls and place in a container  Toss ball  Rotate ball in your hand  Slide checkers on tabletop  Turn doorknob  Open/close cabinet door with handle  Pick up coins and place in a container  Fold towels  Stack blocks  Pick up 1 inch blocks  Pick up a pencil   - Practice writing  - make a sandwich  - eating  - putting away dishes/groceries

## 2023-02-27 NOTE — Therapy (Signed)
 OUTPATIENT PHYSICAL THERAPY TREATMENT    Patient Name: James Ferrell MRN: [id number] DOB:December 23, 1975, 48 y.o., male Today's Date: 02/27/2023   PCP: UNK provider REFERRING PROVIDER: Jimmye Emmie FALCON, MD  END OF SESSION:  PT End of Session - 02/27/23 1332     Visit Number 23    Date for PT Re-Evaluation 03/04/23    Authorization Type [employer] Medicare    PT Start Time 1325   PT late to start visit   PT Stop Time 1400    PT Time Calculation (min) 35 min    Activity Tolerance Patient tolerated treatment well    Behavior During Therapy [facility] for tasks assessed/performed              Past Medical History:  Diagnosis Date   CHB (complete heart block) (HCC) 2020   Endocarditis of aortic valve    Feb 2020 and April 2021   H/O mechanical aortic valve replacement    HIV (human immunodeficiency virus infection) (HCC)    Stroke [facility])    Past Surgical History:  Procedure Laterality Date   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/21/2019   With root replacement   BENTALL PROCEDURE  07/21/2019   CARDIAC VALVE SURGERY     MECHANICAL AORTIC VALVE REPLACEMENT  07/21/2019   Redo of valve surgery   PACEMAKER IMPLANT     TEE WITHOUT CARDIOVERSION N/A 05/13/2019   Procedure: TRANSESOPHAGEAL ECHOCARDIOGRAM (TEE);  Surgeon: Fernand Denyse LABOR, MD;  Location: [facility] ORS;  Service: Cardiovascular;  Laterality: N/A;   TOTAL HIP ARTHROPLASTY Right 08/30/2019   Procedure: TOTAL HIP ARTHROPLASTY ANTERIOR APPROACH;  Surgeon: Jerri Kay HERO, MD;  Location: [facility] OR;  Service: Orthopedics;  Laterality: Right;   Patient Active Problem List   Diagnosis Date Noted   Status post mechanical aortic valve replacement 08/30/2019   Closed displaced fracture of right femoral neck (HCC) 08/30/2019   HIV (human immunodeficiency virus infection) (HCC) 05/20/2019   Bacteremia due to Staphylococcus aureus 05/10/2019   Sepsis (HCC) 05/09/2019   Leukocytosis 05/09/2019   Hypotension 05/09/2019   AKI (acute kidney injury)  (HCC) 05/09/2019    ONSET DATE: June 2024  REFERRING DIAG: I63.9 (ICD-10-CM) - CVA (cerebral vascular accident)  THERAPY DIAG:  Other lack of coordination  Muscle weakness  Muscle weakness (generalized)  Unsteadiness on feet  Other abnormalities of gait and mobility  Rationale for Evaluation and Treatment: Rehabilitation  SUBJECTIVE:  SUBJECTIVE STATEMENT: Pt states he has been doing his exercises. Has been walking to music.   From eval: Pt reports he had a CVA this past June. Pt states his recovery has been going well. Pt states R LE is still weak. Pt states home health PT ended last week. Pt states balancing is still hard as well as going up/down the stairs. Wants to be able to walk better. Is interested in returning to work if able.  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   Per chart pt admit 6/4-6/12, went to inpatient rehab 6/12-6/28 for his CVA  PAIN:  Are you having pain? No  PRECAUTIONS: None  RED FLAGS: None   WEIGHT BEARING RESTRICTIONS: No  FALLS: Has patient fallen in last 6 months? No  LIVING ENVIRONMENT: Lives with: lives alone Lives in: House/apartment Stairs: Yes: External: 12 steps; bilateral but cannot reach both Has following equipment at home: None  PLOF: Independent  PATIENT GOALS: Improve mobility for stairs and gait quality  OBJECTIVE:  Note:  Objective measures were completed at Evaluation unless otherwise noted.  DIAGNOSTIC FINDINGS: CTA Head 06/26/22: Partially occlusive thrombus of the left distal M1 segment.   COORDINATION: Decreased on R due to strength deficits  MUSCLE TONE: RLE: +1 with knee and hip extension, multibeat myoclonus x 5 sec with R ankle DF  LOWER EXTREMITY ROM:     Active  Right Eval Left Eval  Hip flexion    Hip extension    Hip abduction    Hip adduction    Hip internal rotation    Hip external rotation    Knee flexion    Knee extension    Ankle dorsiflexion    Ankle plantarflexion    Ankle inversion    Ankle eversion     (Blank rows = not tested)  LOWER EXTREMITY MMT:    MMT Right Eval Left Eval  Hip flexion 5 5  Hip extension 4- 5  Hip abduction 4 4  Hip adduction    Hip internal rotation    Hip external rotation    Knee flexion 3+ 5  Knee extension 4- 5  Ankle dorsiflexion 3+ in seated unable in standing 5  Ankle plantarflexion 4 5  Ankle inversion    Ankle eversion    (Blank rows = not tested)  BED MOBILITY:  Independent  TRANSFERS: Independent  STAIRS: Level of Assistance: Modified independence Stair Negotiation Technique: Step to Pattern with Single Rail on Right Number of Stairs: 4  Height of Stairs: 6  Comments: R LE circumduction to ascend, wide BOS  GAIT: Gait pattern: Decreased R ankle DF on heel strike (foot slaps) with poor toe off, R hip circumduction during swim with decreased knee extension, decreased R LE stance time, compensatory trunk lean to R during stance phase Distance walked: >200' Assistive device utilized: None Level of assistance: Complete Independence  FUNCTIONAL TESTS:  Eval: 5x STS 12.97 sec with heavy weightshift to L LE Berg Balance Test: 47/56 DGI: 14/24  01/07/23: 5x STS 11.12 sec Berg Balance Test: 52/56 DGI: 19/24       PATIENT SURVEYS:  FOTO 66; predicted 76  VITALS  There were no vitals filed for this  visit.   TODAY'S TREATMENT:       Treadmill x5 min fwd at 1.5 mph at 5% incline with two hand holds; x5 min 0% incline one hand hold 1.5 mph focusing on foot and hip control to decrease foot ER and hip circumduction  Blaze pods -- working on tapping to 70 or 80 bpm for coordination intermittent UE 3x1 min first with L LE only, then R LE only and then alternating L&R   PATIENT EDUCATION: Education details: Continue HEP, think about bracing options  Person educated: Patient Education method: Explanation and Demonstration Education comprehension: verbalized understanding and needs further education  HOME EXERCISE PROGRAM: Access Code: [email or web address] URL: [email or web address] Date: 01/07/2023 Prepared by: Almina Schul April Earnie Starring  Exercises - Standing Gastroc Stretch  - 1 x daily - 7 x weekly - 2 sets - 30 sec hold - Standing Soleus Stretch  - 1 x daily - 7 x weekly - 2 sets - 30 sec hold - Walking Forward Lunge  - 1 x daily - 7 x weekly - 2 sets - 10 reps - Single Leg Stance  - 1 x daily - 7 x weekly - 2 sets - 10 reps - Runner's Step Up/Down  - 1 x daily - 7 x weekly - 3 sets - 10 reps - Forward Step Down with Heel Tap and Counter Support  - 1 x daily - 7 x weekly - 3 sets - 10 reps  GOALS: Goals reviewed with patient? Yes  SHORT TERM GOALS: Target date: 11/30/2022   Pt will be ind with HEP Baseline: Goal status: PARTIALLY MET -- not consistent 11/30/22   2.  Pt will be able to perform ankle DF in standing for heel strike Baseline:  Goal status: MET 11/14/22    LONG TERM GOALS: Target date: 03/04/2023   Pt will be ind with management and progression of HEP Baseline:  Goal status: IN PROGRESS  2.  Pt will be able to demonstrate decreased hip circumduction with improved heel strike with or without R AFO for improved gait stabilty x 1000' for limited community distances Baseline: foot slap/decreased heel strike and toe off, R hip circumduction, decreased knee  flexion during swing 01/07/23: Increased heel strike with foot drop brace, R hip circumduction still present Goal status: IN PROGRESS  3.  Pt will have improved FOTO score to >/=76 Baseline: 66 01/07/23: 62 Goal status: IN PROGRESS  4.  Pt will demo improved Berg Balance to >/=52/56 to be categorized as a low fall risk Baseline: 47/56 01/07/23: 52/56 Goal status: MET  5.  Pt will have improved DGI to >/=20/24 for low fall risk Baseline:  01/07/23: 19/24 Goal status: IN PROGRESS  6.  Pt will be able to ascend/descend 12 steps with normal reciprocal pattern without hand holds for improved entry/exit of home Baseline:  01/07/23: Reciprocal pattern with hand rails for safety Goal status: IN PROGRESS  ASSESSMENT:  CLINICAL IMPRESSION: Session continues to focus on improving pt's coordination and endurance with activities. Performed to a beat to work on increasing agility and speed.   OBJECTIVE IMPAIRMENTS: Abnormal gait, decreased activity tolerance, decreased balance, decreased coordination, decreased endurance, decreased mobility, difficulty walking, decreased strength, and improper body mechanics.   PLAN:  PT FREQUENCY: 2x/week  PT DURATION: 8 weeks  PLANNED INTERVENTIONS: 97146- PT Re-evaluation, 97110-Therapeutic exercises, 97530- Therapeutic activity, [id number]- Neuromuscular re-education, 97535- Self Care, [id number]- Manual therapy, [id number]- Gait training, [phone]- Orthotic Fit/training, [phone]- Aquatic Therapy,  02966- Ionotophoresis 4mg /ml Dexamethasone , Patient/Family education, Balance training, Stair training, Taping, Cryotherapy, and Moist heat  PLAN FOR NEXT SESSION: Assess response to HEP. Lunges, multidirectional stepping/weight shifting, stairs/steps, and eccentric quad control. Ankle strengthening/control. Continue ankle strengthening. R LE strengthening, balance, and coordination. Blaze pods to a beat for coordination.    Zyere Jiminez April Ma L Ambrielle Kington, PT, DPT 02/27/2023, 3:05  PM

## 2023-02-27 NOTE — Therapy (Signed)
 OUTPATIENT OCCUPATIONAL THERAPY NEURO TREATMENT  Patient Name: James Ferrell MRN: [id number] DOB:[birth year], 48 y.o., male Today's Date: 02/27/2023  PCP: Marnie Emmie FALCON, MD REFERRING PROVIDER: Marnie Emmie FALCON, MD  END OF SESSION:  OT End of Session - 02/27/23 1403     Visit Number 8    Number of Visits 12    Date for OT Re-Evaluation 03/22/23    Authorization Type [employer] Dual complete -covered at 100%    Progress Note Due on Visit 10    OT Start Time 1400    OT Stop Time 1445    OT Time Calculation (min) 45 min    Equipment Utilized During Treatment FM Game    Activity Tolerance Patient tolerated treatment well    Behavior During Therapy [facility] for tasks assessed/performed              Past Medical History:  Diagnosis Date   CHB (complete heart block) (HCC) 2020   Endocarditis of aortic valve    Feb 2020 and April 2021   H/O mechanical aortic valve replacement    HIV (human immunodeficiency virus infection) (HCC)    Stroke [facility])    Past Surgical History:  Procedure Laterality Date   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/21/2019   With root replacement   BENTALL PROCEDURE  07/21/2019   CARDIAC VALVE SURGERY     MECHANICAL AORTIC VALVE REPLACEMENT  07/21/2019   Redo of valve surgery   PACEMAKER IMPLANT     TEE WITHOUT CARDIOVERSION N/A 05/13/2019   Procedure: TRANSESOPHAGEAL ECHOCARDIOGRAM (TEE);  Surgeon: Fernand Denyse LABOR, MD;  Location: [facility] ORS;  Service: Cardiovascular;  Laterality: N/A;   TOTAL HIP ARTHROPLASTY Right 08/30/2019   Procedure: TOTAL HIP ARTHROPLASTY ANTERIOR APPROACH;  Surgeon: Jerri Kay HERO, MD;  Location: [facility] OR;  Service: Orthopedics;  Laterality: Right;   Patient Active Problem List   Diagnosis Date Noted   Status post mechanical aortic valve replacement 08/30/2019   Closed displaced fracture of right femoral neck (HCC) 08/30/2019   HIV (human immunodeficiency virus infection) (HCC) 05/20/2019   Bacteremia due to Staphylococcus aureus  05/10/2019   Sepsis (HCC) 05/09/2019   Leukocytosis 05/09/2019   Hypotension 05/09/2019   AKI (acute kidney injury) (HCC) 05/09/2019    ONSET DATE: 01/01/2023  REFERRING DIAG: S13.26 (ICD-10-CM) - Personal history of transient ischemic attack (TIA), and cerebral infarction without residual deficits  THERAPY DIAG:  Other lack of coordination  Muscle weakness  Unsteadiness on feet  Rationale for Evaluation and Treatment: Rehabilitation  SUBJECTIVE:   SUBJECTIVE STATEMENT: No pain and no falls. 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    Per chart pt admit 6/4-6/12, went to inpatient rehab 6/12-6/28 for his CVA  PRECAUTIONS: Fall and ICD/Pacemaker, HIV +  WEIGHT BEARING RESTRICTIONS: No  PAIN:  Are you having pain? No    FALLS: Has patient fallen in last 6 months? No   LIVING ENVIRONMENT: Lives with: lives alone Lives in: 2nd floor apartment Stairs: Yes: External: 12 steps; bilateral but cannot reach both Has following equipment at home: None   PLOF: Independent w/ ADLS, on  disability d/t heart condition  PATIENT GOALS: improve coordination Rt hand  OBJECTIVE:  Note: Objective measures were completed at Evaluation unless otherwise noted.  HAND DOMINANCE: Right  ADLs: Eating: independent Grooming: 10% Rt hand, majority w/ Lt hand UB Dressing: independent LB Dressing: some difficulty with buttons and tying shoes, mod I  Toileting: independent Bathing: independent Tub Shower transfers: mod I  Equipment: Grab  bars  IADLs: Shopping: mod I (neighbor takes him) Light housekeeping: mod I  Meal Prep: mod I  Community mobility: doesn't have car yet Medication management: independent Financial management: independent Handwriting: 100% legible and Moderate micrographia  MOBILITY STATUS: Independent   FUNCTIONAL OUTCOME MEASURES: FOTO: 61 (RUE)   UPPER EXTREMITY ROM:  BUE AROM WNL's   UPPER EXTREMITY MMT:   RUE grossly 4/5  HAND FUNCTION: Grip strength: Right: 66.1 lbs; Left: 112.2 lbs  COORDINATION: 9 Hole Peg test: Right: 36.35 sec; Left: 22.26 sec  SENSATION: WFL Light touch: WFL  EDEMA: none   COGNITION: Overall cognitive status: Within functional limits for tasks assessed  VISION: Subjective report: denies change Baseline vision: No visual deficits Visual history:  none  PERCEPTION: Not tested  PRAXIS: Not tested    TODAY'S TREATMENT:                                                                                                                              DATE: 02/27/23  Assessed progress towards goals - see goal section below.   Reviewed forced use of RUE for ADLS and SAFE activities (nothing too heavy, hot, sharp) and provided handout on activities to practice with Rt dominant hand. Pt encouraged to brush teeth at least 50% with Rt hand or use electric toothbrush 100% with Rt hand  Pt practiced retrieving and replacing cones from high shelf RUE w/ 1 lb cuff weight on. Cued for correct reaching pattern and prevent sh abduction/IR  Pt placing small pegs in pegboard on vertical surface RUE for coordination, functional reaching, and visual/perceptual skills  PATIENT EDUCATION: Education details: see above Person educated: Patient Education method: Explanation, Demonstration, and Verbal cues Education comprehension: verbalized understanding, returned demonstration, verbal cues required, and needs further education  HOME EXERCISE PROGRAM: 01/07/23: coordination  HEP  01/30/23: putty and theraband HEP   02/13/23: A/E recommendations 02/15/23: affected UE strengthening/stability HEP, Access Code: [facility]   GOALS: Goals reviewed with patient? Yes  SHORT TERM GOALS: Target date: 02/22/23 (extended d/t pt unable to return again until January)   Independent with HEP for coordination Rt hand Baseline: Goal status: MET at eval  2.  Independent with putty HEP for Rt hand and theraband HEP for RUE strengthening Baseline:  Goal status: MET   LONG TERM GOALS: Target date: 03/22/23  Improve coordination as evidenced by performing 9 hole peg test in under 30 sec Baseline: 36 sec Goal status: NOT MET (36 sec)   2.  Grip strength Rt hand to increase to 71 lbs or greater  Rt hand Baseline: 66 lbs Goal status: MET (75 LBS)   3.  Pt to groom 50% with Rt dominant hand Baseline: 10% Goal status: IN Progress  4.  FOTO to increase from 61 to 65 score for RUE Baseline:  Goal status: IN Progress    ASSESSMENT:  CLINICAL IMPRESSION: Patient seen today for occupational therapy treatment for CVA. Hx includes heart issues w/ surgeries, pacemaker, and HIV+. 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  Patient encouraged to work on some modified constraint induced use of RUE to do various tasks without LUE assistance ie) hanging hangers in the closet etc   Patient requires review of HEP's for proper positioning and carryover at home. Pt would continue to benefit from skilled OT services in the outpatient setting to work on impairments as noted below to help pt return to PLOF as able.     PERFORMANCE DEFICITS: in functional skills including IADLs, coordination, strength, Fine motor control, endurance, and UE functional use.   IMPAIRMENTS: are limiting patient from IADLs.   CO-MORBIDITIES: may have co-morbidities  that affects occupational performance. Patient will benefit from  skilled OT to address above impairments and improve overall function.  MODIFICATION OR ASSISTANCE TO COMPLETE EVALUATION: No modification of tasks or assist necessary to complete an evaluation.  OT OCCUPATIONAL PROFILE AND HISTORY: Problem focused assessment: Including review of records relating to presenting problem.  CLINICAL DECISION MAKING: LOW - limited treatment options, no task modification necessary  REHAB POTENTIAL: Good  EVALUATION COMPLEXITY: Low    PLAN:  OT FREQUENCY: 1-2x/week  OT DURATION: 8 weeks  PLANNED INTERVENTIONS: 97535 self care/ADL training, 02889 therapeutic exercise, 97530 therapeutic activity, 97112 neuromuscular re-education, 97140 manual therapy, 97039 fluidotherapy, passive range of motion, energy conservation, patient/family education, and DME and/or AE instructions  RECOMMENDED OTHER SERVICES: none at this time (currently seeing P.T.)  CONSULTED AND AGREED WITH PLAN OF CARE: Patient  PLAN FOR NEXT SESSION:  Check remaining goals and d/c next session   Burnard JINNY Roads, OT 02/27/2023, 2:04 PM

## 2023-03-01 ENCOUNTER — Ambulatory Visit: Payer: 59 | Admitting: Physical Therapy

## 2023-03-01 NOTE — Therapy (Deleted)
 OUTPATIENT PHYSICAL THERAPY TREATMENT    Patient Name: James Ferrell MRN: [id number] DOB:1975/10/26, 48 y.o., male Today's Date: 03/01/2023   PCP: UNK provider REFERRING PROVIDER: Jimmye Emmie FALCON, MD  END OF SESSION:     Past Medical History:  Diagnosis Date   CHB (complete heart block) (HCC) 2020   Endocarditis of aortic valve    Feb 2020 and April 2021   H/O mechanical aortic valve replacement    HIV (human immunodeficiency virus infection) (HCC)    Stroke [facility])    Past Surgical History:  Procedure Laterality Date   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/21/2019   With root replacement   BENTALL PROCEDURE  07/21/2019   CARDIAC VALVE SURGERY     MECHANICAL AORTIC VALVE REPLACEMENT  07/21/2019   Redo of valve surgery   PACEMAKER IMPLANT     TEE WITHOUT CARDIOVERSION N/A 05/13/2019   Procedure: TRANSESOPHAGEAL ECHOCARDIOGRAM (TEE);  Surgeon: Fernand Denyse LABOR, MD;  Location: [facility] ORS;  Service: Cardiovascular;  Laterality: N/A;   TOTAL HIP ARTHROPLASTY Right 08/30/2019   Procedure: TOTAL HIP ARTHROPLASTY ANTERIOR APPROACH;  Surgeon: Jerri Kay HERO, MD;  Location: [facility] OR;  Service: Orthopedics;  Laterality: Right;   Patient Active Problem List   Diagnosis Date Noted   Status post mechanical aortic valve replacement 08/30/2019   Closed displaced fracture of right femoral neck (HCC) 08/30/2019   HIV (human immunodeficiency virus infection) (HCC) 05/20/2019   Bacteremia due to Staphylococcus aureus 05/10/2019   Sepsis (HCC) 05/09/2019   Leukocytosis 05/09/2019   Hypotension 05/09/2019   AKI (acute kidney injury) (HCC) 05/09/2019    ONSET DATE: June 2024  REFERRING DIAG: I63.9 (ICD-10-CM) - CVA (cerebral vascular accident)  THERAPY DIAG:  No diagnosis found.  Rationale for Evaluation and Treatment: Rehabilitation  SUBJECTIVE:                                                                                                                                                                                              SUBJECTIVE STATEMENT: *** Pt states he has been doing his exercises. Has been walking to music.   From eval: Pt reports he had a CVA this past June. Pt states his recovery has been going well. Pt states R LE is still weak. Pt states home health PT ended last week. Pt states balancing is still hard as well as going up/down the stairs. Wants to be able to walk better. Is interested in returning to work if able.  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   Per chart pt admit 6/4-6/12, went to inpatient rehab 6/12-6/28 for his CVA  PAIN:  Are you having pain? No  PRECAUTIONS: None  RED FLAGS: None   WEIGHT BEARING RESTRICTIONS: No  FALLS: Has patient fallen in last 6 months? No  LIVING ENVIRONMENT: Lives with: lives alone Lives in: House/apartment Stairs: Yes: External: 12 steps; bilateral but cannot reach both Has following equipment at home: None  PLOF: Independent  PATIENT GOALS: Improve mobility for stairs and gait quality  OBJECTIVE:  Note: Objective measures were completed at Evaluation unless otherwise noted.  DIAGNOSTIC FINDINGS: CTA Head 06/26/22: Partially occlusive thrombus of the left distal M1 segment.   COORDINATION: Decreased on R due to strength deficits  MUSCLE TONE: RLE: +1 with knee and hip extension, multibeat myoclonus x 5 sec with R ankle DF  LOWER EXTREMITY ROM:     Active  Right Eval Left Eval  Hip flexion    Hip extension    Hip abduction    Hip adduction    Hip internal rotation    Hip  external rotation    Knee flexion    Knee extension    Ankle dorsiflexion    Ankle plantarflexion    Ankle inversion    Ankle eversion     (Blank rows = not tested)  LOWER EXTREMITY MMT:    MMT Right Eval Left Eval  Hip flexion 5 5  Hip extension 4- 5  Hip abduction 4 4  Hip adduction    Hip internal rotation    Hip external rotation    Knee flexion 3+ 5  Knee extension 4- 5  Ankle dorsiflexion 3+ in seated unable in standing 5  Ankle plantarflexion 4 5  Ankle inversion    Ankle eversion    (Blank rows = not tested)  BED MOBILITY:  Independent  TRANSFERS: Independent  STAIRS: Level of Assistance: Modified independence Stair Negotiation Technique: Step to Pattern with Single Rail on Right Number of Stairs: 4  Height of Stairs: 6  Comments: R LE circumduction to ascend, wide BOS  GAIT: Gait pattern: Decreased R ankle DF on heel strike (foot slaps) with poor toe off, R hip circumduction during swim with decreased knee extension, decreased R LE stance time, compensatory trunk lean to R during stance phase Distance walked: >200' Assistive device utilized: None Level of assistance: Complete Independence  FUNCTIONAL TESTS:  Eval: 5x STS 12.97 sec with heavy weightshift to L LE Berg Balance Test: 47/56 DGI: 14/24  01/07/23: 5x STS 11.12 sec Berg Balance Test: 52/56 DGI: 19/24       PATIENT SURVEYS:  FOTO 66; predicted 76  VITALS  There were no vitals filed for this visit.   TODAY'S TREATMENT:       Treadmill x5 min fwd at 1.5 mph at 5% incline with two hand holds; x5 min 0% incline one hand hold 1.5 mph focusing on foot and hip control to decrease foot ER and hip circumduction  Blaze pods -- working on tapping to 70 or 80 bpm for coordination intermittent UE 3x1 min first with L LE only, then R LE only and then alternating L&R   PATIENT EDUCATION: Education details: Continue HEP, think about bracing options  Person educated: Patient Education  method: Explanation and Demonstration Education comprehension: verbalized understanding and needs further education  HOME EXERCISE PROGRAM: Access Code: [email or web address] URL: [email or web address] Date: 01/07/2023 Prepared by: Amilee Janvier April Earnie Starring  Exercises - Standing Gastroc Stretch  - 1 x daily - 7 x weekly - 2 sets - 30 sec hold - Standing Soleus Stretch  - 1 x daily - 7 x weekly - 2 sets - 30 sec hold - Walking Forward Lunge  - 1 x daily - 7 x weekly - 2 sets - 10 reps - Single Leg Stance  - 1 x daily - 7 x weekly - 2 sets - 10 reps - Runner's Step Up/Down  - 1 x daily - 7 x weekly - 3 sets - 10 reps - Forward Step Down with Heel Tap and Counter Support  - 1 x daily - 7 x weekly - 3 sets - 10 reps  GOALS: Goals reviewed with patient? Yes  SHORT TERM GOALS: Target date: 11/30/2022   Pt will be ind with HEP Baseline: Goal status: PARTIALLY MET -- not consistent 11/30/22   2.  Pt will be able to perform ankle DF in standing for heel strike Baseline:  Goal status: MET 11/14/22    LONG TERM GOALS: Target date: 03/04/2023   Pt will be ind with management and progression of HEP Baseline:  Goal status: IN PROGRESS  2.  Pt will be able to demonstrate decreased hip circumduction with improved heel strike with or without R AFO for improved gait stabilty x 1000' for limited community distances Baseline: foot slap/decreased heel strike and toe off, R hip circumduction, decreased knee flexion during swing 01/07/23: Increased heel strike with foot drop brace, R hip circumduction still present Goal status: IN PROGRESS  3.  Pt will have improved FOTO score to >/=76 Baseline: 66 01/07/23: 62 Goal status: IN PROGRESS  4.  Pt will demo improved Berg Balance to >/=52/56 to be categorized as a low fall risk Baseline: 47/56 01/07/23: 52/56 Goal status: MET  5.  Pt will have improved DGI to >/=20/24 for low fall risk Baseline:  01/07/23: 19/24 Goal status: IN  PROGRESS  6.  Pt will be able to ascend/descend 12 steps with normal reciprocal pattern without hand holds for improved entry/exit of home Baseline:  01/07/23: Reciprocal pattern with hand rails for safety Goal status: IN PROGRESS  ASSESSMENT:  CLINICAL IMPRESSION: Session continues to focus on improving pt's coordination and endurance with activities. Performed to a beat to work on increasing agility and speed.   OBJECTIVE IMPAIRMENTS: Abnormal gait, decreased activity tolerance, decreased balance, decreased coordination, decreased endurance, decreased mobility, difficulty walking, decreased strength, and improper body mechanics.   PLAN:  PT FREQUENCY: 2x/week  PT DURATION: 8 weeks  PLANNED INTERVENTIONS: [id number]- PT Re-evaluation, 97110-Therapeutic exercises, 97530- Therapeutic activity, [id number]- Neuromuscular re-education, 97535- Self Care, [id number]- Manual therapy, [phone]- Gait training, [phone]- Orthotic Fit/training, [phone]- Aquatic Therapy, [phone]- Ionotophoresis 4mg /ml Dexamethasone , Patient/Family education, Balance training, Stair training, Taping, Cryotherapy, and Moist heat  PLAN FOR NEXT SESSION: Assess response to HEP. Lunges, multidirectional stepping/weight shifting, stairs/steps, and eccentric quad control. Ankle strengthening/control. Continue ankle strengthening. R LE strengthening, balance, and coordination. Blaze pods to a beat for coordination.    Vadis Slabach April Ma L Tykwon Fera, PT, DPT 03/01/2023, 10:28 AM

## 2023-03-06 ENCOUNTER — Ambulatory Visit: Payer: 59 | Admitting: Physical Therapy

## 2023-03-06 ENCOUNTER — Ambulatory Visit: Payer: 59 | Admitting: Occupational Therapy

## 2023-03-06 ENCOUNTER — Encounter: Payer: Self-pay | Admitting: Occupational Therapy

## 2023-03-06 DIAGNOSIS — R2689 Other abnormalities of gait and mobility: Secondary | ICD-10-CM

## 2023-03-06 DIAGNOSIS — R2681 Unsteadiness on feet: Secondary | ICD-10-CM

## 2023-03-06 DIAGNOSIS — R278 Other lack of coordination: Secondary | ICD-10-CM

## 2023-03-06 DIAGNOSIS — M6281 Muscle weakness (generalized): Secondary | ICD-10-CM

## 2023-03-06 NOTE — Unmapped (Signed)
[place] [facility]  UNREACHABLE    Warren Encinas. is a 48 y.o. male identified as possibly having a proportion of days covered of less than 80%  for a hyperlipidemia medication, based on payer reports and chart review.       1st attempt call made to the patient's Cell number(s). There was no answer & voicemail was left to return call.       Population:  [employer]    Medication(s):  Cholesterol: Atorvastatin (Lipitor) 80mg  (High Intensity)   Last Filled on 09/17/2022 for a 90 day supply  Refills remaining:YES      Additional Details & Actions Taken:   Chronic Adherence Outreach and Text message sent      Aldona Bar , CPhT  Certified Pharmacy Technician  [place] [facility]  Phone: [phone]

## 2023-03-06 NOTE — Therapy (Signed)
OUTPATIENT OCCUPATIONAL THERAPY NEURO TREATMENT/DISCHARGE   Patient Name: James Ferrell MRN: [id number] DOB:1975-08-17, 48 y.o., male Today's Date: 03/06/2023  PCP: Dorris Carnes, MD REFERRING PROVIDER: Dorris Carnes, MD  OCCUPATIONAL THERAPY DISCHARGE SUMMARY  Visits from Start of Care: 9  Current functional level related to goals / functional outcomes: SEE BELOW   Remaining deficits: RUE coordination and strength   Education / Equipment: HEP's (see below) and A/E recommendations   Patient agrees to discharge. Patient goals were met. Patient is being discharged due to meeting the stated rehab goals..     END OF SESSION:  OT End of Session - 03/06/23 1401     Visit Number 9    Number of Visits 12    Date for OT Re-Evaluation 03/22/23    Authorization Type [employer] Dual complete -covered at 100%    Progress Note Due on Visit 10    OT Start Time 1400    OT Stop Time 1445    OT Time Calculation (min) 45 min    Equipment Utilized During Treatment FM Game    Activity Tolerance Patient tolerated treatment well    Behavior During Therapy [facility] for tasks assessed/performed              Past Medical History:  Diagnosis Date   CHB (complete heart block) (HCC) 2020   Endocarditis of aortic valve    Feb 2020 and April 2021   H/O mechanical aortic valve replacement    HIV (human immunodeficiency virus infection) (HCC)    Stroke [facility])    Past Surgical History:  Procedure Laterality Date   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/21/2019   With root replacement   BENTALL PROCEDURE  07/21/2019   CARDIAC VALVE SURGERY     MECHANICAL AORTIC VALVE REPLACEMENT  07/21/2019   Redo of valve surgery   PACEMAKER IMPLANT     TEE WITHOUT CARDIOVERSION N/A 05/13/2019   Procedure: TRANSESOPHAGEAL ECHOCARDIOGRAM (TEE);  Surgeon: Laurier Nancy, MD;  Location: [facility] ORS;  Service: Cardiovascular;  Laterality: N/A;   TOTAL HIP ARTHROPLASTY Right 08/30/2019   Procedure: TOTAL HIP  ARTHROPLASTY ANTERIOR APPROACH;  Surgeon: Tarry Kos, MD;  Location: [facility] OR;  Service: Orthopedics;  Laterality: Right;   Patient Active Problem List   Diagnosis Date Noted   Status post mechanical aortic valve replacement 08/30/2019   Closed displaced fracture of right femoral neck (HCC) 08/30/2019   HIV (human immunodeficiency virus infection) (HCC) 05/20/2019   Bacteremia due to Staphylococcus aureus 05/10/2019   Sepsis (HCC) 05/09/2019   Leukocytosis 05/09/2019   Hypotension 05/09/2019   AKI (acute kidney injury) (HCC) 05/09/2019    ONSET DATE: 01/01/2023  REFERRING DIAG: R67.89 (ICD-10-CM) - Personal history of transient ischemic attack (TIA), and cerebral infarction without residual deficits  THERAPY DIAG:  Other lack of coordination  Muscle weakness  Unsteadiness on feet  Muscle weakness (generalized)  Rationale for Evaluation and Treatment: Rehabilitation  SUBJECTIVE:   SUBJECTIVE STATEMENT: Pt reports now brushing his teeth more (at least 50%) with Rt hand  Pt accompanied by: self  PERTINENT HISTORY: HIV, 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    Per chart pt admit 6/4-6/12, went to inpatient  rehab 6/12-6/28 for his CVA  PRECAUTIONS: Fall and ICD/Pacemaker, HIV +  WEIGHT BEARING RESTRICTIONS: No  PAIN:  Are you having pain? No    FALLS: Has patient fallen in last 6 months? No   LIVING ENVIRONMENT: Lives with: lives alone Lives in: 2nd floor apartment Stairs: Yes: External: 12 steps; bilateral but cannot reach both Has following equipment at home: None   PLOF: Independent w/ ADLS, on  disability d/t heart condition  PATIENT GOALS: improve coordination Rt hand  OBJECTIVE:  Note: Objective measures were completed at Evaluation unless otherwise noted.  HAND DOMINANCE: Right  ADLs: Eating: independent Grooming: 10% Rt hand, majority w/ Lt hand UB Dressing: independent LB Dressing: some difficulty with buttons and tying shoes, mod I  Toileting: independent Bathing: independent Tub Shower transfers: mod I  Equipment: Grab bars  IADLs: Shopping: mod I (neighbor takes him) Light housekeeping: mod I  Meal Prep: mod I  Community mobility: doesn't have car yet Medication management: independent Financial management: independent Handwriting: 100% legible and Moderate micrographia  MOBILITY STATUS: Independent   FUNCTIONAL OUTCOME MEASURES: FOTO: 61 (RUE)   UPPER EXTREMITY ROM:  BUE AROM WNL's   UPPER EXTREMITY MMT:   RUE grossly 4/5  HAND FUNCTION: Grip strength: Right: 66.1 lbs; Left: 112.2 lbs  COORDINATION: 9 Hole Peg test: Right: 36.35 sec; Left: 22.26 sec  SENSATION: WFL Light touch: WFL  EDEMA: none   COGNITION: Overall cognitive status: Within functional limits for tasks assessed  VISION: Subjective report: denies change Baseline vision: No visual deficits Visual history:  none  PERCEPTION: Not tested  PRAXIS: Not tested    TODAY'S TREATMENT:                                                                                                                              DATE: 03/06/23  Assessed remaining goals - see goal section below. Pt's FOTO score improved from 61 to 77  Pt/therapist playing Connect 4 for RUE functional use, coordination, visual perceptual skills, problem solving skills and min cues to use Rt dominant hand. Pt improved in strategy each game  Pt completed Slide Color & Shape Matching Puzzles. This required pt to scan for appropriately colored piece, recognize the appropriate color or shape, and process where the  piece needed to be positioned while manipulating the pieces to place them according to the pattern.   UBE x 8 min, level 5 for normal reciprocal movement pattern and UB conditioning     PATIENT EDUCATION: Education details: see above Person educated: Patient Education method: Explanation, Demonstration, and Verbal cues Education comprehension: verbalized understanding, returned demonstration, verbal cues required, and needs further education  HOME EXERCISE PROGRAM: 01/07/23: coordination HEP  01/30/23: putty and theraband HEP   02/13/23: A/E recommendations 02/15/23: affected UE strengthening/stability HEP, Access Code: [facility]   GOALS: Goals reviewed with patient? Yes  SHORT TERM GOALS: Target date: 02/22/23 (extended d/t pt unable to return again until January)  Independent with HEP for coordination Rt hand Baseline: Goal status: MET at eval  2.  Independent with putty HEP for Rt hand and theraband HEP for RUE strengthening Baseline:  Goal status: MET   LONG TERM GOALS: Target date: 03/22/23  Improve coordination as evidenced by performing 9 hole peg test in under 30 sec Baseline: 36 sec Goal status: NOT MET (36 sec)   2.  Grip strength Rt hand to increase to 71 lbs or greater Rt hand Baseline: 66 lbs Goal status: MET (75 LBS)   3.  Pt to groom 50% with Rt dominant hand Baseline: 10% Goal status: MET  4.  FOTO to increase from 61 to 65 score for RUE Baseline:  Goal status: MET (77 score)     ASSESSMENT:  CLINICAL IMPRESSION: Patient has met all STG's and 3/4 LTG's. Pt has improved significantly with RUE functional use and grip strength. Pt occasionally will automatically revert back to using LUE but corrects himself more.    PERFORMANCE DEFICITS: in functional skills including IADLs, coordination, strength, Fine motor control, endurance, and UE functional use.   IMPAIRMENTS: are limiting patient from IADLs.   CO-MORBIDITIES: may have co-morbidities  that  affects occupational performance. Patient will benefit from skilled OT to address above impairments and improve overall function.  MODIFICATION OR ASSISTANCE TO COMPLETE EVALUATION: No modification of tasks or assist necessary to complete an evaluation.  OT OCCUPATIONAL PROFILE AND HISTORY: Problem focused assessment: Including review of records relating to presenting problem.  CLINICAL DECISION MAKING: LOW - limited treatment options, no task modification necessary  REHAB POTENTIAL: Good  EVALUATION COMPLEXITY: Low    PLAN:  OT FREQUENCY: 1-2x/week  OT DURATION: 8 weeks  PLANNED INTERVENTIONS: 97535 self care/ADL training, 16109 therapeutic exercise, 97530 therapeutic activity, 97112 neuromuscular re-education, 97140 manual therapy, 97039 fluidotherapy, passive range of motion, energy conservation, patient/family education, and DME and/or AE instructions  RECOMMENDED OTHER SERVICES: none at this time (currently seeing P.T.)  CONSULTED AND AGREED WITH PLAN OF CARE: Patient  PLAN  D/C O.T.   Sheran Lawless, OT 03/06/2023, 2:01 PM

## 2023-03-06 NOTE — Therapy (Signed)
OUTPATIENT PHYSICAL THERAPY TREATMENT    Patient Name: James Ferrell MRN: [id number] DOB:18-May-1975, 48 y.o., male Today's Date: 03/06/2023   PCP: Lucienne Minks provider REFERRING PROVIDER: Idelia Salm, MD  END OF SESSION:  PT End of Session - 03/06/23 1314     Visit Number 24    Date for PT Re-Evaluation 03/04/23    Authorization Type UHC Medicare    PT Start Time 1315    PT Stop Time 1355    PT Time Calculation (min) 40 min    Activity Tolerance Patient tolerated treatment well    Behavior During Therapy [facility] for tasks assessed/performed             Past Medical History:  Diagnosis Date   CHB (complete heart block) (HCC) 2020   Endocarditis of aortic valve    Feb 2020 and April 2021   H/O mechanical aortic valve replacement    HIV (human immunodeficiency virus infection) (HCC)    Stroke [facility])    Past Surgical History:  Procedure Laterality Date   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/21/2019   With root replacement   BENTALL PROCEDURE  07/21/2019   CARDIAC VALVE SURGERY     MECHANICAL AORTIC VALVE REPLACEMENT  07/21/2019   Redo of valve surgery   PACEMAKER IMPLANT     TEE WITHOUT CARDIOVERSION N/A 05/13/2019   Procedure: TRANSESOPHAGEAL ECHOCARDIOGRAM (TEE);  Surgeon: Laurier Nancy, MD;  Location: [facility] ORS;  Service: Cardiovascular;  Laterality: N/A;   TOTAL HIP ARTHROPLASTY Right 08/30/2019   Procedure: TOTAL HIP ARTHROPLASTY ANTERIOR APPROACH;  Surgeon: Tarry Kos, MD;  Location: [facility] OR;  Service: Orthopedics;  Laterality: Right;   Patient Active Problem List   Diagnosis Date Noted   Status post mechanical aortic valve replacement 08/30/2019   Closed displaced fracture of right femoral neck (HCC) 08/30/2019   HIV (human immunodeficiency virus infection) (HCC) 05/20/2019   Bacteremia due to Staphylococcus aureus 05/10/2019   Sepsis (HCC) 05/09/2019   Leukocytosis 05/09/2019   Hypotension 05/09/2019   AKI (acute kidney injury) (HCC) 05/09/2019     ONSET DATE: June 2024  REFERRING DIAG: I63.9 (ICD-10-CM) - CVA (cerebral vascular accident)  THERAPY DIAG:  Other lack of coordination  Muscle weakness  Unsteadiness on feet  Muscle weakness (generalized)  Other abnormalities of gait and mobility  Rationale for Evaluation and Treatment: Rehabilitation  SUBJECTIVE:                                                                                                                                                                                             SUBJECTIVE STATEMENT: Pt reports compliance to  HEP. Has continued to walk and improve his gait speed.   From eval: Pt reports he had a CVA this past June. Pt states his recovery has been going well. Pt states R LE is still weak. Pt states home health PT ended last week. Pt states balancing is still hard as well as going up/down the stairs. Wants to be able to walk better. Is interested in returning to work if able.  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   Per chart pt admit 6/4-6/12, went to inpatient rehab 6/12-6/28 for his CVA  PAIN:  Are you having pain? No  PRECAUTIONS: None  RED FLAGS: None   WEIGHT BEARING RESTRICTIONS: No  FALLS: Has patient fallen in last 6 months? No  LIVING ENVIRONMENT: Lives with: lives alone Lives in: House/apartment Stairs: Yes: External: 12 steps; bilateral but cannot reach both Has following equipment at home: None  PLOF: Independent  PATIENT GOALS: Improve mobility for stairs and gait quality  OBJECTIVE:  Note: Objective  measures were completed at Evaluation unless otherwise noted.  DIAGNOSTIC FINDINGS: CTA Head 06/26/22: Partially occlusive thrombus of the left distal M1 segment.   COORDINATION: Decreased on R due to strength deficits  MUSCLE TONE: RLE: +1 with knee and hip extension, multibeat myoclonus x 5 sec with R ankle DF  LOWER EXTREMITY ROM:     Active  Right Eval Left Eval  Hip flexion    Hip extension    Hip abduction    Hip adduction    Hip internal rotation    Hip external rotation    Knee flexion    Knee extension    Ankle dorsiflexion    Ankle plantarflexion    Ankle inversion    Ankle eversion     (Blank rows = not tested)  LOWER EXTREMITY MMT:    MMT Right Eval Left Eval Right 2/12  Hip flexion 5 5 5   Hip extension 4- 5 5  Hip abduction 4 4 4+  Hip adduction     Hip internal rotation     Hip external rotation     Knee flexion 3+ 5 5  Knee extension 4- 5 5  Ankle dorsiflexion 3+ in seated unable in standing 5 5  Ankle plantarflexion 4 5   Ankle inversion     Ankle eversion     (Blank rows = not tested)  BED MOBILITY:  Independent  TRANSFERS: Independent  STAIRS: Level of Assistance: Modified independence Stair Negotiation Technique: Step to Pattern with Single Rail on Right Number of Stairs: 4  Height of Stairs: 6"  Comments: R LE circumduction to ascend, wide BOS  GAIT: Gait pattern: Decreased R ankle DF on heel strike (foot slaps) with poor toe off, R hip circumduction during swim with decreased knee extension, decreased R LE stance time, compensatory trunk lean to R during stance phase Distance walked: >200' Assistive device utilized: None Level of assistance: Complete Independence  FUNCTIONAL TESTS:  Eval: 5x STS 12.97 sec with heavy weightshift to L LE Berg Balance Test: 47/56 DGI: 14/24  01/07/23: 5x STS 11.12 sec Berg Balance Test: 52/56 DGI: 19/24  03/06/23: DGI: 23/24 6 min  walk test: 1005'  [facility] PT Assessment - 03/06/23 0001        Dynamic Gait Index   Level Surface Mild Impairment    Change in Gait Speed Normal    Gait with Horizontal Head Turns Normal    Gait with Vertical Head Turns Normal    Gait and Pivot Turn Normal    Step Over Obstacle Normal    Step Around Obstacles Normal    Steps Normal    Total Score 23               PATIENT SURVEYS:  FOTO 66; predicted 76  (eval) FOTO 03/06/23: 63  VITALS  There were no vitals filed for this visit.   TODAY'S TREATMENT:       Heel raise knee straight 2x10 Heel raise knee bent 2x10 Amb with reciprocal gait and alternating UE swing Small hops fwd 5x10' Rechecked LTGs   PATIENT EDUCATION: Education details: HEP updates and modifications Person educated: Patient Education method: Explanation and Demonstration Education comprehension: verbalized understanding and needs further education  HOME EXERCISE PROGRAM: Access Code: [email or web address] URL: [email or web address] Date: 01/07/2023 Prepared by: Vernon Prey April Kirstie Peri  Exercises - Standing Gastroc Stretch  - 1 x daily - 7 x weekly - 2 sets - 30 sec hold - Standing Soleus Stretch  - 1 x daily - 7 x weekly - 2 sets - 30 sec hold - Walking Forward Lunge  - 1 x daily - 7 x weekly - 2 sets - 10 reps - Single Leg Stance  - 1 x daily - 7 x weekly - 2 sets - 10 reps - Runner's Step Up/Down  - 1 x daily - 7 x weekly - 3 sets - 10 reps - Forward Step Down with Heel Tap and Counter Support  - 1 x daily - 7 x weekly - 3 sets - 10 reps  GOALS: Goals reviewed with patient? Yes  SHORT TERM GOALS: Target date: 11/30/2022   Pt will be ind with HEP Baseline: Goal status: PARTIALLY MET -- not consistent 11/30/22   2.  Pt will be able to perform ankle DF in standing for heel strike Baseline:  Goal status: MET 11/14/22    LONG TERM GOALS: Target date: 03/04/2023  Pt will be ind with management and progression of HEP Baseline:  Goal status: IN PROGRESS  2.  Pt will be able to demonstrate  decreased hip circumduction with improved heel strike with or without R AFO for improved gait stabilty x 1000' for limited community distances Baseline: foot slap/decreased heel strike and toe off, R hip circumduction, decreased knee flexion during swing 01/07/23: Increased heel strike with foot drop brace, R hip circumduction still present 03/06/23: >1000' in 6 min with improving heel strike Goal status: MET  3.  Pt will have improved FOTO score to >/=76 Baseline: 66 01/07/23: 62 03/05/22: 63 Goal status: IN PROGRESS  4.  Pt will demo improved Berg Balance to >/=52/56 to be categorized as a low fall risk Baseline: 47/56 01/07/23: 52/56 Goal status: MET  5.  Pt will have improved DGI to >/=20/24 for low fall risk Baseline:  01/07/23: 19/24 03/06/23: 23/24 Goal status: MET  6.  Pt will be able to ascend/descend 12 steps with normal reciprocal pattern without hand holds for improved entry/exit of home Baseline:  01/07/23: Reciprocal pattern with hand rails for safety 03/06/23: Reciprocal pattern without HHA Goal status: MET  REVISED LONG TERM GOALS: Target date: 04/17/2023  Pt will be ind  with management and progression of HEP Baseline:  Goal status: IN PROGRESS  2. Pt will be able to run/jog x 100' to catch up to someone/return to recreational activities Baseline:  Goal status: NEW  3. Pt will be able to amb without foot drop brace x 1000' with normal reciprocal gait for improved community amb Baseline:  Goal status: NEW  4. Pt will have improved FOTO score to >/=76 Baseline: 66 01/07/23: 62 03/05/22: 63 Goal status: IN PROGRESS   ASSESSMENT:  CLINICAL IMPRESSION: Session focused on rechecking pt's goals. Pt has greatly improved his strength ( see MMT above) and has met almost all of his LTGs except his FOTO score. Readjusted goals and created new ones to reflect his improvements. Pt's gait quality and endurance has showed progress but still has some incoordination with  swinging his UEs and some tone noticeable during toe off. Pt now able to hop with some R LE lag/drag. Discussed with pt that he has made good improvements but could still continue to get increased return in his function. Will drop to 1x/wk with pt encouraged to work on his HEP at home.   OBJECTIVE IMPAIRMENTS: Abnormal gait, decreased activity tolerance, decreased balance, decreased coordination, decreased endurance, decreased mobility, difficulty walking, decreased strength, and improper body mechanics.   PLAN:  PT FREQUENCY: 2x/week  PT DURATION: 8 weeks  PLANNED INTERVENTIONS: 97146- PT Re-evaluation, 97110-Therapeutic exercises, 97530- Therapeutic activity, [id number]- Neuromuscular re-education, 97535- Self Care, [id number]- Manual therapy, [phone]- Gait training, [phone]- Orthotic Fit/training, [phone]- Aquatic Therapy, [phone]- Ionotophoresis 4mg /ml Dexamethasone, Patient/Family education, Balance training, Stair training, Taping, Cryotherapy, and Moist heat  PLAN FOR NEXT SESSION: Assess response to HEP. Hopping, strengthening, coordination (ex. Blaze pods to a beat for coordination).    Abdulahad Mederos April Ma L Dallin Mccorkel, PT, DPT 03/06/2023, 2:04 PM

## 2023-03-08 ENCOUNTER — Ambulatory Visit: Payer: 59 | Admitting: Physical Therapy

## 2023-03-13 ENCOUNTER — Ambulatory Visit: Payer: 59 | Admitting: Physical Therapy

## 2023-03-20 ENCOUNTER — Ambulatory Visit: Payer: 59 | Admitting: Physical Therapy

## 2023-03-20 ENCOUNTER — Ambulatory Visit: Admit: 2023-03-20 | Discharge: 2023-03-21 | Payer: MEDICARE

## 2023-03-20 DIAGNOSIS — Z45018 Encounter for adjustment and management of other part of cardiac pacemaker: Principal | ICD-10-CM

## 2023-03-20 DIAGNOSIS — R2681 Unsteadiness on feet: Secondary | ICD-10-CM

## 2023-03-20 DIAGNOSIS — R278 Other lack of coordination: Secondary | ICD-10-CM | POA: Diagnosis not present

## 2023-03-20 DIAGNOSIS — M6281 Muscle weakness (generalized): Secondary | ICD-10-CM

## 2023-03-20 DIAGNOSIS — R2689 Other abnormalities of gait and mobility: Secondary | ICD-10-CM

## 2023-03-20 NOTE — Unmapped (Signed)
 Attempted to reach patient regarding the disconnected phone app heart monitor.   When calling the only phone listed for the patient, we get a recording that states your request can not be processed.   Patient is NOT on MyChart.   Letter sent regarding disconnection.

## 2023-03-20 NOTE — Therapy (Signed)
 OUTPATIENT PHYSICAL THERAPY TREATMENT    Patient Name: James Ferrell MRN: [id number] DOB:[birth year], 48 y.o., male Today's Date: 03/20/2023   PCP: Lucienne Minks provider REFERRING PROVIDER: Idelia Salm, MD  END OF SESSION:  PT End of Session - 03/20/23 1255     Visit Number 25    Date for PT Re-Evaluation 04/17/23    Authorization Type UHC Medicare    PT Start Time 1300    PT Stop Time 1340    PT Time Calculation (min) 40 min    Activity Tolerance Patient tolerated treatment well    Behavior During Therapy [facility] for tasks assessed/performed             Past Medical History:  Diagnosis Date   CHB (complete heart block) (HCC) 2020   Endocarditis of aortic valve    Feb 2020 and April 2021   H/O mechanical aortic valve replacement    HIV (human immunodeficiency virus infection) (HCC)    Stroke [facility])    Past Surgical History:  Procedure Laterality Date   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/21/2019   With root replacement   BENTALL PROCEDURE  07/21/2019   CARDIAC VALVE SURGERY     MECHANICAL AORTIC VALVE REPLACEMENT  07/21/2019   Redo of valve surgery   PACEMAKER IMPLANT     TEE WITHOUT CARDIOVERSION N/A 05/13/2019   Procedure: TRANSESOPHAGEAL ECHOCARDIOGRAM (TEE);  Surgeon: Laurier Nancy, MD;  Location: [facility] ORS;  Service: Cardiovascular;  Laterality: N/A;   TOTAL HIP ARTHROPLASTY Right 08/30/2019   Procedure: TOTAL HIP ARTHROPLASTY ANTERIOR APPROACH;  Surgeon: Tarry Kos, MD;  Location: [facility] OR;  Service: Orthopedics;  Laterality: Right;   Patient Active Problem List   Diagnosis Date Noted   Status post mechanical aortic valve replacement 08/30/2019   Closed displaced fracture of right femoral neck (HCC) 08/30/2019   HIV (human immunodeficiency virus infection) (HCC) 05/20/2019   Bacteremia due to Staphylococcus aureus 05/10/2019   Sepsis (HCC) 05/09/2019   Leukocytosis 05/09/2019   Hypotension 05/09/2019   AKI (acute kidney injury) (HCC) 05/09/2019     ONSET DATE: June 2024  REFERRING DIAG: I63.9 (ICD-10-CM) - CVA (cerebral vascular accident)  THERAPY DIAG:  No diagnosis found.  Rationale for Evaluation and Treatment: Rehabilitation  SUBJECTIVE:                                                                                                                                                                                             SUBJECTIVE STATEMENT: Pt states he has been practicing his hopping.   From eval: Pt reports he had a CVA this past June. Pt states  his recovery has been going well. Pt states R LE is still weak. Pt states home health PT ended last week. Pt states balancing is still hard as well as going up/down the stairs. Wants to be able to walk better. Is interested in returning to work if able.  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   Per chart pt admit 6/4-6/12, went to inpatient rehab 6/12-6/28 for his CVA  PAIN:  Are you having pain? No  PRECAUTIONS: None  RED FLAGS: None   WEIGHT BEARING RESTRICTIONS: No  FALLS: Has patient fallen in last 6 months? No  LIVING ENVIRONMENT: Lives with: lives alone Lives in: House/apartment Stairs: Yes: External: 12 steps; bilateral but cannot reach both Has following equipment at home: None  PLOF: Independent  PATIENT GOALS: Improve mobility for stairs and gait quality  OBJECTIVE:  Note: Objective measures were completed at Evaluation unless otherwise noted.  DIAGNOSTIC FINDINGS: CTA Head 06/26/22: Partially occlusive thrombus of the left distal M1 segment.    COORDINATION: Decreased on R due to strength deficits  MUSCLE TONE: RLE: +1 with knee and hip extension, multibeat myoclonus x 5 sec with R ankle DF  LOWER EXTREMITY ROM:     Active  Right Eval Left Eval  Hip flexion    Hip extension    Hip abduction    Hip adduction    Hip internal rotation    Hip external rotation    Knee flexion    Knee extension    Ankle dorsiflexion    Ankle plantarflexion    Ankle inversion    Ankle eversion     (Blank rows = not tested)  LOWER EXTREMITY MMT:    MMT Right Eval Left Eval Right 2/12  Hip flexion 5 5 5   Hip extension 4- 5 5  Hip abduction 4 4 4+  Hip adduction     Hip internal rotation     Hip external rotation     Knee flexion 3+ 5 5  Knee extension 4- 5 5  Ankle dorsiflexion 3+ in seated unable in standing 5 5  Ankle plantarflexion 4 5   Ankle inversion     Ankle eversion     (Blank rows = not tested)  BED MOBILITY:  Independent  TRANSFERS: Independent  STAIRS: Level of Assistance: Modified independence Stair Negotiation Technique: Step to Pattern with Single Rail on Right Number of Stairs: 4  Height of Stairs: 6"  Comments: R LE circumduction to ascend, wide BOS  GAIT: Gait pattern: Decreased R ankle DF on heel strike (foot slaps) with poor toe off, R hip circumduction during swim with decreased knee extension, decreased R LE stance time, compensatory trunk lean to R during stance phase Distance walked: >200' Assistive device utilized: None Level of assistance: Complete Independence  FUNCTIONAL TESTS:  Eval: 5x STS 12.97 sec with heavy weightshift to L LE Berg Balance Test: 47/56 DGI: 14/24  01/07/23: 5x STS 11.12 sec Berg Balance Test: 52/56 DGI: 19/24  03/06/23: DGI: 23/24 6 min walk test: 1005'      PATIENT SURVEYS:  FOTO 66; predicted 76  (eval) FOTO 03/06/23: 63  VITALS  There were  no vitals filed for this visit.   TODAY'S TREATMENT:       Treadmill 1.8 mph x 2 min, 2 mph x 3 min  (can start to see some spasticity with increased speed and time) Agility ladder fwd double leg hops Agility ladder side steps  Agility ladder side hops Amb x2 laps around gym no drop foot brace, cues for arm swing Blaze pods in a circle, pt touching pods with his feet to a beat for coordination  PATIENT EDUCATION: Education details: HEP updates and modifications Person educated: Patient Education method: Medical illustrator Education comprehension: verbalized understanding and needs further education  HOME EXERCISE PROGRAM: Access Code: [email or web address] URL: [email or web address] Date: 01/07/2023 Prepared by: Vernon Prey April Kirstie Peri  Exercises - Standing Gastroc Stretch  - 1 x daily - 7 x weekly - 2 sets - 30 sec hold - Standing Soleus Stretch  - 1 x daily - 7 x weekly - 2 sets - 30 sec hold - Walking Forward Lunge  - 1 x daily - 7 x weekly - 2 sets - 10 reps - Single Leg Stance  - 1 x daily - 7 x weekly - 2 sets - 10 reps - Runner's Step Up/Down  - 1 x daily - 7 x weekly - 3 sets - 10 reps - Forward Step Down with Heel Tap and Counter Support  - 1 x daily - 7 x weekly - 3 sets - 10 reps  GOALS: Goals reviewed with patient? Yes  SHORT TERM GOALS: Target date: 11/30/2022   Pt will be ind with HEP Baseline: Goal status: PARTIALLY MET -- not consistent 11/30/22   2.  Pt will be able to perform ankle DF in standing for heel strike Baseline:  Goal status: MET 11/14/22    REVISED LONG TERM GOALS: Target date: 04/17/2023  Pt will be ind with management and progression of HEP Baseline:  Goal status: IN PROGRESS  2. Pt will be able to run/jog x 100' to catch up to someone/return to recreational activities Baseline:  Goal status: NEW  3. Pt will be able to amb without foot drop brace x 1000' with normal reciprocal gait for improved community amb Baseline:  Goal status: NEW  4. Pt will have improved FOTO score to >/=76 Baseline: 66 01/07/23: 62 03/05/22:  63 Goal status: IN PROGRESS   ASSESSMENT:  CLINICAL IMPRESSION: Treatment focused on continuing to progress plyometrics. Pt remains limited by increased tone/spasticity with R LE fatigue. Still has some difficulty with coordinating arm swing during gait. Able to tolerate increased gait speed on treadmill with good equal step lengths, however can also notice spasticity and tone with increased time at higher speed.  OBJECTIVE IMPAIRMENTS: Abnormal gait, decreased activity tolerance, decreased balance, decreased coordination, decreased endurance, decreased mobility, difficulty walking, decreased strength, and improper body mechanics.   PLAN:  PT FREQUENCY: 2x/week  PT DURATION: 8 weeks  PLANNED INTERVENTIONS: 97146- PT Re-evaluation, 97110-Therapeutic exercises, 97530- Therapeutic activity, [id number]- Neuromuscular re-education, 97535- Self Care, 87564- Manual therapy, [phone]- Gait training, [phone]- Orthotic Fit/training, [phone]- Aquatic Therapy, [phone]- Ionotophoresis 4mg /ml Dexamethasone, Patient/Family education, Balance training, Stair training, Taping, Cryotherapy, and Moist heat  PLAN FOR NEXT SESSION: Assess response to HEP. Hopping, strengthening, coordination (ex. Blaze pods to a beat for coordination). Amb with increased gait speed with good arm swing coordination   Oreoluwa Aigner April Ma L [place], PT, DPT 03/20/2023, 12:57 PM

## 2023-03-21 NOTE — Unmapped (Signed)
 Imani, RN is [facility] for patient.     Drafted and sent an out of care letter in efforts to get the patient back into care.     [facility] Care Plan: Out of Care Patient (>6 months)    Patient's last ID clinic appt: 10/11/2022    Goal: Patient will have an office visit within 6 months of the last office visit unless stated otherwise by their provider.     Contact Interventions may include:  Placing phone calls to the patient and/or listed contacts.  Sending the patient an Out of Care text message through the [employer] or [facility] cell phone (if texting agreement is signed).  Sending the patient an Out of Care MyChart/letter.  Contacting the patient's most recent pharmacies as needed for additional information such as contact information or refill history.  Contacting the patient's case manager/caregiver as needed for additional information, if assigned.  Researching other online resources as needed.     If the patient is reached, the [name] will attempt to:  Update all contact information including primary and secondary phone numbers and emergency contact details and address. Confirm access to MyChart.  Complete the Out of Care Intervention questionnaire with the patient.  Explore and discuss any barriers to visit attendance the patient may be experiencing that result in missed appointments, no-shows, cancellations, or lack of a scheduled follow-up appointment. These may include communication barriers (language, phone access, texting, MyChart), transportation, financial concerns, time off work, mental health/substance use, and/or multiple medical appointments.  Make appropriate referrals and link the patient to clinic nurses, social workers, benefits counselors, or other support services in the [facility] ID clinic to address concerns or barriers to care.  Schedule a follow-up appointment at an appropriate interval based on a completed assessment.  Confirm the patient has access to ART medications until that appointment. If the patient is not currently taking ART, needs refills, or is having difficulty taking the medication, refer to the clinical staff for follow up via [employer].    Monitor attendance of the patient's scheduled appointment and provide reminder calls/texts/messages consistent with patient preference, if needed.  Continue follow-up if indicated.    Expected Outcome:  Patient will have an office visit within 6 months of the last office visit unless stated otherwise by their provider.    If the patient has an urgent medical problem send a chat message to the clinic nursing staff for immediate follow-up     **If the patient is not able to be located or retained in HIV care, a referral will be placed to the [facility] HIV [facility] Counselor for further follow-up.    Duration of Service: 15 minutes    Kenyatta Keidel  Linkage and Firefighter Infectious [facility]

## 2023-03-25 ENCOUNTER — Ambulatory Visit: Admit: 2023-03-25 | Payer: MEDICARE

## 2023-03-25 ENCOUNTER — Ambulatory Visit: Payer: 59 | Attending: Internal Medicine | Admitting: Physical Therapy

## 2023-03-25 NOTE — Unmapped (Signed)
 [facility] Nursing Attempt       TYPE OF ENCOUNTER: Text    REASON: Pt. Has an appointment with this ID provider today    ADHERENCE: Unable to assess    ASSESSMENT: see text below    RN: [name], will we see you for your visit today?    No response     INTERVENTION: None at this time     PLAN: RN will continue to reach out to the patient     DURATION: 1 min

## 2023-03-25 NOTE — Unmapped (Signed)
 [facility] Nursing Attempt      TYPE OF ENCOUNTER: Phone    REASON: Check in     ADHERENCE: Unable to assess    ASSESSMENT: RN contacted the patient to find out how he was doing. There was no answer at the time of the call. The automatic message states,  The request cannot be processed    INTERVENTION: None at this time     PLAN: RN will continue to try and follow up with the patient     DURATION: 1 min

## 2023-03-25 NOTE — Unmapped (Signed)
 [facility] Nursing Attempt     TYPE OF ENCOUNTER: Text     REASON: RN contacted the patient to find out if he was able to contact the pharmacy.     ADHERENCE: Unable to assess    ASSESSMENT: See below:    RN: Thea Silversmith, dont forget to call the pharmacy    No response from the patient      INTERVENTION: None at this time    PLAN: Will continue to reach out to the patient    DURATION: 1 min

## 2023-03-25 NOTE — Unmapped (Addendum)
 [facility] Nursing Attempt     TYPE OF ENCOUNTER: Text/Phone    REASON: Follow Up     ADHERENCE: Unable to assess    ASSESSMENT: RN called the patient to check and find out how he was doing. The call did not go through and there was a message stating , Your call could not be completed, that repeated over and over. RN sent a text:     RN: Warren [name]! [employer], how are you? What's going on?    PLAN: RN will continue to try and engage patient in care     No response from the patient     DURATION: 1 min

## 2023-03-25 NOTE — Unmapped (Signed)
 [facility] Nursing Attempt     TYPE OF ENCOUNTER: Text     REASON: Follow Up/Check in     ADHERENCE: Unable to assess    ASSESSMENT:See text below:    RN: Remi Haggard, this is Cheri Ayotte are you ok?    DURATION: 1 min

## 2023-04-03 ENCOUNTER — Ambulatory Visit: Payer: 59 | Admitting: Physical Therapy

## 2023-04-03 NOTE — Unmapped (Addendum)
 Attempted to call patient for patient's re-engagement in care.  Stated call was unsuccessful.     Sent text message to patient to re-engage in care via [employer] texting app.    Note:Coordinating with Francisca December, RN for [name]. MCM reached out to [name]. Patient been getting PT and OT there. Patient showed to their last two appointments with them, but has appointments coming up next week and the week after. If patient shows up there, ImanI asked [facility] that they let the patient know that [name] is trying to reach him     [facility] Care Plan: Out of Care Patient (>6 months)    Patient's last ID clinic appt: 10/11/2022     Goal: Patient will have an office visit within 6 months of the last office visit unless stated otherwise by their provider.     Contact Interventions may include:  Placing phone calls to the patient and/or listed contacts.  Sending the patient an Out of Care text message through the [employer] App or [facility] cell phone (if texting agreement is signed).  Sending the patient an Out of Care MyChart/letter.  Contacting the patient's most recent pharmacies as needed for additional information such as contact information or refill history.  Contacting the patient's case manager/caregiver as needed for additional information, if assigned.  Researching other online resources as needed.     If the patient is reached, the [name] will attempt to:  Update all contact information including primary and secondary phone numbers and emergency contact details and address. Confirm access to MyChart.  Complete the Out of Care Intervention questionnaire with the patient.  Explore and discuss any barriers to visit attendance the patient may be experiencing that result in missed appointments, no-shows, cancellations, or lack of a scheduled follow-up appointment. These may include communication barriers (language, phone access, texting, MyChart), transportation, financial concerns, time off work, mental health/substance use, and/or multiple medical appointments.  Make appropriate referrals and link the patient to clinic nurses, social workers, benefits counselors, or other support services in the [name] to address concerns or barriers to care.  Schedule a follow-up appointment at an appropriate interval based on a completed assessment.  Confirm the patient has access to ART medications until that appointment. If the patient is not currently taking ART, needs refills, or is having difficulty taking the medication, refer to the clinical staff for follow up via [employer].    Monitor attendance of the patient's scheduled appointment and provide reminder calls/texts/messages consistent with patient preference, if needed.  Continue follow-up if indicated.    Expected Outcome:  Patient will have an office visit within 6 months of the last office visit unless stated otherwise by their provider.    If the patient has an urgent medical problem send a chat message to the clinic nursing staff for immediate follow-up     **If the patient is not able to be located or retained in HIV care, a referral will be placed to the [name] for further follow-up.    Duration of Service: 10 minutes    Warren [name]  Linkage and Firefighter [facility]

## 2023-04-08 NOTE — Unmapped (Signed)
 [facility] Nursing Attempt     TYPE OF ENCOUNTER: Phone    REASON: Out of Care.     ADHERENCE: Unable to assess at this time- Pharmacy has not been able to reach the patient to set up medication delivery.     ASSESSMENT: Neither the pharmacy nor this RN have been able to reach the patient. He has no showed both appointments with his PCP and ID provider. However, Per Epic, the patient has been seen by PT and OT at [facility].     INTERVENTION: RN contacted the Occupational Therapist, Jene Every, at [facility]. Tresa Endo states that she had recently discharged the patient from OT services because he had been doing so well. He is still supposed to see PT, but has missed multiple appointments He has appointments scheduled on 3/19 and 3/26. Tresa Endo states that she will send a note to the patient's physical therapist, letting her know that if the patient comes in for a visit, to please let him know that [facility] is trying to contact him.     PLAN: RN will continue to try and follow up with the patient     DURATION: 5 min

## 2023-04-10 ENCOUNTER — Telehealth: Payer: Self-pay | Admitting: Physical Therapy

## 2023-04-10 ENCOUNTER — Ambulatory Visit: Payer: 59 | Admitting: Physical Therapy

## 2023-04-10 NOTE — Therapy (Deleted)
 OUTPATIENT PHYSICAL THERAPY TREATMENT    Patient Name: James Ferrell MRN: [id number] DOB:17-Aug-1975, 48 y.o., male Today's Date: 04/10/2023   PCP: Lucienne Minks provider REFERRING PROVIDER: Idelia Salm, MD  END OF SESSION:    Past Medical History:  Diagnosis Date   CHB (complete heart block) (HCC) 2020   Endocarditis of aortic valve    Feb 2020 and April 2021   H/O mechanical aortic valve replacement    HIV (human immunodeficiency virus infection) (HCC)    Stroke [facility])    Past Surgical History:  Procedure Laterality Date   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/21/2019   With root replacement   BENTALL PROCEDURE  07/21/2019   CARDIAC VALVE SURGERY     MECHANICAL AORTIC VALVE REPLACEMENT  07/21/2019   Redo of valve surgery   PACEMAKER IMPLANT     TEE WITHOUT CARDIOVERSION N/A 05/13/2019   Procedure: TRANSESOPHAGEAL ECHOCARDIOGRAM (TEE);  Surgeon: Laurier Nancy, MD;  Location: [facility] ORS;  Service: Cardiovascular;  Laterality: N/A;   TOTAL HIP ARTHROPLASTY Right 08/30/2019   Procedure: TOTAL HIP ARTHROPLASTY ANTERIOR APPROACH;  Surgeon: Tarry Kos, MD;  Location: [facility] OR;  Service: Orthopedics;  Laterality: Right;   Patient Active Problem List   Diagnosis Date Noted   Status post mechanical aortic valve replacement 08/30/2019   Closed displaced fracture of right femoral neck (HCC) 08/30/2019   HIV (human immunodeficiency virus infection) (HCC) 05/20/2019   Bacteremia due to Staphylococcus aureus 05/10/2019   Sepsis (HCC) 05/09/2019   Leukocytosis 05/09/2019   Hypotension 05/09/2019   AKI (acute kidney injury) (HCC) 05/09/2019    ONSET DATE: June 2024  REFERRING DIAG: I63.9 (ICD-10-CM) - CVA (cerebral vascular accident)  THERAPY DIAG:  No diagnosis found.  Rationale for Evaluation and Treatment: Rehabilitation  SUBJECTIVE:                                                                                                                                                                                              SUBJECTIVE STATEMENT: *** Pt states he has been practicing his hopping.   From eval: Pt reports he had a CVA this past June. Pt states his recovery has been going well. Pt states R LE is still weak. Pt states home health PT ended last week. Pt states balancing is still hard as well as going up/down the stairs. Wants to be able to walk better. Is interested in returning to work if able.  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   Per chart pt admit 6/4-6/12, went to inpatient rehab 6/12-6/28 for his CVA  PAIN:  Are you having pain? No  PRECAUTIONS: None  RED FLAGS: None   WEIGHT BEARING RESTRICTIONS: No  FALLS: Has patient fallen in last 6 months? No  LIVING ENVIRONMENT: Lives with: lives alone Lives in: House/apartment Stairs: Yes: External: 12 steps; bilateral but cannot reach both Has following equipment at home: None  PLOF: Independent  PATIENT GOALS: Improve mobility for stairs and gait quality  OBJECTIVE:  Note: Objective measures were completed at Evaluation unless otherwise noted.  DIAGNOSTIC FINDINGS: CTA Head 06/26/22: Partially occlusive thrombus of the left distal M1 segment.   COORDINATION: Decreased on R due to strength deficits  MUSCLE TONE: RLE: +1 with knee and hip extension, multibeat myoclonus x 5 sec with R ankle DF  LOWER EXTREMITY ROM:     Active  Right Eval Left Eval  Hip flexion    Hip extension    Hip abduction    Hip adduction    Hip internal rotation    Hip external rotation    Knee  flexion    Knee extension    Ankle dorsiflexion    Ankle plantarflexion    Ankle inversion    Ankle eversion     (Blank rows = not tested)  LOWER EXTREMITY MMT:    MMT Right Eval Left Eval Right 2/12  Hip flexion 5 5 5   Hip extension 4- 5 5  Hip abduction 4 4 4+  Hip adduction     Hip internal rotation     Hip external rotation     Knee flexion 3+ 5 5  Knee extension 4- 5 5  Ankle dorsiflexion 3+ in seated unable in standing 5 5  Ankle plantarflexion 4 5   Ankle inversion     Ankle eversion     (Blank rows = not tested)  BED MOBILITY:  Independent  TRANSFERS: Independent  STAIRS: Level of Assistance: Modified independence Stair Negotiation Technique: Step to Pattern with Single Rail on Right Number of Stairs: 4  Height of Stairs: 6"  Comments: R LE circumduction to ascend, wide BOS  GAIT: Gait pattern: Decreased R ankle DF on heel strike (foot slaps) with poor toe off, R hip circumduction during swim with decreased knee extension, decreased R LE stance time, compensatory trunk lean to R during stance phase Distance walked: >200' Assistive device utilized: None Level of assistance: Complete Independence  FUNCTIONAL TESTS:  Eval: 5x STS 12.97 sec with heavy weightshift to L LE Berg Balance Test: 47/56 DGI: 14/24  01/07/23: 5x STS 11.12 sec Berg Balance Test: 52/56 DGI: 19/24  03/06/23: DGI: 23/24 6 min walk test: 1005'      PATIENT SURVEYS:  FOTO 66; predicted 76  (eval) FOTO 03/06/23: 63  VITALS  There were no vitals filed for this visit.   TODAY'S TREATMENT:       *** Treadmill 1.8 mph x 2 min, 2 mph x 3 min (can start to see some spasticity with increased speed and time)  Agility ladder fwd double leg hops Agility ladder side steps  Agility ladder side hops Amb x2 laps around gym no drop foot brace, cues for arm swing Blaze pods in a circle, pt touching pods with his feet to  a beat for coordination  PATIENT EDUCATION: Education  details: HEP updates and modifications Person educated: Patient Education method: Explanation and Demonstration Education comprehension: verbalized understanding and needs further education  HOME EXERCISE PROGRAM: Access Code: [email or web address] URL: [email or web address] Date: 01/07/2023 Prepared by: Vernon Prey April Kirstie Peri  Exercises - Standing Gastroc Stretch  - 1 x daily - 7 x weekly - 2 sets - 30 sec hold - Standing Soleus Stretch  - 1 x daily - 7 x weekly - 2 sets - 30 sec hold - Walking Forward Lunge  - 1 x daily - 7 x weekly - 2 sets - 10 reps - Single Leg Stance  - 1 x daily - 7 x weekly - 2 sets - 10 reps - Runner's Step Up/Down  - 1 x daily - 7 x weekly - 3 sets - 10 reps - Forward Step Down with Heel Tap and Counter Support  - 1 x daily - 7 x weekly - 3 sets - 10 reps  GOALS: Goals reviewed with patient? Yes  SHORT TERM GOALS: Target date: 11/30/2022   Pt will be ind with HEP Baseline: Goal status: PARTIALLY MET -- not consistent 11/30/22   2.  Pt will be able to perform ankle DF in standing for heel strike Baseline:  Goal status: MET 11/14/22    REVISED LONG TERM GOALS: Target date: 04/17/2023  Pt will be ind with management and progression of HEP Baseline:  Goal status: IN PROGRESS  2. Pt will be able to run/jog x 100' to catch up to someone/return to recreational activities Baseline:  Goal status: NEW  3. Pt will be able to amb without foot drop brace x 1000' with normal reciprocal gait for improved community amb Baseline:  Goal status: NEW  4. Pt will have improved FOTO score to >/=76 Baseline: 66 01/07/23: 62 03/05/22: 63 Goal status: IN PROGRESS   ASSESSMENT:  CLINICAL IMPRESSION: *** Treatment focused on continuing to progress plyometrics. Pt remains limited by increased tone/spasticity with R LE fatigue. Still has some difficulty with coordinating arm swing during gait. Able to tolerate increased gait speed on treadmill with good equal  step lengths, however can also notice spasticity and tone with increased time at higher speed.  OBJECTIVE IMPAIRMENTS: Abnormal gait, decreased activity tolerance, decreased balance, decreased coordination, decreased endurance, decreased mobility, difficulty walking, decreased strength, and improper body mechanics.   PLAN:  PT FREQUENCY: 2x/week  PT DURATION: 8 weeks  PLANNED INTERVENTIONS: 97146- PT Re-evaluation, 97110-Therapeutic exercises, 97530- Therapeutic activity, [id number]- Neuromuscular re-education, 97535- Self Care, [id number]- Manual therapy, [phone]- Gait training, [phone]- Orthotic Fit/training, [phone]- Aquatic Therapy, [phone]- Ionotophoresis 4mg /ml Dexamethasone, Patient/Family education, Balance training, Stair training, Taping, Cryotherapy, and Moist heat  PLAN FOR NEXT SESSION: Assess response to HEP. Hopping, strengthening, coordination (ex. Blaze pods to a beat for coordination). Amb with increased gait speed with good arm swing coordination   Rene Sizelove April Ma L Tomales, PT, DPT 04/10/2023, 8:37 AM

## 2023-04-10 NOTE — Telephone Encounter (Signed)
 Called pt in regards to missed visit. Unable to leave voicemail as "call cannot be completed."   [place] April Dell Ponto, PT

## 2023-04-11 NOTE — Unmapped (Signed)
 Sent text message to patient to re-engage in care via [employer] texting app.    Note: If patient shows up to [facility] appt, ImanI asked [facility] that they let the patient know that [facility] is trying to reach him      [facility] Care Plan: Out of Care Patient (>6 months)     Patient's last ID clinic appt: 10/11/2022      Goal: Patient will have an office visit within 6 months of the last office visit unless stated otherwise by their provider.     Contact Interventions may include:  Placing phone calls to the patient and/or listed contacts.  Sending the patient an Out of Care text message through the [employer] App or [facility] cell phone (if texting agreement is signed).  Sending the patient an Out of Care MyChart/letter.  Contacting the patient's most recent pharmacies as needed for additional information such as contact information or refill history.  Contacting the patient's case manager/caregiver as needed for additional information, if assigned.  Researching other online resources as needed.     If the patient is reached, the [name] will attempt to:  Update all contact information including primary and secondary phone numbers and emergency contact details and address. Confirm access to MyChart.  Complete the Out of Care Intervention questionnaire with the patient.  Explore and discuss any barriers to visit attendance the patient may be experiencing that result in missed appointments, no-shows, cancellations, or lack of a scheduled follow-up appointment. These may include communication barriers (language, phone access, texting, MyChart), transportation, financial concerns, time off work, mental health/substance use, and/or multiple medical appointments.  Make appropriate referrals and link the patient to clinic nurses, social workers, benefits counselors, or other support services in the [facility] ID clinic to address concerns or barriers to care.  Schedule a follow-up appointment at an appropriate interval based on a completed assessment.  Confirm the patient has access to ART medications until that appointment. If the patient is not currently taking ART, needs refills, or is having difficulty taking the medication, refer to the clinical staff for follow up via [employer].    Monitor attendance of the patient's scheduled appointment and provide reminder calls/texts/messages consistent with patient preference, if needed.  Continue follow-up if indicated.     Expected Outcome:  Patient will have an office visit within 6 months of the last office visit unless stated otherwise by their provider.     If the patient has an urgent medical problem send a chat message to the clinic nursing staff for immediate follow-up     **If the patient is not able to be located or retained in HIV care, a referral will be placed to the [facility] HIV [facility] Counselor for further follow-up.     Duration of Service: 10 minutes     Warren [name]  Linkage and Firefighter Infectious Diseases[facility]

## 2023-04-16 NOTE — Unmapped (Signed)
 [facility] Nursing Attempt     TYPE OF ENCOUNTER: Email     REASON: Needs an appointment/ Out of care    ADHERENCE: Unable to assess at this time     ASSESSMENT: RN has not been able to reach the patient via phone or text. RN sent an email to the patient stating the following:    RN: [name], Its Lama Narayanan from [employer] office. I wasn't sure what other way to reach out to you. I haven't heard from you in a while, Is everything ok? We are concerned. Please respond. I can also set you up with a visit .    No response from the patient     INTERVENTION: N/a    PLAN:n/a    DURATION: 1 min
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
## 2023-04-17 ENCOUNTER — Ambulatory Visit: Payer: 59 | Admitting: Physical Therapy

## 2023-04-17 NOTE — Therapy (Deleted)
 OUTPATIENT PHYSICAL THERAPY TREATMENT    Patient Name: James Ferrell MRN: [id number] DOB:06-Apr-1975, 48 y.o., male Today's Date: 04/17/2023   PCP: Lucienne Minks provider REFERRING PROVIDER: Idelia Salm, MD  END OF SESSION:    Past Medical History:  Diagnosis Date   CHB (complete heart block) (HCC) 2020   Endocarditis of aortic valve    Feb 2020 and April 2021   H/O mechanical aortic valve replacement    HIV (human immunodeficiency virus infection) (HCC)    Stroke [facility])    Past Surgical History:  Procedure Laterality Date   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/21/2019   With root replacement   BENTALL PROCEDURE  07/21/2019   CARDIAC VALVE SURGERY     MECHANICAL AORTIC VALVE REPLACEMENT  07/21/2019   Redo of valve surgery   PACEMAKER IMPLANT     TEE WITHOUT CARDIOVERSION N/A 05/13/2019   Procedure: TRANSESOPHAGEAL ECHOCARDIOGRAM (TEE);  Surgeon: Laurier Nancy, MD;  Location: [facility] ORS;  Service: Cardiovascular;  Laterality: N/A;   TOTAL HIP ARTHROPLASTY Right 08/30/2019   Procedure: TOTAL HIP ARTHROPLASTY ANTERIOR APPROACH;  Surgeon: Tarry Kos, MD;  Location: [facility] OR;  Service: Orthopedics;  Laterality: Right;   Patient Active Problem List   Diagnosis Date Noted   Status post mechanical aortic valve replacement 08/30/2019   Closed displaced fracture of right femoral neck (HCC) 08/30/2019   HIV (human immunodeficiency virus infection) (HCC) 05/20/2019   Bacteremia due to Staphylococcus aureus 05/10/2019   Sepsis (HCC) 05/09/2019   Leukocytosis 05/09/2019   Hypotension 05/09/2019   AKI (acute kidney injury) (HCC) 05/09/2019    ONSET DATE: June 2024  REFERRING DIAG: I63.9 (ICD-10-CM) - CVA (cerebral vascular accident)  THERAPY DIAG:  No diagnosis found.  Rationale for Evaluation and Treatment: Rehabilitation  SUBJECTIVE:                                                                                                                                                                                              SUBJECTIVE STATEMENT: *** Pt states he has been practicing his hopping.   From eval: Pt reports he had a CVA this past June. Pt states his recovery has been going well. Pt states R LE is still weak. Pt states home health PT ended last week. Pt states balancing is still hard as well as going up/down the stairs. Wants to be able to walk better. Is interested in returning to work if able.  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   Per chart pt admit 6/4-6/12, went to inpatient rehab 6/12-6/28 for his CVA  PAIN:  Are you having pain? No  PRECAUTIONS: None  RED FLAGS: None   WEIGHT BEARING RESTRICTIONS: No  FALLS: Has patient fallen in last 6 months? No  LIVING ENVIRONMENT: Lives with: lives alone Lives in: House/apartment Stairs: Yes: External: 12 steps; bilateral but cannot reach both Has following equipment at home: None  PLOF: Independent  PATIENT GOALS: Improve mobility for stairs and gait quality  OBJECTIVE:  Note: Objective measures were completed at Evaluation unless otherwise noted.  DIAGNOSTIC FINDINGS: CTA Head 06/26/22: Partially occlusive thrombus of the left distal M1 segment.   COORDINATION: Decreased on R due to strength deficits  MUSCLE TONE: RLE: +1 with knee and hip extension, multibeat myoclonus x 5 sec with R ankle DF  LOWER EXTREMITY ROM:     Active  Right Eval Left Eval  Hip flexion    Hip extension    Hip abduction    Hip adduction    Hip internal rotation    Hip external rotation    Knee  flexion    Knee extension    Ankle dorsiflexion    Ankle plantarflexion    Ankle inversion    Ankle eversion     (Blank rows = not tested)  LOWER EXTREMITY MMT:    MMT Right Eval Left Eval Right 2/12  Hip flexion 5 5 5   Hip extension 4- 5 5  Hip abduction 4 4 4+  Hip adduction     Hip internal rotation     Hip external rotation     Knee flexion 3+ 5 5  Knee extension 4- 5 5  Ankle dorsiflexion 3+ in seated unable in standing 5 5  Ankle plantarflexion 4 5   Ankle inversion     Ankle eversion     (Blank rows = not tested)  BED MOBILITY:  Independent  TRANSFERS: Independent  STAIRS: Level of Assistance: Modified independence Stair Negotiation Technique: Step to Pattern with Single Rail on Right Number of Stairs: 4  Height of Stairs: 6"  Comments: R LE circumduction to ascend, wide BOS  GAIT: Gait pattern: Decreased R ankle DF on heel strike (foot slaps) with poor toe off, R hip circumduction during swim with decreased knee extension, decreased R LE stance time, compensatory trunk lean to R during stance phase Distance walked: >200' Assistive device utilized: None Level of assistance: Complete Independence  FUNCTIONAL TESTS:  Eval: 5x STS 12.97 sec with heavy weightshift to L LE Berg Balance Test: 47/56 DGI: 14/24  01/07/23: 5x STS 11.12 sec Berg Balance Test: 52/56 DGI: 19/24  03/06/23: DGI: 23/24 6 min walk test: 1005'      PATIENT SURVEYS:  FOTO 66; predicted 76  (eval) FOTO 03/06/23: 63  VITALS  There were no vitals filed for this visit.   TODAY'S TREATMENT:       *** Treadmill 1.8 mph x 2 min, 2 mph x 3 min (can start to see some spasticity with increased speed and time)  Agility ladder fwd double leg hops Agility ladder side steps  Agility ladder side hops Amb x2 laps around gym no drop foot brace, cues for arm swing Blaze pods in a circle, pt touching pods with his feet to  a beat for coordination  PATIENT EDUCATION: Education  details: HEP updates and modifications Person educated: Patient Education method: Explanation and Demonstration Education comprehension: verbalized understanding and needs further education  HOME EXERCISE PROGRAM: Access Code: [email or web address] URL: [email or web address] Date: 01/07/2023 Prepared by: Vernon Prey April Kirstie Peri  Exercises - Standing Gastroc Stretch  - 1 x daily - 7 x weekly - 2 sets - 30 sec hold - Standing Soleus Stretch  - 1 x daily - 7 x weekly - 2 sets - 30 sec hold - Walking Forward Lunge  - 1 x daily - 7 x weekly - 2 sets - 10 reps - Single Leg Stance  - 1 x daily - 7 x weekly - 2 sets - 10 reps - Runner's Step Up/Down  - 1 x daily - 7 x weekly - 3 sets - 10 reps - Forward Step Down with Heel Tap and Counter Support  - 1 x daily - 7 x weekly - 3 sets - 10 reps  GOALS: Goals reviewed with patient? Yes  SHORT TERM GOALS: Target date: 11/30/2022   Pt will be ind with HEP Baseline: Goal status: PARTIALLY MET -- not consistent 11/30/22   2.  Pt will be able to perform ankle DF in standing for heel strike Baseline:  Goal status: MET 11/14/22    REVISED LONG TERM GOALS: Target date: 04/17/2023  Pt will be ind with management and progression of HEP Baseline:  Goal status: IN PROGRESS  2. Pt will be able to run/jog x 100' to catch up to someone/return to recreational activities Baseline:  Goal status: NEW  3. Pt will be able to amb without foot drop brace x 1000' with normal reciprocal gait for improved community amb Baseline:  Goal status: NEW  4. Pt will have improved FOTO score to >/=76 Baseline: 66 01/07/23: 62 03/05/22: 63 Goal status: IN PROGRESS   ASSESSMENT:  CLINICAL IMPRESSION: *** Treatment focused on continuing to progress plyometrics. Pt remains limited by increased tone/spasticity with R LE fatigue. Still has some difficulty with coordinating arm swing during gait. Able to tolerate increased gait speed on treadmill with good equal  step lengths, however can also notice spasticity and tone with increased time at higher speed.  OBJECTIVE IMPAIRMENTS: Abnormal gait, decreased activity tolerance, decreased balance, decreased coordination, decreased endurance, decreased mobility, difficulty walking, decreased strength, and improper body mechanics.   PLAN:  PT FREQUENCY: 2x/week  PT DURATION: 8 weeks  PLANNED INTERVENTIONS: 97146- PT Re-evaluation, 97110-Therapeutic exercises, 97530- Therapeutic activity, [id number]- Neuromuscular re-education, 97535- Self Care, [id number]- Manual therapy, [phone]- Gait training, [phone]- Orthotic Fit/training, [phone]- Aquatic Therapy, [phone]- Ionotophoresis 4mg /ml Dexamethasone, Patient/Family education, Balance training, Stair training, Taping, Cryotherapy, and Moist heat  PLAN FOR NEXT SESSION: Assess response to HEP. Hopping, strengthening, coordination (ex. Blaze pods to a beat for coordination). Amb with increased gait speed with good arm swing coordination   Kimiyo Carmicheal April Ma L Bantam, PT, DPT 04/17/2023, 12:13 PM

## 2023-04-19 NOTE — Unmapped (Signed)
 [facility] Referral Notice:      Attempts to reach patient to re-engage in ID clinic care have not been successful. Patient is considered out of care from the ID clinic.   Patient referred to [facility] to confirm or assist in engagement in HIV care to assist in re-engagement in HIV care.    Informed the appropriate member(s) of the patient's care team.      [facility] Notes: Past of being highly detectable; on our end, no meds picked up since 01/04/2023; [facility] informed patient has history on paranoia; patient asked [facility] to not reach out to emergency contact Aldean Ast)   Last HIV Viral Load: 10/11/2022 (<200 copies/mL)  Last [facility] visit: 10/11/2022     [facility] Care Plan: Out of Care Patient (>6 months)    Patient's last ID clinic appt: 10/11/2022    Goal: Patient will have an office visit within 6 months of the last office visit unless stated otherwise by their provider.     Contact Interventions may include:  Placing phone calls to the patient and/or listed contacts.  Sending the patient an Out of Care text message through the [employer] [facility] or [facility] cell phone (if texting agreement is signed).  Sending the patient an Out of Care MyChart/letter.  Contacting the patient's most recent pharmacies as needed for additional information such as contact information or refill history.  Contacting the patient's case manager/caregiver as needed for additional information, if assigned.  Researching other online resources as needed.     If the patient is reached, the [name] will attempt to:  Update all contact information including primary and secondary phone numbers and emergency contact details and address. Confirm access to MyChart.  Complete the Out of Care Intervention questionnaire with the patient.  Explore and discuss any barriers to visit attendance the patient may be experiencing that result in missed appointments, no-shows, cancellations, or lack of a scheduled follow-up appointment. These may include communication barriers (language, phone access, texting, MyChart), transportation, financial concerns, time off work, mental health/substance use, and/or multiple medical appointments.  Make appropriate referrals and link the patient to clinic nurses, social workers, benefits counselors, or other support services in the [facility] ID clinic to address concerns or barriers to care.  Schedule a follow-up appointment at an appropriate interval based on a completed assessment.  Confirm the patient has access to ART medications until that appointment. If the patient is not currently taking ART, needs refills, or is having difficulty taking the medication, refer to the clinical staff for follow up via [employer].    Monitor attendance of the patient's scheduled appointment and provide reminder calls/texts/messages consistent with patient preference, if needed.  Continue follow-up if indicated.    Expected Outcome:  Patient will have an office visit within 6 months of the last office visit unless stated otherwise by their provider.    If the patient has an urgent medical problem send a chat message to the clinic nursing staff for immediate follow-up     **If the patient is not able to be located or retained in HIV care, a referral will be placed to the [facility] Counselor for further follow-up.    Duration of Service: 15 minutes    Shaiden Aldous  Linkage and Firefighter Infectious Diseases-[place]

## 2023-05-30 NOTE — Unmapped (Signed)
 [facility] Nursing Attempt    TYPE OF ENCOUNTER: Phone    REASON: Appointment    ADHERENCE: Unable to assess    ASSESSMENT: Attempted to contact the patient, message states  this number is not in service    INTERVENTION:N/A    PLAN:N/a    DURATION: 1 min

## 2023-06-27 NOTE — Unmapped (Signed)
 Specialty Medication(s): Biktarvy     Mr.Droessler has been dis-enrolled from the [facility] specialty pharmacy services as a result of multiple unsuccessful outreach attempts by the pharmacy.    Additional information provided to the patient: last filled 01/04/23 (30 day supply)    Riesa Chary, PharmD  [facility] Specialty Pharmacist
